# Patient Record
Sex: Female | Born: 1935 | Race: White | Hispanic: No | Smoking: Former smoker
Health system: Southern US, Community
[De-identification: ages and names within clinical notes are randomized; demographics above are authoritative.]

## PROBLEM LIST (undated history)

## (undated) DIAGNOSIS — I639 Cerebral infarction, unspecified: Secondary | ICD-10-CM

## (undated) DIAGNOSIS — I4891 Unspecified atrial fibrillation: Secondary | ICD-10-CM

## (undated) DIAGNOSIS — I513 Intracardiac thrombosis, not elsewhere classified: Secondary | ICD-10-CM

## (undated) DIAGNOSIS — J40 Bronchitis, not specified as acute or chronic: Secondary | ICD-10-CM

## (undated) DIAGNOSIS — I700 Atherosclerosis of aorta: Secondary | ICD-10-CM

## (undated) DIAGNOSIS — I2510 Atherosclerotic heart disease of native coronary artery without angina pectoris: Secondary | ICD-10-CM

## (undated) DIAGNOSIS — C50919 Malignant neoplasm of unspecified site of unspecified female breast: Secondary | ICD-10-CM

## (undated) DIAGNOSIS — R3581 Nocturnal polyuria: Secondary | ICD-10-CM

## (undated) HISTORY — DX: Cerebral infarction, unspecified: I63.9

## (undated) HISTORY — DX: Atherosclerosis of aorta: I70.0

## (undated) HISTORY — DX: Intracardiac thrombosis, not elsewhere classified: I51.3

## (undated) HISTORY — DX: Malignant neoplasm of unspecified site of unspecified female breast: C50.919

## (undated) HISTORY — DX: Unspecified atrial fibrillation: I48.91

## (undated) HISTORY — DX: Atherosclerotic heart disease of native coronary artery without angina pectoris: I25.10

---

## 1965-01-06 HISTORY — PX: TOTAL ABDOMINAL HYSTERECTOMY: SHX209

## 1999-01-07 HISTORY — PX: BREAST LUMPECTOMY: SHX2

## 2017-01-06 HISTORY — PX: FEMUR FRACTURE SURGERY: SHX633

## 2018-01-30 DIAGNOSIS — N3020 Other chronic cystitis without hematuria: Secondary | ICD-10-CM | POA: Insufficient documentation

## 2018-02-17 ENCOUNTER — Other Ambulatory Visit (HOSPITAL_COMMUNITY): Payer: Self-pay | Admitting: *Deleted

## 2018-02-18 ENCOUNTER — Encounter (HOSPITAL_COMMUNITY): Payer: Self-pay

## 2018-03-02 ENCOUNTER — Encounter (HOSPITAL_COMMUNITY): Payer: Self-pay

## 2018-03-05 ENCOUNTER — Other Ambulatory Visit (HOSPITAL_COMMUNITY): Payer: Self-pay | Admitting: *Deleted

## 2018-03-08 ENCOUNTER — Ambulatory Visit (HOSPITAL_COMMUNITY)
Admission: RE | Admit: 2018-03-08 | Discharge: 2018-03-08 | Disposition: A | Discharge status: Home / Self Care | Payer: Medicare Other | Source: Ambulatory Visit | Attending: Internal Medicine | Admitting: Internal Medicine

## 2018-03-08 DIAGNOSIS — M810 Age-related osteoporosis without current pathological fracture: Secondary | ICD-10-CM | POA: Insufficient documentation

## 2018-03-08 MED ORDER — DENOSUMAB 60 MG/ML ~~LOC~~ SOSY
PREFILLED_SYRINGE | SUBCUTANEOUS | Status: AC
  Administered 2018-03-08: 60 mg via SUBCUTANEOUS
  Filled 2018-03-08: qty 1

## 2018-03-08 MED ORDER — DENOSUMAB 60 MG/ML ~~LOC~~ SOSY
60.0000 mg | PREFILLED_SYRINGE | Freq: Once | SUBCUTANEOUS | Status: AC
  Administered 2018-03-08: 60 mg via SUBCUTANEOUS

## 2018-06-16 ENCOUNTER — Other Ambulatory Visit: Payer: Self-pay

## 2018-06-16 ENCOUNTER — Encounter (INDEPENDENT_AMBULATORY_CARE_PROVIDER_SITE_OTHER): Payer: Medicare Other | Admitting: Ophthalmology

## 2018-06-16 DIAGNOSIS — I10 Essential (primary) hypertension: Secondary | ICD-10-CM

## 2018-06-16 DIAGNOSIS — H35033 Hypertensive retinopathy, bilateral: Secondary | ICD-10-CM

## 2018-06-16 DIAGNOSIS — H43813 Vitreous degeneration, bilateral: Secondary | ICD-10-CM

## 2018-06-16 DIAGNOSIS — H353231 Exudative age-related macular degeneration, bilateral, with active choroidal neovascularization: Secondary | ICD-10-CM | POA: Diagnosis not present

## 2018-07-16 ENCOUNTER — Other Ambulatory Visit: Payer: Self-pay

## 2018-07-16 ENCOUNTER — Encounter (INDEPENDENT_AMBULATORY_CARE_PROVIDER_SITE_OTHER): Payer: Medicare Other | Admitting: Ophthalmology

## 2018-07-16 DIAGNOSIS — H353231 Exudative age-related macular degeneration, bilateral, with active choroidal neovascularization: Secondary | ICD-10-CM | POA: Diagnosis not present

## 2018-07-16 DIAGNOSIS — H35033 Hypertensive retinopathy, bilateral: Secondary | ICD-10-CM | POA: Diagnosis not present

## 2018-07-16 DIAGNOSIS — H43813 Vitreous degeneration, bilateral: Secondary | ICD-10-CM | POA: Diagnosis not present

## 2018-07-16 DIAGNOSIS — I10 Essential (primary) hypertension: Secondary | ICD-10-CM

## 2018-08-06 DIAGNOSIS — J300 Vasomotor rhinitis: Secondary | ICD-10-CM | POA: Insufficient documentation

## 2018-08-09 DIAGNOSIS — M1711 Unilateral primary osteoarthritis, right knee: Secondary | ICD-10-CM | POA: Insufficient documentation

## 2018-08-13 ENCOUNTER — Encounter (INDEPENDENT_AMBULATORY_CARE_PROVIDER_SITE_OTHER): Payer: Medicare Other | Admitting: Ophthalmology

## 2018-08-13 ENCOUNTER — Other Ambulatory Visit: Payer: Self-pay

## 2018-08-13 DIAGNOSIS — H43813 Vitreous degeneration, bilateral: Secondary | ICD-10-CM | POA: Diagnosis not present

## 2018-08-13 DIAGNOSIS — H353231 Exudative age-related macular degeneration, bilateral, with active choroidal neovascularization: Secondary | ICD-10-CM

## 2018-08-13 DIAGNOSIS — H35033 Hypertensive retinopathy, bilateral: Secondary | ICD-10-CM | POA: Diagnosis not present

## 2018-08-13 DIAGNOSIS — I10 Essential (primary) hypertension: Secondary | ICD-10-CM | POA: Diagnosis not present

## 2018-08-23 DIAGNOSIS — M25562 Pain in left knee: Secondary | ICD-10-CM | POA: Insufficient documentation

## 2018-08-23 DIAGNOSIS — M25561 Pain in right knee: Secondary | ICD-10-CM | POA: Insufficient documentation

## 2018-09-15 DIAGNOSIS — M1712 Unilateral primary osteoarthritis, left knee: Secondary | ICD-10-CM | POA: Insufficient documentation

## 2018-09-17 ENCOUNTER — Encounter (INDEPENDENT_AMBULATORY_CARE_PROVIDER_SITE_OTHER): Payer: Medicare Other | Admitting: Ophthalmology

## 2018-09-21 ENCOUNTER — Other Ambulatory Visit: Payer: Self-pay

## 2018-09-21 ENCOUNTER — Encounter (INDEPENDENT_AMBULATORY_CARE_PROVIDER_SITE_OTHER): Payer: Medicare Other | Admitting: Ophthalmology

## 2018-09-21 DIAGNOSIS — H35033 Hypertensive retinopathy, bilateral: Secondary | ICD-10-CM

## 2018-09-21 DIAGNOSIS — H43813 Vitreous degeneration, bilateral: Secondary | ICD-10-CM | POA: Diagnosis not present

## 2018-09-21 DIAGNOSIS — H353231 Exudative age-related macular degeneration, bilateral, with active choroidal neovascularization: Secondary | ICD-10-CM

## 2018-09-21 DIAGNOSIS — I10 Essential (primary) hypertension: Secondary | ICD-10-CM | POA: Diagnosis not present

## 2018-10-06 DIAGNOSIS — M545 Low back pain, unspecified: Secondary | ICD-10-CM | POA: Insufficient documentation

## 2018-11-02 ENCOUNTER — Other Ambulatory Visit: Payer: Self-pay

## 2018-11-02 ENCOUNTER — Encounter (INDEPENDENT_AMBULATORY_CARE_PROVIDER_SITE_OTHER): Payer: Medicare Other | Admitting: Ophthalmology

## 2018-11-02 DIAGNOSIS — H35033 Hypertensive retinopathy, bilateral: Secondary | ICD-10-CM

## 2018-11-02 DIAGNOSIS — H43813 Vitreous degeneration, bilateral: Secondary | ICD-10-CM

## 2018-11-02 DIAGNOSIS — I10 Essential (primary) hypertension: Secondary | ICD-10-CM | POA: Diagnosis not present

## 2018-11-02 DIAGNOSIS — H353231 Exudative age-related macular degeneration, bilateral, with active choroidal neovascularization: Secondary | ICD-10-CM | POA: Diagnosis not present

## 2018-12-15 ENCOUNTER — Other Ambulatory Visit: Payer: Self-pay

## 2018-12-15 ENCOUNTER — Encounter (INDEPENDENT_AMBULATORY_CARE_PROVIDER_SITE_OTHER): Payer: Medicare Other | Admitting: Ophthalmology

## 2018-12-15 DIAGNOSIS — H35033 Hypertensive retinopathy, bilateral: Secondary | ICD-10-CM

## 2018-12-15 DIAGNOSIS — H43813 Vitreous degeneration, bilateral: Secondary | ICD-10-CM | POA: Diagnosis not present

## 2018-12-15 DIAGNOSIS — I10 Essential (primary) hypertension: Secondary | ICD-10-CM | POA: Diagnosis not present

## 2018-12-15 DIAGNOSIS — H353231 Exudative age-related macular degeneration, bilateral, with active choroidal neovascularization: Secondary | ICD-10-CM | POA: Diagnosis not present

## 2018-12-27 ENCOUNTER — Other Ambulatory Visit (HOSPITAL_COMMUNITY): Payer: Self-pay | Admitting: *Deleted

## 2018-12-28 ENCOUNTER — Encounter (HOSPITAL_COMMUNITY): Payer: Medicare Other

## 2019-01-11 DIAGNOSIS — M7051 Other bursitis of knee, right knee: Secondary | ICD-10-CM | POA: Insufficient documentation

## 2019-01-27 ENCOUNTER — Encounter (HOSPITAL_COMMUNITY): Payer: Medicare Other

## 2019-02-09 ENCOUNTER — Encounter (INDEPENDENT_AMBULATORY_CARE_PROVIDER_SITE_OTHER): Payer: Medicare Other | Admitting: Ophthalmology

## 2019-02-09 DIAGNOSIS — I10 Essential (primary) hypertension: Secondary | ICD-10-CM | POA: Diagnosis not present

## 2019-02-09 DIAGNOSIS — H43813 Vitreous degeneration, bilateral: Secondary | ICD-10-CM | POA: Diagnosis not present

## 2019-02-09 DIAGNOSIS — H353231 Exudative age-related macular degeneration, bilateral, with active choroidal neovascularization: Secondary | ICD-10-CM

## 2019-02-09 DIAGNOSIS — H35033 Hypertensive retinopathy, bilateral: Secondary | ICD-10-CM | POA: Diagnosis not present

## 2019-03-01 ENCOUNTER — Other Ambulatory Visit: Payer: Self-pay

## 2019-03-01 ENCOUNTER — Ambulatory Visit (HOSPITAL_COMMUNITY)
Admission: RE | Admit: 2019-03-01 | Discharge: 2019-03-01 | Disposition: A | Discharge status: Home / Self Care | Payer: Medicare Other | Source: Ambulatory Visit | Attending: Internal Medicine | Admitting: Internal Medicine

## 2019-03-01 DIAGNOSIS — M810 Age-related osteoporosis without current pathological fracture: Secondary | ICD-10-CM | POA: Diagnosis present

## 2019-03-01 MED ORDER — DENOSUMAB 60 MG/ML ~~LOC~~ SOSY: 60.0000 mg | PREFILLED_SYRINGE | Freq: Once | SUBCUTANEOUS | Status: AC

## 2019-03-01 MED ORDER — DENOSUMAB 60 MG/ML ~~LOC~~ SOSY
PREFILLED_SYRINGE | SUBCUTANEOUS | Status: AC
  Administered 2019-03-01: 60 mg via SUBCUTANEOUS
  Filled 2019-03-01: qty 1

## 2019-04-20 ENCOUNTER — Encounter (INDEPENDENT_AMBULATORY_CARE_PROVIDER_SITE_OTHER): Payer: Medicare Other | Admitting: Ophthalmology

## 2019-04-20 DIAGNOSIS — I10 Essential (primary) hypertension: Secondary | ICD-10-CM | POA: Diagnosis not present

## 2019-04-20 DIAGNOSIS — H35033 Hypertensive retinopathy, bilateral: Secondary | ICD-10-CM

## 2019-04-20 DIAGNOSIS — H43813 Vitreous degeneration, bilateral: Secondary | ICD-10-CM

## 2019-04-20 DIAGNOSIS — H353231 Exudative age-related macular degeneration, bilateral, with active choroidal neovascularization: Secondary | ICD-10-CM

## 2019-05-31 DIAGNOSIS — M179 Osteoarthritis of knee, unspecified: Secondary | ICD-10-CM | POA: Insufficient documentation

## 2019-06-29 ENCOUNTER — Encounter (INDEPENDENT_AMBULATORY_CARE_PROVIDER_SITE_OTHER): Payer: Medicare Other | Admitting: Ophthalmology

## 2019-06-29 ENCOUNTER — Other Ambulatory Visit: Payer: Self-pay

## 2019-06-29 DIAGNOSIS — H353231 Exudative age-related macular degeneration, bilateral, with active choroidal neovascularization: Secondary | ICD-10-CM

## 2019-06-29 DIAGNOSIS — I10 Essential (primary) hypertension: Secondary | ICD-10-CM | POA: Diagnosis not present

## 2019-06-29 DIAGNOSIS — H43813 Vitreous degeneration, bilateral: Secondary | ICD-10-CM | POA: Diagnosis not present

## 2019-06-29 DIAGNOSIS — H35033 Hypertensive retinopathy, bilateral: Secondary | ICD-10-CM | POA: Diagnosis not present

## 2019-09-07 ENCOUNTER — Encounter (INDEPENDENT_AMBULATORY_CARE_PROVIDER_SITE_OTHER): Payer: Medicare Other | Admitting: Ophthalmology

## 2019-09-14 ENCOUNTER — Other Ambulatory Visit: Payer: Self-pay

## 2019-09-14 ENCOUNTER — Encounter (INDEPENDENT_AMBULATORY_CARE_PROVIDER_SITE_OTHER): Payer: Medicare Other | Admitting: Ophthalmology

## 2019-09-14 DIAGNOSIS — I10 Essential (primary) hypertension: Secondary | ICD-10-CM

## 2019-09-14 DIAGNOSIS — H35033 Hypertensive retinopathy, bilateral: Secondary | ICD-10-CM | POA: Diagnosis not present

## 2019-09-14 DIAGNOSIS — H353130 Nonexudative age-related macular degeneration, bilateral, stage unspecified: Secondary | ICD-10-CM | POA: Diagnosis not present

## 2019-09-14 DIAGNOSIS — H43813 Vitreous degeneration, bilateral: Secondary | ICD-10-CM

## 2019-09-28 DIAGNOSIS — M419 Scoliosis, unspecified: Secondary | ICD-10-CM | POA: Insufficient documentation

## 2019-11-10 ENCOUNTER — Other Ambulatory Visit: Payer: Self-pay

## 2019-11-10 ENCOUNTER — Encounter (INDEPENDENT_AMBULATORY_CARE_PROVIDER_SITE_OTHER): Payer: Medicare Other | Admitting: Ophthalmology

## 2019-11-10 DIAGNOSIS — H43813 Vitreous degeneration, bilateral: Secondary | ICD-10-CM

## 2019-11-10 DIAGNOSIS — H353231 Exudative age-related macular degeneration, bilateral, with active choroidal neovascularization: Secondary | ICD-10-CM | POA: Diagnosis not present

## 2019-11-10 DIAGNOSIS — I10 Essential (primary) hypertension: Secondary | ICD-10-CM

## 2019-11-10 DIAGNOSIS — H35033 Hypertensive retinopathy, bilateral: Secondary | ICD-10-CM

## 2019-11-15 ENCOUNTER — Other Ambulatory Visit (HOSPITAL_COMMUNITY): Payer: Self-pay | Admitting: *Deleted

## 2019-11-16 ENCOUNTER — Other Ambulatory Visit: Payer: Self-pay

## 2019-11-16 ENCOUNTER — Ambulatory Visit (HOSPITAL_COMMUNITY)
Admission: RE | Admit: 2019-11-16 | Discharge: 2019-11-16 | Disposition: A | Discharge status: Home / Self Care | Payer: Medicare Other | Source: Ambulatory Visit | Attending: Internal Medicine | Admitting: Internal Medicine

## 2019-11-16 DIAGNOSIS — M810 Age-related osteoporosis without current pathological fracture: Secondary | ICD-10-CM | POA: Insufficient documentation

## 2019-11-16 MED ORDER — DENOSUMAB 60 MG/ML ~~LOC~~ SOSY
PREFILLED_SYRINGE | SUBCUTANEOUS | Status: AC
  Filled 2019-11-16: qty 1

## 2019-11-16 MED ORDER — DENOSUMAB 60 MG/ML ~~LOC~~ SOSY
60.0000 mg | PREFILLED_SYRINGE | Freq: Once | SUBCUTANEOUS | Status: AC
  Administered 2019-11-16: 60 mg via SUBCUTANEOUS

## 2020-01-17 ENCOUNTER — Encounter (INDEPENDENT_AMBULATORY_CARE_PROVIDER_SITE_OTHER): Payer: Medicare Other | Admitting: Ophthalmology

## 2020-01-17 ENCOUNTER — Other Ambulatory Visit: Payer: Self-pay

## 2020-01-17 DIAGNOSIS — H43813 Vitreous degeneration, bilateral: Secondary | ICD-10-CM

## 2020-01-17 DIAGNOSIS — H353231 Exudative age-related macular degeneration, bilateral, with active choroidal neovascularization: Secondary | ICD-10-CM | POA: Diagnosis not present

## 2020-01-17 DIAGNOSIS — H35033 Hypertensive retinopathy, bilateral: Secondary | ICD-10-CM

## 2020-01-17 DIAGNOSIS — I10 Essential (primary) hypertension: Secondary | ICD-10-CM | POA: Diagnosis not present

## 2020-01-19 ENCOUNTER — Encounter (INDEPENDENT_AMBULATORY_CARE_PROVIDER_SITE_OTHER): Payer: Medicare Other | Admitting: Ophthalmology

## 2020-03-12 ENCOUNTER — Emergency Department (HOSPITAL_COMMUNITY): Payer: Medicare Other

## 2020-03-12 ENCOUNTER — Ambulatory Visit (HOSPITAL_COMMUNITY)
Admission: EM | Admit: 2020-03-12 | Discharge: 2020-03-12 | Disposition: A | Discharge status: Home / Self Care | Payer: Medicare Other

## 2020-03-12 ENCOUNTER — Other Ambulatory Visit: Payer: Self-pay

## 2020-03-12 ENCOUNTER — Emergency Department (HOSPITAL_COMMUNITY)
Admission: EM | Admit: 2020-03-12 | Discharge: 2020-03-12 | Disposition: A | Discharge status: Home / Self Care | Payer: Medicare Other | Attending: Emergency Medicine | Admitting: Emergency Medicine

## 2020-03-12 DIAGNOSIS — W1830XA Fall on same level, unspecified, initial encounter: Secondary | ICD-10-CM | POA: Insufficient documentation

## 2020-03-12 DIAGNOSIS — F0390 Unspecified dementia without behavioral disturbance: Secondary | ICD-10-CM | POA: Diagnosis not present

## 2020-03-12 DIAGNOSIS — Z0489 Encounter for examination and observation for other specified reasons: Secondary | ICD-10-CM | POA: Insufficient documentation

## 2020-03-12 DIAGNOSIS — W19XXXA Unspecified fall, initial encounter: Secondary | ICD-10-CM

## 2020-03-12 DIAGNOSIS — Y92002 Bathroom of unspecified non-institutional (private) residence single-family (private) house as the place of occurrence of the external cause: Secondary | ICD-10-CM | POA: Insufficient documentation

## 2020-03-12 DIAGNOSIS — Z7901 Long term (current) use of anticoagulants: Secondary | ICD-10-CM | POA: Diagnosis not present

## 2020-03-12 NOTE — ED Triage Notes (Signed)
Pt from home, had mechanical fall last night and did hit head. On blood thinners. Hx of dementia, is not more confused than normal, is at her baseline per family. Went to her orthopedic MD for another issue today and was advised to come to ED.

## 2020-03-12 NOTE — ED Notes (Signed)
Patient transported to CT 

## 2020-03-12 NOTE — ED Provider Notes (Signed)
[facility] EMERGENCY DEPARTMENT Provider Note   CSN: [id number] Arrival date & time: 03/12/20  1201     History Chief Complaint  Patient presents with  . Bridget [name] is a 87 y.o. female.  The history is provided by a relative.   Level 5 caveat: Dementia  87 year old female on Pradaxa, presenting to the ED from orthopedic office after a fall. History provided by patient's daughter at bedside as patient with fairly severe dementia.  States she got up to go to the bathroom around 2AM this morning and had a fall in the bathroom.  She uses walked usually when up and walking, not sure if she was using it last night or not.  Daughter noticed a "knot" on the back of her head and applied ice which seemed to help.  No noted wounds/laceration.  States during the night she seemed a little confused but this morning she has been at her baseline.  She has eaten breakfast, drank water, walked around, usual activities.  She has not had any vomiting.  Daughter reports she had appointment at orthopedic office this morning for follow-up of cortisone injection in right leg.  They were sent here for CT of the head as she is on Pradaxa.  Daughter reports she remains at her baseline currently.  No past medical history on file.  There are no problems to display for this patient.    OB History   No obstetric history on file.     No family history on file.     Home Medications Prior to Admission medications   Not on File    Allergies    Erythromycin, Oxycodone, and Tramadol  Review of Systems   Review of Systems  Unable to perform ROS: Dementia    Physical Exam Updated Vital Signs BP 128/62 (BP Location: Right Arm)   Pulse 71   Temp 97.9 F (36.6 C) (Oral)   Resp 16   SpO2 92%   Physical Exam Vitals and nursing note reviewed.  Constitutional:      Appearance: She is well-developed and well-nourished.  HENT:     Head: Normocephalic and atraumatic.      Comments: No visible head trauma, no significant hematoma or skull depression    Mouth/Throat:     Mouth: Oropharynx is clear and moist.  Eyes:     Extraocular Movements: EOM normal.     Conjunctiva/sclera: Conjunctivae normal.     Pupils: Pupils are equal, round, and reactive to light.  Cardiovascular:     Rate and Rhythm: Normal rate and regular rhythm.     Heart sounds: Normal heart sounds.  Pulmonary:     Effort: Pulmonary effort is normal.     Breath sounds: Normal breath sounds.  Abdominal:     General: Bowel sounds are normal.     Palpations: Abdomen is soft.  Musculoskeletal:        General: Normal range of motion.     Cervical back: Normal range of motion.  Skin:    General: Skin is warm and dry.  Neurological:     Mental Status: She is alert and oriented to person, place, and time.  Psychiatric:        Mood and Affect: Mood and affect normal.     ED Results / Procedures / Treatments   Labs (all labs ordered are listed, but only abnormal results are displayed) Labs Reviewed - No data to display  EKG None  Radiology CT Head Wo Contrast  Result Date: 03/12/2020 CLINICAL DATA:  Status post fall. EXAM: CT HEAD WITHOUT CONTRAST TECHNIQUE: Contiguous axial images were obtained from the base of the skull through the vertex without intravenous contrast. COMPARISON:  None. FINDINGS: Brain: There is mild cerebral atrophy with widening of the extra-axial spaces and ventricular dilatation. There are areas of decreased attenuation within the white matter tracts of the supratentorial brain, consistent with microvascular disease changes. Areas of encephalomalacia, with adjacent chronic white matter low attenuation, are seen within the bilateral frontal lobes, right larger than left. Vascular: No hyperdense vessel or unexpected calcification. Skull: Normal. Negative for fracture or focal lesion. Sinuses/Orbits: No acute finding. Other: None. IMPRESSION: 1. Generalized cerebral atrophy.  2. Chronic bilateral frontal lobe infarcts. 3. No acute intracranial abnormality. Electronically Signed   By: Virgina Norfolk M.D.   On: 03/12/2020 14:06    Procedures Procedures   Medications Ordered in ED Medications - No data to display  ED Course  I have reviewed the triage vital signs and the nursing notes.  Pertinent labs & imaging results that were available during my care of the patient were reviewed by me and considered in my medical decision making (see chart for details).    MDM Rules/Calculators/A&P  87 year old female presenting to the ED after fall.  Fell in the bathroom around 2 AM this morning.  Uses walker at baseline, daughter not sure if she was using this.  Patient with history of dementia.  Went to orthopedic appointment this morning and was sent here for CT of the head as she is on Pradaxa.  Daughter report patient is at her baseline.  She is awake, alert, able to answer simple questions and follow commands.  She does not have any significant signs of head trauma, no hematoma or skull depression.  Her vitals are stable.  CT of the head obtained, chronic findings but no acute intracranial abnormality.  Patient reassessed, remains at her baseline.  Results discussed with daughter.  Stable for discharge.  Can follow-up as an outpatient.  Return here for any new or acute changes.  Final Clinical Impression(s) / ED Diagnoses Final diagnoses:  Fall, initial encounter    Rx / DC Orders ED Discharge Orders    None       Larene Pickett, PA-C 03/12/20 1432    Lennice Sites, DO 03/13/20 [phone]

## 2020-03-12 NOTE — Discharge Instructions (Signed)
Head CT today was normal.   Close follow-up with your primary care doctor. Return here for any new/acute changes.

## 2020-03-12 NOTE — ED Notes (Signed)
Needs to be dismiss, family member understood we hand CT at this location, as she was sent to the ED by the PCP for CT scan due to fall.

## 2020-03-22 ENCOUNTER — Emergency Department (HOSPITAL_COMMUNITY)
Admission: EM | Admit: 2020-03-22 | Discharge: 2020-03-22 | Disposition: A | Discharge status: Home / Self Care | Payer: Medicare Other | Attending: Emergency Medicine | Admitting: Emergency Medicine

## 2020-03-22 ENCOUNTER — Emergency Department (HOSPITAL_COMMUNITY): Payer: Medicare Other

## 2020-03-22 ENCOUNTER — Encounter (HOSPITAL_COMMUNITY): Payer: Self-pay | Admitting: Emergency Medicine

## 2020-03-22 ENCOUNTER — Other Ambulatory Visit: Payer: Self-pay

## 2020-03-22 DIAGNOSIS — Y92002 Bathroom of unspecified non-institutional (private) residence single-family (private) house as the place of occurrence of the external cause: Secondary | ICD-10-CM | POA: Diagnosis not present

## 2020-03-22 DIAGNOSIS — Z7901 Long term (current) use of anticoagulants: Secondary | ICD-10-CM | POA: Insufficient documentation

## 2020-03-22 DIAGNOSIS — R296 Repeated falls: Secondary | ICD-10-CM

## 2020-03-22 DIAGNOSIS — R350 Frequency of micturition: Secondary | ICD-10-CM | POA: Diagnosis not present

## 2020-03-22 DIAGNOSIS — S61411A Laceration without foreign body of right hand, initial encounter: Secondary | ICD-10-CM | POA: Insufficient documentation

## 2020-03-22 DIAGNOSIS — W01198A Fall on same level from slipping, tripping and stumbling with subsequent striking against other object, initial encounter: Secondary | ICD-10-CM | POA: Insufficient documentation

## 2020-03-22 DIAGNOSIS — S0003XA Contusion of scalp, initial encounter: Secondary | ICD-10-CM

## 2020-03-22 DIAGNOSIS — M25552 Pain in left hip: Secondary | ICD-10-CM | POA: Insufficient documentation

## 2020-03-22 DIAGNOSIS — S6991XA Unspecified injury of right wrist, hand and finger(s), initial encounter: Secondary | ICD-10-CM | POA: Diagnosis present

## 2020-03-22 NOTE — ED Provider Notes (Signed)
[facility] EMERGENCY DEPARTMENT Provider Note   CSN: [id number] Arrival date & time: 03/22/20  0022     History Chief Complaint  Patient presents with  . [place] is a 87 y.o. female.  HPI     This is a 87 year old female on Pradaxa who presents following a fall.  Patient reportedly fell tonight after getting up to go the bathroom.  She remembers falling.  She cannot tell me exactly why she fell but did not lose consciousness.  Daughter reports that she was up to the bathroom for the third time since going to sleep.  She did not hear her get up but did hear her fall.  She did not lose consciousness.  She noted a hematoma to the posterior scalp.  She also noted a skin tear to the right hand.  Patient was able to bear weight and ambulate but was complaining of left buttock and hip pain.  Daughter reports that she fell last week as well.  Increased recent history of falls.  Daughter reports that she is otherwise been at her baseline.  No fevers, cough, infectious symptoms.  She does question whether she might have a urinary tract infection given frequent bathroom visits during the night.  History reviewed. No pertinent past medical history.  There are no problems to display for this patient.   History reviewed. No pertinent surgical history.   OB History   No obstetric history on file.     No family history on file.     Home Medications Prior to Admission medications   Not on File    Allergies    Erythromycin, Oxycodone, and Tramadol  Review of Systems   Review of Systems  Constitutional: Negative for fever.  Respiratory: Negative for shortness of breath.   Cardiovascular: Negative for chest pain.  Gastrointestinal: Negative for abdominal pain, nausea and vomiting.  Genitourinary: Positive for frequency.  Skin: Positive for wound.  All other systems reviewed and are negative.   Physical Exam Updated Vital Signs BP (!) 189/86 (BP  Location: Right Arm)   Pulse 72   Temp 97.9 F (36.6 C)   Resp 16   Ht 1.702 m (5\' 7" )   Wt 45.4 kg   SpO2 99%   BMI 15.66 kg/m   Physical Exam Vitals and nursing note reviewed.  Constitutional:      Appearance: She is well-developed.     Comments: Elderly, nontoxic-appearing  HENT:     Head: Normocephalic.     Comments: Hematoma posterior scalp, no laceration or bleeding    Nose: Nose normal.     Mouth/Throat:     Mouth: Mucous membranes are moist.  Eyes:     Pupils: Pupils are equal, round, and reactive to light.  Neck:     Comments: No midline C-spine tenderness palpation, step-off, deformity Cardiovascular:     Rate and Rhythm: Normal rate and regular rhythm.     Heart sounds: Normal heart sounds.  Pulmonary:     Effort: Pulmonary effort is normal. No respiratory distress.     Breath sounds: No wheezing.  Abdominal:     General: Bowel sounds are normal.     Palpations: Abdomen is soft.     Tenderness: There is no abdominal tenderness.  Musculoskeletal:     Cervical back: Neck supple.     Comments: Normal range of motion of the bilateral hips and knees, no obvious deformity, tenderness palpation left buttock and hip  region  Skin:    General: Skin is warm and dry.     Comments: Skin tear dorsum of right hand, no obvious deformities or swelling  Neurological:     Mental Status: She is alert and oriented to person, place, and time.  Psychiatric:        Mood and Affect: Mood normal.     ED Results / Procedures / Treatments   Labs (all labs ordered are listed, but only abnormal results are displayed) Labs Reviewed  URINALYSIS, ROUTINE W REFLEX MICROSCOPIC    EKG None  Radiology DG Sacrum/Coccyx  Result Date: 03/22/2020 CLINICAL DATA:  Status post fall. EXAM: SACRUM AND COCCYX - 2+ VIEW. Per x-ray tech difficulty positioning patient. COMPARISON:  X-ray hip 03/22/2020 FINDINGS: Visualized arcuate lines of the sacrum on frontal view are unremarkable. Otherwise  the sacrum and coccyx are not visualized on this study due to positioning of patient and overlying bowel gas. Multilevel severe degenerative changes of the lumbar spine with likely vertebral body height loss of the T12 through L3 levels. At least grade 1 anterolisthesis of L5 on S1. Partially visualized bilateral femoral surgical hardware. Severe atherosclerotic plaque of the aorta and its branches. Inferior vena cava filter noted at the level of the L4 vertebral body level. IMPRESSION: 1. Nondiagnostic radiographs. The sacrum and coccyx are not well visualized on this study due to positioning (most of the sacrum coccyx are common off view) and overlying bowel gas. 2. Multilevel severe degenerative changes of the lumbar spine with likely vertebral body height loss of the T12 through L3 levels. At least grade 1 anterolisthesis of L5 on S1. Electronically Signed   By: Iven Finn M.D.   On: 03/22/2020 05:27   CT Head Wo Contrast  Result Date: 03/22/2020 CLINICAL DATA:  [place], hit head, dimension EXAM: CT HEAD WITHOUT CONTRAST TECHNIQUE: Contiguous axial images were obtained from the base of the skull through the vertex without intravenous contrast. COMPARISON:  03/12/2020 FINDINGS: Brain: Chronic ischemic changes are seen from bilateral frontal cortical infarct. Chronic small vessel ischemic changes are also seen throughout the periventricular white matter bilateral basal ganglia. These findings are stable since prior exam. No acute infarct or hemorrhage. Lateral ventricles and remaining midline structures are unremarkable. No acute extra-axial fluid collections. No mass effect. Vascular: Stable atherosclerosis.  No hyperdense vessel. Skull: Normal. Negative for fracture or focal lesion. Sinuses/Orbits: No acute finding. Other: None. IMPRESSION: 1. Stable chronic ischemic changes throughout the white matter, basal ganglia, and bilateral frontal lobes. 2. No acute intracranial process. Electronically Signed    By: Randa Ngo M.D.   On: 03/22/2020 01:34   CT Cervical Spine Wo Contrast  Result Date: 03/22/2020 CLINICAL DATA:  [place], hit head, dimension EXAM: CT CERVICAL SPINE WITHOUT CONTRAST TECHNIQUE: Multidetector CT imaging of the cervical spine was performed without intravenous contrast. Multiplanar CT image reconstructions were also generated. COMPARISON:  None. FINDINGS: Alignment: Slight reversal cervical lordosis centered at C3-4, likely due to prominent spondylosis throughout the cervical spine. Otherwise alignment is anatomic. Skull base and vertebrae: No acute fracture. No primary bone lesion or focal pathologic process. Soft tissues and spinal canal: No prevertebral fluid or swelling. No visible canal hematoma. Disc levels: There is diffuse multilevel spondylosis greatest at C3-4 and C4-5. Mild facet hypertrophic changes are identified, left predominant at C2-3 and C3-4. Left predominant neural foraminal narrowing at C3-4 and C4-5. Upper chest: Airway is patent.  Lung apices are clear. Other: Reconstructed images demonstrate no additional findings. IMPRESSION:  1. No acute cervical spine fracture. 2. Multilevel cervical spondylosis and facet hypertrophy, most pronounced at C3-4 and C4-5. Electronically Signed   By: Randa Ngo M.D.   On: 03/22/2020 01:36   DG Hand Complete Right  Result Date: 03/22/2020 CLINICAL DATA:  [place] at home EXAM: RIGHT HAND - COMPLETE 3+ VIEW COMPARISON:  None. FINDINGS: Vascular calcifications. No acute displaced fracture or malalignment. Arthritis at the IP joints and first and second MCP joints. Advanced arthritis at the first [facility] joint and STT interval. Lucent benign appearing lesions within the lunate bone and base of third proximal phalanx. IMPRESSION: 1. No acute osseous abnormality. 2. Arthritis of the hand and wrist. Electronically Signed   By: Donavan Foil M.D.   On: 03/22/2020 01:27   DG Hip Unilat W or Wo Pelvis 2-3 Views Left  Result Date:  03/22/2020 CLINICAL DATA:  Fall EXAM: DG HIP (WITH OR WITHOUT PELVIS) 2-3V LEFT COMPARISON:  None. FINDINGS: IVC filter to the right of L3-L4. SI joints are non widened. Pubic symphysis and rami appear intact. Partially visualized right intramedullary femoral rod with chronic trochanteric fracture deformity. Intramedullary rod left femur with distal fixating screw. No fracture or malalignment is seen. IMPRESSION: No acute osseous abnormality.  Surgical hardware within both femurs. Electronically Signed   By: Donavan Foil M.D.   On: 03/22/2020 01:29    Procedures Procedures   Medications Ordered in ED Medications - No data to display  ED Course  I have reviewed the triage vital signs and the nursing notes.  Pertinent labs & imaging results that were available during my care of the patient were reviewed by me and considered in my medical decision making (see chart for details).    MDM Rules/Calculators/A&P                          Patient presents with recurrent fall.  Noted a hematoma to the posterior scalp.  She is on Pradaxa for atrial fibrillation.  She is overall nontoxic and vital signs are notable for blood pressure 189/86.  She denies any syncope.  She cannot describe the fall but has had several recurrent falls recently.  X-rays obtained show no acute fracture.  She has had some urinary frequency and her daughter is concerned regarding possible UTI.  Unfortunately, she was unable to provide a urine sample.  Daughter declined in and out cath because she has a history of UTIs following catheterization in the past.  Daughter reports that she had contacted her urologist to be seen and prefers following up as an outpatient.  I discussed with her the importance of following up to obtain a clean urine sample in order to rule out UTI.  Also discussed that she needs to be monitored and assisted with ambulation.  Daughter reports that she has good support at home.  After history, exam, and medical  workup I feel the patient has been appropriately medically screened and is safe for discharge home. Pertinent diagnoses were discussed with the patient. Patient was given return precautions.  Final Clinical Impression(s) / ED Diagnoses Final diagnoses:  Recurrent falls  Hematoma of scalp, initial encounter  Urinary frequency    Rx / DC Orders ED Discharge Orders    None       Chey Cho, Barbette Hair, MD 03/22/20 [phone]

## 2020-03-22 NOTE — Discharge Instructions (Addendum)
You are seen today after a fall.  There is no evidence of traumatic injury.  Apply ice to the hematoma to the scalp.  Given her urinary frequency, it is very important you follow-up with her urologist obtain a urine sample to rule out UTI.  Make sure that she is not getting up unassisted at home.

## 2020-03-22 NOTE — ED Notes (Signed)
Pt unable to provide urine sample at this time and is refusing In and out cath. EDP has been notified.

## 2020-03-22 NOTE — ED Triage Notes (Signed)
Pt from home, daughter reports pt had a fall getting up to the bathroom. Daughter states she hit her head, currently on pradaxa, pt also has a skin tear to her right hand. Hx dementia.

## 2020-03-22 NOTE — Progress Notes (Signed)
Orthopedic Tech Progress Note Patient Details:  Bridget [name] September 25, 1921 [id number] Level 2 Trauma  Patient ID: [name], female   DOB: [birth year], 87 y.o.   MRN: [id number]   Jearld Lesch 03/22/2020, 12:42 AM

## 2020-04-02 ENCOUNTER — Other Ambulatory Visit: Payer: Self-pay

## 2020-04-02 ENCOUNTER — Encounter (INDEPENDENT_AMBULATORY_CARE_PROVIDER_SITE_OTHER): Payer: Medicare Other | Admitting: Ophthalmology

## 2020-04-02 DIAGNOSIS — H43813 Vitreous degeneration, bilateral: Secondary | ICD-10-CM

## 2020-04-02 DIAGNOSIS — H353231 Exudative age-related macular degeneration, bilateral, with active choroidal neovascularization: Secondary | ICD-10-CM

## 2020-04-02 DIAGNOSIS — H35033 Hypertensive retinopathy, bilateral: Secondary | ICD-10-CM | POA: Diagnosis not present

## 2020-04-02 DIAGNOSIS — I10 Essential (primary) hypertension: Secondary | ICD-10-CM | POA: Diagnosis not present

## 2020-05-21 ENCOUNTER — Encounter (INDEPENDENT_AMBULATORY_CARE_PROVIDER_SITE_OTHER): Payer: Medicare Other | Admitting: Ophthalmology

## 2020-05-21 ENCOUNTER — Other Ambulatory Visit: Payer: Self-pay

## 2020-05-21 DIAGNOSIS — H35033 Hypertensive retinopathy, bilateral: Secondary | ICD-10-CM | POA: Diagnosis not present

## 2020-05-21 DIAGNOSIS — H43813 Vitreous degeneration, bilateral: Secondary | ICD-10-CM | POA: Diagnosis not present

## 2020-05-21 DIAGNOSIS — I10 Essential (primary) hypertension: Secondary | ICD-10-CM

## 2020-05-21 DIAGNOSIS — H353231 Exudative age-related macular degeneration, bilateral, with active choroidal neovascularization: Secondary | ICD-10-CM

## 2020-07-13 ENCOUNTER — Encounter (INDEPENDENT_AMBULATORY_CARE_PROVIDER_SITE_OTHER): Payer: Medicare Other | Admitting: Ophthalmology

## 2020-07-13 ENCOUNTER — Other Ambulatory Visit: Payer: Self-pay

## 2020-07-13 DIAGNOSIS — H43813 Vitreous degeneration, bilateral: Secondary | ICD-10-CM

## 2020-07-13 DIAGNOSIS — I10 Essential (primary) hypertension: Secondary | ICD-10-CM | POA: Diagnosis not present

## 2020-07-13 DIAGNOSIS — H353231 Exudative age-related macular degeneration, bilateral, with active choroidal neovascularization: Secondary | ICD-10-CM | POA: Diagnosis not present

## 2020-07-13 DIAGNOSIS — H35033 Hypertensive retinopathy, bilateral: Secondary | ICD-10-CM

## 2020-08-13 ENCOUNTER — Encounter (INDEPENDENT_AMBULATORY_CARE_PROVIDER_SITE_OTHER): Payer: Medicare Other | Admitting: Ophthalmology

## 2020-08-17 ENCOUNTER — Ambulatory Visit
Admission: RE | Admit: 2020-08-17 | Discharge: 2020-08-17 | Disposition: A | Discharge status: Home / Self Care | Payer: Medicare Other | Source: Ambulatory Visit | Attending: Registered Nurse | Admitting: Registered Nurse

## 2020-08-17 ENCOUNTER — Other Ambulatory Visit: Payer: Self-pay | Admitting: Registered Nurse

## 2020-08-17 DIAGNOSIS — J479 Bronchiectasis, uncomplicated: Secondary | ICD-10-CM

## 2020-08-17 MED ORDER — IOPAMIDOL (ISOVUE-300) INJECTION 61%
75.0000 mL | Freq: Once | INTRAVENOUS | Status: AC | PRN
  Administered 2020-08-17: 75 mL via INTRAVENOUS

## 2020-08-27 NOTE — Progress Notes (Signed)
Date:  08/28/2020   ID:  META KROENKE, DOB 07-Sep-1921, MRN [id number]  PCP:  Haywood Pao, MD  Cardiologist:  Rex Kras, DO, [facility]  (established care 08/28/2020)  REASON FOR CONSULT: Atrial fibrillation and left atrial appendage thrombus on CT scan  REQUESTING PHYSICIAN:  Tisovec, Fransico Him, MD [address] [place],  [place] [address]  Chief Complaint  Patient presents with   Atrial Fibrillation   New Patient (Initial Visit)    HPI  CURRY DULSKI is a 87 y.o. female who presents to the office with a chief complaint of " atrial fibrillation management and left atrial appendage thrombus on CT scan." Patient's past medical history and cardiovascular risk factors include: Atrial fibrillation (permanent, per daughter), hx of stroke (per daughter in 70), history of breast cancer, osteoporosis bilateral hip fractures, dementia (per daughter), thrombus of the left atrial appendage (CT scan ), pulmonary nodule, advanced age, post menopausal female.   She is referred to the office at the request of Tisovec, Fransico Him, MD for evaluation of atrial fibrillation.  Patient is accompanied by her daughter Kennyth Lose at today's visit.  And given the patient's underlying dementia history of present illness is obtained by her daughter.  Patient recently had moved from [place] to [place] to be closer to family and has been experiencing a chronic cough for which she underwent CT scan for further evaluation and management.  Results of the CT scan along with images reviewed independently at today's office visit and she is noted to have a left atrial appendage thrombus and now referred to cardiology for further evaluation and management.  Patient's daughter states that she was diagnosed with atrial fibrillation back in [place] more than 10 years ago.  She has no prior history of direct-current cardioversion or atrial fibrillation ablation to the best of her knowledge.  No prior history of intracranial  or gastrointestinal bleeding.  She has been on oral anticoagulation for the last 10 years.  She does not endorse any evidence of bleeding.  Since January 2022 patient may have skipped a total of 5 doses of Pradaxa according to her daughter Kennyth Lose.  No known history of malignancies noted to have pulmonary nodules on recent CT and has an upcoming office visit with pulmonary medicine.  FUNCTIONAL STATUS: Works with physical therapy for 45 minutes 3 times a week and on colder days and goes out for a walk with her daughter Kennyth Lose.  ALLERGIES: Allergies  Allergen Reactions   Erythromycin     Unknown, but "all the mycins bother me"   Oxycodone Other (See Comments)    confusion   Tramadol Other (See Comments)    confusion    MEDICATION LIST PRIOR TO VISIT: Current Meds  Medication Sig   acetaminophen (TYLENOL) 500 MG tablet Take 1 tablet by mouth daily.   Cranberry 250 MG CAPS Take 1 capsule by mouth daily.   Cyanocobalamin (B-12) 1000 MCG TABS Take 1 tablet by mouth daily.   dabigatran (PRADAXA) 150 MG CAPS capsule Take 1 capsule by mouth 2 (two) times daily.   denosumab (PROLIA) 60 MG/ML SOSY injection Inject 60 mg into the skin See admin instructions. Every 6 months   diclofenac Sodium (VOLTAREN) 1 % GEL Apply topically 4 (four) times daily.   digoxin (LANOXIN) 0.125 MG tablet Take 0.5 tablets by mouth daily.   diltiazem (TIAZAC) 180 MG 24 hr capsule Take 180 mg by mouth daily.   docusate sodium (COLACE) 100 MG capsule as needed.  ergocalciferol (VITAMIN D2) 1.25 MG (50000 UT) capsule Take 1 capsule by mouth daily.   escitalopram (LEXAPRO) 10 MG tablet Take 10 mg by mouth daily.   ketoconazole (NIZORAL) 2 % shampoo See admin instructions.   Lactobacillus (ACIDOPHILUS PROBIOTIC) 100 MG CAPS Take 1 capsule by mouth daily.   memantine (NAMENDA) 10 MG tablet Take 1 tablet by mouth 2 (two) times daily.   Multiple Vitamins-Minerals (PRESERVISION AREDS 2) CAPS Take 1 capsule by mouth in the  morning and at bedtime.     PAST MEDICAL HISTORY: Past Medical History:  Diagnosis Date   Atherosclerosis of aorta (HCC)    Atrial fibrillation ([place])    Breast cancer ([place])    Coronary atherosclerosis due to calcified coronary lesion    Stroke ([place])    Thrombus of left atrial appendage     PAST SURGICAL HISTORY: Past Surgical History:  Procedure Laterality Date   BREAST LUMPECTOMY Left 2001   FEMUR FRACTURE SURGERY Right 2019   RIGHT - 2015   TOTAL ABDOMINAL HYSTERECTOMY  1967    FAMILY HISTORY: The patient family history includes Other in her sister.  SOCIAL HISTORY:  The patient  reports that she quit smoking about 72 years ago. Her smoking use included cigarettes. She has never used smokeless tobacco. She reports that she does not drink alcohol and does not use drugs.  REVIEW OF SYSTEMS: Review of Systems  Constitutional: Negative for chills and fever.  HENT:  Negative for hoarse voice and nosebleeds.   Eyes:  Negative for discharge, double vision and pain.  Cardiovascular:  Negative for chest pain, claudication, dyspnea on exertion, leg swelling, near-syncope, orthopnea, palpitations, paroxysmal nocturnal dyspnea and syncope.  Respiratory:  Negative for hemoptysis and shortness of breath.   Musculoskeletal:  Negative for muscle cramps and myalgias.  Gastrointestinal:  Negative for abdominal pain, constipation, diarrhea, hematemesis, hematochezia, melena, nausea and vomiting.  Neurological:  Negative for dizziness and light-headedness.   PHYSICAL EXAM: Vitals with BMI 08/28/2020 03/22/2020 03/22/2020  Height $Remov'5\' 7"'FPKUcO$  - $'5\' 7"'c$   Weight 102 lbs - 100 lbs  BMI 37.34 - 28.76  Systolic 811 572 -  Diastolic 57 86 -  Pulse 81 70 -    CONSTITUTIONAL: Age-appropriate female, hemodynamically stable, well-developed and well-nourished. No acute distress.  SKIN: Skin is warm and dry. No rash noted. No cyanosis. No pallor. No jaundice HEAD: Normocephalic and atraumatic.  EYES: No  scleral icterus MOUTH/THROAT: Moist oral membranes.  NECK: No JVD present. No thyromegaly noted. No carotid bruits  LYMPHATIC: No visible cervical adenopathy.  CHEST Normal respiratory effort. No intercostal retractions  LUNGS: Clear to auscultation bilaterally.  No stridor. No wheezes. No rales.  CARDIOVASCULAR: Irregularly irregular, soft holosystolic murmur heard at the apex, variable S1-S2, no gallops or rubs. ABDOMINAL: Soft, nontender, nondistended, positive bowel sounds in all 4 quadrants, no apparent ascites.  EXTREMITIES: No peripheral edema, 2+ DP and PT pulses. HEMATOLOGIC: No significant bruising NEUROLOGIC: Oriented to person, place, and time. Nonfocal. Normal muscle tone.  PSYCHIATRIC: Normal mood and affect. Normal behavior. Cooperative  RADIOLOGY: CT chest with contrast: 08/17/2020 IMPRESSION: 1. Scattered areas of bronchiectasis are noted in the lungs bilaterally with associated chronic scarring/atelectasis in the right middle lobe. Notably, there also 2 large pulmonary nodules in the posterior aspect of the left lower lobe. These are favored to be of infectious or inflammatory etiology, but underlying neoplasm is difficult to entirely exclude. Accordingly, short-term repeat chest CT is recommended 1-2 months to ensure the stability or regression  of these findings. 2. Cardiomegaly with biatrial dilatation. Notably, there is a large filling defect in the tip of the left atrial appendage highly concerning for left atrial appendage thrombus. If present, this places the patient at high risk for systemic embolization. Further evaluation with transesophageal echocardiography is recommended if clinically appropriate. 3. Trace left pleural effusion lying dependently. 4. Aortic atherosclerosis, in addition to left main and 3 vessel coronary artery disease. 5. There are calcifications of the aortic valve and mitral annulus. Echocardiographic correlation for evaluation of potential  valvular dysfunction may be warranted if clinically indicated.  CARDIAC DATABASE: EKG: 08/28/2020: Atrial fibrillation, 78 bpm, right bundle branch block, without underlying injury pattern.    Echocardiogram: No results found for this or any previous visit from the past 1095 days.   LABORATORY DATA: External Labs: Collected: 05/02/2020 Creatinine 0.8 mg/dL. eGFR: 69mL/min per 1.73 m AST 18, ALT 10, alkaline phosphatase 61 Hemoglobin 13.3 g/dL, hematocrit 39.7  Collected: 10/31/2019: Hemoglobin 14.6 g/dL, hematocrit 44.2%  IMPRESSION:    ICD-10-CM   1. Thrombus of left atrial appendage  I51.3 ECHOCARDIOGRAM COMPLETE    2. Permanent atrial fibrillation (HCC)  I48.21 EKG 12-Lead    Digoxin level    3. Long term (current) use of anticoagulants  Z79.01 CMP14+EGFR    Hemoglobin and hematocrit, blood    4. History of stroke  Z86.73     5. Coronary atherosclerosis due to calcified coronary lesion  I25.10 ECHOCARDIOGRAM COMPLETE   I25.84     6. Atherosclerosis of aorta (HCC)  I70.0     7. Former smoker  Z87.891        RECOMMENDATIONS: SIAH KANNAN is a 87 y.o. female whose past medical history and cardiac risk factors include: Atrial fibrillation (permanent, per daughter), hx of stroke (per daughter in 70), history of breast cancer, osteoporosis bilateral hip fractures, dementia (per daughter), thrombus of the left atrial appendage (CT scan ), pulmonary nodule, advanced age, post menopausal female.   Thrombus of left atrial appendage Noted on recent CT of the chest with contrast - 08/2020 Ideally would recommend a transesophageal echocardiogram to confirm the left atrial appendage thrombus prior to further changing her current oral anticoagulation.  However, after discussing the risks, benefits, and alternatives to transesophageal echocardiogram patient's daughter would like to continue with conservative management which is very understandable given her age and baseline  dementia. Independently reviewed the CT images which does show a filling defect within the left atrial appendage highly suggestive of a thrombus. Recommended transitioning her off of Pradaxa as she may have failed the therapy and transitioning her to a different oral anticoagulation such as Coumadin, Eliquis, or Xarelto. However, before transitioning her to a different oral anticoagulation she would like to discuss this further with her other brothers and sisters prior to making a decision. In the meantime we will check kidney function, electrolytes, and hemoglobin. Echocardiogram will be ordered to evaluate for structural heart disease and left ventricular systolic function. Patient has a prior history of breast cancer status postlumpectomy, former smoker, would recommend if clinically appropriate and given the goals of care to screen for malignancy as she may be prone to hypercoagulable state.  Will defer further work-up to PCP at this time.  Permanent atrial fibrillation (HCC) Rate control: Diltiazem and digoxin Rhythm control: N/A Thromboembolic prophylaxis: Pradaxa CHA2DS2-VASc SCORE is 6 which correlates to 9.7% risk of stroke per year (age, gender, history of stroke, aortic atherosclerosis).  Check digoxin levels.  Long term (current) use of  anticoagulants Currently on Pradaxa.   Given the concern for left atrial appendage filling defect suggestive of LAA thrombus recommended transitioning her to a different oral anticoagulation.  As discussed above she will discuss with her other brothers and sisters prior to changing the medication.  She will call the office back with their decision.  FINAL MEDICATION LIST END OF ENCOUNTER: No orders of the defined types were placed in this encounter.   There are no discontinued medications.   Current Outpatient Medications:    acetaminophen (TYLENOL) 500 MG tablet, Take 1 tablet by mouth daily., Disp: , Rfl:    Cranberry 250 MG CAPS, Take 1  capsule by mouth daily., Disp: , Rfl:    Cyanocobalamin (B-12) 1000 MCG TABS, Take 1 tablet by mouth daily., Disp: , Rfl:    dabigatran (PRADAXA) 150 MG CAPS capsule, Take 1 capsule by mouth 2 (two) times daily., Disp: , Rfl:    denosumab (PROLIA) 60 MG/ML SOSY injection, Inject 60 mg into the skin See admin instructions. Every 6 months, Disp: , Rfl:    diclofenac Sodium (VOLTAREN) 1 % GEL, Apply topically 4 (four) times daily., Disp: , Rfl:    digoxin (LANOXIN) 0.125 MG tablet, Take 0.5 tablets by mouth daily., Disp: , Rfl:    diltiazem (TIAZAC) 180 MG 24 hr capsule, Take 180 mg by mouth daily., Disp: , Rfl:    docusate sodium (COLACE) 100 MG capsule, as needed., Disp: , Rfl:    ergocalciferol (VITAMIN D2) 1.25 MG (50000 UT) capsule, Take 1 capsule by mouth daily., Disp: , Rfl:    escitalopram (LEXAPRO) 10 MG tablet, Take 10 mg by mouth daily., Disp: , Rfl:    ketoconazole (NIZORAL) 2 % shampoo, See admin instructions., Disp: , Rfl:    Lactobacillus (ACIDOPHILUS PROBIOTIC) 100 MG CAPS, Take 1 capsule by mouth daily., Disp: , Rfl:    memantine (NAMENDA) 10 MG tablet, Take 1 tablet by mouth 2 (two) times daily., Disp: , Rfl:    Multiple Vitamins-Minerals (PRESERVISION AREDS 2) CAPS, Take 1 capsule by mouth in the morning and at bedtime., Disp: , Rfl:   Orders Placed This Encounter  Procedures   Digoxin level   CMP14+EGFR   Hemoglobin and hematocrit, blood   EKG 12-Lead   ECHOCARDIOGRAM COMPLETE    There are no Patient Instructions on file for this visit.   --Continue cardiac medications as reconciled in final medication list. --Return in about 2 weeks (around 09/11/2020) for Follow up presumed LAA clot. . Or sooner if needed. --Continue follow-up with your primary care physician regarding the management of your other chronic comorbid conditions.  Patient's questions and concerns were addressed to her satisfaction. She voices understanding of the instructions provided during this encounter.    This note was created using a voice recognition software as a result there may be grammatical errors inadvertently enclosed that do not reflect the nature of this encounter. Every attempt is made to correct such errors.  Rex Kras, [place], [facility]  Pager: [phone] Office: [phone]

## 2020-08-28 ENCOUNTER — Encounter: Payer: Self-pay | Admitting: Cardiology

## 2020-08-28 ENCOUNTER — Ambulatory Visit: Payer: Medicare Other | Admitting: Cardiology

## 2020-08-28 ENCOUNTER — Other Ambulatory Visit: Payer: Self-pay

## 2020-08-28 VITALS — BP 129/57 | HR 81 | Temp 97.8°F | Resp 16 | Ht 67.0 in | Wt 102.0 lb

## 2020-08-28 DIAGNOSIS — Z7901 Long term (current) use of anticoagulants: Secondary | ICD-10-CM

## 2020-08-28 DIAGNOSIS — Z87891 Personal history of nicotine dependence: Secondary | ICD-10-CM

## 2020-08-28 DIAGNOSIS — I2584 Coronary atherosclerosis due to calcified coronary lesion: Secondary | ICD-10-CM

## 2020-08-28 DIAGNOSIS — Z8673 Personal history of transient ischemic attack (TIA), and cerebral infarction without residual deficits: Secondary | ICD-10-CM

## 2020-08-28 DIAGNOSIS — I513 Intracardiac thrombosis, not elsewhere classified: Secondary | ICD-10-CM

## 2020-08-28 DIAGNOSIS — I2510 Atherosclerotic heart disease of native coronary artery without angina pectoris: Secondary | ICD-10-CM

## 2020-08-28 DIAGNOSIS — I700 Atherosclerosis of aorta: Secondary | ICD-10-CM

## 2020-08-28 DIAGNOSIS — I4821 Permanent atrial fibrillation: Secondary | ICD-10-CM

## 2020-08-29 LAB — CMP14+EGFR
ALT: 13 IU/L (ref 0–32)
AST: 18 IU/L (ref 0–40)
Albumin/Globulin Ratio: 1.7 (ref 1.2–2.2)
Albumin: 3.8 g/dL (ref 3.5–4.6)
Alkaline Phosphatase: 76 IU/L (ref 44–121)
BUN/Creatinine Ratio: 12 (ref 12–28)
BUN: 10 mg/dL (ref 10–36)
Bilirubin Total: 0.6 mg/dL (ref 0.0–1.2)
CO2: 26 mmol/L (ref 20–29)
Calcium: 9.3 mg/dL (ref 8.7–10.3)
Chloride: 99 mmol/L (ref 96–106)
Creatinine, Ser: 0.81 mg/dL (ref 0.57–1.00)
Globulin, Total: 2.2 g/dL (ref 1.5–4.5)
Glucose: 71 mg/dL (ref 65–99)
Potassium: 4.3 mmol/L (ref 3.5–5.2)
Sodium: 139 mmol/L (ref 134–144)
Total Protein: 6 g/dL (ref 6.0–8.5)
eGFR: 66 mL/min/{1.73_m2} (ref >59)

## 2020-08-29 LAB — HEMOGLOBIN AND HEMATOCRIT, BLOOD
Hematocrit: 36.6 % (ref 34.0–46.6)
Hemoglobin: 12.5 g/dL (ref 11.1–15.9)

## 2020-08-29 LAB — DIGOXIN LEVEL: Digoxin, Serum: 0.4 ng/mL — ABNORMAL LOW (ref 0.5–0.9)

## 2020-09-03 NOTE — Progress Notes (Signed)
No answer left a vm

## 2020-09-04 NOTE — Progress Notes (Signed)
Spoke to patient's daughter she is aware of results

## 2020-09-05 ENCOUNTER — Other Ambulatory Visit: Payer: Self-pay

## 2020-09-05 ENCOUNTER — Encounter (INDEPENDENT_AMBULATORY_CARE_PROVIDER_SITE_OTHER): Payer: Medicare Other | Admitting: Ophthalmology

## 2020-09-05 DIAGNOSIS — I10 Essential (primary) hypertension: Secondary | ICD-10-CM

## 2020-09-05 DIAGNOSIS — H43813 Vitreous degeneration, bilateral: Secondary | ICD-10-CM | POA: Diagnosis not present

## 2020-09-05 DIAGNOSIS — H35033 Hypertensive retinopathy, bilateral: Secondary | ICD-10-CM | POA: Diagnosis not present

## 2020-09-05 DIAGNOSIS — H353231 Exudative age-related macular degeneration, bilateral, with active choroidal neovascularization: Secondary | ICD-10-CM | POA: Diagnosis not present

## 2020-09-13 ENCOUNTER — Other Ambulatory Visit: Payer: Self-pay

## 2020-09-13 ENCOUNTER — Ambulatory Visit (HOSPITAL_COMMUNITY)
Admission: RE | Admit: 2020-09-13 | Discharge: 2020-09-13 | Disposition: A | Discharge status: Home / Self Care | Payer: Medicare Other | Source: Ambulatory Visit | Attending: Cardiology | Admitting: Cardiology

## 2020-09-13 DIAGNOSIS — I2584 Coronary atherosclerosis due to calcified coronary lesion: Secondary | ICD-10-CM | POA: Insufficient documentation

## 2020-09-13 DIAGNOSIS — I513 Intracardiac thrombosis, not elsewhere classified: Secondary | ICD-10-CM | POA: Diagnosis present

## 2020-09-13 DIAGNOSIS — I2510 Atherosclerotic heart disease of native coronary artery without angina pectoris: Secondary | ICD-10-CM | POA: Insufficient documentation

## 2020-09-13 NOTE — Progress Notes (Signed)
  Echocardiogram 2D Echocardiogram has been performed.  Bridget [name] 09/13/2020, 1:56 PM

## 2020-09-18 ENCOUNTER — Encounter (HOSPITAL_COMMUNITY): Payer: Medicare Other

## 2020-09-18 ENCOUNTER — Ambulatory Visit: Payer: Medicare Other | Admitting: Cardiology

## 2020-09-19 ENCOUNTER — Ambulatory Visit (INDEPENDENT_AMBULATORY_CARE_PROVIDER_SITE_OTHER): Payer: Medicare Other | Admitting: Pulmonary Disease

## 2020-09-19 ENCOUNTER — Encounter: Payer: Self-pay | Admitting: Pulmonary Disease

## 2020-09-19 ENCOUNTER — Other Ambulatory Visit: Payer: Self-pay

## 2020-09-19 VITALS — BP 110/50 | HR 85 | Temp 97.6°F | Ht 67.0 in | Wt 133.2 lb

## 2020-09-19 DIAGNOSIS — J479 Bronchiectasis, uncomplicated: Secondary | ICD-10-CM

## 2020-09-19 DIAGNOSIS — R918 Other nonspecific abnormal finding of lung field: Secondary | ICD-10-CM | POA: Diagnosis not present

## 2020-09-19 MED ORDER — FLUTICASONE-SALMETEROL 115-21 MCG/ACT IN AERO: 2.0000 | INHALATION_SPRAY | Freq: Two times a day (BID) | RESPIRATORY_TRACT | 12 refills | Status: DC

## 2020-09-19 NOTE — Progress Notes (Signed)
Subjective:   PATIENT ID: Bridget [name] GENDER: female DOB: [birth year], MRN: [id number]   HPI  Chief Complaint  Patient presents with   Consult    Referred by PCP Domenick Gong, for nodules. Has scar tissue on lungs due to whooping cough when she was younger. Recently had pneumonia, she also has a lot of coughing to get flem up. Use to lay on slant board but has not been able to since she broke her rt femur in 2015 and lt in 2019.     Reason for Visit: New consult for bronchiectasis  Bridget [name] is a 87 year old female former smoker with dementia, atrial fibrillation, left thrombus of atrial appendage, hx breast cancer s/p lumpectomy, osteoporosis.  She has previously seen a Pulmonologist 85 years ago for hemoptysis that resolved after initial presentation. Attributed to bronchiectasis and she was treated with a slant board. She has a chronic cough associated with white sputum that equates to at least 2-3 tablespoons. Denies hemoptysis. Denies wheezing or shortness of breath.  She has a history of whooping cough as a child.  Social History: Former smoker. Smoked 1/2 ppd x 15 years. Quit in 1950   I have personally reviewed patient's past medical/family/social history, allergies, current medications.  Past Medical History:  Diagnosis Date   Atherosclerosis of aorta (HCC)    Atrial fibrillation (HCC)    Breast cancer ([place])    Coronary atherosclerosis due to calcified coronary lesion    Stroke ([place])    Thrombus of left atrial appendage      Family History  Problem Relation Age of Onset   Other Sister        TRAUMA TO HEAD AFTER A FALL     Social History   Occupational History   Not on file  Tobacco Use   Smoking status: Former    Types: Cigarettes    Quit date: 1950    Years since quitting: 72.7   Smokeless tobacco: Never  Vaping Use   Vaping Use: Never used  Substance and Sexual Activity   Alcohol use: Never   Drug use: Never   Sexual activity: Not on  file    Allergies  Allergen Reactions   Erythromycin     Unknown, but "all the mycins bother me"   Oxycodone Other (See Comments)    confusion   Tramadol Other (See Comments)    confusion     Outpatient Medications Prior to Visit  Medication Sig Dispense Refill   acetaminophen (TYLENOL) 500 MG tablet Take 1 tablet by mouth daily.     Cranberry 250 MG CAPS Take 1 capsule by mouth daily.     Cyanocobalamin (B-12) 1000 MCG TABS Take 1 tablet by mouth daily.     dabigatran (PRADAXA) 150 MG CAPS capsule Take 1 capsule by mouth 2 (two) times daily.     denosumab (PROLIA) 60 MG/ML SOSY injection Inject 60 mg into the skin See admin instructions. Every 6 months     diclofenac Sodium (VOLTAREN) 1 % GEL Apply topically 4 (four) times daily.     digoxin (LANOXIN) 0.125 MG tablet Take 0.5 tablets by mouth daily.     diltiazem (TIAZAC) 180 MG 24 hr capsule Take 180 mg by mouth daily.     docusate sodium (COLACE) 100 MG capsule as needed.     ergocalciferol (VITAMIN D2) 1.25 MG (50000 UT) capsule Take 1 capsule by mouth daily.     escitalopram (LEXAPRO) 10 MG tablet Take  10 mg by mouth daily.     ketoconazole (NIZORAL) 2 % shampoo See admin instructions.     Lactobacillus (ACIDOPHILUS PROBIOTIC) 100 MG CAPS Take 1 capsule by mouth daily.     memantine (NAMENDA) 10 MG tablet Take 1 tablet by mouth 2 (two) times daily.     Multiple Vitamins-Minerals (PRESERVISION AREDS 2) CAPS Take 1 capsule by mouth in the morning and at bedtime.     No facility-administered medications prior to visit.    Review of Systems  Constitutional:  Negative for chills, diaphoresis, fever, malaise/fatigue and weight loss.  HENT:  Negative for congestion, ear pain and sore throat.   Respiratory:  Positive for cough and sputum production. Negative for hemoptysis, shortness of breath and wheezing.   Cardiovascular:  Negative for chest pain, palpitations and leg swelling.  Gastrointestinal:  Negative for abdominal pain,  heartburn and nausea.  Genitourinary:  Negative for frequency.  Musculoskeletal:  Negative for joint pain and myalgias.  Skin:  Negative for itching and rash.  Neurological:  Negative for dizziness, weakness and headaches.  Endo/Heme/Allergies:  Does not bruise/bleed easily.  Psychiatric/Behavioral:  Negative for depression. The patient is not nervous/anxious.     Objective:   Vitals:   09/19/20 1545  BP: (!) 110/50  Pulse: 85  Temp: 97.6 F (36.4 C)  TempSrc: Oral  Weight: 133 lb 3.2 oz (60.4 kg)  Height: '5\' 7"'$  (1.702 m)      Physical Exam: General: Well-appearing, no acute distress HENT: [place], AT Eyes: EOMI, no scleral icterus Respiratory: Clear to auscultation bilaterally.  No crackles, wheezing or rales Cardiovascular: RRR, -M/R/G, no JVD Extremities:-Edema,-tenderness Neuro: AAO x4, CNII-XII grossly intact Psych: Normal mood, normal affect  Data Reviewed:  Imaging: CT Chest 08/17/20 - Scattered cylindrical bronchiectasis with interstitium and peribronchovascular ground glass attenuation and micro-nodularity. Complete atelectasis in the RML. LLL pleural nodules measuring 9 x 62m and 11 x 8 mm nodules  PFT: None on file  Labs: CBC    Component Value Date/Time   HGB 12.5 08/28/2020 1402   HCT 36.6 08/28/2020 1402  Normal hematocrit  Assessment & Plan:   Discussion: 87year old female former smoker with dementia, atrial fibrillation, left thrombus of atrial appendage, hx breast cancer s/p lumpectomy, osteoporosis who presents as a new consult for bronchiectasis and pulmonary nodules.  Bronchiectasis with chronic bronchitis --START Advair 115-21 mcg TWO puffs TWICE a day --START Mucinex-DM for five days. Follow package instructions  Left pleural lung nodules --CT Chest without contrast in 3 months  Health Maintenance Immunization History  Administered Date(s) Administered   Influenza, High Dose Seasonal PF 09/06/2017   Influenza, Quadrivalent,  Recombinant, Inj, Pf 09/30/2018, 10/31/2019, 09/12/2020   Moderna Sars-Covid-2 Vaccination 01/07/2019, 02/04/2019, 11/18/2019   CT Lung Screen - Not qualified. >15 years after tobacco cessation  No orders of the defined types were placed in this encounter.  Meds ordered this encounter  Medications   fluticasone-salmeterol (ADVAIR HFA) 115-21 MCG/ACT inhaler    Sig: Inhale 2 puffs into the lungs 2 (two) times daily.    Dispense:  1 each    Refill:  12    Return in about 3 months (around 12/19/2020).  I have spent a total time of 45-minutes on the day of the appointment reviewing prior documentation, coordinating care and discussing medical diagnosis and plan with the patient/family. Imaging, labs and tests included in this note have been reviewed and interpreted independently by me.  Kanylah Muench JRodman Pickle MD [facility]  09/19/2020 3:29 PM  Office Number [phone]

## 2020-09-19 NOTE — Progress Notes (Signed)
Patient seen in the office today and instructed on use of Advair HFA.  Patient expressed understanding and demonstrated technique.

## 2020-09-19 NOTE — Patient Instructions (Addendum)
Bronchiectasis with chronic bronchitis --START Advair 115-21 mcg TWO puffs TWICE a day --START Mucinex-DM for five days. Follow package instructions  Left pleural lung nodules --CT Chest without contrast in 3 months  Follow-up with me in 3 months. CT scan needs to be scheduled for visit

## 2020-09-20 ENCOUNTER — Ambulatory Visit: Payer: Medicare Other | Admitting: Cardiology

## 2020-09-20 ENCOUNTER — Encounter: Payer: Self-pay | Admitting: Cardiology

## 2020-09-20 VITALS — BP 131/64 | HR 94 | Temp 98.5°F | Resp 16 | Ht 67.0 in | Wt 133.6 lb

## 2020-09-20 DIAGNOSIS — I2510 Atherosclerotic heart disease of native coronary artery without angina pectoris: Secondary | ICD-10-CM

## 2020-09-20 DIAGNOSIS — Z8673 Personal history of transient ischemic attack (TIA), and cerebral infarction without residual deficits: Secondary | ICD-10-CM

## 2020-09-20 DIAGNOSIS — I4821 Permanent atrial fibrillation: Secondary | ICD-10-CM

## 2020-09-20 DIAGNOSIS — Z7901 Long term (current) use of anticoagulants: Secondary | ICD-10-CM

## 2020-09-20 DIAGNOSIS — I513 Intracardiac thrombosis, not elsewhere classified: Secondary | ICD-10-CM

## 2020-09-20 DIAGNOSIS — I700 Atherosclerosis of aorta: Secondary | ICD-10-CM

## 2020-09-20 LAB — ECHOCARDIOGRAM COMPLETE
AR max vel: 2.29 cm2
AV Area VTI: 2.04 cm2
AV Area mean vel: 2.27 cm2
AV Mean grad: 11 mmHg
AV Peak grad: 19.8 mmHg
Ao pk vel: 2.23 m/s
Area-P 1/2: 3.91 cm2
P 1/2 time: 441 msec
S' Lateral: 2.5 cm
Single Plane A4C EF: 44.9 %

## 2020-09-20 MED ORDER — APIXABAN 5 MG PO TABS
5.0000 mg | ORAL_TABLET | Freq: Two times a day (BID) | ORAL | 1 refills | Status: DC
Start: 2020-09-20 — End: 2021-12-10

## 2020-09-20 NOTE — Progress Notes (Signed)
Date:  09/20/2020   ID:  [name], DOB [birth year], MRN [id number]  PCP:  Bridget Garbe, MD  Cardiologist:  Bridget Lerner, DO, [facility]  (established care 08/28/2020)  Date: 09/20/20 Last Office Visit: 08/28/2020  Chief Complaint  Patient presents with   Thrombus of left atrial appendage   Follow-up    HPI  Bridget [name] is a 87 y.o. female who presents to the office with a chief complaint of " management of atrial fibrillation and thrombus in the left atrial appendage." Patient's past medical history and cardiovascular risk factors include: Atrial fibrillation (permanent, per daughter), hx of stroke (per daughter in 2017), history of breast cancer, osteoporosis bilateral hip fractures, dementia (per daughter), thrombus of the left atrial appendage (CT scan ), pulmonary nodule, advanced age, post menopausal female.   She is referred to the office at the request of Bridget [name], Bridget Koh, MD for evaluation of atrial fibrillation.  Patient is accompanied by her daughter Bridget [name] at today's visit.  Her other siblings Bridget [name] (sister) and Bridget [name] (brother) were also available via telephone during today's encounter.  Patient recently moved from [place] to [place] to be closer to family and has been experiencing chronic cough for which she underwent CT chest for further evaluation and management.  She was noted to have incidental finding of left atrial appendage thrombus and referred to cardiology for further evaluation and management.  Patient is currently on Pradaxa with regards to thromboembolic prophylaxis and according to the patient's daughter Bridget [name] at the last office visit may have skipped a total of 5 doses since January 2022.  In the last office visit we discussed proceeding with either TEE for further evaluation or transitioning her Pradaxa to another oral anticoagulant.  The shared decision was to hold off on any changes until she spoke to her other siblings.  In addition, echocardiogram  was performed to reevaluate LVEF, LV thrombus.  Results of the echocardiogram reviewed with the patient and family at today's encounter noted below for further reference.  With regards to atrial fibrillation she was diagnosed more than 10 years ago while living in [place].  No prior history of cardioversion or atrial fibrillation ablation to the best of her daughter's knowledge.  No history of intracranial bleeding or gastrointestinal bleeding.  And she has been on oral anticoagulation after her stroke in 2017 without any overt complications.  FUNCTIONAL STATUS: Works with physical therapy for 45 minutes 3 times a week and on colder days and goes out for a walk with her daughter Bridget [name].  ALLERGIES: Allergies  Allergen Reactions   Erythromycin     Unknown, but "all the mycins bother me"   Oxycodone Other (See Comments)    confusion   Tramadol Other (See Comments)    confusion    MEDICATION LIST PRIOR TO VISIT: Current Meds  Medication Sig   acetaminophen (TYLENOL) 500 MG tablet Take 1 tablet by mouth daily.   apixaban (ELIQUIS) 5 MG TABS tablet Take 1 tablet (5 mg total) by mouth 2 (two) times daily.   Cranberry 250 MG CAPS Take 1 capsule by mouth daily.   Cyanocobalamin (B-12) 1000 MCG TABS Take 1 tablet by mouth daily.   denosumab (PROLIA) 60 MG/ML SOSY injection Inject 60 mg into the skin See admin instructions. Every 6 months   diclofenac Sodium (VOLTAREN) 1 % GEL Apply topically 4 (four) times daily.   digoxin (LANOXIN) 0.125 MG tablet Take 0.5 tablets by mouth daily.   diltiazem (TIAZAC) 180 MG  24 hr capsule Take 180 mg by mouth daily.   docusate sodium (COLACE) 100 MG capsule as needed.   ergocalciferol (VITAMIN D2) 1.25 MG (50000 UT) capsule Take 1 capsule by mouth daily.   escitalopram (LEXAPRO) 10 MG tablet Take 10 mg by mouth daily.   fluticasone-salmeterol (ADVAIR HFA) 115-21 MCG/ACT inhaler Inhale 2 puffs into the lungs 2 (two) times daily.   ketoconazole (NIZORAL) 2 %  shampoo See admin instructions.   Lactobacillus (ACIDOPHILUS PROBIOTIC) 100 MG CAPS Take 1 capsule by mouth daily.   memantine (NAMENDA) 10 MG tablet Take 1 tablet by mouth 2 (two) times daily.   Multiple Vitamins-Minerals (PRESERVISION AREDS 2) CAPS Take 1 capsule by mouth in the morning and at bedtime.   [DISCONTINUED] dabigatran (PRADAXA) 150 MG CAPS capsule Take 1 capsule by mouth 2 (two) times daily.     PAST MEDICAL HISTORY: Past Medical History:  Diagnosis Date   Atherosclerosis of aorta (HCC)    Atrial fibrillation ([place])    Breast cancer ([place])    Coronary atherosclerosis due to calcified coronary lesion    Stroke ([place])    Thrombus of left atrial appendage     PAST SURGICAL HISTORY: Past Surgical History:  Procedure Laterality Date   BREAST LUMPECTOMY Left 2001   FEMUR FRACTURE SURGERY Right 2019   RIGHT - 2015   TOTAL ABDOMINAL HYSTERECTOMY  1967    FAMILY HISTORY: The patient family history includes Other in her sister.  SOCIAL HISTORY:  The patient  reports that she quit smoking about 72 years ago. Her smoking use included cigarettes. She has never used smokeless tobacco. She reports that she does not drink alcohol and does not use drugs.  REVIEW OF SYSTEMS: Review of Systems  Constitutional: Negative for chills and fever.  HENT:  Negative for hoarse voice and nosebleeds.   Eyes:  Negative for discharge, double vision and pain.  Cardiovascular:  Negative for chest pain, claudication, dyspnea on exertion, leg swelling, near-syncope, orthopnea, palpitations, paroxysmal nocturnal dyspnea and syncope.  Respiratory:  Negative for hemoptysis and shortness of breath.   Musculoskeletal:  Negative for muscle cramps and myalgias.  Gastrointestinal:  Negative for abdominal pain, constipation, diarrhea, hematemesis, hematochezia, melena, nausea and vomiting.  Neurological:  Negative for dizziness and light-headedness.   PHYSICAL EXAM: Vitals with BMI 09/20/2020 09/19/2020  08/28/2020  Height $Remov'5\' 7"'RpOmxN$  $Remove'5\' 7"'koWkjfY$  $RemoveB'5\' 7"'ZQuQqAax$   Weight 133 lbs 10 oz 133 lbs 3 oz 102 lbs  BMI 20.92 97.67 34.19  Systolic 379 024 097  Diastolic 64 50 57  Pulse 94 85 81    CONSTITUTIONAL: Age-appropriate female, hemodynamically stable, well-developed and well-nourished. No acute distress.  SKIN: Skin is warm and dry. No rash noted. No cyanosis. No pallor. No jaundice HEAD: Normocephalic and atraumatic.  EYES: No scleral icterus MOUTH/THROAT: Moist oral membranes.  NECK: No JVD present. No thyromegaly noted. No carotid bruits  LYMPHATIC: No visible cervical adenopathy.  CHEST Normal respiratory effort. No intercostal retractions  LUNGS: Clear to auscultation bilaterally.  No stridor. No wheezes. No rales.  CARDIOVASCULAR: Irregularly irregular, soft holosystolic murmur heard at the apex, variable S1-S2, no gallops or rubs. ABDOMINAL: Soft, nontender, nondistended, positive bowel sounds in all 4 quadrants, no apparent ascites.  EXTREMITIES: No peripheral edema, 2+ DP and PT pulses. HEMATOLOGIC: No significant bruising NEUROLOGIC: Oriented to person, place, and time. Nonfocal. Normal muscle tone.  PSYCHIATRIC: Normal mood and affect. Normal behavior. Cooperative  RADIOLOGY: CT chest with contrast: 08/17/2020 IMPRESSION: 1. Scattered areas of bronchiectasis are  noted in the lungs bilaterally with associated chronic scarring/atelectasis in the right middle lobe. Notably, there also 2 large pulmonary nodules in the posterior aspect of the left lower lobe. These are favored to be of infectious or inflammatory etiology, but underlying neoplasm is difficult to entirely exclude. Accordingly, short-term repeat chest CT is recommended 1-2 months to ensure the stability or regression of these findings. 2. Cardiomegaly with biatrial dilatation. Notably, there is a large filling defect in the tip of the left atrial appendage highly concerning for left atrial appendage thrombus. If present, this places the  patient at high risk for systemic embolization. Further evaluation with transesophageal echocardiography is recommended if clinically appropriate. 3. Trace left pleural effusion lying dependently. 4. Aortic atherosclerosis, in addition to left main and 3 vessel coronary artery disease. 5. There are calcifications of the aortic valve and mitral annulus. Echocardiographic correlation for evaluation of potential valvular dysfunction may be warranted if clinically indicated.  CARDIAC DATABASE: EKG: 08/28/2020: Atrial fibrillation, 78 bpm, right bundle branch block, without underlying injury pattern.    Echocardiogram: 09/13/2020: LVEF 50-55%, no regional wall motion abnormalities, diastolic dysfunction not evaluated due to underlying rhythm being atrial fibrillation, biatrial dilatation, moderate MR, moderate TR, no pulmonary hypertension, aortic plaque within the descending aorta.   LABORATORY DATA: External Labs: Collected: 05/02/2020 Creatinine 0.8 mg/dL. eGFR: 83mL/min per 1.73 m AST 18, ALT 10, alkaline phosphatase 61 Hemoglobin 13.3 g/dL, hematocrit 39.7  Collected: 10/31/2019: Hemoglobin 14.6 g/dL, hematocrit 44.2%  IMPRESSION:    ICD-10-CM   1. Thrombus of left atrial appendage  I51.3 apixaban (ELIQUIS) 5 MG TABS tablet    2. Permanent atrial fibrillation (HCC)  I48.21 apixaban (ELIQUIS) 5 MG TABS tablet    3. Long term (current) use of anticoagulants  Z79.01 apixaban (ELIQUIS) 5 MG TABS tablet    4. History of stroke  Z86.73     5. Coronary atherosclerosis due to calcified coronary lesion  I25.10    I25.84     6. Atherosclerosis of aorta (HCC)  I70.0        RECOMMENDATIONS: ZALEY TALLEY is a 87 y.o. female whose past medical history and cardiac risk factors include: Atrial fibrillation (permanent, per daughter), hx of stroke (per daughter in 49), history of breast cancer, osteoporosis bilateral hip fractures, dementia (per daughter), thrombus of the left atrial  appendage (CT scan ), pulmonary nodule, advanced age, post menopausal female.   Thrombus of left atrial appendage Noted on recent CT of the chest with contrast - 08/2020. Since last office visit patient's daughter Bridget [name] spoke to other siblings who are also present via telephone at today's encounter and the shared decision was to transition her from Pradaxa to Eliquis. Patient and her kids refused to proceed with transesophageal echocardiogram to further confirm the left atrial appendage thrombus (as sometimes there could be delayed emptying of the LAA).   Patient has a prior history of breast cancer status postlumpectomy, former smoker, would recommend if clinically appropriate and given the goals of care to screen for malignancy as she may be prone to hypercoagulable state.  Will defer further work-up to PCP at this time.  Permanent atrial fibrillation (HCC) Rate control: Diltiazem and digoxin Rhythm control: N/A Thromboembolic prophylaxis: We will transition to Eliquis. CHA2DS2-VASc SCORE is 6 which correlates to 9.7% risk of stroke per year (age, gender, history of stroke, aortic atherosclerosis).   Long term (current) use of anticoagulants Currently on Pradaxa, will transition to Eliquis as stated above. Labs from 08/29/2020 independently reviewed  and noted above for further reference.  Patient's hemoglobin has been slowly downtrending I have asked her to discuss this further with PCP to see if additional work-up is warranted or careful watching.  She denies any evidence of bleeding. Will send in a prescription for Eliquis 5 mg p.o. twice daily (age 80, 61 kg, renal function 0.8 mg/dL) I have asked the patient's daughter Bridget [name] to call the office if her weight consistently is less than 60 kg because the dose of Eliquis will need to be down titrated.  She verbalizes understanding.  Would recommend being treated with Eliquis for 3 months and then rechecking the CT to evaluate the absence or  presence of left atrial appendage thrombus.  FINAL MEDICATION LIST END OF ENCOUNTER: Meds ordered this encounter  Medications   apixaban (ELIQUIS) 5 MG TABS tablet    Sig: Take 1 tablet (5 mg total) by mouth 2 (two) times daily.    Dispense:  180 tablet    Refill:  1     Medications Discontinued During This Encounter  Medication Reason   dabigatran (PRADAXA) 150 MG CAPS capsule Change in therapy     Current Outpatient Medications:    acetaminophen (TYLENOL) 500 MG tablet, Take 1 tablet by mouth daily., Disp: , Rfl:    apixaban (ELIQUIS) 5 MG TABS tablet, Take 1 tablet (5 mg total) by mouth 2 (two) times daily., Disp: 180 tablet, Rfl: 1   Cranberry 250 MG CAPS, Take 1 capsule by mouth daily., Disp: , Rfl:    Cyanocobalamin (B-12) 1000 MCG TABS, Take 1 tablet by mouth daily., Disp: , Rfl:    denosumab (PROLIA) 60 MG/ML SOSY injection, Inject 60 mg into the skin See admin instructions. Every 6 months, Disp: , Rfl:    diclofenac Sodium (VOLTAREN) 1 % GEL, Apply topically 4 (four) times daily., Disp: , Rfl:    digoxin (LANOXIN) 0.125 MG tablet, Take 0.5 tablets by mouth daily., Disp: , Rfl:    diltiazem (TIAZAC) 180 MG 24 hr capsule, Take 180 mg by mouth daily., Disp: , Rfl:    docusate sodium (COLACE) 100 MG capsule, as needed., Disp: , Rfl:    ergocalciferol (VITAMIN D2) 1.25 MG (50000 UT) capsule, Take 1 capsule by mouth daily., Disp: , Rfl:    escitalopram (LEXAPRO) 10 MG tablet, Take 10 mg by mouth daily., Disp: , Rfl:    fluticasone-salmeterol (ADVAIR HFA) 115-21 MCG/ACT inhaler, Inhale 2 puffs into the lungs 2 (two) times daily., Disp: 1 each, Rfl: 12   ketoconazole (NIZORAL) 2 % shampoo, See admin instructions., Disp: , Rfl:    Lactobacillus (ACIDOPHILUS PROBIOTIC) 100 MG CAPS, Take 1 capsule by mouth daily., Disp: , Rfl:    memantine (NAMENDA) 10 MG tablet, Take 1 tablet by mouth 2 (two) times daily., Disp: , Rfl:    Multiple Vitamins-Minerals (PRESERVISION AREDS 2) CAPS, Take 1  capsule by mouth in the morning and at bedtime., Disp: , Rfl:   No orders of the defined types were placed in this encounter.  There are no Patient Instructions on file for this visit.   --Continue cardiac medications as reconciled in final medication list. --Return in about 14 weeks (around 12/27/2020) for Follow up, A. fib. Or sooner if needed. --Continue follow-up with your primary care physician regarding the management of your other chronic comorbid conditions.  Patient's questions and concerns were addressed to her satisfaction. She voices understanding of the instructions provided during this encounter.   This note was created using a  voice recognition software as a result there may be grammatical errors inadvertently enclosed that do not reflect the nature of this encounter. Every attempt is made to correct such errors.  Rex Kras, [place], [facility]  Pager: [phone] Office: [phone]

## 2020-09-24 ENCOUNTER — Other Ambulatory Visit (HOSPITAL_COMMUNITY): Payer: Self-pay | Admitting: *Deleted

## 2020-09-25 ENCOUNTER — Encounter (HOSPITAL_COMMUNITY): Payer: Medicare Other

## 2020-09-25 ENCOUNTER — Encounter (HOSPITAL_COMMUNITY): Payer: Self-pay

## 2020-09-25 ENCOUNTER — Telehealth: Payer: Self-pay | Admitting: Cardiology

## 2020-09-25 NOTE — Telephone Encounter (Signed)
Called pt and daughter answered and informed me that the medication is mucinex DM and advair inhaler that pt pulmonologist prescibed. Daughter wants to know if it is ok to take. Also pt started on Eliquis today.pease advise

## 2020-09-25 NOTE — Telephone Encounter (Signed)
Pt daughter is calling about medication that the pulmonologist has prescribed her and how it will act with her medication we prescribe.

## 2020-10-01 ENCOUNTER — Other Ambulatory Visit (HOSPITAL_COMMUNITY): Payer: Self-pay | Admitting: *Deleted

## 2020-10-01 NOTE — Telephone Encounter (Signed)
That should be fine.  Keep in mind fall precautions.

## 2020-10-01 NOTE — Telephone Encounter (Signed)
Called pt and daughter answered, informed her it was ok for pt to take them, daughter understood.

## 2020-10-02 ENCOUNTER — Encounter (HOSPITAL_COMMUNITY): Payer: Medicare Other

## 2020-10-02 DIAGNOSIS — J471 Bronchiectasis with (acute) exacerbation: Secondary | ICD-10-CM | POA: Insufficient documentation

## 2020-10-02 DIAGNOSIS — R918 Other nonspecific abnormal finding of lung field: Secondary | ICD-10-CM | POA: Insufficient documentation

## 2020-10-02 DIAGNOSIS — J479 Bronchiectasis, uncomplicated: Secondary | ICD-10-CM | POA: Insufficient documentation

## 2020-10-08 ENCOUNTER — Telehealth: Payer: Self-pay

## 2020-10-08 NOTE — Telephone Encounter (Signed)
Patient is aware 

## 2020-10-08 NOTE — Telephone Encounter (Signed)
If it clinically needed please proceed as directed by your urologist.

## 2020-10-31 ENCOUNTER — Other Ambulatory Visit: Payer: Self-pay

## 2020-10-31 ENCOUNTER — Encounter (INDEPENDENT_AMBULATORY_CARE_PROVIDER_SITE_OTHER): Payer: Medicare Other | Admitting: Ophthalmology

## 2020-10-31 DIAGNOSIS — H35033 Hypertensive retinopathy, bilateral: Secondary | ICD-10-CM | POA: Diagnosis not present

## 2020-10-31 DIAGNOSIS — I10 Essential (primary) hypertension: Secondary | ICD-10-CM

## 2020-10-31 DIAGNOSIS — H353231 Exudative age-related macular degeneration, bilateral, with active choroidal neovascularization: Secondary | ICD-10-CM | POA: Diagnosis not present

## 2020-10-31 DIAGNOSIS — H43813 Vitreous degeneration, bilateral: Secondary | ICD-10-CM

## 2020-11-13 ENCOUNTER — Ambulatory Visit (HOSPITAL_COMMUNITY)
Admission: RE | Admit: 2020-11-13 | Discharge: 2020-11-13 | Disposition: A | Discharge status: Home / Self Care | Payer: Medicare Other | Source: Ambulatory Visit | Attending: Internal Medicine | Admitting: Internal Medicine

## 2020-11-13 ENCOUNTER — Other Ambulatory Visit: Payer: Self-pay

## 2020-11-13 DIAGNOSIS — M810 Age-related osteoporosis without current pathological fracture: Secondary | ICD-10-CM | POA: Insufficient documentation

## 2020-11-13 MED ORDER — DENOSUMAB 60 MG/ML ~~LOC~~ SOSY
PREFILLED_SYRINGE | SUBCUTANEOUS | Status: AC
  Filled 2020-11-13: qty 1

## 2020-11-13 MED ORDER — DENOSUMAB 60 MG/ML ~~LOC~~ SOSY
60.0000 mg | PREFILLED_SYRINGE | Freq: Once | SUBCUTANEOUS | Status: AC
  Administered 2020-11-13: 60 mg via SUBCUTANEOUS

## 2020-11-19 DIAGNOSIS — M533 Sacrococcygeal disorders, not elsewhere classified: Secondary | ICD-10-CM | POA: Insufficient documentation

## 2020-11-21 ENCOUNTER — Telehealth: Payer: Self-pay | Admitting: Pulmonary Disease

## 2020-11-21 DIAGNOSIS — R918 Other nonspecific abnormal finding of lung field: Secondary | ICD-10-CM

## 2020-11-21 NOTE — Telephone Encounter (Signed)
I have called the pts daughter and she is aware of CT that has been scheduled for Dec.  She is aware that once this date is scheduled for the CT she willl need to call the office back to get her appt scheduled with JE.  Nothing further is needed.

## 2020-11-24 ENCOUNTER — Other Ambulatory Visit: Payer: Self-pay

## 2020-11-24 ENCOUNTER — Encounter (HOSPITAL_COMMUNITY): Payer: Self-pay | Admitting: Emergency Medicine

## 2020-11-24 ENCOUNTER — Emergency Department (HOSPITAL_COMMUNITY): Payer: Medicare Other

## 2020-11-24 ENCOUNTER — Emergency Department (HOSPITAL_COMMUNITY)
Admission: EM | Admit: 2020-11-24 | Discharge: 2020-11-25 | Disposition: A | Discharge status: Home / Self Care | Payer: Medicare Other | Attending: Emergency Medicine | Admitting: Emergency Medicine

## 2020-11-24 DIAGNOSIS — Z5321 Procedure and treatment not carried out due to patient leaving prior to being seen by health care provider: Secondary | ICD-10-CM | POA: Diagnosis not present

## 2020-11-24 DIAGNOSIS — K5900 Constipation, unspecified: Secondary | ICD-10-CM | POA: Diagnosis not present

## 2020-11-24 LAB — CBC WITH DIFFERENTIAL/PLATELET
Abs Immature Granulocytes: 0.01 10*3/uL (ref 0.00–0.07)
Basophils Absolute: 0 10*3/uL (ref 0.0–0.1)
Basophils Relative: 1 %
Eosinophils Absolute: 0.1 10*3/uL (ref 0.0–0.5)
Eosinophils Relative: 2 %
HCT: 41.3 % (ref 36.0–46.0)
Hemoglobin: 13.2 g/dL (ref 12.0–15.0)
Immature Granulocytes: 0 %
Lymphocytes Relative: 23 %
Lymphs Abs: 1 10*3/uL (ref 0.7–4.0)
MCH: 31.9 pg (ref 26.0–34.0)
MCHC: 32 g/dL (ref 30.0–36.0)
MCV: 99.8 fL (ref 80.0–100.0)
Monocytes Absolute: 0.6 10*3/uL (ref 0.1–1.0)
Monocytes Relative: 12 %
Neutro Abs: 2.9 10*3/uL (ref 1.7–7.7)
Neutrophils Relative %: 62 %
Platelets: 199 10*3/uL (ref 150–400)
RBC: 4.14 MIL/uL (ref 3.87–5.11)
RDW: 13.5 % (ref 11.5–15.5)
WBC: 4.6 10*3/uL (ref 4.0–10.5)
nRBC: 0 % (ref 0.0–0.2)

## 2020-11-24 LAB — URINALYSIS, ROUTINE W REFLEX MICROSCOPIC
Bilirubin Urine: NEGATIVE
Glucose, UA: NEGATIVE mg/dL
Hgb urine dipstick: NEGATIVE
Ketones, ur: NEGATIVE mg/dL
Leukocytes,Ua: NEGATIVE
Nitrite: NEGATIVE
Protein, ur: NEGATIVE mg/dL
Specific Gravity, Urine: 1.009 (ref 1.005–1.030)
pH: 5 (ref 5.0–8.0)

## 2020-11-24 LAB — COMPREHENSIVE METABOLIC PANEL
ALT: 18 U/L (ref 0–44)
AST: 24 U/L (ref 15–41)
Albumin: 3.8 g/dL (ref 3.5–5.0)
Alkaline Phosphatase: 95 U/L (ref 38–126)
Anion gap: 8 (ref 5–15)
BUN: 11 mg/dL (ref 8–23)
CO2: 26 mmol/L (ref 22–32)
Calcium: 8.9 mg/dL (ref 8.9–10.3)
Chloride: 101 mmol/L (ref 98–111)
Creatinine, Ser: 0.79 mg/dL (ref 0.44–1.00)
GFR, Estimated: >60 mL/min (ref >60)
Glucose, Bld: 89 mg/dL (ref 70–99)
Potassium: 4.4 mmol/L (ref 3.5–5.1)
Sodium: 135 mmol/L (ref 135–145)
Total Bilirubin: 0.7 mg/dL (ref 0.3–1.2)
Total Protein: 6.7 g/dL (ref 6.5–8.1)

## 2020-11-24 LAB — LIPASE, BLOOD: Lipase: 40 U/L (ref 11–51)

## 2020-11-24 NOTE — ED Provider Notes (Signed)
Emergency Medicine Provider Triage Evaluation Note  Bridget [name] , a 87 y.o. female  was evaluated in triage.  Brought to the emergency department by her daughter.  Daughter reports that patient has had constipation over the last 3 days.  Patient has been given stool softeners and laxatives with no improvement.  Daughter states that she she is a "bulge," in her rectum.  Today patient began complaining of abdominal pain.  Patient has history of hysterectomy.  Review of Systems  Positive:  Negative:   Unable to obtain due to dementia  Physical Exam  BP (!) 147/93 (BP Location: Right Arm)   Pulse 81   Temp 97.7 F (36.5 C) (Oral)   Resp 18   SpO2 97%  Gen:   Awake, no distress   Resp:  Normal effort  MSK:   Moves extremities without difficulty  Other:  Abdomen soft, distended, nontender.  No guarding or rebound tenderness.  Medical Decision Making  Medically screening exam initiated at 9:47 PM.  Appropriate orders placed.  Bridget [name] was informed that the remainder of the evaluation will be completed by another provider, this initial triage assessment does not replace that evaluation, and the importance of remaining in the ED until their evaluation is complete.     Loni Beckwith, PA-C 11/24/20 [id number]    Drenda Freeze, MD 11/25/20 2322

## 2020-11-24 NOTE — ED Triage Notes (Signed)
Patient reports mid abdominal pain / constipation last BM 3 days ago unrelieved by OTC stool softener/laxative .

## 2020-11-25 NOTE — ED Notes (Signed)
PT left AMA 

## 2020-12-14 ENCOUNTER — Ambulatory Visit (INDEPENDENT_AMBULATORY_CARE_PROVIDER_SITE_OTHER): Payer: Medicare Other | Admitting: Pulmonary Disease

## 2020-12-14 ENCOUNTER — Telehealth: Payer: Self-pay | Admitting: Pulmonary Disease

## 2020-12-14 DIAGNOSIS — J471 Bronchiectasis with (acute) exacerbation: Secondary | ICD-10-CM

## 2020-12-14 MED ORDER — DOXYCYCLINE HYCLATE 100 MG PO TABS: 100.0000 mg | ORAL_TABLET | Freq: Two times a day (BID) | ORAL | 0 refills | Status: DC

## 2020-12-14 NOTE — Patient Instructions (Addendum)
Bronchiectasis with chronic bronchitis Bronchiectasis exacerbation --CONTINUE Advair 115-21 mcg TWO puffs TWICE a day --START docycycline 100 mg BID  Left pleural lung nodules --CT Chest without contrast scheduled in December. Will discuss results in January 2023  Follow-up on 01/12/20

## 2020-12-14 NOTE — Progress Notes (Signed)
Virtual Visit via Telephone Note  I connected with Bridget [name] on 12/14/20 at  4:00 PM EST by telephone and verified that I am speaking with the correct person using two identifiers.  Location: Patient: Home Provider: [facility]   I discussed the limitations, risks, security and privacy concerns of performing an evaluation and management service by telephone and the availability of in person appointments. I also discussed with the patient that there may be a patient responsible charge related to this service. The patient expressed understanding and agreed to proceed.   I discussed the assessment and treatment plan with the patient. The patient was provided an opportunity to ask questions and all were answered. The patient agreed with the plan and demonstrated an understanding of the instructions.   The patient was advised to call back or seek an in-person evaluation if the symptoms worsen or if the condition fails to improve as anticipated.  I provided 25 minutes of non-face-to-face time during this encounter.   Ankith Edmonston Rodman Pickle, MD   Subjective:   PATIENT ID: Bridget [name] GENDER: female DOB: 1921/02/23, MRN: [id number]   HPI  No chief complaint on file.   Reason for Visit: Follow-up bronchiectasis  Bridget [name] is a 87 year old female former smoker with dementia, atrial fibrillation, left thrombus of atrial appendage, hx breast cancer s/p lumpectomy, osteoporosis and bronchiectasis.  Synopsis: She has previously seen a Pulmonologist 38 years ago for hemoptysis that resolved after initial presentation. Attributed to bronchiectasis and she was treated with a slant board. She has a chronic cough associated with white sputum that equates to at least 2-3 tablespoons. Denies hemoptysis. Denies wheezing or shortness of breath. She has a history of whooping cough as a child.  12/14/20 Daughter is on line with patient to provide additional history. Since our last visit she  had COVID-19 in October and recovered fully to baseline symptoms after five days. She has increased cough and sputum production up to 1/3 of dixie cup in the last few weeks. Color has changed to yellow in the last two days. Denies fevers, chills. No shortness of breath or wheezing.  Social History: Former smoker. Smoked 1/2 ppd x 15 years. Quit in 1950   Past Medical History:  Diagnosis Date   Atherosclerosis of aorta (H[facility])    Atrial fibrillation ([place])    Breast cancer ([place])    Coronary atherosclerosis due to calcified coronary lesion    Stroke ([place])    Thrombus of left atrial appendage      Family History  Problem Relation Age of Onset   Other Sister        TRAUMA TO HEAD AFTER A FALL     Social History   Occupational History   Not on file  Tobacco Use   Smoking status: Former    Types: Cigarettes    Quit date: 1950    Years since quitting: 72.9   Smokeless tobacco: Never  Vaping Use   Vaping Use: Never used  Substance and Sexual Activity   Alcohol use: Never   Drug use: Never   Sexual activity: Not on file    Allergies  Allergen Reactions   Erythromycin     Unknown, but "all the mycins bother me"   Oxycodone Other (See Comments)    confusion   Tramadol Other (See Comments)    confusion     Outpatient Medications Prior to Visit  Medication Sig Dispense Refill   acetaminophen (TYLENOL) 500 MG  tablet Take 1 tablet by mouth daily.     apixaban (ELIQUIS) 5 MG TABS tablet Take 1 tablet (5 mg total) by mouth 2 (two) times daily. 180 tablet 1   Cranberry 250 MG CAPS Take 1 capsule by mouth daily.     Cyanocobalamin (B-12) 1000 MCG TABS Take 1 tablet by mouth daily.     denosumab (PROLIA) 60 MG/ML SOSY injection Inject 60 mg into the skin See admin instructions. Every 6 months     diclofenac Sodium (VOLTAREN) 1 % GEL Apply topically 4 (four) times daily.     digoxin (LANOXIN) 0.125 MG tablet Take 0.5 tablets by mouth daily.     diltiazem (TIAZAC) 180 MG 24 hr  capsule Take 180 mg by mouth daily.     docusate sodium (COLACE) 100 MG capsule as needed.     ergocalciferol (VITAMIN D2) 1.25 MG (50000 UT) capsule Take 1 capsule by mouth daily.     escitalopram (LEXAPRO) 10 MG tablet Take 10 mg by mouth daily.     fluticasone-salmeterol (ADVAIR HFA) 115-21 MCG/ACT inhaler Inhale 2 puffs into the lungs 2 (two) times daily. 1 each 12   ketoconazole (NIZORAL) 2 % shampoo See admin instructions.     Lactobacillus (ACIDOPHILUS PROBIOTIC) 100 MG CAPS Take 1 capsule by mouth daily.     memantine (NAMENDA) 10 MG tablet Take 1 tablet by mouth 2 (two) times daily.     Multiple Vitamins-Minerals (PRESERVISION AREDS 2) CAPS Take 1 capsule by mouth in the morning and at bedtime.     No facility-administered medications prior to visit.    Review of Systems  Constitutional:  Negative for chills, diaphoresis, fever, malaise/fatigue and weight loss.  HENT:  Negative for congestion.   Respiratory:  Positive for cough and sputum production. Negative for hemoptysis, shortness of breath and wheezing.   Cardiovascular:  Negative for chest pain, palpitations and leg swelling.    Objective:   There were no vitals filed for this visit.     Physical Exam: General: No distress Respiratory: No respiratory distress  Data Reviewed:  Imaging: CT Chest 08/17/20 - Scattered cylindrical bronchiectasis with interstitium and peribronchovascular ground glass attenuation and micro-nodularity. Complete atelectasis in the RML. LLL pleural nodules measuring 9 x 15mm and 11 x 8 mm nodules  PFT: None on file  Labs: CBC    Component Value Date/Time   WBC 4.6 11/24/2020 2157   RBC 4.14 11/24/2020 2157   HGB 13.2 11/24/2020 2157   HGB 12.5 08/28/2020 1402   HCT 41.3 11/24/2020 2157   HCT 36.6 08/28/2020 1402   PLT 199 11/24/2020 2157   MCV 99.8 11/24/2020 2157   MCH 31.9 11/24/2020 2157   MCHC 32.0 11/24/2020 2157   RDW 13.5 11/24/2020 2157   LYMPHSABS 1.0 11/24/2020 2157    MONOABS 0.6 11/24/2020 2157   EOSABS 0.1 11/24/2020 2157   BASOSABS 0.0 11/24/2020 2157  Normal hematocrit  Assessment & Plan:   Discussion: 87 year old female former smoker with dementia, atrial fibrillation, left thrombus of atrial appendage, hx breast cancer s/p lumpectomy, osteoporosis who presents for follow-up.    Bronchiectasis with chronic bronchitis Bronchiectasis exacerbation --CONTINUE Advair 115-21 mcg TWO puffs TWICE a day --START docycycline 100 mg BID  Left pleural lung nodules --CT Chest without contrast scheduled in December. Will discuss results in January 2023  Health Maintenance Immunization History  Administered Date(s) Administered   Influenza, High Dose Seasonal PF 09/06/2017   Influenza, Quadrivalent, Recombinant, Inj, Pf 09/30/2018, 10/31/2019,  09/12/2020   Moderna Sars-Covid-2 Vaccination 01/07/2019, 02/04/2019, 11/18/2019   CT Lung Screen - Not qualified. >15 years after tobacco cessation  No orders of the defined types were placed in this encounter. Meds ordered this encounter  Medications   doxycycline (VIBRA-TABS) 100 MG tablet    Sig: Take 1 tablet (100 mg total) by mouth 2 (two) times daily.    Dispense:  14 tablet    Refill:  0    No follow-ups on file.  I have spent a total time of 45-minutes on the day of the appointment reviewing prior documentation, coordinating care and discussing medical diagnosis and plan with the patient/family. Imaging, labs and tests included in this note have been reviewed and interpreted independently by me.  [place], MD [facility] 12/14/2020 4:12 PM  Office Number [phone]

## 2020-12-14 NOTE — Telephone Encounter (Signed)
Spoke with Kennyth Lose (daughter per [facility]) who states pt productive cough sound tight. Mucus is gray in color and thick. Kennyth Lose denies pt having fever/ chills/ GI upset/ SOB/ wheezing. Pt is using Advair as directed and is also on Eliquis. Dr. Loanne Drilling please advise.

## 2020-12-14 NOTE — Telephone Encounter (Signed)
Patient has an OV with provider on 12/14/20. Nothing further needed.

## 2020-12-19 ENCOUNTER — Other Ambulatory Visit: Payer: Self-pay

## 2020-12-19 ENCOUNTER — Ambulatory Visit
Admission: RE | Admit: 2020-12-19 | Discharge: 2020-12-19 | Disposition: A | Discharge status: Home / Self Care | Payer: Medicare Other | Source: Ambulatory Visit | Attending: Pulmonary Disease | Admitting: Pulmonary Disease

## 2020-12-19 DIAGNOSIS — R918 Other nonspecific abnormal finding of lung field: Secondary | ICD-10-CM

## 2020-12-20 ENCOUNTER — Encounter: Payer: Self-pay | Admitting: Podiatry

## 2020-12-20 ENCOUNTER — Ambulatory Visit (INDEPENDENT_AMBULATORY_CARE_PROVIDER_SITE_OTHER): Payer: Medicare Other | Admitting: Podiatry

## 2020-12-20 DIAGNOSIS — M79675 Pain in left toe(s): Secondary | ICD-10-CM | POA: Diagnosis not present

## 2020-12-20 DIAGNOSIS — M79674 Pain in right toe(s): Secondary | ICD-10-CM | POA: Diagnosis not present

## 2020-12-20 DIAGNOSIS — I2584 Coronary atherosclerosis due to calcified coronary lesion: Secondary | ICD-10-CM | POA: Diagnosis not present

## 2020-12-20 DIAGNOSIS — B351 Tinea unguium: Secondary | ICD-10-CM | POA: Diagnosis not present

## 2020-12-20 DIAGNOSIS — I2510 Atherosclerotic heart disease of native coronary artery without angina pectoris: Secondary | ICD-10-CM

## 2020-12-21 NOTE — Progress Notes (Signed)
Subjective:   Patient ID: Bridget [name], female   DOB: 87 y.o.   MRN: [id number]   HPI Patient presents with caregiver with elongated nailbeds 1-5 both feet that are thick and painful and states that she cannot cut herself.  Patient is in relatively good mental state does not smoke likes to be active if possible but is not due to a advanced age   Review of Systems  All other systems reviewed and are negative.      Objective:  Physical Exam Vitals and nursing note reviewed.  Constitutional:      Appearance: She is well-developed.  Pulmonary:     Effort: Pulmonary effort is normal.  Musculoskeletal:        General: Normal range of motion.  Skin:    General: Skin is warm.  Neurological:     Mental Status: She is alert.    Neurovascular status found to be intact for her age with patient found to have reasonable muscle strength and range of motion.  She does have severely thickened nailbeds 1-5 both feet that are dystrophic and they are painful when pressed and make shoe gear difficult for her.  She has good digital perfusion well oriented     Assessment:  Chronic mycotic nail infection 1-5 both feet with pain     Plan:  H&P reviewed condition discussed treatment options with her and caregiver.  Today debridement of nailbeds with sterile instruments 1-5 both feet slight bleeding left hallux medial border and applied dressing small amount of Neosporin and instructed to watch this but should heal uneventfully and advised her on routine care in the future to keep the nails under control educated them on how this can be done

## 2020-12-24 ENCOUNTER — Ambulatory Visit: Payer: Medicare Other | Admitting: Cardiology

## 2020-12-25 ENCOUNTER — Other Ambulatory Visit: Payer: Self-pay

## 2020-12-25 ENCOUNTER — Ambulatory Visit: Payer: Medicare Other | Admitting: Cardiology

## 2020-12-25 ENCOUNTER — Encounter: Payer: Self-pay | Admitting: Cardiology

## 2020-12-25 VITALS — BP 123/58 | HR 76 | Resp 16 | Ht 67.0 in | Wt 128.4 lb

## 2020-12-25 DIAGNOSIS — I513 Intracardiac thrombosis, not elsewhere classified: Secondary | ICD-10-CM

## 2020-12-25 DIAGNOSIS — I4821 Permanent atrial fibrillation: Secondary | ICD-10-CM

## 2020-12-25 DIAGNOSIS — Z87891 Personal history of nicotine dependence: Secondary | ICD-10-CM

## 2020-12-25 DIAGNOSIS — R931 Abnormal findings on diagnostic imaging of heart and coronary circulation: Secondary | ICD-10-CM

## 2020-12-25 DIAGNOSIS — I700 Atherosclerosis of aorta: Secondary | ICD-10-CM

## 2020-12-25 DIAGNOSIS — I2510 Atherosclerotic heart disease of native coronary artery without angina pectoris: Secondary | ICD-10-CM

## 2020-12-25 DIAGNOSIS — Z7901 Long term (current) use of anticoagulants: Secondary | ICD-10-CM

## 2020-12-25 DIAGNOSIS — Z8673 Personal history of transient ischemic attack (TIA), and cerebral infarction without residual deficits: Secondary | ICD-10-CM

## 2020-12-25 NOTE — Progress Notes (Signed)
Date:  12/25/2020   ID:  Bridget [name], DOB 03-20-1921, MRN [id number]  PCP:  Haywood Pao, MD  Cardiologist:  Rex Kras, DO, [facility]  (established care 08/28/2020)  Date: 12/25/20 Last Office Visit: 09/20/2020  Chief Complaint  Patient presents with   Atrial Fibrillation   Follow-up    HPI  Bridget [name] is a 87 y.o. female who presents to the office with a chief complaint of " management of atrial fibrillation and thrombus in the left atrial appendage." Patient's past medical history and cardiovascular risk factors include: Atrial fibrillation (permanent, per daughter), hx of stroke (per daughter in 2017), history of breast cancer, osteoporosis bilateral hip fractures, dementia (per daughter), thrombus of the left atrial appendage (CT scan ), pulmonary nodule, advanced age, post menopausal female.   She is referred to the office at the request of Tisovec, Fransico Him, MD for evaluation of atrial fibrillation.  Patient is accompanied by her daughter Kennyth Lose at today's visit.  Patient recently moved from [place] to [place] to be closer to family.  Given her chronic cough in the past underwent CT for further evaluation she was noted to have incidental left atrial appendage thrombus and referred to cardiology for further evaluation and management.  Patient has known history of atrial fibrillation likely permanent and has been on oral anticoagulation with Pradaxa prior to establishing care.  Given the findings of the CT chest with contrast we discussed undergoing transesophageal echocardiogram.  However the shared decision between the patient and family was to hold off on additional invasive testing given her age which was quite appropriate.  At the last office visit we transitioned her from Pradaxa to Eliquis and she has tolerated the medication well without any side effects or intolerances.  She recently had a CT of the chest without contrast prior to this office visit which was  reviewed with her in her daughter in detail.  With regards to the management of atrial fibrillation and was diagnosed approximately 10 years ago while in [place], no prior history of cardioversion or atrial fibrillation ablation.  No history of intracranial bleeding or gastrointestinal bleeding.  Patient has been on oral anticoagulation since 2017 without any overt complications.  According to the patient's daughter her weight relatively stays above 60 kg.  FUNCTIONAL STATUS: Works with physical therapy for 45 minutes 3 times a week and on colder days and goes out for a walk with her daughter Kennyth Lose.  ALLERGIES: Allergies  Allergen Reactions   Erythromycin     Unknown, but "all the mycins bother me"   Oxycodone Other (See Comments)    confusion   Tramadol Other (See Comments)    confusion    MEDICATION LIST PRIOR TO VISIT: Current Meds  Medication Sig   acetaminophen (TYLENOL) 500 MG tablet Take 1 tablet by mouth daily.   apixaban (ELIQUIS) 5 MG TABS tablet Take 1 tablet (5 mg total) by mouth 2 (two) times daily.   Cholecalciferol (VITAMIN D3) 1.25 MG (50000 UT) CAPS Take by mouth.   Cranberry 250 MG CAPS Take 1 capsule by mouth daily.   Cyanocobalamin (B-12) 1000 MCG TABS Take 1 tablet by mouth daily.   denosumab (PROLIA) 60 MG/ML SOSY injection Inject 60 mg into the skin See admin instructions. Every 6 months   diclofenac Sodium (VOLTAREN) 1 % GEL Apply topically 4 (four) times daily.   diltiazem (TIAZAC) 180 MG 24 hr capsule Take 180 mg by mouth daily.   docusate sodium (COLACE) 100 MG  capsule as needed.   doxycycline (VIBRA-TABS) 100 MG tablet Take 1 tablet (100 mg total) by mouth 2 (two) times daily.   fluticasone-salmeterol (ADVAIR HFA) 115-21 MCG/ACT inhaler Inhale 2 puffs into the lungs 2 (two) times daily.   ketoconazole (NIZORAL) 2 % shampoo See admin instructions.   Lactobacillus (ACIDOPHILUS PROBIOTIC) 100 MG CAPS Take 1 capsule by mouth daily.   memantine (NAMENDA) 10  MG tablet Take 1 tablet by mouth 2 (two) times daily.   Multiple Vitamins-Minerals (PRESERVISION AREDS 2) CAPS Take 1 capsule by mouth in the morning and at bedtime.   Omega-3 Fatty Acids (OMEGA-3 FISH OIL PO) Take 1 tablet by mouth daily at 12 noon.   [DISCONTINUED] digoxin (LANOXIN) 0.125 MG tablet Take 0.5 tablets by mouth daily.     PAST MEDICAL HISTORY: Past Medical History:  Diagnosis Date   Atherosclerosis of aorta (HCC)    Atrial fibrillation ([place])    Breast cancer ([place])    Coronary atherosclerosis due to calcified coronary lesion    Stroke ([place])    Thrombus of left atrial appendage     PAST SURGICAL HISTORY: Past Surgical History:  Procedure Laterality Date   BREAST LUMPECTOMY Left 2001   FEMUR FRACTURE SURGERY Right 2019   RIGHT - 2015   TOTAL ABDOMINAL HYSTERECTOMY  1967    FAMILY HISTORY: The patient family history includes Other in her sister.  SOCIAL HISTORY:  The patient  reports that she quit smoking about 73 years ago. Her smoking use included cigarettes. She has never used smokeless tobacco. She reports that she does not drink alcohol and does not use drugs.  REVIEW OF SYSTEMS: Review of Systems  Constitutional: Negative for chills and fever.  HENT:  Negative for hoarse voice and nosebleeds.   Eyes:  Negative for discharge, double vision and pain.  Cardiovascular:  Negative for chest pain, claudication, dyspnea on exertion, leg swelling, near-syncope, orthopnea, palpitations, paroxysmal nocturnal dyspnea and syncope.  Respiratory:  Negative for hemoptysis and shortness of breath.   Musculoskeletal:  Negative for muscle cramps and myalgias.  Gastrointestinal:  Negative for abdominal pain, constipation, diarrhea, hematemesis, hematochezia, melena, nausea and vomiting.  Neurological:  Negative for dizziness and light-headedness.   PHYSICAL EXAM: Vitals with BMI 12/25/2020 11/25/2020 11/24/2020  Height $Remov'5\' 7"'QmyEIp$  - -  Weight 128 lbs 6 oz - -  BMI 16.10 - -   Systolic 960 454 098  Diastolic 58 82 93  Pulse 76 104 81    CONSTITUTIONAL: Age-appropriate female, hemodynamically stable, well-developed and well-nourished. No acute distress.  SKIN: Skin is warm and dry. No rash noted. No cyanosis. No pallor. No jaundice HEAD: Normocephalic and atraumatic.  EYES: No scleral icterus MOUTH/THROAT: Moist oral membranes.  NECK: No JVD present. No thyromegaly noted. No carotid bruits  LYMPHATIC: No visible cervical adenopathy.  CHEST Normal respiratory effort. No intercostal retractions  LUNGS: Clear to auscultation bilaterally.  No stridor. No wheezes. No rales.  CARDIOVASCULAR: Irregularly irregular, soft holosystolic murmur heard at the apex, variable S1-S2, no gallops or rubs. ABDOMINAL: Soft, nontender, nondistended, positive bowel sounds in all 4 quadrants, no apparent ascites.  EXTREMITIES: No peripheral edema, 2+ DP and PT pulses. HEMATOLOGIC: No significant bruising NEUROLOGIC: Oriented to person, place, and time. Nonfocal. Normal muscle tone.  PSYCHIATRIC: Normal mood and affect. Normal behavior. Cooperative  RADIOLOGY: CT chest with contrast: 08/17/2020 IMPRESSION: 1. Scattered areas of bronchiectasis are noted in the lungs bilaterally with associated chronic scarring/atelectasis in the right middle lobe. Notably, there also 2  large pulmonary nodules in the posterior aspect of the left lower lobe. These are favored to be of infectious or inflammatory etiology, but underlying neoplasm is difficult to entirely exclude. Accordingly, short-term repeat chest CT is recommended 1-2 months to ensure the stability or regression of these findings. 2. Cardiomegaly with biatrial dilatation. Notably, there is a large filling defect in the tip of the left atrial appendage highly concerning for left atrial appendage thrombus. If present, this places the patient at high risk for systemic embolization. Further evaluation with transesophageal echocardiography is  recommended if clinically appropriate. 3. Trace left pleural effusion lying dependently. 4. Aortic atherosclerosis, in addition to left main and 3 vessel coronary artery disease. 5. There are calcifications of the aortic valve and mitral annulus. Echocardiographic correlation for evaluation of potential valvular dysfunction may be warranted if clinically indicated.  CT chest without contrast 12/21/2020: 1. Pulmonary nodules in the posterior left lower lobe have resolved since 08/17/2020 and compatible with resolved infection or inflammation. However, there are multiple new areas of airspace disease throughout both lungs, most prominent in the upper lobes. Findings are most compatible with infectious or inflammatory changes. In addition, there are scattered areas of bronchiectasis with areas of mucoid impaction. 2. Chronic collapse of the right middle lobe. 3. Cardiomegaly. 4.  Aortic Atherosclerosis (ICD10-I70.0).  CARDIAC DATABASE: EKG: 08/28/2020: Atrial fibrillation, 78 bpm, right bundle branch block, without underlying injury pattern.    Echocardiogram: 09/13/2020: LVEF 50-55%, no regional wall motion abnormalities, diastolic dysfunction not evaluated due to underlying rhythm being atrial fibrillation, biatrial dilatation, moderate MR, moderate TR, no pulmonary hypertension, aortic plaque within the descending aorta.   LABORATORY DATA: External Labs: Collected: 05/02/2020 Creatinine 0.8 mg/dL. eGFR: 22mL/min per 1.73 m AST 18, ALT 10, alkaline phosphatase 61 Hemoglobin 13.3 g/dL, hematocrit 39.7  Collected: 10/31/2019: Hemoglobin 14.6 g/dL, hematocrit 44.2%  IMPRESSION:    ICD-10-CM   1. Thrombus of left atrial appendage  I51.3 CT CHEST W CONTRAST    Hemoglobin and hematocrit, blood    Basic metabolic panel    2. Abnormal findings on diagnostic imaging of heart and coronary circulation  R93.1 CT CHEST W CONTRAST    3. Long term (current) use of anticoagulants  Z79.01  Hemoglobin and hematocrit, blood    4. Permanent atrial fibrillation (HCC)  H88.50 Basic metabolic panel    CANCELED: EKG 12-Lead    5. History of stroke  Z86.73     6. Coronary atherosclerosis due to calcified coronary lesion  I25.10    I25.84     7. Atherosclerosis of aorta (HCC)  I70.0     8. Former smoker  Z87.891        RECOMMENDATIONS: Bridget [name] is a 87 y.o. female whose past medical history and cardiac risk factors include: Atrial fibrillation (permanent, per daughter), hx of stroke (per daughter in 27), history of breast cancer, osteoporosis bilateral hip fractures, dementia (per daughter), thrombus of the left atrial appendage (CT scan ), pulmonary nodule, advanced age, post menopausal female.   Thrombus of left atrial appendage Noted on recent CT of the chest with contrast - 08/2020. Patient as well as the family have decided against transesophageal echocardiogram. Transitioned from Pradaxa to Eliquis 09/20/2020 She has completed 3 months of oral anticoagulation without any intolerances or evidence of bleeding. She had a CT chest without contrast results reviewed as well as the images however unable to comment on the left atrial appendage. Will order CT chest with contrast to evaluate for the presence/absence  of left atrial appendage thrombus. We will call her with the results of the CT scan.  Permanent atrial fibrillation (HCC) Rate control: Diltiazem Rhythm control: N/A Thromboembolic prophylaxis: We will transition to Eliquis. CHA2DS2-VASc SCORE is 6 which correlates to 9.7% risk of stroke per year (age, gender, history of stroke, aortic atherosclerosis).  Patient remains in permanent atrial fibrillation.  We will discontinue digoxin given the potential side effects of dig toxicity at her age.  Long term (current) use of anticoagulants Transitioned from Pradaxa to Eliquis 09/20/2020. Has tolerated Eliquis well without any side effects and does not endorse any  evidence of bleeding. Will continue the current dose of Eliquis unless if she consistently has weights less than 60 kg.  If there is happens patient is daughter is encouraged to call the office so the dose of Eliquis can be titrated.  FINAL MEDICATION LIST END OF ENCOUNTER: No orders of the defined types were placed in this encounter.    Medications Discontinued During This Encounter  Medication Reason   ergocalciferol (VITAMIN D2) 1.25 MG (50000 UT) capsule    escitalopram (LEXAPRO) 10 MG tablet    digoxin (LANOXIN) 0.125 MG tablet Discontinued by provider     Current Outpatient Medications:    acetaminophen (TYLENOL) 500 MG tablet, Take 1 tablet by mouth daily., Disp: , Rfl:    apixaban (ELIQUIS) 5 MG TABS tablet, Take 1 tablet (5 mg total) by mouth 2 (two) times daily., Disp: 180 tablet, Rfl: 1   Cholecalciferol (VITAMIN D3) 1.25 MG (50000 UT) CAPS, Take by mouth., Disp: , Rfl:    Cranberry 250 MG CAPS, Take 1 capsule by mouth daily., Disp: , Rfl:    Cyanocobalamin (B-12) 1000 MCG TABS, Take 1 tablet by mouth daily., Disp: , Rfl:    denosumab (PROLIA) 60 MG/ML SOSY injection, Inject 60 mg into the skin See admin instructions. Every 6 months, Disp: , Rfl:    diclofenac Sodium (VOLTAREN) 1 % GEL, Apply topically 4 (four) times daily., Disp: , Rfl:    diltiazem (TIAZAC) 180 MG 24 hr capsule, Take 180 mg by mouth daily., Disp: , Rfl:    docusate sodium (COLACE) 100 MG capsule, as needed., Disp: , Rfl:    doxycycline (VIBRA-TABS) 100 MG tablet, Take 1 tablet (100 mg total) by mouth 2 (two) times daily., Disp: 14 tablet, Rfl: 0   fluticasone-salmeterol (ADVAIR HFA) 115-21 MCG/ACT inhaler, Inhale 2 puffs into the lungs 2 (two) times daily., Disp: 1 each, Rfl: 12   ketoconazole (NIZORAL) 2 % shampoo, See admin instructions., Disp: , Rfl:    Lactobacillus (ACIDOPHILUS PROBIOTIC) 100 MG CAPS, Take 1 capsule by mouth daily., Disp: , Rfl:    memantine (NAMENDA) 10 MG tablet, Take 1 tablet by mouth  2 (two) times daily., Disp: , Rfl:    Multiple Vitamins-Minerals (PRESERVISION AREDS 2) CAPS, Take 1 capsule by mouth in the morning and at bedtime., Disp: , Rfl:    Omega-3 Fatty Acids (OMEGA-3 FISH OIL PO), Take 1 tablet by mouth daily at 12 noon., Disp: , Rfl:   Orders Placed This Encounter  Procedures   CT CHEST W CONTRAST   Hemoglobin and hematocrit, blood   Basic metabolic panel    There are no Patient Instructions on file for this visit.   --Continue cardiac medications as reconciled in final medication list. --Return in about 6 months (around 06/25/2021) for Follow up, A. fib, LAA thrombus. . Or sooner if needed. --Continue follow-up with your primary care physician regarding the  management of your other chronic comorbid conditions.  Patient's questions and concerns were addressed to her satisfaction. She voices understanding of the instructions provided during this encounter.   This note was created using a voice recognition software as a result there may be grammatical errors inadvertently enclosed that do not reflect the nature of this encounter. Every attempt is made to correct such errors.  Rex Kras, [place], [facility]  Pager: [phone] Office: [phone]

## 2020-12-26 ENCOUNTER — Encounter (INDEPENDENT_AMBULATORY_CARE_PROVIDER_SITE_OTHER): Payer: Medicare Other | Admitting: Ophthalmology

## 2020-12-27 ENCOUNTER — Encounter (INDEPENDENT_AMBULATORY_CARE_PROVIDER_SITE_OTHER): Payer: Medicare Other | Admitting: Ophthalmology

## 2020-12-27 ENCOUNTER — Other Ambulatory Visit: Payer: Self-pay

## 2020-12-27 DIAGNOSIS — I10 Essential (primary) hypertension: Secondary | ICD-10-CM

## 2020-12-27 DIAGNOSIS — H43813 Vitreous degeneration, bilateral: Secondary | ICD-10-CM

## 2020-12-27 DIAGNOSIS — H353231 Exudative age-related macular degeneration, bilateral, with active choroidal neovascularization: Secondary | ICD-10-CM

## 2020-12-27 DIAGNOSIS — H35033 Hypertensive retinopathy, bilateral: Secondary | ICD-10-CM | POA: Diagnosis not present

## 2021-01-11 ENCOUNTER — Other Ambulatory Visit: Payer: Self-pay

## 2021-01-11 ENCOUNTER — Ambulatory Visit (INDEPENDENT_AMBULATORY_CARE_PROVIDER_SITE_OTHER): Payer: Medicare Other | Admitting: Pulmonary Disease

## 2021-01-11 ENCOUNTER — Encounter: Payer: Self-pay | Admitting: Pulmonary Disease

## 2021-01-11 ENCOUNTER — Telehealth: Payer: Self-pay

## 2021-01-11 VITALS — BP 118/70 | HR 77 | Ht 67.0 in | Wt 134.2 lb

## 2021-01-11 DIAGNOSIS — J479 Bronchiectasis, uncomplicated: Secondary | ICD-10-CM

## 2021-01-11 MED ORDER — FORMOTEROL FUMARATE 20 MCG/2ML IN NEBU: 20.0000 ug | INHALATION_SOLUTION | Freq: Two times a day (BID) | RESPIRATORY_TRACT | 2 refills | Status: DC

## 2021-01-11 MED ORDER — BUDESONIDE 0.5 MG/2ML IN SUSP: 0.5000 mg | Freq: Every day | RESPIRATORY_TRACT | 12 refills | Status: DC

## 2021-01-11 NOTE — Telephone Encounter (Signed)
Spk with pt's daughter advised her that [facility] needed pt's medicare part b card in order to see correct pricing on pulmicort $142 and performist $550. Prices listed are w/o updated insurance on file. If pt agrees to prices please place order for new nebulizer machine per JE

## 2021-01-11 NOTE — Patient Instructions (Addendum)
Bronchiectasis with chronic bronchitis --CONTINUE Advair 115-21 mcg TWO puffs TWICE a day. Use with a spacer --ORDER Pulmicort and Performist. If pricing is acceptable then, will order nebulizer  Left pleural lung nodules - resolved Bilateral upper lobe opacities likely pneumonia s/p treatment No active infection at this time --No indication for further antibiotics  Follow-up in 3 months with me

## 2021-01-11 NOTE — Progress Notes (Signed)
Subjective:   PATIENT ID: Bridget [name] GENDER: female DOB: January 03, 1922, MRN: [id number]   HPI  Chief Complaint  Patient presents with   Follow-up    Advair causes her to cough up flem    Reason for Visit: Follow-up bronchiectasis  Ms. Bridget [name] is a 88 year old female former smoker with dementia, atrial fibrillation, left thrombus of atrial appendage, hx breast cancer s/p lumpectomy, osteoporosis and bronchiectasis.  Synopsis: She has previously seen a Pulmonologist 38 years ago for hemoptysis that resolved after initial presentation. Attributed to bronchiectasis and she was treated with a slant board. She has a chronic cough associated with white sputum that equates to at least 2-3 tablespoons. Denies hemoptysis. Denies wheezing or shortness of breath. She has a history of whooping cough as a child.  12/14/20 Daughter is on line with patient to provide additional history. Since our last visit she had COVID-19 in October and recovered fully to baseline symptoms after five days. She has increased cough and sputum production up to 1/3 of dixie cup in the last few weeks. Color has changed to yellow in the last two days. Denies fevers, chills. No shortness of breath or wheezing.  01/11/21  Daughter provides history. Since our last visit after antibiotics her sputum improved but still had productive cough.  She has been compliant with Advair however seems to have a more vigorous cough afterwards. Cough in the morning with inhaler at night. Throughout the day she has no coughing.   Social History: Former smoker. Smoked 1/2 ppd x 15 years. Quit in 1950   Past Medical History:  Diagnosis Date   Atherosclerosis of aorta (HCC)    Atrial fibrillation ([place])    Breast cancer ([place])    Coronary atherosclerosis due to calcified coronary lesion    Stroke ([place])    Thrombus of left atrial appendage      Family History  Problem Relation Age of Onset   Other Sister        TRAUMA TO HEAD AFTER  A FALL     Social History   Occupational History   Not on file  Tobacco Use   Smoking status: Former    Types: Cigarettes    Quit date: 1950    Years since quitting: 73.0   Smokeless tobacco: Never  Vaping Use   Vaping Use: Never used  Substance and Sexual Activity   Alcohol use: Never   Drug use: Never   Sexual activity: Not on file    Allergies  Allergen Reactions   Erythromycin     Unknown, but "all the mycins bother me"   Oxycodone Other (See Comments)    confusion   Tramadol Other (See Comments)    confusion     Outpatient Medications Prior to Visit  Medication Sig Dispense Refill   acetaminophen (TYLENOL) 500 MG tablet Take 1 tablet by mouth daily.     apixaban (ELIQUIS) 5 MG TABS tablet Take 1 tablet (5 mg total) by mouth 2 (two) times daily. 180 tablet 1   Cholecalciferol (VITAMIN D3) 1.25 MG (50000 UT) CAPS Take by mouth.     Cranberry 250 MG CAPS Take 1 capsule by mouth daily.     Cyanocobalamin (B-12) 1000 MCG TABS Take 1 tablet by mouth daily.     denosumab (PROLIA) 60 MG/ML SOSY injection Inject 60 mg into the skin See admin instructions. Every 6 months     diclofenac Sodium (VOLTAREN) 1 % GEL Apply topically 4 (four)  times daily.     diltiazem (TIAZAC) 180 MG 24 hr capsule Take 180 mg by mouth daily.     docusate sodium (COLACE) 100 MG capsule as needed.     doxycycline (VIBRA-TABS) 100 MG tablet Take 1 tablet (100 mg total) by mouth 2 (two) times daily. 14 tablet 0   fluticasone-salmeterol (ADVAIR HFA) 115-21 MCG/ACT inhaler Inhale 2 puffs into the lungs 2 (two) times daily. 1 each 12   ketoconazole (NIZORAL) 2 % shampoo See admin instructions.     Lactobacillus (ACIDOPHILUS PROBIOTIC) 100 MG CAPS Take 1 capsule by mouth daily.     memantine (NAMENDA) 10 MG tablet Take 1 tablet by mouth 2 (two) times daily.     Multiple Vitamins-Minerals (PRESERVISION AREDS 2) CAPS Take 1 capsule by mouth in the morning and at bedtime.     Omega-3 Fatty Acids (OMEGA-3  FISH OIL PO) Take 1 tablet by mouth daily at 12 noon.     No facility-administered medications prior to visit.    Review of Systems  Constitutional:  Negative for chills, diaphoresis, fever, malaise/fatigue and weight loss.  HENT:  Negative for congestion.   Respiratory:  Positive for cough and sputum production. Negative for hemoptysis, shortness of breath and wheezing.   Cardiovascular:  Negative for chest pain, palpitations and leg swelling.    Objective:   Vitals:   01/11/21 1440  BP: 118/70  Pulse: 77  SpO2: 96%  Weight: 134 lb 3.2 oz (60.9 kg)  Height: 5\' 7"  (1.702 m)    SpO2: 96 %  Physical Exam: General: Well-appearing, no acute distress HENT: [place], AT Eyes: EOMI, no scleral icterus Respiratory: Clear to auscultation bilaterally.  No crackles, wheezing or rales Cardiovascular: RRR, -M/R/G, no JVD Extremities:-Edema,-tenderness Neuro: AAO x4, CNII-XII grossly intact Psych: Normal mood, normal affect  Data Reviewed:  Imaging: CT Chest 08/17/20 - Scattered cylindrical bronchiectasis with interstitium and peribronchovascular ground glass attenuation and micro-nodularity. Complete atelectasis in the RML. LLL pleural nodules measuring 9 x 28mm and 11 x 8 mm nodules CT Chest 12/19/20 - Resolution of pleural nodules in left lower lobe. Interval development of nodular opacities in upper lobes bilaterally. Unchanged atelectasis of RML.  PFT: None on file  Labs: CBC    Component Value Date/Time   WBC 4.6 11/24/2020 2157   RBC 4.14 11/24/2020 2157   HGB 13.2 11/24/2020 2157   HGB 12.5 08/28/2020 1402   HCT 41.3 11/24/2020 2157   HCT 36.6 08/28/2020 1402   PLT 199 11/24/2020 2157   MCV 99.8 11/24/2020 2157   MCH 31.9 11/24/2020 2157   MCHC 32.0 11/24/2020 2157   RDW 13.5 11/24/2020 2157   LYMPHSABS 1.0 11/24/2020 2157   MONOABS 0.6 11/24/2020 2157   EOSABS 0.1 11/24/2020 2157   BASOSABS 0.0 11/24/2020 2157    Assessment & Plan:   Discussion: 88 year old  female former smoker with dementia, atrial fibrillation, left thrombus of atrial appendage, hx breast cancer s/p lumpectomy, osteoporosis who presents for follow-up.  Inhaler technique counseling and in-clinic demonstration. Discussed nebulizers and will consider in the future. Will need to investigate pricing before ordering nebulizer  Bronchiectasis with chronic bronchitis --CONTINUE Advair 115-21 mcg TWO puffs TWICE a day. Use with a spacer --ORDER Pulmicort and Performist. If pricing is acceptable then, will order nebulizer  Left pleural lung nodules - resolved Bilateral upper lobe opacities likely pneumonia s/p treatment No active infection at this time --No indication for further antibiotics  Health Maintenance Immunization History  Administered Date(s)  Administered   Influenza, High Dose Seasonal PF 09/06/2017   Influenza, Quadrivalent, Recombinant, Inj, Pf 09/30/2018, 10/31/2019, 09/12/2020   Moderna Sars-Covid-2 Vaccination 01/07/2019, 02/04/2019, 11/18/2019   CT Lung Screen - Not qualified. >15 years after tobacco cessation  No orders of the defined types were placed in this encounter.  Meds ordered this encounter  Medications   budesonide (PULMICORT) 0.5 MG/2ML nebulizer solution    Sig: Take 2 mLs (0.5 mg total) by nebulization daily.    Dispense:  2 mL    Refill:  12   formoterol (PERFOROMIST) 20 MCG/2ML nebulizer solution    Sig: Take 2 mLs (20 mcg total) by nebulization 2 (two) times daily.    Dispense:  2 mL    Refill:  2   Return in about 3 months (around 04/11/2021).  I have spent a total time of 40-minutes on the day of the appointment reviewing prior documentation, coordinating care and discussing medical diagnosis and plan with the patient/family. Past medical history, allergies, medications were reviewed. Pertinent imaging, labs and tests included in this note have been reviewed and interpreted independently by me.  [place], MD [facility] 01/11/2021 3:02 PM  Office Number [phone]

## 2021-01-18 ENCOUNTER — Encounter: Payer: Self-pay | Admitting: Pulmonary Disease

## 2021-01-22 ENCOUNTER — Telehealth: Payer: Self-pay | Admitting: Pulmonary Disease

## 2021-01-22 MED ORDER — BUDESONIDE 0.5 MG/2ML IN SUSP: 0.5000 mg | Freq: Every day | RESPIRATORY_TRACT | 0 refills | Status: DC

## 2021-01-22 MED ORDER — FORMOTEROL FUMARATE 20 MCG/2ML IN NEBU: 20.0000 ug | INHALATION_SOLUTION | Freq: Two times a day (BID) | RESPIRATORY_TRACT | 0 refills | Status: DC

## 2021-01-22 NOTE — Telephone Encounter (Signed)
Called and spoke with patient's daughter. She is in the process of trying to find the best price for Perforomist and Pulmicort. She would like to have the RX sent to [facility] on [address]. I advised her that I would send over a 30 day supply for them to run the claim. She verbalized understanding.   Nothing further needed at time of call.

## 2021-01-29 ENCOUNTER — Other Ambulatory Visit: Payer: Self-pay | Admitting: Cardiology

## 2021-01-29 ENCOUNTER — Ambulatory Visit (HOSPITAL_COMMUNITY)
Admission: RE | Admit: 2021-01-29 | Discharge: 2021-01-29 | Disposition: A | Discharge status: Home / Self Care | Payer: Medicare Other | Source: Ambulatory Visit | Attending: Cardiology | Admitting: Cardiology

## 2021-01-29 ENCOUNTER — Other Ambulatory Visit: Payer: Self-pay

## 2021-01-29 DIAGNOSIS — I513 Intracardiac thrombosis, not elsewhere classified: Secondary | ICD-10-CM

## 2021-01-29 DIAGNOSIS — R931 Abnormal findings on diagnostic imaging of heart and coronary circulation: Secondary | ICD-10-CM | POA: Insufficient documentation

## 2021-01-29 LAB — POCT I-STAT CREATININE: Creatinine, Ser: 0.7 mg/dL (ref 0.44–1.00)

## 2021-01-29 MED ORDER — IOHEXOL 350 MG/ML SOLN
80.0000 mL | Freq: Once | INTRAVENOUS | Status: AC | PRN
  Administered 2021-01-29: 16:00:00 80 mL via INTRAVENOUS

## 2021-02-04 ENCOUNTER — Telehealth: Payer: Self-pay

## 2021-02-04 NOTE — Telephone Encounter (Signed)
Patient called requesting results of CT please advise

## 2021-02-04 NOTE — Telephone Encounter (Signed)
Daughter called to check on CT results and also would like results sent to Dr Loanne Drilling w/ [facility].

## 2021-02-08 ENCOUNTER — Encounter (HOSPITAL_COMMUNITY): Payer: Medicare Other

## 2021-02-08 NOTE — Telephone Encounter (Signed)
Tried to call the number on file I believe that her daughter's number.  I did leave a voicemail. Please forward a copy of the results to Dr. Loanne Drilling for pulmonary medicine. If she calls [place] back please let me know and I will discuss results with her.  ST

## 2021-02-12 NOTE — Telephone Encounter (Signed)
Hi patient's daughter Kennyth Lose left a vm stating she received your vm and she is available to speak whenever you are free best number to contact her is [phone]

## 2021-02-15 ENCOUNTER — Telehealth: Payer: Self-pay | Admitting: Cardiology

## 2021-02-15 ENCOUNTER — Telehealth: Payer: Self-pay | Admitting: Pulmonary Disease

## 2021-02-15 DIAGNOSIS — J479 Bronchiectasis, uncomplicated: Secondary | ICD-10-CM

## 2021-02-15 MED ORDER — BUDESONIDE 0.5 MG/2ML IN SUSP: 0.5000 mg | Freq: Every day | RESPIRATORY_TRACT | 0 refills | Status: DC

## 2021-02-15 NOTE — Telephone Encounter (Signed)
Please schedule telephone visit in my afternoon blocked spot.

## 2021-02-15 NOTE — Telephone Encounter (Signed)
Called and spoke to pts daughter Kennyth Lose. She will be waiting for a call back from you next week.

## 2021-02-15 NOTE — Telephone Encounter (Signed)
Called and spoke with [place]. She verbalized understanding and wishes to have the message sent to Dr. Loanne Drilling. She stated that Dr. Loanne Drilling sent in abx the last time. I advised her that based on the schedule, it appears Dr. Loanne Drilling is not available today and if she does respond during the weekend, she may not get an answer until Monday. She is ok with this.   While on the phone, she also wanted to see if Dr. Loanne Drilling could look at the CT scan that the patient had back on 01/29/21. She wanted to see if Dr. Loanne Drilling could tell if the Advair was working and if the patient still needed to use the Pulmicort.   I did attempt to get her scheduled for an OV since she had multiple questions but she just wanted a call back.   Dr. Loanne Drilling, can you please advise? Thanks!

## 2021-02-15 NOTE — Telephone Encounter (Signed)
Please call Kennyth Lose back that I am out of office and will call her back next week.  But in summary the clot is still present but small in size.  Dr. Terri Skains

## 2021-02-15 NOTE — Telephone Encounter (Signed)
Patient requesting to speak with Dr. Terri Skains or Lyndon Code in regards to her CT results and other information she wanted to discuss. Please call her at [phone].

## 2021-02-15 NOTE — Telephone Encounter (Signed)
Called and spoke with patient's daughter. She stated that for the last 4 days her mother's coughing has increased and she is now coughing up thick yellowish, dark grey phlegm. She had a sore throat 3 days but after gargling with warm salt water, it went away. Denied any fever or body aches.   She is not using the Perforomist nor Pulmicort because she wanted to investigate the cost. They will not be able to afford the Perforomist but they can afford the Pulmicort. She requested to have the RX sent to [facility] on [address]. This has been sent. She also stated that they needed an order for a nebulizer machine, this has been sent as well.   Ph[facility]acy is [facility] on [address], can you please advise since JE is not available today per [place]? Thanks.

## 2021-02-18 ENCOUNTER — Other Ambulatory Visit: Payer: Self-pay

## 2021-02-18 ENCOUNTER — Other Ambulatory Visit: Payer: Self-pay | Admitting: Pulmonary Disease

## 2021-02-18 ENCOUNTER — Encounter: Payer: Self-pay | Admitting: Pulmonary Disease

## 2021-02-18 ENCOUNTER — Telehealth: Payer: Self-pay | Admitting: Pulmonary Disease

## 2021-02-18 ENCOUNTER — Encounter (INDEPENDENT_AMBULATORY_CARE_PROVIDER_SITE_OTHER): Payer: Medicare Other | Admitting: Ophthalmology

## 2021-02-18 ENCOUNTER — Telehealth (INDEPENDENT_AMBULATORY_CARE_PROVIDER_SITE_OTHER): Payer: Medicare Other | Admitting: Pulmonary Disease

## 2021-02-18 DIAGNOSIS — H353231 Exudative age-related macular degeneration, bilateral, with active choroidal neovascularization: Secondary | ICD-10-CM | POA: Diagnosis not present

## 2021-02-18 DIAGNOSIS — H43813 Vitreous degeneration, bilateral: Secondary | ICD-10-CM

## 2021-02-18 DIAGNOSIS — J471 Bronchiectasis with (acute) exacerbation: Secondary | ICD-10-CM

## 2021-02-18 DIAGNOSIS — I10 Essential (primary) hypertension: Secondary | ICD-10-CM

## 2021-02-18 DIAGNOSIS — H35033 Hypertensive retinopathy, bilateral: Secondary | ICD-10-CM

## 2021-02-18 MED ORDER — FORMOTEROL FUMARATE 20 MCG/2ML IN NEBU: 20.0000 ug | INHALATION_SOLUTION | Freq: Two times a day (BID) | RESPIRATORY_TRACT | 11 refills | Status: DC

## 2021-02-18 MED ORDER — PREDNISONE 10 MG PO TABS: 30.0000 mg | ORAL_TABLET | Freq: Every day | ORAL | 0 refills | Status: AC

## 2021-02-18 MED ORDER — DOXYCYCLINE HYCLATE 100 MG PO TABS: 100.0000 mg | ORAL_TABLET | Freq: Two times a day (BID) | ORAL | 0 refills | Status: DC

## 2021-02-18 MED ORDER — BUDESONIDE 0.25 MG/2ML IN SUSP: 0.2500 mg | Freq: Two times a day (BID) | RESPIRATORY_TRACT | 11 refills | Status: DC

## 2021-02-18 NOTE — Telephone Encounter (Signed)
Phone visit scheduled for today at 4:15. Nothing further needed.

## 2021-02-18 NOTE — Telephone Encounter (Signed)
Lm for patient.  

## 2021-02-18 NOTE — Telephone Encounter (Signed)
Bridget [name] is returning phone call. Elsie Amis has mychart video visit with Dr. Loanne Drilling 02/18/2021. Jacque phone number is [phone].

## 2021-02-18 NOTE — Progress Notes (Signed)
Virtual Visit via Video Note  I connected with Bridget [name] on 02/18/21 at  4:15 PM EST by a video enabled telemedicine application and verified that I am speaking with the correct person using two identifiers.  Location: Patient: Home Provider: [facility]   I discussed the limitations of evaluation and management by telemedicine and the availability of in person appointments. The patient expressed understanding and agreed to proceed.    I discussed the assessment and treatment plan with the patient. The patient was provided an opportunity to ask questions and all were answered. The patient agreed with the plan and demonstrated an understanding of the instructions.   The patient was advised to call back or seek an in-person evaluation if the symptoms worsen or if the condition fails to improve as anticipated.   Subjective:   PATIENT ID: Bridget [name]: female DOB: August 08, 1921, MRN: [id number]   HPI  Chief Complaint  Patient presents with   Follow-up    Coughing up yellow and dark gray stuff    Reason for Visit: Follow-up bronchiectasis  Ms. Bridget [name] is a 88 year old female former smoker with dementia, atrial fibrillation, left thrombus of atrial appendage, hx breast cancer s/p lumpectomy, osteoporosis and bronchiectasis.  Synopsis: She has previously seen a Pulmonologist 38 years ago for hemoptysis that resolved after initial presentation. Attributed to bronchiectasis and she was treated with a slant board. She has a chronic cough associated with white sputum that equates to at least 2-3 tablespoons. Denies hemoptysis. Denies wheezing or shortness of breath. She has a history of whooping cough as a child.  12/14/20 Daughter is on line with patient to provide additional history. Since our last visit she had COVID-19 in October and recovered fully to baseline symptoms after five days. She has increased cough and sputum production up to 1/3 of dixie cup in the last few  weeks. Color has changed to yellow in the last two days. Denies fevers, chills. No shortness of breath or wheezing.  01/11/21  Daughter provides history. Since our last visit after antibiotics her sputum improved but still had productive cough.  She has been compliant with Advair however seems to have a more vigorous cough afterwards. Cough in the morning with inhaler at night. Throughout the day she has no coughing.   02/18/21 She starting having worsening productive cough three days ago. Started on mucinex with some improvement. Continues to have sputum with yellow-dark grey. Heavy breathing but no wheezing. Denies fevers or chills. She is compliant with Advair.  Social History: Former smoker. Smoked 1/2 ppd x 15 years. Quit in 1950   Past Medical History:  Diagnosis Date   Atherosclerosis of aorta (HCC)    Atrial fibrillation ([place])    Breast cancer ([place])    Coronary atherosclerosis due to calcified coronary lesion    Stroke ([place])    Thrombus of left atrial appendage      Family History  Problem Relation Age of Onset   Other Sister        TRAUMA TO HEAD AFTER A FALL     Social History   Occupational History   Not on file  Tobacco Use   Smoking status: Former    Types: Cigarettes    Quit date: 1950    Years since quitting: 73.1   Smokeless tobacco: Never  Vaping Use   Vaping Use: Never used  Substance and Sexual Activity   Alcohol use: Never   Drug use: Never  Sexual activity: Not on file    Allergies  Allergen Reactions   Erythromycin     Unknown, but "all the mycins bother me"   Oxycodone Other (See Comments)    confusion   Tramadol Other (See Comments)    confusion     Outpatient Medications Prior to Visit  Medication Sig Dispense Refill   acetaminophen (TYLENOL) 500 MG tablet Take 1 tablet by mouth daily.     apixaban (ELIQUIS) 5 MG TABS tablet Take 1 tablet (5 mg total) by mouth 2 (two) times daily. 180 tablet 1   budesonide (PULMICORT) 0.5 MG/2ML  nebulizer solution Take 2 mLs (0.5 mg total) by nebulization daily. 60 mL 0   Cholecalciferol (VITAMIN D3) 1.25 MG (50000 UT) CAPS Take by mouth.     Cranberry 250 MG CAPS Take 1 capsule by mouth daily.     Cyanocobalamin (B-12) 1000 MCG TABS Take 1 tablet by mouth daily.     denosumab (PROLIA) 60 MG/ML SOSY injection Inject 60 mg into the skin See admin instructions. Every 6 months     diclofenac Sodium (VOLTAREN) 1 % GEL Apply topically 4 (four) times daily.     diltiazem (TIAZAC) 180 MG 24 hr capsule Take 180 mg by mouth daily.     docusate sodium (COLACE) 100 MG capsule as needed.     ketoconazole (NIZORAL) 2 % shampoo See admin instructions.     Lactobacillus (ACIDOPHILUS PROBIOTIC) 100 MG CAPS Take 1 capsule by mouth daily.     memantine (NAMENDA) 10 MG tablet Take 1 tablet by mouth 2 (two) times daily.     Multiple Vitamins-Minerals (PRESERVISION AREDS 2) CAPS Take 1 capsule by mouth in the morning and at bedtime.     Omega-3 Fatty Acids (OMEGA-3 FISH OIL PO) Take 1 tablet by mouth daily at 12 noon.     doxycycline (VIBRA-TABS) 100 MG tablet Take 1 tablet (100 mg total) by mouth 2 (two) times daily. 14 tablet 0   fluticasone-salmeterol (ADVAIR HFA) 115-21 MCG/ACT inhaler Inhale 2 puffs into the lungs 2 (two) times daily. 1 each 12   formoterol (PERFOROMIST) 20 MCG/2ML nebulizer solution Take 2 mLs (20 mcg total) by nebulization 2 (two) times daily. 120 mL 0   No facility-administered medications prior to visit.    Review of Systems  Constitutional:  Negative for chills, diaphoresis, fever, malaise/fatigue and weight loss.  HENT:  Negative for congestion.   Respiratory:  Positive for cough and sputum production. Negative for hemoptysis, shortness of breath and wheezing.   Cardiovascular:  Negative for chest pain, palpitations and leg swelling.    Objective:   There were no vitals filed for this visit.      Physical Exam: General: Elderly frail-appearing, no acute distress, no  acute distress HENT: [place], AT Eyes: EOMI, no scleral icterus Neuro: AAO x4, CNII-XII grossly intact Psych: Normal mood, normal affect  Data Reviewed:  Imaging: CT Chest 08/17/20 - Scattered cylindrical bronchiectasis with interstitium and peribronchovascular ground glass attenuation and micro-nodularity. Complete atelectasis in the RML. LLL pleural nodules measuring 9 x 63mm and 11 x 8 mm nodules CT Chest 12/19/20 - Resolution of pleural nodules in left lower lobe. Interval development of nodular opacities in upper lobes bilaterally. Unchanged atelectasis of RML. CT Chest 02/18/21 - Chronic RML atelectasis. Resolution of upper lobes bilaterally. Of note, improved left atrial thrombus noted.  PFT: None on file  Labs: CBC    Component Value Date/Time   WBC 4.6 11/24/2020 2157  RBC 4.14 11/24/2020 2157   HGB 13.2 11/24/2020 2157   HGB 12.5 08/28/2020 1402   HCT 41.3 11/24/2020 2157   HCT 36.6 08/28/2020 1402   PLT 199 11/24/2020 2157   MCV 99.8 11/24/2020 2157   MCH 31.9 11/24/2020 2157   MCHC 32.0 11/24/2020 2157   RDW 13.5 11/24/2020 2157   LYMPHSABS 1.0 11/24/2020 2157   MONOABS 0.6 11/24/2020 2157   EOSABS 0.1 11/24/2020 2157   BASOSABS 0.0 11/24/2020 2157    Assessment & Plan:   Discussion: 87 year old female former smoker with dementia, atrial fibrillation, left thrombus of atrial appendage, hx breast cancer s/p lumpectomy, osteoporosis who presents follow-up. Will transition to nebulizer treatment for use.  Bronchiectasis with chronic bronchitis with exacerbation --START prednisone 30 mg x 5 days --START doxycycline 100 mg TWICE a day x 7 days --CONTINUE Advair 115-21 mcg TWO puffs TWICE a day. Use with a spacer --STOP Advair once you receive the nebuzulizers --ARRANGE for nebulizer --ORDER Pulmicort nebulizer ONE treatment TWICE a day --ORDER Performist nebulizer ONE treatment TWICE a day.  Chronic RML atelectasis --Nebulizer management as above --Continue  Mucinx BID for pulmonary clearance --Unable to perform flutter valve. Consider chest PT in the future   Left pleural lung nodules - resolved Bilateral upper lobe opacities - resolved with abx tx  Health Maintenance Immunization History  Administered Date(s) Administered   Influenza, High Dose Seasonal PF 09/06/2017   Influenza, Quadrivalent, Recombinant, Inj, Pf 09/30/2018, 10/31/2019, 09/12/2020   Moderna Sars-Covid-2 Vaccination 01/07/2019, 02/04/2019, 11/18/2019   CT Lung Screen - Not qualified. >15 years after tobacco cessation  No orders of the defined types were placed in this encounter.  Meds ordered this encounter  Medications   predniSONE (DELTASONE) 10 MG tablet    Sig: Take 3 tablets (30 mg total) by mouth daily with breakfast for 5 days.    Dispense:  15 tablet    Refill:  0   doxycycline (VIBRA-TABS) 100 MG tablet    Sig: Take 1 tablet (100 mg total) by mouth 2 (two) times daily.    Dispense:  14 tablet    Refill:  0   budesonide (PULMICORT) 0.25 MG/2ML nebulizer solution    Sig: Take 2 mLs (0.25 mg total) by nebulization in the morning and at bedtime.    Dispense:  120 mL    Refill:  11   formoterol (PERFOROMIST) 20 MCG/2ML nebulizer solution    Sig: Take 2 mLs (20 mcg total) by nebulization 2 (two) times daily.    Dispense:  120 mL    Refill:  11    Dx: J47.9   No follow-ups on file. F/u April 2023  I have spent a total time of 35-minutes on the day of the appointment reviewing prior documentation, coordinating care and discussing medical diagnosis and plan with the patient/family. Past medical history, allergies, medications were reviewed. Pertinent imaging, labs and tests included in this note have been reviewed and interpreted independently by me.  [place], MD [facility] 02/18/2021 5:03 PM  Office Number [phone]

## 2021-02-18 NOTE — Patient Instructions (Addendum)
Bronchiectasis with chronic bronchitis with exacerbation --START prednisone 30 mg x 5 days --START doxycycline 100 mg TWICE a day x 7 days --CONTINUE Advair 115-21 mcg TWO puffs TWICE a day. Use with a spacer --STOP Advair once you receive the nebuzulizers --ARRANGE for nebulizer --ORDER Pulmicort nebulizer ONE treatment TWICE a day --ORDER Performist nebulizer ONE treatment TWICE a day  Scheduled for April 6th. Call sooner if needed

## 2021-02-18 NOTE — Addendum Note (Signed)
Addended by: Darliss Ridgel on: 02/18/2021 05:22 PM   Modules accepted: Orders

## 2021-02-18 NOTE — Telephone Encounter (Signed)
Please see 02/15/2021 phone note.

## 2021-02-25 ENCOUNTER — Other Ambulatory Visit (HOSPITAL_COMMUNITY): Payer: Self-pay

## 2021-02-26 NOTE — Telephone Encounter (Signed)
Spoke to her daughter last week over the phone.  The LAA blot size has improved but still present will continue [place].  Will re-evaluate it in 3 months.   Dr. Terri Skains

## 2021-02-28 ENCOUNTER — Telehealth: Payer: Self-pay | Admitting: Pulmonary Disease

## 2021-03-01 NOTE — Telephone Encounter (Signed)
I called the daughter and she wanted another shipment of her face masks  and  she was appreciative. Per Danielle with [employer] she will get the patient some masks shipped. Nothing further needed.

## 2021-03-05 ENCOUNTER — Telehealth: Payer: Self-pay | Admitting: Pulmonary Disease

## 2021-03-06 NOTE — Telephone Encounter (Signed)
Called and spoke with pt's daughter Bridget [name] who states she believes they received the wrong nebulizer kit for pt. Stated to her to call DME to let them know what they received and see what they recommend. She stated she would call them and probably also go to DME to get this taken care of. Nothing further needed. ?

## 2021-03-12 ENCOUNTER — Ambulatory Visit (INDEPENDENT_AMBULATORY_CARE_PROVIDER_SITE_OTHER): Payer: Medicare Other | Admitting: Pulmonary Disease

## 2021-03-12 ENCOUNTER — Other Ambulatory Visit: Payer: Self-pay

## 2021-03-12 ENCOUNTER — Telehealth: Payer: Self-pay | Admitting: Pulmonary Disease

## 2021-03-12 ENCOUNTER — Encounter: Payer: Self-pay | Admitting: Pulmonary Disease

## 2021-03-12 VITALS — BP 118/60 | HR 88 | Ht 67.0 in

## 2021-03-12 DIAGNOSIS — M7989 Other specified soft tissue disorders: Secondary | ICD-10-CM | POA: Diagnosis not present

## 2021-03-12 LAB — CBC WITH DIFFERENTIAL/PLATELET
Basophils Absolute: 0 10*3/uL (ref 0.0–0.1)
Basophils Relative: 0.9 % (ref 0.0–3.0)
Eosinophils Absolute: 0.1 10*3/uL (ref 0.0–0.7)
Eosinophils Relative: 1.7 % (ref 0.0–5.0)
HCT: 37.9 % (ref 36.0–46.0)
Hemoglobin: 12.6 g/dL (ref 12.0–15.0)
Lymphocytes Relative: 30.4 % (ref 12.0–46.0)
Lymphs Abs: 1.2 10*3/uL (ref 0.7–4.0)
MCHC: 33.3 g/dL (ref 30.0–36.0)
MCV: 97.3 fl (ref 78.0–100.0)
Monocytes Absolute: 0.4 10*3/uL (ref 0.1–1.0)
Monocytes Relative: 11 % (ref 3.0–12.0)
Neutro Abs: 2.3 10*3/uL (ref 1.4–7.7)
Neutrophils Relative %: 56 % (ref 43.0–77.0)
Platelets: 163 10*3/uL (ref 150.0–400.0)
RBC: 3.9 Mil/uL (ref 3.87–5.11)
RDW: 14.6 % (ref 11.5–15.5)
WBC: 4 10*3/uL (ref 4.0–10.5)

## 2021-03-12 LAB — BASIC METABOLIC PANEL
BUN: 14 mg/dL (ref 6–23)
CO2: 31 mEq/L (ref 19–32)
Calcium: 8.8 mg/dL (ref 8.4–10.5)
Chloride: 101 mEq/L (ref 96–112)
Creatinine, Ser: 0.88 mg/dL (ref 0.40–1.20)
GFR: 55.11 mL/min — ABNORMAL LOW (ref >60.00)
Glucose, Bld: 89 mg/dL (ref 70–99)
Potassium: 3.9 mEq/L (ref 3.5–5.1)
Sodium: 138 mEq/L (ref 135–145)

## 2021-03-12 MED ORDER — FUROSEMIDE 20 MG PO TABS: 20.0000 mg | ORAL_TABLET | Freq: Every day | ORAL | 0 refills | Status: DC | PRN

## 2021-03-12 NOTE — Progress Notes (Signed)
Subjective:   PATIENT ID: Bridget [name] GENDER: female DOB: 07-13-1921, MRN: [id number]   HPI  Chief Complaint  Patient presents with   Follow-up    Leg and ankle swelling     Reason for Visit: Follow-up bronchiectasis  Bridget [name] is a 88 year old female former smoker with dementia, atrial fibrillation, left thrombus of atrial appendage, hx breast cancer s/p lumpectomy, osteoporosis and bronchiectasis.  Synopsis: She has previously seen a Pulmonologist 38 years ago for hemoptysis that resolved after initial presentation. Attributed to bronchiectasis and she was treated with a slant board. She has a chronic cough associated with white sputum that equates to at least 2-3 tablespoons. Denies hemoptysis. Denies wheezing or shortness of breath. She has a history of whooping cough as a child.  12/14/20 Daughter is on line with patient to provide additional history. Since our last visit she had COVID-19 in October and recovered fully to baseline symptoms after five days. She has increased cough and sputum production up to 1/3 of dixie cup in the last few weeks. Color has changed to yellow in the last two days. Denies fevers, chills. No shortness of breath or wheezing.  01/11/21  Daughter provides history. Since our last visit after antibiotics her sputum improved but still had productive cough.  She has been compliant with Advair however seems to have a more vigorous cough afterwards. Cough in the morning with inhaler at night. Throughout the day she has no coughing.   02/18/21 She starting having worsening productive cough three days ago. Started on mucinex with some improvement. Continues to have sputum with yellow-dark grey. Heavy breathing but no wheezing. Denies fevers or chills. She is compliant with Advair.  03/12/21 Her daughter is present. Since the antibiotics and steroids she has improved.  Patient has started have lower extremity swelling after starting Pulmicort and Performist.  In the last two days her leg swelling and her eyes are more swelling. While using the nebulizers her coughing has improved. Her daughter reports her breathing in noisy at night. She reports her mother is well-hydrated.  Social History: Former smoker. Smoked 1/2 ppd x 15 years. Quit in 1950   Past Medical History:  Diagnosis Date   Atherosclerosis of aorta (HCC)    Atrial fibrillation ([place])    Breast cancer ([place])    Coronary atherosclerosis due to calcified coronary lesion    Stroke ([place])    Thrombus of left atrial appendage      Family History  Problem Relation Age of Onset   Other Sister        TRAUMA TO HEAD AFTER A FALL     Social History   Occupational History   Not on file  Tobacco Use   Smoking status: Former    Types: Cigarettes    Quit date: 1950    Years since quitting: 73.2   Smokeless tobacco: Never  Vaping Use   Vaping Use: Never used  Substance and Sexual Activity   Alcohol use: Never   Drug use: Never   Sexual activity: Not on file    Allergies  Allergen Reactions   Erythromycin Other (See Comments)    Unknown, but "all the mycins bother me"   Oxycodone Other (See Comments)    confusion   Tramadol Other (See Comments)    confusion     Outpatient Medications Prior to Visit  Medication Sig Dispense Refill   acetaminophen (TYLENOL) 500 MG tablet Take 1 tablet by mouth daily.  apixaban (ELIQUIS) 5 MG TABS tablet Take 1 tablet (5 mg total) by mouth 2 (two) times daily. 180 tablet 1   budesonide (PULMICORT) 0.25 MG/2ML nebulizer solution Take 2 mLs (0.25 mg total) by nebulization in the morning and at bedtime. 120 mL 11   budesonide (PULMICORT) 0.5 MG/2ML nebulizer solution Take 2 mLs (0.5 mg total) by nebulization daily. 60 mL 0   CALCIUM-MAG-VIT C-VIT D PO Take by mouth.     Cholecalciferol (VITAMIN D3) 1.25 MG (50000 UT) CAPS Take by mouth.     co-enzyme Q-10 30 MG capsule Take 30 mg by mouth 3 (three) times daily.     CRANBERRY-VITAMIN C-D  MANNOSE PO Take 1,000 mg by mouth daily.     Cyanocobalamin (B-12) 1000 MCG TABS Take 1 tablet by mouth daily.     denosumab (PROLIA) 60 MG/ML SOSY injection Inject 60 mg into the skin See admin instructions. Every 6 months     diclofenac Sodium (VOLTAREN) 1 % GEL Apply topically 4 (four) times daily.     diltiazem (TIAZAC) 180 MG 24 hr capsule Take 180 mg by mouth daily.     docusate sodium (COLACE) 100 MG capsule as needed.     doxycycline (VIBRA-TABS) 100 MG tablet Take 1 tablet (100 mg total) by mouth 2 (two) times daily. 14 tablet 0   formoterol (PERFOROMIST) 20 MCG/2ML nebulizer solution Take 2 mLs (20 mcg total) by nebulization 2 (two) times daily. 120 mL 11   ketoconazole (NIZORAL) 2 % shampoo See admin instructions.     Lactobacillus (ACIDOPHILUS PROBIOTIC) 100 MG CAPS Take 1 capsule by mouth daily.     memantine (NAMENDA) 10 MG tablet Take 1 tablet by mouth 2 (two) times daily.     Multiple Vitamins-Minerals (PRESERVISION AREDS 2) CAPS Take 1 capsule by mouth in the morning and at bedtime.     NON FORMULARY melatonin cbd     Omega-3 Fatty Acids (OMEGA-3 FISH OIL PO) Take 1 tablet by mouth daily at 12 noon.     Resveratrol 250 MG CAPS Take by mouth.     Cranberry 250 MG CAPS Take 1 capsule by mouth daily.     No facility-administered medications prior to visit.    Review of Systems  Constitutional:  Negative for chills, diaphoresis, fever, malaise/fatigue and weight loss.  HENT:  Negative for congestion.   Respiratory:  Negative for cough, hemoptysis, sputum production, shortness of breath and wheezing.   Cardiovascular:  Positive for leg swelling. Negative for chest pain and palpitations.    Objective:   Vitals:   03/12/21 1523  BP: 118/60  Pulse: 88  SpO2: 96%  Height: '5\' 7"'$  (1.702 m)     SpO2: 96 % O2 Device: None (Room air)  Physical Exam: General: Elderly, frail-appearing, no acute distress HENT: [place], AT Eyes: EOMI, no scleral icterus Respiratory: Diminished  but clear to auscultation bilaterally.  No crackles, wheezing or rales Cardiovascular: RRR, -M/R/G, no JVD Extremities:-Edema,-tenderness Neuro: AAO x4, CNII-XII grossly intact Psych: Normal mood, normal affect  Data Reviewed:  Imaging: CT Chest 08/17/20 - Scattered cylindrical bronchiectasis with interstitium and peribronchovascular ground glass attenuation and micro-nodularity. Complete atelectasis in the RML. LLL pleural nodules measuring 9 x 19m and 11 x 8 mm nodules CT Chest 12/19/20 - Resolution of pleural nodules in left lower lobe. Interval development of nodular opacities in upper lobes bilaterally. Unchanged atelectasis of RML. CT Chest 02/18/21 - Chronic RML atelectasis. Resolution of upper lobes bilaterally. Of note, improved left atrial  thrombus noted.  PFT: None on file  Echo: 09/2020 - EF 50-55%. TV systolic function is reduced. Severely biatrial dilation. Moderate MV, TR  Labs: CBC    Component Value Date/Time   WBC 4.6 11/24/2020 2157   RBC 4.14 11/24/2020 2157   HGB 13.2 11/24/2020 2157   HGB 12.5 08/28/2020 1402   HCT 41.3 11/24/2020 2157   HCT 36.6 08/28/2020 1402   PLT 199 11/24/2020 2157   MCV 99.8 11/24/2020 2157   MCH 31.9 11/24/2020 2157   MCHC 32.0 11/24/2020 2157   RDW 13.5 11/24/2020 2157   LYMPHSABS 1.0 11/24/2020 2157   MONOABS 0.6 11/24/2020 2157   EOSABS 0.1 11/24/2020 2157   BASOSABS 0.0 11/24/2020 2157    Assessment & Plan:   Discussion: 88 year old female former smoker with dementia, atrial fibrillation, left thrombus of atrial appendage, hx breast cancer s/p lumpectomy, osteoporosis who presents for follow-up. Worsening leg swelling that began the same time as the nebulizers. Doubt the cause is related to the nebulizers but will hold due to family concern.  Lower extremity swelling --ORDER BMET and CBC --Pending results, we may start lasix for treatment. Please await my phone call ADDENDUM: Called patient's daughter. Normal Cr. Will  diurese with lasix 20 mg x 3 days. If not improved, please contact our office  Bronchiectasis with chronic bronchitis  - well controlled on nebulizer treatment however will hold due to edema --RESTART Advair 115-21 mcg TWO puffs TWICE a day. Use with a spacer --HOLD Pulmicort nebulizer ONE treatment TWICE a day --HOLD Performist nebulizer ONE treatment TWICE a day.  Chronic RML atelectasis --Nebulizer management as above --Continue Mucinx BID for pulmonary clearance --Unable to perform flutter valve. Consider chest PT in the future   Left pleural lung nodules - resolved Bilateral upper lobe opacities - resolved with abx tx  Health Maintenance Immunization History  Administered Date(s) Administered   Influenza, High Dose Seasonal PF 09/06/2017   Influenza, Quadrivalent, Recombinant, Inj, Pf 09/30/2018, 10/31/2019, 09/12/2020   Moderna Sars-Covid-2 Vaccination 01/07/2019, 02/04/2019, 11/18/2019   CT Lung Screen - Not qualified. >15 years after tobacco cessation  Orders Placed This Encounter  Procedures   Basic Metabolic Panel (BMET)    Standing Status:   Future    Number of Occurrences:   1    Standing Expiration Date:   03/12/2022   CBC w/Diff    Standing Status:   Future    Number of Occurrences:   1    Standing Expiration Date:   03/12/2022    Meds ordered this encounter  Medications   furosemide (LASIX) 20 MG tablet    Sig: Take 1 tablet (20 mg total) by mouth daily as needed for edema.    Dispense:  20 tablet    Refill:  0   No follow-ups on file. April 2023  I have spent a total time of 36-minutes on the day of the appointment reviewing prior documentation, coordinating care and discussing medical diagnosis and plan with the patient/family. Past medical history, allergies, medications were reviewed. Pertinent imaging, labs and tests included in this note have been reviewed and interpreted independently by me.  [place], MD [facility] 03/12/2021 3:45 PM  Office Number [phone]

## 2021-03-12 NOTE — Telephone Encounter (Signed)
Spoke with the pt's daughter, Kennyth Lose, [place] per [place].  ?She states that pt has been having swelling in her ankles, knees and underneath her eyes past 2-3 days  ?She states this is a new problem for her  ?She is not on the pred anymore, taking her perforomist and budesonide nebs bid  ?She wonders if nebs causing side effect of swelling  ?Pt denies any increased SOB or new respiratory co's  ?No other new meds started since her last visit ?Dr Loanne Drilling, please advise, thanks! ?

## 2021-03-12 NOTE — Telephone Encounter (Signed)
Schedule for patient to be seen today ?

## 2021-03-12 NOTE — Patient Instructions (Addendum)
Lower extremity swelling ?--ORDER BMET and CBC ?--Pending results, we may start lasix for treatment. Please await my phone call ? ?Bronchiectasis with chronic bronchitis ?--RESTART Advair 115-21 mcg TWO puffs TWICE a day. Use with a spacer ?--HOLD Pulmicort nebulizer ONE treatment TWICE a day ?--HOLD Performist nebulizer ONE treatment TWICE a day. ? ? ?

## 2021-03-18 ENCOUNTER — Other Ambulatory Visit (HOSPITAL_COMMUNITY): Payer: Self-pay | Admitting: *Deleted

## 2021-03-18 DIAGNOSIS — G609 Hereditary and idiopathic neuropathy, unspecified: Secondary | ICD-10-CM | POA: Insufficient documentation

## 2021-03-18 DIAGNOSIS — R200 Anesthesia of skin: Secondary | ICD-10-CM | POA: Insufficient documentation

## 2021-03-19 ENCOUNTER — Other Ambulatory Visit: Payer: Self-pay

## 2021-03-19 ENCOUNTER — Ambulatory Visit (HOSPITAL_COMMUNITY)
Admission: RE | Admit: 2021-03-19 | Discharge: 2021-03-19 | Disposition: A | Discharge status: Home / Self Care | Payer: Medicare Other | Source: Ambulatory Visit | Attending: Internal Medicine | Admitting: Internal Medicine

## 2021-03-19 DIAGNOSIS — M810 Age-related osteoporosis without current pathological fracture: Secondary | ICD-10-CM | POA: Diagnosis not present

## 2021-03-19 MED ORDER — DENOSUMAB 60 MG/ML ~~LOC~~ SOSY: 60.0000 mg | PREFILLED_SYRINGE | Freq: Once | SUBCUTANEOUS | Status: DC

## 2021-03-19 MED ORDER — DENOSUMAB 60 MG/ML ~~LOC~~ SOSY
PREFILLED_SYRINGE | SUBCUTANEOUS | Status: AC
  Administered 2021-03-19: 60 mg
  Filled 2021-03-19: qty 1

## 2021-03-20 ENCOUNTER — Encounter (HOSPITAL_COMMUNITY): Payer: Medicare Other

## 2021-03-21 ENCOUNTER — Other Ambulatory Visit: Payer: Self-pay | Admitting: Cardiology

## 2021-03-21 DIAGNOSIS — I4821 Permanent atrial fibrillation: Secondary | ICD-10-CM

## 2021-03-21 DIAGNOSIS — I513 Intracardiac thrombosis, not elsewhere classified: Secondary | ICD-10-CM

## 2021-03-21 DIAGNOSIS — Z7901 Long term (current) use of anticoagulants: Secondary | ICD-10-CM

## 2021-03-25 ENCOUNTER — Other Ambulatory Visit: Payer: Self-pay

## 2021-03-25 ENCOUNTER — Ambulatory Visit: Payer: Medicare Other | Admitting: Cardiology

## 2021-03-25 ENCOUNTER — Encounter: Payer: Self-pay | Admitting: Cardiology

## 2021-03-25 VITALS — BP 146/65 | HR 62 | Temp 98.0°F | Resp 16 | Ht 67.0 in | Wt 137.6 lb

## 2021-03-25 DIAGNOSIS — M7989 Other specified soft tissue disorders: Secondary | ICD-10-CM

## 2021-03-25 DIAGNOSIS — Z7901 Long term (current) use of anticoagulants: Secondary | ICD-10-CM

## 2021-03-25 DIAGNOSIS — Z8673 Personal history of transient ischemic attack (TIA), and cerebral infarction without residual deficits: Secondary | ICD-10-CM

## 2021-03-25 DIAGNOSIS — I513 Intracardiac thrombosis, not elsewhere classified: Secondary | ICD-10-CM

## 2021-03-25 DIAGNOSIS — I2510 Atherosclerotic heart disease of native coronary artery without angina pectoris: Secondary | ICD-10-CM

## 2021-03-25 DIAGNOSIS — I700 Atherosclerosis of aorta: Secondary | ICD-10-CM

## 2021-03-25 DIAGNOSIS — Z87891 Personal history of nicotine dependence: Secondary | ICD-10-CM

## 2021-03-25 DIAGNOSIS — I10 Essential (primary) hypertension: Secondary | ICD-10-CM

## 2021-03-25 DIAGNOSIS — R0602 Shortness of breath: Secondary | ICD-10-CM

## 2021-03-25 DIAGNOSIS — I4821 Permanent atrial fibrillation: Secondary | ICD-10-CM

## 2021-03-25 MED ORDER — DILTIAZEM HCL ER BEADS 240 MG PO CP24: 240.0000 mg | ORAL_CAPSULE | Freq: Every day | ORAL | 0 refills | Status: DC

## 2021-03-25 NOTE — Progress Notes (Signed)
? ?Date:  03/25/2021  ? ?ID:  Bridget [name], DOB May 12, 1921, MRN [id number] ? ?PCP:  Bridget Pao, MD  ?Cardiologist:  Rex Kras, DO, [facility]  (established care 08/28/2020) ? ?Date: 03/25/21 ?Last Office Visit: 12/25/2020 ? ?Chief Complaint  ?Patient presents with  ? Edema  ? Atrial Fibrillation  ? Follow-up  ? ? ?HPI  ?Bridget [name] is a 88 y.o. female whose past medical history and cardiovascular risk factors include: Atrial fibrillation (permanent, per daughter), hx of stroke (per daughter in 2017), history of breast cancer, osteoporosis bilateral hip fractures, dementia (per daughter), thrombus of the left atrial appendage (CT scan ), pulmonary nodule, advanced age, post menopausal female.  ? ?She is referred to the office at the request of Tisovec, Fransico Him, MD for evaluation of atrial fibrillation. ? ?Patient is accompanied by her daughter Kennyth Lose at today's visit who provides collateral history as part of today's office visit. ? ?Initially referred to the practice for management of atrial fibrillation and a CT scan which noted an incidental finding of left atrial appendage thrombus.  She was recommended to undergo TEE for confirmation of left atrial appendage thrombus; however, given her frailty and age family chose not to proceed with TEE.  Her Pradaxa was transitioned to Eliquis and she underwent a repeat CT study in January 2023 which reported improvement in the left atrial appendage thrombus size but still present.  Recommend repeat study in 3 months for reevaluation. ? ?Patient presents today for sooner appointment given her symptoms of shortness of breath, lower extremity swelling, and elevated blood pressures.  Patient's daughter states that "at rest she can see her mom breathe with a rise and fall chest cavity."  In addition, her blood pressures at home recently has been around 130-140 mmHg when compared to her baseline of 120/80.  She recently was started on inhaler by pulmonary medicine and  subsequently started experiencing lower extremity swelling.  She had repeat labs and was given a 3-day trial of Lasix which helped her lower extremity swelling. ? ?Clinically patient denies any orthopnea, paroxysmal nocturnal dyspnea or lower extremity swelling.  Her blood pressures are elevated but within acceptable range.  She does not carry a diagnosis of hypertension. ? ?FUNCTIONAL STATUS: ?Works with physical therapy for 45 minutes 3 times a week and on colder days and goes out for a walk with her daughter Kennyth Lose. ? ?ALLERGIES: ?Allergies  ?Allergen Reactions  ? Erythromycin Other (See Comments)  ?  Unknown, but "all the mycins bother me"  ? Oxycodone Other (See Comments)  ?  confusion  ? Tramadol Other (See Comments)  ?  confusion  ? ? ?MEDICATION LIST PRIOR TO VISIT: ?Current Meds  ?Medication Sig  ? acetaminophen (TYLENOL) 500 MG tablet Take 1 tablet by mouth daily.  ? apixaban (ELIQUIS) 5 MG TABS tablet Take 1 tablet (5 mg total) by mouth 2 (two) times daily.  ? budesonide (PULMICORT) 0.25 MG/2ML nebulizer solution Take 2 mLs (0.25 mg total) by nebulization in the morning and at bedtime.  ? budesonide (PULMICORT) 0.5 MG/2ML nebulizer solution Take 2 mLs (0.5 mg total) by nebulization daily.  ? CALCIUM-MAG-VIT C-VIT D PO Take by mouth.  ? Cholecalciferol (VITAMIN D3) 1.25 MG (50000 UT) CAPS Take by mouth.  ? co-enzyme Q-10 30 MG capsule Take 30 mg by mouth 3 (three) times daily.  ? Cyanocobalamin (B-12) 1000 MCG TABS Take 1 tablet by mouth daily.  ? denosumab (PROLIA) 60 MG/ML SOSY injection Inject 60 mg into  the skin See admin instructions. Every 6 months  ? diclofenac Sodium (VOLTAREN) 1 % GEL Apply topically 4 (four) times daily.  ? diltiazem (TIAZAC) 240 MG 24 hr capsule Take 1 capsule (240 mg total) by mouth daily.  ? docusate sodium (COLACE) 100 MG capsule 100 mg daily as needed.  ? formoterol (PERFOROMIST) 20 MCG/2ML nebulizer solution Take 2 mLs (20 mcg total) by nebulization 2 (two) times daily.  ?  ketoconazole (NIZORAL) 2 % shampoo See admin instructions.  ? Lactobacillus (ACIDOPHILUS PROBIOTIC) 100 MG CAPS Take 1 capsule by mouth daily.  ? memantine (NAMENDA) 10 MG tablet Take 1 tablet by mouth 2 (two) times daily.  ? Multiple Vitamins-Minerals (PRESERVISION AREDS 2) CAPS Take 1 capsule by mouth in the morning and at bedtime.  ? NON FORMULARY melatonin cbd  ? Omega-3 Fatty Acids (OMEGA-3 FISH OIL PO) Take 1 tablet by mouth daily at 12 noon.  ? Resveratrol 250 MG CAPS Take by mouth.  ? [DISCONTINUED] diltiazem (TIAZAC) 180 MG 24 hr capsule Take 180 mg by mouth daily.  ? [DISCONTINUED] furosemide (LASIX) 20 MG tablet Take 1 tablet (20 mg total) by mouth daily as needed for edema.  ?  ? ?PAST MEDICAL HISTORY: ?Past Medical History:  ?Diagnosis Date  ? Atherosclerosis of aorta ([place])   ? Atrial fibrillation ([place])   ? Breast cancer ([place])   ? Coronary atherosclerosis due to calcified coronary lesion   ? Stroke [facility])   ? Thrombus of left atrial appendage   ? ? ?PAST SURGICAL HISTORY: ?Past Surgical History:  ?Procedure Laterality Date  ? BREAST LUMPECTOMY Left 2001  ? FEMUR FRACTURE SURGERY Right 2019  ? RIGHT - 2015  ? TOTAL ABDOMINAL HYSTERECTOMY  1967  ? ? ?FAMILY HISTORY: ?The patient family history includes Other in her sister. ? ?SOCIAL HISTORY:  ?The patient  reports that she quit smoking about 73 years ago. Her smoking use included cigarettes. She has never used smokeless tobacco. She reports that she does not drink alcohol and does not use drugs. ? ?REVIEW OF SYSTEMS: ?Review of Systems  ?Cardiovascular:  Negative for chest pain, claudication, dyspnea on exertion, leg swelling, orthopnea, palpitations, paroxysmal nocturnal dyspnea and syncope.  ?Respiratory:  Positive for shortness of breath.   ?Musculoskeletal:  Positive for joint pain.  ? ?PHYSICAL EXAM: ?Vitals with BMI 03/25/2021 03/12/2021 01/11/2021  ?Height '5\' 7"'$  '5\' 7"'$  '5\' 7"'$   ?Weight 137 lbs 10 oz (No Data) 134 lbs 3 oz  ?BMI 21.55 - 21.01  ?Systolic 001  [id number]  ?Diastolic 65 60 70  ?Pulse 62 88 77  ? ? ?CONSTITUTIONAL: Age-appropriate female, hemodynamically stable, well-developed and well-nourished. No acute distress.  ?SKIN: Skin is warm and dry. No rash noted. No cyanosis. No pallor. No jaundice ?HEAD: Normocephalic and atraumatic.  ?EYES: No scleral icterus ?MOUTH/THROAT: Moist oral membranes.  ?NECK: No JVD present. No thyromegaly noted. No carotid bruits  ?LYMPHATIC: No visible cervical adenopathy.  ?CHEST Normal respiratory effort. No intercostal retractions  ?LUNGS: Clear to auscultation bilaterally.  No stridor. No wheezes. No rales.  ?CARDIOVASCULAR: Irregularly irregular, soft holosystolic murmur heard at the apex, variable S1-S2, no gallops or rubs. ?ABDOMINAL: Soft, nontender, nondistended, positive bowel sounds in all 4 quadrants, no apparent ascites.  ?EXTREMITIES: No peripheral edema, 2+ DP and PT pulses. ?HEMATOLOGIC: No significant bruising ?NEUROLOGIC: Oriented to person, place, and time. Nonfocal. Normal muscle tone.  ?PSYCHIATRIC: Normal mood and affect. Normal behavior. Cooperative ? ?RADIOLOGY: ?CT chest with contrast: ?08/17/2020 ?IMPRESSION: ?1. Scattered  areas of bronchiectasis are noted in the lungs bilaterally with associated chronic scarring/atelectasis in the ?right middle lobe. Notably, there also 2 large pulmonary nodules in the posterior aspect of the left lower lobe. These are favored to be of infectious or inflammatory etiology, but underlying neoplasm is difficult to entirely exclude. Accordingly, short-term repeat chest CT is recommended 1-2 months to ensure the stability or regression of these findings. ?2. Cardiomegaly with biatrial dilatation. Notably, there is a large filling defect in the tip of the left atrial appendage highly concerning for left atrial appendage thrombus. If present, this places the patient at high risk for systemic embolization. Further evaluation with transesophageal echocardiography is recommended if  clinically appropriate. ?3. Trace left pleural effusion lying dependently. ?4. Aortic atherosclerosis, in addition to left main and 3 vessel coronary artery disease. ?5. There are calcifications of the aortic

## 2021-03-26 ENCOUNTER — Ambulatory Visit (INDEPENDENT_AMBULATORY_CARE_PROVIDER_SITE_OTHER): Payer: Medicare Other | Admitting: Podiatry

## 2021-03-26 ENCOUNTER — Encounter: Payer: Self-pay | Admitting: Podiatry

## 2021-03-26 DIAGNOSIS — M79675 Pain in left toe(s): Secondary | ICD-10-CM | POA: Diagnosis not present

## 2021-03-26 DIAGNOSIS — G629 Polyneuropathy, unspecified: Secondary | ICD-10-CM

## 2021-03-26 DIAGNOSIS — M79674 Pain in right toe(s): Secondary | ICD-10-CM | POA: Diagnosis not present

## 2021-03-26 DIAGNOSIS — B351 Tinea unguium: Secondary | ICD-10-CM | POA: Diagnosis not present

## 2021-03-26 LAB — BASIC METABOLIC PANEL
BUN/Creatinine Ratio: 18 (ref 12–28)
BUN: 13 mg/dL (ref 10–36)
CO2: 26 mmol/L (ref 20–29)
Calcium: 9.1 mg/dL (ref 8.7–10.3)
Chloride: 102 mmol/L (ref 96–106)
Creatinine, Ser: 0.73 mg/dL (ref 0.57–1.00)
Glucose: 69 mg/dL — ABNORMAL LOW (ref 70–99)
Potassium: 4.8 mmol/L (ref 3.5–5.2)
Sodium: 141 mmol/L (ref 134–144)
eGFR: 75 mL/min/{1.73_m2} (ref >59)

## 2021-03-26 LAB — HEMOGLOBIN AND HEMATOCRIT, BLOOD
Hematocrit: 40.6 % (ref 34.0–46.6)
Hemoglobin: 13.6 g/dL (ref 11.1–15.9)

## 2021-03-26 LAB — MAGNESIUM: Magnesium: 2.4 mg/dL — ABNORMAL HIGH (ref 1.6–2.3)

## 2021-03-27 ENCOUNTER — Other Ambulatory Visit: Payer: Self-pay

## 2021-03-27 ENCOUNTER — Telehealth: Payer: Self-pay | Admitting: Cardiology

## 2021-03-27 DIAGNOSIS — I4821 Permanent atrial fibrillation: Secondary | ICD-10-CM

## 2021-03-27 MED ORDER — DILTIAZEM HCL ER BEADS 240 MG PO CP24: 240.0000 mg | ORAL_CAPSULE | Freq: Every day | ORAL | 2 refills | Status: DC

## 2021-03-27 NOTE — Telephone Encounter (Signed)
done

## 2021-03-27 NOTE — Telephone Encounter (Signed)
Pt has questions regarding a medication that was at the pharmacy for her to pick up. ?

## 2021-03-28 DIAGNOSIS — M25561 Pain in right knee: Secondary | ICD-10-CM | POA: Insufficient documentation

## 2021-03-29 NOTE — Progress Notes (Signed)
Called and spoke to patient she wanted to know regarding patient's glucose and magnesium level that it showed it was elevated and also why would the patient gain any weight what will that mean or be coming from please advise  ? ?

## 2021-04-01 NOTE — Progress Notes (Signed)
?  Subjective:  ?Patient ID: Bridget [name], female    DOB: 05/24/1921,  MRN: [id number] ? ?DONIQUA SAXBY presents to clinic today for painful thick toenails that are difficult to trim. Pain interferes with ambulation. Aggravating factors include wearing enclosed shoe gear. Pain is relieved with periodic professional debridement. ? ?She is accompanied by her daughter, Haroldine Laws, on today's visit. ? ?Daughter is requesting order for physical therapy for anondyne therapy for Mom's painful neuropathy. She is under the care of [facility] ? ?PCP is Tisovec, Fransico Him, MD , and last visit was February 26, 2021. ? ?Allergies  ?Allergen Reactions  ? Erythromycin Other (See Comments)  ?  Unknown, but "all the mycins bother me"  ? Oxycodone Other (See Comments)  ?  confusion  ? Tramadol Other (See Comments)  ?  confusion  ? ? ?Review of Systems: Negative except as noted in the HPI. ? ?Objective: No changes noted in today's physical examination. ? ? ?Objective:  ?There were no vitals filed for this visit. ?Constitutional Patient is a pleasant 88 y.o. Caucasian female frail, in NAD. AAO x 3.  ?Vascular CFT <3 seconds b/l LE. Palpable DP pulse(s) b/l LE. Palpable PT pulse(s) b/l LE. Pedal hair absent. No pain with calf compression b/l. Lower extremity skin temperature gradient within normal limits. No edema noted b/l LE. No cyanosis or clubbing noted b/l LE.   ?Neurologic Normal speech. Pt has subjective symptoms of neuropathy. Protective sensation intact 5/5 intact bilaterally with 10g monofilament b/l. Vibratory sensation intact b/l.  ?Dermatologic Pedal skin thin and atrophic b/l LE. No open wounds b/l LE. No interdigital macerations noted b/l LE. Toenails 1-5 b/l elongated, discolored, dystrophic, thickened, crumbly with subungual debris and tenderness to dorsal palpation. No hyperkeratotic nor porokeratotic lesions present on today's visit.  ?Orthopedic: Muscle strength 5/5 to all lower extremity muscle  groups bilaterally. Hammertoe deformity noted 2-5 b/l.  ? ?Assessment/Plan: ?1. Pain due to onychomycosis of toenails of both feet   ?2. Polyneuropathy   ?  ?-Patient was evaluated and treated. All patient's and/or POA's questions/concerns answered on today's visit. ?-Daughter would like Mom to undergo anondyne therapy. She is established with physical therapy in [place]. Daughter will have practice fax order form for completion. . ?-Toenails 1-5 bilaterally were debrided in length and girth with sterile nail nippers and dremel. Pinpoint bleeding of L hallux addressed with Lumicain Hemostatic Solution, cleansed with alcohol. triple antibiotic ointment applied. Patient instructed to apply triple antibiotic ointment once daily for 7 days. ?-Patient/POA to call should there be question/concern in the interim.  ? ?Return in about 3 months (around 06/26/2021). ? ?Marzetta Board, DPM  ?

## 2021-04-08 ENCOUNTER — Encounter (INDEPENDENT_AMBULATORY_CARE_PROVIDER_SITE_OTHER): Payer: Medicare Other | Admitting: Ophthalmology

## 2021-04-08 DIAGNOSIS — I10 Essential (primary) hypertension: Secondary | ICD-10-CM | POA: Diagnosis not present

## 2021-04-08 DIAGNOSIS — H353231 Exudative age-related macular degeneration, bilateral, with active choroidal neovascularization: Secondary | ICD-10-CM

## 2021-04-08 DIAGNOSIS — H35033 Hypertensive retinopathy, bilateral: Secondary | ICD-10-CM

## 2021-04-08 DIAGNOSIS — H43813 Vitreous degeneration, bilateral: Secondary | ICD-10-CM | POA: Diagnosis not present

## 2021-04-11 ENCOUNTER — Ambulatory Visit (INDEPENDENT_AMBULATORY_CARE_PROVIDER_SITE_OTHER): Payer: Medicare Other | Admitting: Pulmonary Disease

## 2021-04-11 ENCOUNTER — Encounter: Payer: Self-pay | Admitting: Pulmonary Disease

## 2021-04-11 VITALS — BP 112/48 | HR 95 | Ht 67.0 in | Wt 133.0 lb

## 2021-04-11 DIAGNOSIS — J9811 Atelectasis: Secondary | ICD-10-CM

## 2021-04-11 DIAGNOSIS — J479 Bronchiectasis, uncomplicated: Secondary | ICD-10-CM | POA: Diagnosis not present

## 2021-04-11 NOTE — Patient Instructions (Addendum)
Bronchiectasis with chronic bronchitis   ?--CONTINUE Advair 115-21 mcg TWO puffs TWICE a day. Use with a spacer ?--START flutter valve 5-10 blows twice a day as needed ?--HOLD Pulmicort nebulizer ONE treatment TWICE a day ?--HOLD Performist nebulizer ONE treatment TWICE a day. ?  ?Chronic RML atelectasis ?--Nebulizer management as above ?--Continue Mucinx BID for pulmonary clearance ?--PROVIDE Flutter valve. Consider chest PT if unable to use flutter valve in the future  ? ?Follow-up with me in 6 months ?

## 2021-04-11 NOTE — Progress Notes (Signed)
? ? ?Subjective:  ? ?PATIENT ID: Bridget [name] GENDER: female DOB: 1921/09/13, MRN: [id number] ? ? ?HPI ? ?Chief Complaint  ?Patient presents with  ? Follow-up  ?  Swelling is gone  ? ? ?Reason for Visit: Follow-up bronchiectasis ? ?Ms. Bridget [name] is a 88 year old female former smoker with dementia, atrial fibrillation, left thrombus of atrial appendage, hx breast cancer s/p lumpectomy, osteoporosis and bronchiectasis. ? ?Synopsis: ?She has previously seen a Pulmonologist 38 years ago for hemoptysis that resolved after initial presentation. Attributed to bronchiectasis and she was treated with a slant board. She has a chronic cough associated with white sputum that equates to at least 2-3 tablespoons. Denies hemoptysis. Denies wheezing or shortness of breath. She has a history of whooping cough as a child. ? ?12/14/20 ?Daughter is on line with patient to provide additional history. Since our last visit she had COVID-19 in October and recovered fully to baseline symptoms after five days. She has increased cough and sputum production up to 1/3 of dixie cup in the last few weeks. Color has changed to yellow in the last two days. Denies fevers, chills. No shortness of breath or wheezing. ? ?01/11/21  ?Daughter provides history. ?Since our last visit after antibiotics her sputum improved but still had productive cough.  ?She has been compliant with Advair however seems to have a more vigorous cough afterwards. Cough in the morning with inhaler at night. Throughout the day she has no coughing. ?  ?02/18/21 ?She starting having worsening productive cough three days ago. Started on mucinex with some improvement. Continues to have sputum with yellow-dark grey. Heavy breathing but no wheezing. Denies fevers or chills. She is compliant with Advair. ? ?03/12/21 ?Her daughter is present. Since the antibiotics and steroids she has improved.  Patient has started have lower extremity swelling after starting Pulmicort and Performist. In the  last two days her leg swelling and her eyes are more swelling. While using the nebulizers her coughing has improved. Her daughter reports her breathing in noisy at night. She reports her mother is well-hydrated. ? ?04/11/21 ?Since our last visit she was discontinued on nebulizers and started on Advair. Also prescribed lasix for diuresis which has improved her LE edema. Coughing with inhaler use but no coughing in between the time. Denies shortness of breath, cough or wheezing. ? ?Social History: ?Former smoker. Smoked 1/2 ppd x 15 years. Quit in 1950  ? ?Past Medical History:  ?Diagnosis Date  ? Atherosclerosis of aorta ([place])   ? Atrial fibrillation ([place])   ? Breast cancer ([place])   ? Coronary atherosclerosis due to calcified coronary lesion   ? Stroke [facility])   ? Thrombus of left atrial appendage   ?  ? ?Family History  ?Problem Relation Age of Onset  ? Other Sister   ?     TRAUMA TO HEAD AFTER A FALL  ?  ? ?Social History  ? ?Occupational History  ? Not on file  ?Tobacco Use  ? Smoking status: Former  ?  Types: Cigarettes  ?  Quit date: 23  ?  Years since quitting: 73.3  ? Smokeless tobacco: Never  ?Vaping Use  ? Vaping Use: Never used  ?Substance and Sexual Activity  ? Alcohol use: Never  ? Drug use: Never  ? Sexual activity: Not on file  ? ? ?Allergies  ?Allergen Reactions  ? Erythromycin Other (See Comments)  ?  Unknown, but "all the mycins bother me"  ? Oxycodone Other (See Comments)  ?  confusion  ? Tramadol Other (See Comments)  ?  confusion  ?  ? ?Outpatient Medications Prior to Visit  ?Medication Sig Dispense Refill  ? acetaminophen (TYLENOL) 500 MG tablet Take 1 tablet by mouth daily.    ? ADVAIR HFA 115-21 MCG/ACT inhaler Inhale 2 puffs into the lungs 2 (two) times daily.    ? AZO D-MANNOSE 500 MG CAPS     ? CALCIUM-MAG-VIT C-VIT D PO Take by mouth.    ? Cholecalciferol (VITAMIN D3) 1.25 MG (50000 UT) CAPS Take by mouth.    ? co-enzyme Q-10 30 MG capsule Take 30 mg by mouth 3 (three) times daily.    ?  Cyanocobalamin (B-12) 1000 MCG TABS Take 1 tablet by mouth daily.    ? D-5000 125 MCG (5000 UT) TABS     ? denosumab (PROLIA) 60 MG/ML SOSY injection Inject 60 mg into the skin See admin instructions. Every 6 months    ? diclofenac Sodium (VOLTAREN) 1 % GEL Apply topically 4 (four) times daily.    ? diltiazem (TIAZAC) 240 MG 24 hr capsule Take 1 capsule (240 mg total) by mouth daily. (Patient taking differently: Take 240 mg by mouth daily. Taking 160 due to insurance) 30 capsule 2  ? docusate sodium (COLACE) 100 MG capsule 100 mg daily as needed.    ? ketoconazole (NIZORAL) 2 % shampoo See admin instructions.    ? Lactobacillus (ACIDOPHILUS PROBIOTIC) 100 MG CAPS Take 1 capsule by mouth daily.    ? memantine (NAMENDA) 10 MG tablet Take 1 tablet by mouth 2 (two) times daily.    ? memantine (NAMENDA) 5 MG tablet SMARTSIG:1 By Mouth    ? Multiple Vitamins-Minerals (PRESERVISION AREDS 2) CAPS Take 1 capsule by mouth in the morning and at bedtime.    ? NON FORMULARY melatonin cbd    ? Omega-3 Fatty Acids (OMEGA-3 FISH OIL PO) Take 1 tablet by mouth daily at 12 noon.    ? Resveratrol 250 MG CAPS Take by mouth.    ? senna (SENOKOT) 8.6 MG TABS tablet SMARTSIG:1 By Mouth    ? apixaban (ELIQUIS) 5 MG TABS tablet Take 1 tablet (5 mg total) by mouth 2 (two) times daily. 180 tablet 1  ? budesonide (PULMICORT) 0.25 MG/2ML nebulizer solution Take 2 mLs (0.25 mg total) by nebulization in the morning and at bedtime. (Patient not taking: Reported on 04/11/2021) 120 mL 11  ? budesonide (PULMICORT) 0.5 MG/2ML nebulizer solution Take 2 mLs (0.5 mg total) by nebulization daily. 60 mL 0  ? Cranberry 400 MG CAPS SMARTSIG:1 By Mouth    ? digoxin (LANOXIN) 0.125 MG tablet Take by mouth.    ? formoterol (PERFOROMIST) 20 MCG/2ML nebulizer solution Take 2 mLs (20 mcg total) by nebulization 2 (two) times daily. 120 mL 11  ? nitrofurantoin (MACRODANTIN) 100 MG capsule nitrofurantoin macrocrystal 100 mg capsule ? TAKE 1 CAPSULE BY MOUTH TWICE A DAY     ? ?No facility-administered medications prior to visit.  ? ? ?Review of Systems  ?Constitutional:  Negative for chills, diaphoresis, fever, malaise/fatigue and weight loss.  ?HENT:  Negative for congestion.   ?Respiratory:  Negative for cough, hemoptysis, sputum production, shortness of breath and wheezing.   ?Cardiovascular:  Negative for chest pain, palpitations and leg swelling.  ? ? ?Objective:  ? ?Vitals:  ? 04/11/21 1416  ?BP: (!) 112/48  ?Pulse: 95  ?SpO2: 97%  ?Weight: 133 lb (60.3 kg)  ?Height: '5\' 7"'$  (1.702 m)  ? ? ? ?SpO2:  97 % ?O2 Device: None (Room air) ? ?Physical Exam: ?General: Elderly, frail-appearing, no acute distress ?HENT: [place], AT ?Eyes: EOMI, no scleral icterus ?Respiratory: Diminished breath sounds bilaterally.  No crackles, wheezing or rales ?Cardiovascular: RRR, -M/R/G, no JVD ?Extremities:-Edema,-tenderness ?Neuro: AAO x2, CNII-XII grossly intact ?Psych: Normal mood, normal affect ? ?Data Reviewed: ? ?Imaging: ?CT Chest 08/17/20 - Scattered cylindrical bronchiectasis with interstitium and peribronchovascular ground glass attenuation and micro-nodularity. Complete atelectasis in the RML. LLL pleural nodules measuring 9 x 47m and 11 x 8 mm nodules ?CT Chest 12/19/20 - Resolution of pleural nodules in left lower lobe. Interval development of nodular opacities in upper lobes bilaterally. Unchanged atelectasis of RML. ?CT Chest 02/18/21 - Chronic RML atelectasis. Resolution of upper lobes bilaterally. Of note, improved left atrial thrombus noted. ? ?PFT: ?None on file ? ?Echo: ?09/2020 - EF 50-55%. TV systolic function is reduced. Severely biatrial dilation. Moderate MV, TR ? ?Labs: ?CBC ?   ?Component Value Date/Time  ? WBC 4.0 03/12/2021 1517  ? RBC 3.90 03/12/2021 1517  ? HGB 13.6 03/25/2021 1602  ? HCT 40.6 03/25/2021 1602  ? PLT 163.0 03/12/2021 1517  ? MCV 97.3 03/12/2021 1517  ? MCH 31.9 11/24/2020 2157  ? MCHC 33.3 03/12/2021 1517  ? RDW 14.6 03/12/2021 1517  ? LYMPHSABS 1.2 03/12/2021  1517  ? MONOABS 0.4 03/12/2021 1517  ? EOSABS 0.1 03/12/2021 1517  ? BASOSABS 0.0 03/12/2021 1517  ? ? ?Assessment & Plan:  ? ?Discussion: ?88year old female former smoker with dementia, atrial fibrillatio

## 2021-04-16 NOTE — Telephone Encounter (Signed)
Pt was seen by JE 3/7. Nothing further needed. ?

## 2021-04-23 ENCOUNTER — Ambulatory Visit: Payer: Medicare Other | Admitting: Cardiology

## 2021-04-23 ENCOUNTER — Encounter: Payer: Self-pay | Admitting: Cardiology

## 2021-04-23 VITALS — BP 140/67 | HR 84 | Temp 97.5°F | Resp 18 | Ht 67.0 in | Wt 133.9 lb

## 2021-04-23 DIAGNOSIS — I2510 Atherosclerotic heart disease of native coronary artery without angina pectoris: Secondary | ICD-10-CM

## 2021-04-23 DIAGNOSIS — I4821 Permanent atrial fibrillation: Secondary | ICD-10-CM

## 2021-04-23 DIAGNOSIS — I513 Intracardiac thrombosis, not elsewhere classified: Secondary | ICD-10-CM

## 2021-04-23 DIAGNOSIS — Z87891 Personal history of nicotine dependence: Secondary | ICD-10-CM

## 2021-04-23 DIAGNOSIS — Z8673 Personal history of transient ischemic attack (TIA), and cerebral infarction without residual deficits: Secondary | ICD-10-CM

## 2021-04-23 DIAGNOSIS — Z7901 Long term (current) use of anticoagulants: Secondary | ICD-10-CM

## 2021-04-23 DIAGNOSIS — I700 Atherosclerosis of aorta: Secondary | ICD-10-CM

## 2021-04-23 NOTE — Progress Notes (Signed)
? ?Date:  04/23/2021  ? ?ID:  Bridget [name], DOB November 25, 1921, MRN [id number] ? ?PCP:  Bridget Pao, [name]  ?Cardiologist:  Bridget Kras, DO, [facility]  (established care 08/28/2020) ? ?Date: 04/23/21 ?Last Office Visit: 12/25/2020 ? ?Chief Complaint  ?Patient presents with  ? Follow-up  ?  4 week ?Reevaluation of shortness of breath, lower extremity swelling. ?Reviewed test results  ? ? ?HPI  ?Bridget [name] is a 88 y.o. female whose past medical history and cardiovascular risk factors include: Atrial fibrillation (permanent, per daughter), hx of stroke (per daughter in 2017), history of breast cancer, osteoporosis bilateral hip fractures, dementia (per daughter), thrombus of the left atrial appendage (CT scan ), pulmonary nodule, advanced age, post menopausal female.  ? ?She is referred to the office at the request of Bridget [name] for evaluation of atrial fibrillation. ? ?Patient is accompanied by her daughter Bridget [name] at today's visit who provides collateral history as part of today's office visit. ? ?Patient presents today for follow-up for reevaluation of shortness of breath, lower extremity swelling, and elevated blood pressures.  She presented to the office in March 2023 for a sooner than her scheduled visit for the reasons mentioned above.  At the last office visit the shared decision was to increase that the diltiazem from 180 mg to 240 mg p.o. daily, echocardiogram, labs, and monitoring her blood pressures at home.  Since last office visit the daughter states that she has been doing much better after starting back on her Advair and discontinuing her renew her inhaler.  Her shortness of breath and lower extremity swelling has essentially resolved.  She had labs on 03/25/2020 which notes stable renal function and hemoglobin.  Patient has not been checking her blood pressures at home regularly.  And her echocardiogram is still pending. ? ?Of note, she was referred to the practice for management of atrial  fibrillation and a CT of the chest noting left atrial appendage thrombus.  In the past recommended a TEE to confirm left atrial appendage thrombus; however, given her frailty family chose to not proceed with a TEE and continue conservative management.  Her Pradaxa was changed to Eliquis and a repeat CT in January 2023 noted improvement in the left atrial appendage thrombus.  She has a repeat CT end of April 2023 to evaluate for the same. ? ?Patient denies any active chest pain or heart failure symptoms. ? ?FUNCTIONAL STATUS: ?Works with physical therapy for 45 minutes 3 times a week and on colder days and goes out for a walk with her daughter Bridget [name]. ? ?ALLERGIES: ?Allergies  ?Allergen Reactions  ? Erythromycin Other (See Comments)  ?  Unknown, but "all the mycins bother me"  ? Oxycodone Other (See Comments)  ?  confusion  ? Tramadol Other (See Comments)  ?  confusion  ? ? ?MEDICATION LIST PRIOR TO VISIT: ?Current Meds  ?Medication Sig  ? acetaminophen (TYLENOL) 500 MG tablet Take 1 tablet by mouth daily.  ? ADVAIR HFA 115-21 MCG/ACT inhaler Inhale 2 puffs into the lungs 2 (two) times daily.  ? apixaban (ELIQUIS) 5 MG TABS tablet Take 1 tablet (5 mg total) by mouth 2 (two) times daily.  ? AZO D-MANNOSE 500 MG CAPS   ? co-enzyme Q-10 30 MG capsule Take 30 mg by mouth 3 (three) times daily.  ? Cyanocobalamin (B-12) 1000 MCG TABS Take 1 tablet by mouth daily.  ? D-5000 125 MCG (5000 UT) TABS   ? denosumab (PROLIA) 60 MG/ML  SOSY injection Inject 60 mg into the skin See admin instructions. Every 6 months  ? diclofenac Sodium (VOLTAREN) 1 % GEL Apply topically 4 (four) times daily.  ? diltiazem (TIAZAC) 180 MG 24 hr capsule Take 180 mg by mouth daily.  ? docusate sodium (COLACE) 100 MG capsule 100 mg daily as needed.  ? ketoconazole (NIZORAL) 2 % shampoo See admin instructions.  ? Lactobacillus (ACIDOPHILUS PROBIOTIC) 100 MG CAPS Take 1 capsule by mouth daily.  ? memantine (NAMENDA) 10 MG tablet Take 1 tablet by mouth 2  (two) times daily.  ? Multiple Vitamins-Minerals (PRESERVISION AREDS 2) CAPS Take 1 capsule by mouth in the morning and at bedtime.  ? NON FORMULARY melatonin cbd  ? Omega-3 Fatty Acids (OMEGA-3 FISH OIL PO) Take 1 tablet by mouth daily at 12 noon.  ? Resveratrol 250 MG CAPS Take by mouth.  ? senna (SENOKOT) 8.6 MG TABS tablet SMARTSIG:1 By Mouth  ? [DISCONTINUED] diltiazem (TIAZAC) 240 MG 24 hr capsule Take 1 capsule (240 mg total) by mouth daily. (Patient taking differently: Take 240 mg by mouth daily. Taking 160 due to insurance)  ?  ? ?PAST MEDICAL HISTORY: ?Past Medical History:  ?Diagnosis Date  ? Atherosclerosis of aorta ([place])   ? Atrial fibrillation ([place])   ? Breast cancer ([place])   ? Coronary atherosclerosis due to calcified coronary lesion   ? Stroke [name])   ? Thrombus of left atrial appendage   ? ? ?PAST SURGICAL HISTORY: ?Past Surgical History:  ?Procedure Laterality Date  ? BREAST LUMPECTOMY Left 2001  ? FEMUR FRACTURE SURGERY Right 2019  ? RIGHT - 2015  ? TOTAL ABDOMINAL HYSTERECTOMY  1967  ? ? ?FAMILY HISTORY: ?The patient family history includes Other in her sister. ? ?SOCIAL HISTORY:  ?The patient  reports that she quit smoking about 73 years ago. Her smoking use included cigarettes. She has a 10.50 pack-year smoking history. She has never used smokeless tobacco. She reports that she does not drink alcohol and does not use drugs. ? ?REVIEW OF SYSTEMS: ?Review of Systems  ?Cardiovascular:  Negative for chest pain, claudication, dyspnea on exertion, leg swelling, orthopnea, palpitations, paroxysmal nocturnal dyspnea and syncope.  ?Respiratory:  Negative for shortness of breath.   ?Musculoskeletal:  Positive for joint pain.  ? ?PHYSICAL EXAM: ? ?  04/23/2021  ?  2:49 PM 04/11/2021  ?  2:16 PM 03/25/2021  ?  2:35 PM  ?Vitals with BMI  ?Height '5\' 7"'$  '5\' 7"'$  '5\' 7"'$   ?Weight 133 lbs 14 oz 133 lbs 137 lbs 10 oz  ?BMI 20.96 20.83 21.55  ?Systolic 540 981 191  ?Diastolic 67 48 65  ?Pulse 84 95 62  ? ? ?CONSTITUTIONAL:  Age-appropriate female, walks with a cane, hemodynamically stable, frail, no acute distress.  ?SKIN: Skin is warm and dry. No rash noted. No cyanosis. No pallor. No jaundice ?HEAD: Normocephalic and atraumatic.  ?EYES: No scleral icterus ?MOUTH/THROAT: Moist oral membranes.  ?NECK: No JVD present. No thyromegaly noted. No carotid bruits  ?LYMPHATIC: No visible cervical adenopathy.  ?CHEST Normal respiratory effort. No intercostal retractions  ?LUNGS: Clear to auscultation bilaterally.  No stridor. No wheezes. No rales.  ?CARDIOVASCULAR: Irregularly irregular, soft holosystolic murmur heard at the apex, variable S1-S2, no gallops or rubs. ?ABDOMINAL: Soft, nontender, nondistended, positive bowel sounds in all 4 quadrants, no apparent ascites.  ?EXTREMITIES: No peripheral edema, 2+ DP and PT pulses. ?HEMATOLOGIC: No significant bruising ?NEUROLOGIC: Oriented to person, place, and time. Nonfocal. Normal muscle tone.  ?  PSYCHIATRIC: Normal mood and affect. Normal behavior. Cooperative ? ?RADIOLOGY: ?CT chest with contrast: ?08/17/2020 ?IMPRESSION: ?1. Scattered areas of bronchiectasis are noted in the lungs bilaterally with associated chronic scarring/atelectasis in the ?right middle lobe. Notably, there also 2 large pulmonary nodules in the posterior aspect of the left lower lobe. These are favored to be of infectious or inflammatory etiology, but underlying neoplasm is difficult to entirely exclude. Accordingly, short-term repeat chest CT is recommended 1-2 months to ensure the stability or regression of these findings. ?2. Cardiomegaly with biatrial dilatation. Notably, there is a large filling defect in the tip of the left atrial appendage highly concerning for left atrial appendage thrombus. If present, this places the patient at high risk for systemic embolization. Further evaluation with transesophageal echocardiography is recommended if clinically appropriate. ?3. Trace left pleural effusion lying dependently. ?4.  Aortic atherosclerosis, in addition to left main and 3 vessel coronary artery disease. ?5. There are calcifications of the aortic valve and mitral annulus. Echocardiographic correlation for evaluation of po

## 2021-05-24 ENCOUNTER — Telehealth (HOSPITAL_COMMUNITY): Payer: Self-pay | Admitting: Emergency Medicine

## 2021-05-24 NOTE — Telephone Encounter (Signed)
Unable to leave vm.

## 2021-05-28 ENCOUNTER — Ambulatory Visit (HOSPITAL_COMMUNITY): Admission: RE | Admit: 2021-05-28 | Payer: Medicare Other | Source: Ambulatory Visit

## 2021-06-04 ENCOUNTER — Ambulatory Visit: Payer: Medicare Other | Admitting: Cardiology

## 2021-06-05 ENCOUNTER — Telehealth (HOSPITAL_COMMUNITY): Payer: Self-pay | Admitting: Emergency Medicine

## 2021-06-05 NOTE — Telephone Encounter (Signed)
Attempted to call patient regarding upcoming cardiac CT appointment. °Left message on voicemail with name and callback number °Garey Alleva RN Navigator Cardiac Imaging °[facility] °[phone] Office °[phone] Cell ° °

## 2021-06-06 ENCOUNTER — Ambulatory Visit (HOSPITAL_COMMUNITY)
Admission: RE | Admit: 2021-06-06 | Discharge: 2021-06-06 | Disposition: A | Discharge status: Home / Self Care | Payer: Medicare Other | Source: Ambulatory Visit | Attending: Cardiology | Admitting: Cardiology

## 2021-06-06 DIAGNOSIS — I513 Intracardiac thrombosis, not elsewhere classified: Secondary | ICD-10-CM | POA: Insufficient documentation

## 2021-06-06 DIAGNOSIS — I4821 Permanent atrial fibrillation: Secondary | ICD-10-CM | POA: Insufficient documentation

## 2021-06-06 DIAGNOSIS — Z7901 Long term (current) use of anticoagulants: Secondary | ICD-10-CM | POA: Diagnosis present

## 2021-06-06 MED ORDER — METOPROLOL TARTRATE 5 MG/5ML IV SOLN
INTRAVENOUS | Status: AC
  Administered 2021-06-06: 5 mg via INTRAVENOUS
  Filled 2021-06-06: qty 5

## 2021-06-06 MED ORDER — METOPROLOL TARTRATE 5 MG/5ML IV SOLN: 5.0000 mg | Freq: Once | INTRAVENOUS | Status: AC

## 2021-06-06 MED ORDER — IOHEXOL 350 MG/ML SOLN
100.0000 mL | Freq: Once | INTRAVENOUS | Status: AC | PRN
  Administered 2021-06-06: 100 mL via INTRAVENOUS

## 2021-06-10 ENCOUNTER — Encounter (INDEPENDENT_AMBULATORY_CARE_PROVIDER_SITE_OTHER): Payer: Medicare Other | Admitting: Ophthalmology

## 2021-06-10 ENCOUNTER — Other Ambulatory Visit: Payer: Self-pay | Admitting: Cardiology

## 2021-06-10 DIAGNOSIS — H353231 Exudative age-related macular degeneration, bilateral, with active choroidal neovascularization: Secondary | ICD-10-CM | POA: Diagnosis not present

## 2021-06-10 DIAGNOSIS — I4821 Permanent atrial fibrillation: Secondary | ICD-10-CM

## 2021-06-10 DIAGNOSIS — I10 Essential (primary) hypertension: Secondary | ICD-10-CM | POA: Diagnosis not present

## 2021-06-10 DIAGNOSIS — H35033 Hypertensive retinopathy, bilateral: Secondary | ICD-10-CM | POA: Diagnosis not present

## 2021-06-10 DIAGNOSIS — H43813 Vitreous degeneration, bilateral: Secondary | ICD-10-CM | POA: Diagnosis not present

## 2021-06-11 NOTE — Progress Notes (Signed)
Called patient, NA, mailbox is full and cannot receive any messages.

## 2021-06-13 ENCOUNTER — Ambulatory Visit: Payer: Medicare Other | Admitting: Cardiology

## 2021-06-13 ENCOUNTER — Encounter: Payer: Self-pay | Admitting: Cardiology

## 2021-06-13 VITALS — BP 109/57 | HR 67 | Resp 16 | Ht 67.0 in | Wt 133.0 lb

## 2021-06-13 DIAGNOSIS — I4821 Permanent atrial fibrillation: Secondary | ICD-10-CM

## 2021-06-13 DIAGNOSIS — Z87891 Personal history of nicotine dependence: Secondary | ICD-10-CM

## 2021-06-13 DIAGNOSIS — I2510 Atherosclerotic heart disease of native coronary artery without angina pectoris: Secondary | ICD-10-CM

## 2021-06-13 DIAGNOSIS — Z7901 Long term (current) use of anticoagulants: Secondary | ICD-10-CM

## 2021-06-13 DIAGNOSIS — Z8673 Personal history of transient ischemic attack (TIA), and cerebral infarction without residual deficits: Secondary | ICD-10-CM

## 2021-06-13 DIAGNOSIS — I513 Intracardiac thrombosis, not elsewhere classified: Secondary | ICD-10-CM

## 2021-06-13 DIAGNOSIS — I700 Atherosclerosis of aorta: Secondary | ICD-10-CM

## 2021-06-13 NOTE — Progress Notes (Signed)
Date:  06/13/2021   ID:  [name], DOB [birth year], MRN [id number]  PCP:  Haywood Pao, MD  Cardiologist:  Rex Kras, DO, [facility]  (established care 08/28/2020)  Date: 06/13/21 Last Office Visit: 04/23/2021  Chief Complaint  Patient presents with   Follow-up    Atrial fibrillation management and review CT results    HPI  Bridget [name] is a 88 y.o. female whose past medical history and cardiovascular risk factors include: Atrial fibrillation (permanent, per daughter), hx of stroke (per daughter in 2017), history of breast cancer, osteoporosis bilateral hip fractures, dementia (per daughter), thrombus of the left atrial appendage (CT scan ), pulmonary nodule, advanced age, post menopausal female.   She is referred to the office at the request of Tisovec, Fransico Him, MD for evaluation of atrial fibrillation.  Patient is accompanied by her daughter Kennyth Lose at today's visit who provides collateral history as part of today's office visit.  Established care in August 2022 for management of permanent atrial fibrillation and CT of the chest that was performed due to chronic cough noted left atrial appendage thrombus.  Anticoagulation was changed from Pradaxa to Eliquis which she has tolerated well.  We discussed undergoing transesophageal echocardiogram to evaluate the left atrial appendage thrombus but given her age and frailty family decided against invasive interventions.  Therefore the next best noninvasive testing was a CT scan to monitor the progression of thrombus resolution.  She recently had a CT chest pulmonary vein isolation protocol which notes a filling defect within the left atrial appendage consistent with thrombus.  When comparing the most recent study to the initial the overall size of the filling defect has reduced likely due to resolution of the thrombus.  Patient remains asymptomatic with regards to A-fib.  Her ventricular rate at home is around 70 bpm according to her  daughter Kennyth Lose.  Does not endorse any evidence of bleeding.  No recent hospitalizations or urgent care visits for cardiovascular symptoms.  FUNCTIONAL STATUS: Works with physical therapy for 45 minutes 3 times a week and on colder days and goes out for a walk with her daughter Kennyth Lose.  ALLERGIES: Allergies  Allergen Reactions   Erythromycin Other (See Comments)    Unknown, but "all the mycins bother me"   Oxycodone Other (See Comments)    confusion   Tramadol Other (See Comments)    confusion    MEDICATION LIST PRIOR TO VISIT: Current Meds  Medication Sig   acetaminophen (TYLENOL) 500 MG tablet Take 1 tablet by mouth daily.   ADVAIR HFA 115-21 MCG/ACT inhaler Inhale 2 puffs into the lungs 2 (two) times daily.   AZO D-MANNOSE 500 MG CAPS    co-enzyme Q-10 30 MG capsule Take 30 mg by mouth 3 (three) times daily.   Cyanocobalamin (B-12) 1000 MCG TABS Take 1 tablet by mouth daily.   D-5000 125 MCG (5000 UT) TABS    denosumab (PROLIA) 60 MG/ML SOSY injection Inject 60 mg into the skin See admin instructions. Every 6 months   diclofenac Sodium (VOLTAREN) 1 % GEL Apply topically 4 (four) times daily.   diltiazem (TIAZAC) 180 MG 24 hr capsule Take 180 mg by mouth daily.   docusate sodium (COLACE) 100 MG capsule 100 mg daily as needed.   ketoconazole (NIZORAL) 2 % shampoo See admin instructions.   Lactobacillus (ACIDOPHILUS PROBIOTIC) 100 MG CAPS Take 1 capsule by mouth daily.   memantine (NAMENDA) 10 MG tablet Take 1 tablet by mouth 2 (two) times  daily.   Multiple Vitamins-Minerals (PRESERVISION AREDS 2) CAPS Take 1 capsule by mouth in the morning and at bedtime.   NON FORMULARY melatonin cbd   Omega-3 Fatty Acids (OMEGA-3 FISH OIL PO) Take 1 tablet by mouth daily at 12 noon.   predniSONE (STERAPRED UNI-PAK 21 TAB) 5 MG (21) TBPK tablet Take by mouth.   Resveratrol 250 MG CAPS Take by mouth.   senna (SENOKOT) 8.6 MG TABS tablet SMARTSIG:1 By Mouth     PAST MEDICAL HISTORY: Past  Medical History:  Diagnosis Date   Atherosclerosis of aorta (HCC)    Atrial fibrillation ([place])    Breast cancer ([place])    Coronary atherosclerosis due to calcified coronary lesion    Stroke ([place])    Thrombus of left atrial appendage     PAST SURGICAL HISTORY: Past Surgical History:  Procedure Laterality Date   BREAST LUMPECTOMY Left 2001   FEMUR FRACTURE SURGERY Right 2019   RIGHT - 2015   TOTAL ABDOMINAL HYSTERECTOMY  1967    FAMILY HISTORY: The patient family history includes Other in her sister.  SOCIAL HISTORY:  The patient  reports that she quit smoking about 73 years ago. Her smoking use included cigarettes. She has a 10.50 pack-year smoking history. She has never used smokeless tobacco. She reports that she does not drink alcohol and does not use drugs.  REVIEW OF SYSTEMS: Review of Systems  Cardiovascular:  Negative for chest pain, claudication, dyspnea on exertion, leg swelling, orthopnea, palpitations, paroxysmal nocturnal dyspnea and syncope.  Respiratory:  Negative for shortness of breath.   Musculoskeletal:  Positive for joint pain.    PHYSICAL EXAM:    06/13/2021    2:25 PM 06/06/2021    2:36 PM 06/06/2021    2:24 PM  Vitals with BMI  Height $Remov'5\' 7"'AmQlGl$     Weight 133 lbs    BMI 64.15    Systolic 830 940   Diastolic 57 75   Pulse 67 68 76    CONSTITUTIONAL: Age-appropriate female, presents in a wheelchair, hemodynamically stable, frail, no acute distress.  SKIN: Skin is warm and dry. No rash noted. No cyanosis. No pallor. No jaundice HEAD: Normocephalic and atraumatic.  EYES: No scleral icterus MOUTH/THROAT: Moist oral membranes.  NECK: No JVD present. No thyromegaly noted. No carotid bruits  CHEST Normal respiratory effort. No intercostal retractions  LUNGS: Clear to auscultation bilaterally.  No stridor. No wheezes. No rales.  CARDIOVASCULAR: Irregularly irregular, soft holosystolic murmur heard at the apex, variable S1-S2, no gallops or rubs. ABDOMINAL:  Soft, nontender, nondistended, positive bowel sounds in all 4 quadrants, no apparent ascites.  EXTREMITIES: No peripheral edema, 2+ DP and PT pulses. HEMATOLOGIC: No significant bruising NEUROLOGIC: Oriented to person, place, and time. Nonfocal. Normal muscle tone.  PSYCHIATRIC: Normal mood and affect. Normal behavior. Cooperative  RADIOLOGY: CT chest with contrast: 08/17/2020 IMPRESSION: 1. Scattered areas of bronchiectasis are noted in the lungs bilaterally with associated chronic scarring/atelectasis in the right middle lobe. Notably, there also 2 large pulmonary nodules in the posterior aspect of the left lower lobe. These are favored to be of infectious or inflammatory etiology, but underlying neoplasm is difficult to entirely exclude. Accordingly, short-term repeat chest CT is recommended 1-2 months to ensure the stability or regression of these findings. 2. Cardiomegaly with biatrial dilatation. Notably, there is a large filling defect in the tip of the left atrial appendage highly concerning for left atrial appendage thrombus. If present, this places the patient at high risk for systemic  embolization. Further evaluation with transesophageal echocardiography is recommended if clinically appropriate. 3. Trace left pleural effusion lying dependently. 4. Aortic atherosclerosis, in addition to left main and 3 vessel coronary artery disease. 5. There are calcifications of the aortic valve and mitral annulus. Echocardiographic correlation for evaluation of potential valvular dysfunction may be warranted if clinically indicated.  CT chest without contrast 12/21/2020: 1. Pulmonary nodules in the posterior left lower lobe have resolved since 08/17/2020 and compatible with resolved infection or inflammation. However, there are multiple new areas of airspace disease throughout both lungs, most prominent in the upper lobes. Findings are most compatible with infectious or inflammatory changes. In  addition, there are scattered areas of bronchiectasis with areas of mucoid impaction. 2. Chronic collapse of the right middle lobe. 3. Cardiomegaly. 4.  Aortic Atherosclerosis (ICD10-I70.0).  CARDIAC DATABASE: EKG: 08/28/2020: Atrial fibrillation, 78 bpm, right bundle branch block, without underlying injury pattern.    Echocardiogram: 09/13/2020: LVEF 50-55%, no regional wall motion abnormalities, diastolic dysfunction not evaluated due to underlying rhythm being atrial fibrillation, biatrial dilatation, moderate MR, moderate TR, no pulmonary hypertension, aortic plaque within the descending aorta.   LABORATORY DATA: External Labs: Collected: 05/02/2020 Creatinine 0.8 mg/dL. eGFR: 5mL/min per 1.73 m AST 18, ALT 10, alkaline phosphatase 61 Hemoglobin 13.3 g/dL, hematocrit 39.7  Collected: 10/31/2019: Hemoglobin 14.6 g/dL, hematocrit 44.2%  IMPRESSION:    ICD-10-CM   1. Permanent atrial fibrillation (HCC)  F81.01 Basic metabolic panel    Hemoglobin and hematocrit, blood    2. Long term (current) use of anticoagulants  B51.02 Basic metabolic panel    Hemoglobin and hematocrit, blood    3. Thrombus of left atrial appendage  I51.3     4. History of stroke  Z86.73     5. Coronary atherosclerosis due to calcified coronary lesion  I25.10    I25.84     6. Atherosclerosis of aorta (HCC)  I70.0     7. Former smoker  Z87.891       RECOMMENDATIONS: Bridget [name] is a 88 y.o. female whose past medical history and cardiac risk factors include: Atrial fibrillation (permanent, per daughter), hx of stroke (per daughter in 31), history of breast cancer, osteoporosis bilateral hip fractures, dementia (per daughter), thrombus of the left atrial appendage (CT scan ), pulmonary nodule, advanced age, post menopausal female.   Permanent atrial fibrillation (HCC) Rate control: Diltiazem. Rhythm control: N/A. Thromboembolic prophylaxis: Eliquis Patient underlying rate is well controlled on  current medical therapy.  Long term (current) use of anticoagulants Indication: Permanent atrial fibrillation and left atrial appendage thrombus. Was on Pradaxa and due to findings of the left atrial appendage thrombus transitioned to Eliquis. CHA2DS2-VASc SCORE is 6 which correlates to 9.8% risk of stroke per year (HTN, age, history of stroke, gender).  Patient and her daughter do not endorse evidence of bleeding. Check hemoglobin and hematocrit and BMP prior to next office visit in 6 months.  Thrombus of left atrial appendage Cardiac CT pulmonary vein protocol images reviewed with the patient's daughter Kennyth Lose at today's office visit.  The degree of filling defect has improved since the initial study in April 2022.  Continue current medical therapy. Patient has tolerated Eliquis well and the degree of the filling defect continues to improve study after study. Given her age, frailty, this shared decision was to hold off on additional CT scans to reevaluate the thrombus size. Patient and daughter are aware that if she has embolization of the thrombus it can lead to TIA/strokelike symptoms.  FINAL MEDICATION LIST END OF ENCOUNTER: No orders of the defined types were placed in this encounter.    There are no discontinued medications.    Current Outpatient Medications:    acetaminophen (TYLENOL) 500 MG tablet, Take 1 tablet by mouth daily., Disp: , Rfl:    ADVAIR HFA 115-21 MCG/ACT inhaler, Inhale 2 puffs into the lungs 2 (two) times daily., Disp: , Rfl:    AZO D-MANNOSE 500 MG CAPS, , Disp: , Rfl:    co-enzyme Q-10 30 MG capsule, Take 30 mg by mouth 3 (three) times daily., Disp: , Rfl:    Cyanocobalamin (B-12) 1000 MCG TABS, Take 1 tablet by mouth daily., Disp: , Rfl:    D-5000 125 MCG (5000 UT) TABS, , Disp: , Rfl:    denosumab (PROLIA) 60 MG/ML SOSY injection, Inject 60 mg into the skin See admin instructions. Every 6 months, Disp: , Rfl:    diclofenac Sodium (VOLTAREN) 1 % GEL, Apply  topically 4 (four) times daily., Disp: , Rfl:    diltiazem (TIAZAC) 180 MG 24 hr capsule, Take 180 mg by mouth daily., Disp: , Rfl:    docusate sodium (COLACE) 100 MG capsule, 100 mg daily as needed., Disp: , Rfl:    ketoconazole (NIZORAL) 2 % shampoo, See admin instructions., Disp: , Rfl:    Lactobacillus (ACIDOPHILUS PROBIOTIC) 100 MG CAPS, Take 1 capsule by mouth daily., Disp: , Rfl:    memantine (NAMENDA) 10 MG tablet, Take 1 tablet by mouth 2 (two) times daily., Disp: , Rfl:    Multiple Vitamins-Minerals (PRESERVISION AREDS 2) CAPS, Take 1 capsule by mouth in the morning and at bedtime., Disp: , Rfl:    NON FORMULARY, melatonin cbd, Disp: , Rfl:    Omega-3 Fatty Acids (OMEGA-3 FISH OIL PO), Take 1 tablet by mouth daily at 12 noon., Disp: , Rfl:    predniSONE (STERAPRED UNI-PAK 21 TAB) 5 MG (21) TBPK tablet, Take by mouth., Disp: , Rfl:    Resveratrol 250 MG CAPS, Take by mouth., Disp: , Rfl:    senna (SENOKOT) 8.6 MG TABS tablet, SMARTSIG:1 By Mouth, Disp: , Rfl:    apixaban (ELIQUIS) 5 MG TABS tablet, Take 1 tablet (5 mg total) by mouth 2 (two) times daily., Disp: 180 tablet, Rfl: 1  Orders Placed This Encounter  Procedures   Basic metabolic panel   Hemoglobin and hematocrit, blood    There are no Patient Instructions on file for this visit.   --Continue cardiac medications as reconciled in final medication list. --Return in about 6 months (around 12/13/2021) for Follow up, A. fib. Or sooner if needed. --Continue follow-up with your primary care physician regarding the management of your other chronic comorbid conditions.  Patient's questions and concerns were addressed to her satisfaction. She voices understanding of the instructions provided during this encounter.   This note was created using a voice recognition software as a result there may be grammatical errors inadvertently enclosed that do not reflect the nature of this encounter. Every attempt is made to correct such  errors.  Rex Kras, [place], [facility]  Pager: [phone] Office: [phone]

## 2021-06-25 ENCOUNTER — Ambulatory Visit: Payer: Medicare Other | Admitting: Cardiology

## 2021-07-02 ENCOUNTER — Ambulatory Visit (INDEPENDENT_AMBULATORY_CARE_PROVIDER_SITE_OTHER): Payer: Medicare Other | Admitting: Podiatry

## 2021-07-02 DIAGNOSIS — G629 Polyneuropathy, unspecified: Secondary | ICD-10-CM

## 2021-07-02 DIAGNOSIS — M79675 Pain in left toe(s): Secondary | ICD-10-CM | POA: Diagnosis not present

## 2021-07-02 DIAGNOSIS — H903 Sensorineural hearing loss, bilateral: Secondary | ICD-10-CM | POA: Insufficient documentation

## 2021-07-02 DIAGNOSIS — B351 Tinea unguium: Secondary | ICD-10-CM

## 2021-07-02 DIAGNOSIS — E7800 Pure hypercholesterolemia, unspecified: Secondary | ICD-10-CM | POA: Insufficient documentation

## 2021-07-02 DIAGNOSIS — M79674 Pain in right toe(s): Secondary | ICD-10-CM

## 2021-07-02 DIAGNOSIS — Z0000 Encounter for general adult medical examination without abnormal findings: Secondary | ICD-10-CM | POA: Insufficient documentation

## 2021-07-09 ENCOUNTER — Encounter: Payer: Self-pay | Admitting: Podiatry

## 2021-07-09 NOTE — Progress Notes (Signed)
  Subjective:  Patient ID: Bridget [name], female    DOB: 1921/10/20,  MRN: [id number]  Bridget [name] presents to clinic today for at risk foot care with history of peripheral neuropathy and painful elongated mycotic toenails 1-5 bilaterally which are tender when wearing enclosed shoe gear. Pain is relieved with periodic professional debridement.  New problem(s): None.   PCP is Tisovec, Fransico Him, MD , and last visit was June 12, 2021.  Allergies  Allergen Reactions   Erythromycin Other (See Comments)    Unknown, but "all the mycins bother me"   Oxycodone Other (See Comments)    confusion   Tramadol Other (See Comments)    confusion    Review of Systems: Negative except as noted in the HPI.  Objective: No changes noted in today's physical examination. Constitutional Patient is a pleasant 88 y.o. Caucasian female frail, in NAD. AAO x 3.  Vascular CFT <3 seconds b/l LE. Palpable DP pulse(s) b/l LE. Palpable PT pulse(s) b/l LE. Pedal hair absent. No pain with calf compression b/l. Lower extremity skin temperature gradient within normal limits. No edema noted b/l LE. No cyanosis or clubbing noted b/l LE.   Neurologic Normal speech. Pt has subjective symptoms of neuropathy. Protective sensation intact 5/5 intact bilaterally with 10g monofilament b/l. Vibratory sensation intact b/l.  Dermatologic Pedal skin thin and atrophic b/l LE. No open wounds b/l LE. No interdigital macerations noted b/l LE. Toenails 1-5 b/l elongated, discolored, dystrophic, thickened, crumbly with subungual debris and tenderness to dorsal palpation. No hyperkeratotic nor porokeratotic lesions present on today's visit.  Orthopedic: Muscle strength 5/5 to all lower extremity muscle groups bilaterally. Hammertoe deformity noted 2-5 b/l.  Assessment/Plan: 1. Pain due to onychomycosis of toenails of both feet   2. Polyneuropathy   -Examined patient. -Patient to continue soft, supportive shoe gear daily. -Mycotic toenails 1-5  bilaterally were debrided in length and girth with sterile nail nippers and dremel without incident. -Patient/POA to call should there be question/concern in the interim.   Return in about 3 months (around 10/02/2021).  Marzetta Board, DPM

## 2021-08-19 ENCOUNTER — Encounter (INDEPENDENT_AMBULATORY_CARE_PROVIDER_SITE_OTHER): Payer: Medicare Other | Admitting: Ophthalmology

## 2021-08-19 DIAGNOSIS — H35033 Hypertensive retinopathy, bilateral: Secondary | ICD-10-CM | POA: Diagnosis not present

## 2021-08-19 DIAGNOSIS — I10 Essential (primary) hypertension: Secondary | ICD-10-CM

## 2021-08-19 DIAGNOSIS — H43813 Vitreous degeneration, bilateral: Secondary | ICD-10-CM

## 2021-08-19 DIAGNOSIS — H353231 Exudative age-related macular degeneration, bilateral, with active choroidal neovascularization: Secondary | ICD-10-CM | POA: Diagnosis not present

## 2021-09-15 ENCOUNTER — Emergency Department (HOSPITAL_COMMUNITY): Payer: Medicare Other

## 2021-09-15 ENCOUNTER — Encounter (HOSPITAL_COMMUNITY): Payer: Self-pay

## 2021-09-15 ENCOUNTER — Inpatient Hospital Stay (HOSPITAL_COMMUNITY)
Admission: EM | Admit: 2021-09-15 | Discharge: 2021-09-18 | DRG: 871 | Disposition: A | Discharge status: Home / Self Care | Payer: Medicare Other | Attending: Family Medicine | Admitting: Family Medicine

## 2021-09-15 DIAGNOSIS — F03B0 Unspecified dementia, moderate, without behavioral disturbance, psychotic disturbance, mood disturbance, and anxiety: Secondary | ICD-10-CM | POA: Diagnosis not present

## 2021-09-15 DIAGNOSIS — Z90710 Acquired absence of both cervix and uterus: Secondary | ICD-10-CM

## 2021-09-15 DIAGNOSIS — I700 Atherosclerosis of aorta: Secondary | ICD-10-CM | POA: Diagnosis present

## 2021-09-15 DIAGNOSIS — Z885 Allergy status to narcotic agent status: Secondary | ICD-10-CM

## 2021-09-15 DIAGNOSIS — R17 Unspecified jaundice: Secondary | ICD-10-CM | POA: Diagnosis present

## 2021-09-15 DIAGNOSIS — J189 Pneumonia, unspecified organism: Secondary | ICD-10-CM | POA: Diagnosis present

## 2021-09-15 DIAGNOSIS — R739 Hyperglycemia, unspecified: Secondary | ICD-10-CM | POA: Diagnosis present

## 2021-09-15 DIAGNOSIS — Z87891 Personal history of nicotine dependence: Secondary | ICD-10-CM | POA: Diagnosis not present

## 2021-09-15 DIAGNOSIS — R6520 Severe sepsis without septic shock: Secondary | ICD-10-CM | POA: Diagnosis present

## 2021-09-15 DIAGNOSIS — Z20822 Contact with and (suspected) exposure to covid-19: Secondary | ICD-10-CM | POA: Diagnosis present

## 2021-09-15 DIAGNOSIS — I2584 Coronary atherosclerosis due to calcified coronary lesion: Secondary | ICD-10-CM | POA: Diagnosis present

## 2021-09-15 DIAGNOSIS — J470 Bronchiectasis with acute lower respiratory infection: Secondary | ICD-10-CM | POA: Diagnosis present

## 2021-09-15 DIAGNOSIS — J479 Bronchiectasis, uncomplicated: Secondary | ICD-10-CM | POA: Diagnosis not present

## 2021-09-15 DIAGNOSIS — G609 Hereditary and idiopathic neuropathy, unspecified: Secondary | ICD-10-CM | POA: Diagnosis present

## 2021-09-15 DIAGNOSIS — R0902 Hypoxemia: Secondary | ICD-10-CM | POA: Diagnosis present

## 2021-09-15 DIAGNOSIS — M1990 Unspecified osteoarthritis, unspecified site: Secondary | ICD-10-CM | POA: Diagnosis present

## 2021-09-15 DIAGNOSIS — Z8673 Personal history of transient ischemic attack (TIA), and cerebral infarction without residual deficits: Secondary | ICD-10-CM

## 2021-09-15 DIAGNOSIS — A419 Sepsis, unspecified organism: Secondary | ICD-10-CM | POA: Diagnosis present

## 2021-09-15 DIAGNOSIS — Z79899 Other long term (current) drug therapy: Secondary | ICD-10-CM | POA: Diagnosis not present

## 2021-09-15 DIAGNOSIS — I2510 Atherosclerotic heart disease of native coronary artery without angina pectoris: Secondary | ICD-10-CM | POA: Diagnosis present

## 2021-09-15 DIAGNOSIS — I482 Chronic atrial fibrillation, unspecified: Secondary | ICD-10-CM | POA: Diagnosis present

## 2021-09-15 DIAGNOSIS — Z7901 Long term (current) use of anticoagulants: Secondary | ICD-10-CM

## 2021-09-15 DIAGNOSIS — I513 Intracardiac thrombosis, not elsewhere classified: Secondary | ICD-10-CM | POA: Diagnosis present

## 2021-09-15 DIAGNOSIS — E785 Hyperlipidemia, unspecified: Secondary | ICD-10-CM | POA: Diagnosis present

## 2021-09-15 DIAGNOSIS — Z7951 Long term (current) use of inhaled steroids: Secondary | ICD-10-CM

## 2021-09-15 DIAGNOSIS — F0390 Unspecified dementia without behavioral disturbance: Secondary | ICD-10-CM | POA: Diagnosis present

## 2021-09-15 DIAGNOSIS — Z881 Allergy status to other antibiotic agents status: Secondary | ICD-10-CM

## 2021-09-15 DIAGNOSIS — Z853 Personal history of malignant neoplasm of breast: Secondary | ICD-10-CM

## 2021-09-15 DIAGNOSIS — R918 Other nonspecific abnormal finding of lung field: Secondary | ICD-10-CM | POA: Diagnosis present

## 2021-09-15 HISTORY — DX: Bronchitis, not specified as acute or chronic: J40

## 2021-09-15 HISTORY — DX: Nocturnal polyuria: R35.81

## 2021-09-15 LAB — COMPREHENSIVE METABOLIC PANEL
ALT: 25 U/L (ref 0–44)
AST: 24 U/L (ref 15–41)
Albumin: 4.1 g/dL (ref 3.5–5.0)
Alkaline Phosphatase: 50 U/L (ref 38–126)
Anion gap: 11 (ref 5–15)
BUN: 15 mg/dL (ref 8–23)
CO2: 28 mmol/L (ref 22–32)
Calcium: 9.3 mg/dL (ref 8.9–10.3)
Chloride: 103 mmol/L (ref 98–111)
Creatinine, Ser: 0.77 mg/dL (ref 0.44–1.00)
GFR, Estimated: >60 mL/min (ref >60)
Glucose, Bld: 160 mg/dL — ABNORMAL HIGH (ref 70–99)
Potassium: 3.8 mmol/L (ref 3.5–5.1)
Sodium: 142 mmol/L (ref 135–145)
Total Bilirubin: 1.4 mg/dL — ABNORMAL HIGH (ref 0.3–1.2)
Total Protein: 6.8 g/dL (ref 6.5–8.1)

## 2021-09-15 LAB — CBC WITH DIFFERENTIAL/PLATELET
Abs Immature Granulocytes: 0.03 10*3/uL (ref 0.00–0.07)
Basophils Absolute: 0 10*3/uL (ref 0.0–0.1)
Basophils Relative: 0 %
Eosinophils Absolute: 0 10*3/uL (ref 0.0–0.5)
Eosinophils Relative: 0 %
HCT: 42.3 % (ref 36.0–46.0)
Hemoglobin: 13.7 g/dL (ref 12.0–15.0)
Immature Granulocytes: 0 %
Lymphocytes Relative: 7 %
Lymphs Abs: 0.6 10*3/uL — ABNORMAL LOW (ref 0.7–4.0)
MCH: 33.3 pg (ref 26.0–34.0)
MCHC: 32.4 g/dL (ref 30.0–36.0)
MCV: 102.9 fL — ABNORMAL HIGH (ref 80.0–100.0)
Monocytes Absolute: 0.3 10*3/uL (ref 0.1–1.0)
Monocytes Relative: 3 %
Neutro Abs: 8.3 10*3/uL — ABNORMAL HIGH (ref 1.7–7.7)
Neutrophils Relative %: 90 %
Platelets: 181 10*3/uL (ref 150–400)
RBC: 4.11 MIL/uL (ref 3.87–5.11)
RDW: 14.3 % (ref 11.5–15.5)
WBC: 9.3 10*3/uL (ref 4.0–10.5)
nRBC: 0 % (ref 0.0–0.2)

## 2021-09-15 LAB — PHOSPHORUS: Phosphorus: 2.4 mg/dL — ABNORMAL LOW (ref 2.5–4.6)

## 2021-09-15 LAB — RESP PANEL BY RT-PCR (FLU A&B, COVID) ARPGX2
Influenza A by PCR: NEGATIVE
Influenza B by PCR: NEGATIVE
SARS Coronavirus 2 by RT PCR: NEGATIVE

## 2021-09-15 LAB — MAGNESIUM: Magnesium: 0.9 mg/dL — CL (ref 1.7–2.4)

## 2021-09-15 LAB — LACTIC ACID, PLASMA
Lactic Acid, Venous: 2.7 mmol/L (ref 0.5–1.9)
Lactic Acid, Venous: 2.8 mmol/L (ref 0.5–1.9)
Lactic Acid, Venous: 1.6 mmol/L (ref 0.5–1.9)

## 2021-09-15 LAB — PROTIME-INR
INR: 1.2 (ref 0.8–1.2)
Prothrombin Time: 15.5 seconds — ABNORMAL HIGH (ref 11.4–15.2)

## 2021-09-15 LAB — DIGOXIN LEVEL: Digoxin Level: <0.2 ng/mL — ABNORMAL LOW (ref 0.8–2.0)

## 2021-09-15 LAB — APTT: aPTT: 26 seconds (ref 24–36)

## 2021-09-15 MED ORDER — SODIUM CHLORIDE 0.9 % IV SOLN
2.0000 g | INTRAVENOUS | Status: DC
  Administered 2021-09-15 – 2021-09-18 (×4): 2 g via INTRAVENOUS
  Filled 2021-09-15 (×4): qty 20

## 2021-09-15 MED ORDER — DILTIAZEM HCL ER COATED BEADS 180 MG PO CP24
180.0000 mg | ORAL_CAPSULE | Freq: Every day | ORAL | Status: DC
  Administered 2021-09-15 – 2021-09-18 (×3): 180 mg via ORAL
  Filled 2021-09-15 (×3): qty 1

## 2021-09-15 MED ORDER — ACETAMINOPHEN 650 MG RE SUPP: 650.0000 mg | Freq: Four times a day (QID) | RECTAL | Status: DC | PRN

## 2021-09-15 MED ORDER — LACTATED RINGERS IV BOLUS
250.0000 mL | Freq: Once | INTRAVENOUS | Status: AC
  Administered 2021-09-15: 250 mL via INTRAVENOUS

## 2021-09-15 MED ORDER — MOMETASONE FURO-FORMOTEROL FUM 200-5 MCG/ACT IN AERO
2.0000 | INHALATION_SPRAY | Freq: Two times a day (BID) | RESPIRATORY_TRACT | Status: DC
  Administered 2021-09-15 – 2021-09-18 (×6): 2 via RESPIRATORY_TRACT
  Filled 2021-09-15: qty 8.8

## 2021-09-15 MED ORDER — ONDANSETRON HCL 4 MG PO TABS: 4.0000 mg | ORAL_TABLET | Freq: Four times a day (QID) | ORAL | Status: DC | PRN

## 2021-09-15 MED ORDER — URITRAX PO POWD: 1.0000 | Freq: Every day | ORAL | Status: DC

## 2021-09-15 MED ORDER — SODIUM CHLORIDE 0.9 % IV SOLN
500.0000 mg | INTRAVENOUS | Status: DC
Start: 2021-09-15 — End: 2021-09-16
  Administered 2021-09-15 – 2021-09-16 (×2): 500 mg via INTRAVENOUS
  Filled 2021-09-15 (×2): qty 5

## 2021-09-15 MED ORDER — RESVERATROL 250 MG PO CAPS: 1.0000 | ORAL_CAPSULE | Freq: Two times a day (BID) | ORAL | Status: DC

## 2021-09-15 MED ORDER — ONDANSETRON HCL 4 MG/2ML IJ SOLN: 4.0000 mg | Freq: Four times a day (QID) | INTRAMUSCULAR | Status: DC | PRN

## 2021-09-15 MED ORDER — MAGNESIUM SULFATE 2 GM/50ML IV SOLN
2.0000 g | Freq: Once | INTRAVENOUS | Status: AC
  Administered 2021-09-15: 2 g via INTRAVENOUS
  Filled 2021-09-15: qty 50

## 2021-09-15 MED ORDER — LACTATED RINGERS IV SOLN: INTRAVENOUS | Status: AC

## 2021-09-15 MED ORDER — SENNA 8.6 MG PO TABS: 1.0000 | ORAL_TABLET | Freq: Every day | ORAL | Status: DC | PRN

## 2021-09-15 MED ORDER — LEVALBUTEROL HCL 0.63 MG/3ML IN NEBU
0.6300 mg | INHALATION_SOLUTION | Freq: Four times a day (QID) | RESPIRATORY_TRACT | Status: DC | PRN
Start: 2021-09-15 — End: 2021-09-18
  Administered 2021-09-18: 0.63 mg via RESPIRATORY_TRACT
  Filled 2021-09-15: qty 3

## 2021-09-15 MED ORDER — ALBUTEROL SULFATE HFA 108 (90 BASE) MCG/ACT IN AERS: 2.0000 | INHALATION_SPRAY | RESPIRATORY_TRACT | Status: DC | PRN

## 2021-09-15 MED ORDER — APIXABAN 5 MG PO TABS
5.0000 mg | ORAL_TABLET | Freq: Two times a day (BID) | ORAL | Status: DC
  Administered 2021-09-15 – 2021-09-18 (×7): 5 mg via ORAL
  Filled 2021-09-15 (×7): qty 1

## 2021-09-15 MED ORDER — MEMANTINE HCL 10 MG PO TABS
10.0000 mg | ORAL_TABLET | Freq: Two times a day (BID) | ORAL | Status: DC
  Administered 2021-09-15 – 2021-09-18 (×7): 10 mg via ORAL
  Filled 2021-09-15 (×7): qty 1

## 2021-09-15 MED ORDER — ARFORMOTEROL TARTRATE 15 MCG/2ML IN NEBU
15.0000 ug | INHALATION_SOLUTION | Freq: Two times a day (BID) | RESPIRATORY_TRACT | Status: DC
  Filled 2021-09-15: qty 2

## 2021-09-15 MED ORDER — LACTATED RINGERS IV BOLUS
1000.0000 mL | Freq: Once | INTRAVENOUS | Status: AC
  Administered 2021-09-15: 1000 mL via INTRAVENOUS

## 2021-09-15 MED ORDER — BUDESONIDE 0.5 MG/2ML IN SUSP
0.5000 mg | Freq: Two times a day (BID) | RESPIRATORY_TRACT | Status: DC
Start: 2021-09-15 — End: 2021-09-15
  Administered 2021-09-15: 0.5 mg via RESPIRATORY_TRACT
  Filled 2021-09-15: qty 2

## 2021-09-15 MED ORDER — ALBUTEROL SULFATE (2.5 MG/3ML) 0.083% IN NEBU: 2.5000 mg | INHALATION_SOLUTION | RESPIRATORY_TRACT | Status: DC | PRN

## 2021-09-15 MED ORDER — ACETAMINOPHEN 325 MG PO TABS
650.0000 mg | ORAL_TABLET | Freq: Four times a day (QID) | ORAL | Status: DC | PRN
  Administered 2021-09-16 – 2021-09-18 (×4): 650 mg via ORAL
  Filled 2021-09-15 (×4): qty 2

## 2021-09-15 MED ORDER — DILTIAZEM HCL ER BEADS 180 MG PO CP24
180.0000 mg | ORAL_CAPSULE | Freq: Every day | ORAL | Status: DC
  Filled 2021-09-15: qty 1

## 2021-09-15 NOTE — ED Triage Notes (Signed)
Pt from home BIB [facility] d/t productive dry cough, possibly started 4 days ago, hx of bronchitis, chronic rhinitis, and hx of dementia. Sats 91 % on RA per EMS, EMS stated persistent cough in route. Pt alert, airway patent, NAD noted.

## 2021-09-15 NOTE — H&P (Addendum)
History and Physical    Patient: Bridget [name] HBZ:[id number] DOB: [birth year] DOA: 09/15/2021 DOS: the patient was seen and examined on 09/15/2021 PCP: Haywood Pao, MD  Patient coming from: Home  Chief Complaint:  Chief Complaint  Patient presents with   Cough   HPI: Bridget [name] is a 88 y.o. female with medical history significant of aortic atherosclerosis, chronic atrial fibrillation, breast cancer, chronic bronchitis, bronchiectasis, CAD, nocturnal polyuria, history of other nonhemorrhagic stroke, thrombus of left atrial appendage, hyperlipidemia, idiopathic peripheral neuropathy, multiple lung nodules, osteoarthritis, dementia who was brought in from her facility via EMS due to productive cough for the past 4 days associated with fatigue and at least 2-day sputum with subtle hemoptysis per patient's daughter.  She is unable to provide further history.  Her O2 sats were 91% when EMS arrived.  ED course: Initial vital signs were temperature 100.4 F, pulse 100, respiration 17, BP 154/78 mmHg O2 sat 91% on room air.  However, O2 sat decreased to 89% on room air shortly after arrival and the patient became mildly tachypneic.  She was placed on oxygen via nasal cannula at 2 LPM.  Patient received 1000 mL of normal saline bolus, azithromycin and ceftriaxone.  Lab work: CBC is her white count 9.3, hemoglobin 13.7 g/dL with an MCV of 102.9 fL and platelets 181.  PT 15.5, INR 1.2 and PTT 26.  CMP with a glucose of 160 and total bilirubin of 1.4 mg/dL.  The rest of the CMP values were normal.  Coronavirus and influenza PCR were negative.  Lactic acid was 2.7 and then 2.8 mmol/L.  Imaging: A portable 1 view chest radiograph showed left upper lobe pneumonia along with chronic cardiac enlargement.  There was also thoracolumbar scoliosis and degenerative disc disease.   Review of Systems: As mentioned in the history of present illness. All other systems reviewed and are negative. Past Medical  History:  Diagnosis Date   Atherosclerosis of aorta ([place])    Atrial fibrillation ([place])    Breast cancer ([place])    Chronic Bronchitis    Coronary atherosclerosis due to calcified coronary lesion    Nocturnal polyuria    Stroke ([place])    Thrombus of left atrial appendage    Past Surgical History:  Procedure Laterality Date   BREAST LUMPECTOMY Left 2001   FEMUR FRACTURE SURGERY Right 2019   RIGHT - 2015   TOTAL ABDOMINAL HYSTERECTOMY  1967   Social History:  reports that she quit smoking about 73 years ago. Her smoking use included cigarettes. She has a 10.50 pack-year smoking history. She has never used smokeless tobacco. She reports that she does not drink alcohol and does not use drugs.  Allergies  Allergen Reactions   Erythromycin Other (See Comments)    Unknown, but "all the mycins bother me"   Oxycodone Other (See Comments)    confusion   Tramadol Other (See Comments)    confusion    Family History  Problem Relation Age of Onset   Other Sister        TRAUMA TO HEAD AFTER A FALL    Prior to Admission medications   Medication Sig Start Date End Date Taking? Authorizing Provider  acetaminophen (TYLENOL) 500 MG tablet Take 1 tablet by mouth daily.    [provider]  ADVAIR [facility] 101-75 MCG/ACT inhaler Inhale 2 puffs into the lungs 2 (two) times daily. 03/24/21   [provider]  albuterol (VENTOLIN HFA) 108 (90 Base) MCG/ACT inhaler 1-2  PUFFS AS NEEDED FOR SHORTNESS OF BREATH, WHEEZING, COUGH URGENCY INHALATION EVERY 6 HRS    [provider]  apixaban (ELIQUIS) 5 MG TABS tablet Take 1 tablet (5 mg total) by mouth 2 (two) times daily. 09/20/20 04/23/21  Tolia, Sunit, DO  AZO D-MANNOSE 500 MG CAPS  11/20/20   [provider]  Besifloxacin HCl (BESIVANCE) 0.6 % SUSP INSTILL ONE DROP INTO BOTH EYES 4 TIMES A DAY FOR TWO DAYS AFTER EACH MONTHLY EYE INJECTION.    [provider]  budesonide (PULMICORT) 0.5 MG/2ML nebulizer solution TAKE 2  MLS (0.5 MG TOTAL) BY NEBULIZATION DAILY.    [provider]  co-enzyme Q-10 30 MG capsule Take 30 mg by mouth 3 (three) times daily.    [provider]  Cyanocobalamin (B-12) 1000 MCG TABS Take 1 tablet by mouth daily. 01/27/18   [provider]  D-5000 125 MCG (5000 UT) TABS  11/20/20   [provider]  denosumab (PROLIA) 60 MG/ML SOSY injection Inject 60 mg into the skin See admin instructions. Every 6 months 01/27/18   [provider]  diclofenac Sodium (VOLTAREN) 1 % GEL Apply topically 4 (four) times daily.    [provider]  digoxin (LANOXIN) 0.125 MG tablet Take 0.5 tablets by mouth daily.    [provider]  diltiazem (TIAZAC) 180 MG 24 hr capsule Take 180 mg by mouth daily.    [provider]  docusate sodium (COLACE) 100 MG capsule 100 mg daily as needed. 01/27/18   [provider]  doxycycline (VIBRA-TABS) 100 MG tablet Take 1 tablet by mouth 2 (two) times daily.    [provider]  escitalopram (LEXAPRO) 10 MG tablet Take 1 tablet by mouth daily.    [provider]  formoterol (PERFOROMIST) 20 MCG/2ML nebulizer solution TAKE 2 MLS (20 MCG TOTAL) BY NEBULIZATION 2 (TWO) TIMES DAILY.    [provider]  furosemide (LASIX) 20 MG tablet TAKE 1 TABLET (20 MG TOTAL) BY MOUTH DAILY AS NEEDED FOR EDEMA.    [provider]  ipratropium (ATROVENT) 0.06 % nasal spray USE 1 2 SPRAYS IN EACH NOSTRIL BEFORE MEALS AS NEEDED    [provider]  ketoconazole (NIZORAL) 2 % shampoo See admin instructions.    [provider]  ketorolac (ACULAR) 0.5 % ophthalmic solution INSTILL 1 DROP IN THE LEFT EYE 3 TIMES A DAY FOR 5 DAYS THEN DISCONTINUE    [provider]  Lactobacillus (ACIDOPHILUS PROBIOTIC) 100 MG CAPS Take 1 capsule by mouth daily. 01/27/18   [provider]  memantine (NAMENDA) 10 MG tablet Take 1 tablet by mouth 2 (two) times daily.    [provider]  molnupiravir EUA (LAGEVRIO) 200 MG CAPS capsule     [provider]  Multiple Vitamins-Minerals (PRESERVISION AREDS 2) CAPS Take 1 capsule by mouth in the morning and at bedtime.    [provider]  nitrofurantoin (MACRODANTIN) 100 MG capsule Take 1 capsule by mouth 2 (two) times daily.    [provider]  NON FORMULARY melatonin cbd    [provider]  Omega-3 Fatty Acids (OMEGA-3 FISH OIL PO) Take 1 tablet by mouth daily at 12 noon.    [provider]  predniSONE (STERAPRED UNI-PAK 21 TAB) 5 MG (21) TBPK tablet Take by mouth. 06/12/21   [provider]  Resveratrol 250 MG CAPS Take by mouth.    [provider]  senna (SENOKOT) 8.6 MG TABS tablet SMARTSIG:1 By  Mouth 11/20/20   [provider]    Physical Exam: Vitals:   09/15/21 0557 09/15/21 0600 09/15/21 0700 09/15/21 0716  BP:  (!) 136/102 130/68   Pulse:  96 99   Resp:  17 19   Temp: 98.2 F (36.8 C)   98.6 F (37 C)  TempSrc: Oral   Oral  SpO2:  96% 95%   Weight:      Height:       Physical Exam Vitals and nursing note reviewed.  Constitutional:      General: She is awake. She is not in acute distress.    Appearance: Normal appearance. She is not diaphoretic.     Interventions: Nasal cannula in place.  HENT:     Head: Normocephalic.     Nose: No rhinorrhea.     Mouth/Throat:     Mouth: Mucous membranes are dry.  Eyes:     General: No scleral icterus.    Pupils: Pupils are equal, round, and reactive to light.  Neck:     Vascular: No JVD.  Cardiovascular:     Rate and Rhythm: Tachycardia present. Rhythm irregularly irregular.     Heart sounds: S1 normal and S2 normal.  Pulmonary:     Effort: Pulmonary effort is normal.     Breath sounds: Examination of the left-upper field reveals rales. Rales present. No wheezing or rhonchi.  Abdominal:     General: Bowel sounds are normal. There is no distension.     Palpations: Abdomen is  soft.     Tenderness: There is no abdominal tenderness.  Musculoskeletal:     Cervical back: Neck supple.     Right lower leg: No edema.     Left lower leg: No edema.  Skin:    General: Skin is warm and dry.  Neurological:     General: No focal deficit present.     Mental Status: She is alert. Mental status is at baseline.  Psychiatric:        Mood and Affect: Mood normal.    Data Reviewed:  There are no new results to review at this time.  Assessment and Plan: Principal Problem:   Sepsis POA ([place]) In the setting of:   Pneumonia Admit to MedSurg/inpatient. Continue supplemental oxygen. Scheduled and as needed bronchodilators. Continue ceftriaxone 1 g IVPB daily. Continue azithromycin 500 mg IVPB daily. Check strep pneumoniae urinary antigen. Check sputum Gram stain, culture and sensitivity. Follow-up blood culture and sensitivity. Follow-up CBC and chemistry in the morning. LR 250 mL bolus followed by 50 mL/h x 15 hours. Follow-up lactic acid level.  Active Problems:   Bronchiectasis without complication ([place]) Continue pneumonia treatment as above. Continue Brovana and Pulmicort. Xopenex as needed every 6 hours.    Chronic atrial fibrillation with RVR (HCC) CHA2DS2-VASc Score of at least 7. Continue cardiac telemetry. Continue diltiazem 180 mg p.o. daily. Continue digoxin and after checking level. Continue apixaban 5 mg p.o. twice daily.    Thrombus of left atrial appendage On apixaban.    Hypomagnesemia Magnesium sulfate 2 g IVPB.    Hyperglycemia Check fasting glucose in AM. Check hemoglobin A1c.    Hyperbilirubinemia Mild elevation in the setting of sepsis. Follow LFTs tomorrow morning.    Dementia ([place]) Supportive care. Continue medications after med reconciliation done.    Advance Care Planning:   Code Status: Full Code   Consults:   Family Communication:   Severity of Illness: The appropriate patient status for this patient is  INPATIENT.  Inpatient status is judged to be reasonable and necessary in order to provide the required intensity of service to ensure the patient's safety. The patient's presenting symptoms, physical exam findings, and initial radiographic and laboratory data in the context of their chronic comorbidities is felt to place them at high risk for further clinical deterioration. Furthermore, it is not anticipated that the patient will be medically stable for discharge from the hospital within 2 midnights of admission.   * I certify that at the point of admission it is my clinical judgment that the patient will require inpatient hospital care spanning beyond 2 midnights from the point of admission due to high intensity of service, high risk for further deterioration and high frequency of surveillance required.*  Author: Reubin Milan, MD 09/15/2021 8:13 AM  For on call review [email or web address].   This document was prepared using Dragon voice recognition software and may contain some unintended transcription errors.

## 2021-09-15 NOTE — Sepsis Progress Note (Signed)
MD and bedside RN messaged asking to consider adding a 3rd lactic acid

## 2021-09-15 NOTE — ED Provider Notes (Signed)
[facility] Emergency Department Provider Note MRN:  [id number]  Arrival date & time: 09/15/21     Chief Complaint   Cough   History of Present Illness   Bridget [name] is a 88 y.o. year-old female with a history of A-fib, breast cancer, stroke, dementia presenting to the ED with chief complaint of cough.  Cough recently, febrile this evening.  Review of Systems  I was unable to obtain a full/accurate HPI, PMH, or ROS due to the patient's dementia.  Patient's Health History    Past Medical History:  Diagnosis Date   Atherosclerosis of aorta ([place])    Atrial fibrillation ([place])    Breast cancer ([place])    Chronic Bronchitis    Coronary atherosclerosis due to calcified coronary lesion    Stroke ([place])    Thrombus of left atrial appendage     Past Surgical History:  Procedure Laterality Date   BREAST LUMPECTOMY Left 2001   FEMUR FRACTURE SURGERY Right 2019   RIGHT - 2015   TOTAL ABDOMINAL HYSTERECTOMY  1967    Family History  Problem Relation Age of Onset   Other Sister        TRAUMA TO HEAD AFTER A FALL    Social History   Socioeconomic History   Marital status: Widowed    Spouse name: Not on file   Number of children: 3   Years of education: Not on file   Highest education level: Not on file  Occupational History   Not on file  Tobacco Use   Smoking status: Former    Packs/day: 1.50    Years: 7.00    Total pack years: 10.50    Types: Cigarettes    Quit date: 24    Years since quitting: 73.7   Smokeless tobacco: Never  Vaping Use   Vaping Use: Never used  Substance and Sexual Activity   Alcohol use: Never   Drug use: Never   Sexual activity: Not on file  Other Topics Concern   Not on file  Social History Narrative   Not on file   Social Determinants of Health   Financial Resource Strain: Not on file  Food Insecurity: Not on file  Transportation Needs: Not on file  Physical Activity: Not on file  Stress: Not on file   Social Connections: Not on file  Intimate Partner Violence: Not on file     Physical Exam   Vitals:   09/15/21 0556 09/15/21 0557  BP: 132/69   Pulse: 100   Resp: 19   Temp:  98.2 F (36.8 C)  SpO2: 96%     CONSTITUTIONAL: Chronically ill-appearing, NAD NEURO/PSYCH: Awake, alert, oriented to name, moves all extremities equally, answers questions EYES:  eyes equal and reactive ENT/NECK:  no LAD, no JVD CARDIO: Tachycardic rate, well-perfused, normal S1 and S2 PULM:  CTAB no wheezing or rhonchi GI/GU:  non-distended, non-tender MSK/SPINE:  No gross deformities, no edema SKIN:  no rash, atraumatic   *Additional and/or pertinent findings included in MDM below  Diagnostic and Interventional Summary    EKG Interpretation  Date/Time:  Sunday September 15 2021 04:35:47 EDT Ventricular Rate:  106 PR Interval:    QRS Duration: 140 QT Interval:  349 QTC Calculation: 464 R Axis:   12 Text Interpretation: Atrial fibrillation Right bundle branch block Confirmed by Gerlene Fee [phone]) on 09/15/2021 6:12:27 AM       Labs Reviewed  LACTIC ACID, PLASMA - Abnormal; Notable for the following components:  Result Value   Lactic Acid, Venous 2.7 (*)    All other components within normal limits  COMPREHENSIVE METABOLIC PANEL - Abnormal; Notable for the following components:   Glucose, Bld 160 (*)    Total Bilirubin 1.4 (*)    All other components within normal limits  CBC WITH DIFFERENTIAL/PLATELET - Abnormal; Notable for the following components:   MCV 102.9 (*)    Neutro Abs 8.3 (*)    Lymphs Abs 0.6 (*)    All other components within normal limits  PROTIME-INR - Abnormal; Notable for the following components:   Prothrombin Time 15.5 (*)    All other components within normal limits  RESP PANEL BY RT-PCR (FLU A&B, COVID) ARPGX2  CULTURE, BLOOD (ROUTINE X 2)  CULTURE, BLOOD (ROUTINE X 2)  URINE CULTURE  APTT  LACTIC ACID, PLASMA  URINALYSIS, ROUTINE W REFLEX  MICROSCOPIC    DG Chest Port 1 View  Final Result      Medications  cefTRIAXone (ROCEPHIN) 2 g in sodium chloride 0.9 % 100 mL IVPB (0 g Intravenous Stopped 09/15/21 0528)  azithromycin (ZITHROMAX) 500 mg in sodium chloride 0.9 % 250 mL IVPB (0 mg Intravenous Stopped 09/15/21 0607)  lactated ringers bolus 1,000 mL (0 mLs Intravenous Stopped 09/15/21 0546)     Procedures  /  Critical Care .Critical Care  Performed by: Maudie Flakes, MD Authorized by: Maudie Flakes, MD   Critical care provider statement:    Critical care time (minutes):  35   Critical care was necessary to treat or prevent imminent or life-threatening deterioration of the following conditions:  Sepsis   Critical care was time spent personally by me on the following activities:  Development of treatment plan with patient or surrogate, discussions with consultants, evaluation of patient's response to treatment, examination of patient, ordering and review of laboratory studies, ordering and review of radiographic studies, ordering and performing treatments and interventions, pulse oximetry, re-evaluation of patient's condition and review of old charts   ED Course and Medical Decision Making  Initial Impression and Ddx Suspicious for either pulmonary sepsis or viral illness with cough.  Patient arrives febrile and tachycardic, code sepsis initiated.  Also with saturations in the high 90s on room air, started on a few liters nasal cannula.  Past medical/surgical history that increases complexity of ED encounter: Chronic bronchitis, dementia, stroke  Interpretation of Diagnostics I personally reviewed the EKG and my interpretation is as follows: Bundle branch block similar to prior  Labs reveal elevated lactic acid, otherwise no significant blood count or electrolyte disturbance.  Chest x-ray showing evidence of pneumonia.  Patient Reassessment and Ultimate Disposition/Management     We will admit to hospital service  for pneumonia and possible sepsis.  Patient management required discussion with the following services or consulting groups:  Hospitalist Service  Complexity of Problems Addressed Acute illness or injury that poses threat of life of bodily function  Additional Data Reviewed and Analyzed Further history obtained from: EMS on arrival  Additional Factors Impacting ED Encounter Risk Consideration of hospitalization  Barth Kirks. [place] Small, [place] [email or web address]  Final Clinical Impressions(s) / ED Diagnoses     ICD-10-CM   1. Community acquired pneumonia, unspecified laterality  J18.9     2. Sepsis, due to unspecified organism, unspecified whether acute organ dysfunction present [facility])  A41.9       ED Discharge Orders     None  Discharge Instructions Discussed with and Provided to Patient:   Discharge Instructions   None      Maudie Flakes, MD 09/15/21 [phone]

## 2021-09-15 NOTE — ED Notes (Signed)
Pt has an assigned bed (1606), currently waiting on purpleman so care handoff may begin

## 2021-09-15 NOTE — ED Notes (Signed)
Pt placed on 2L [place] for supplemental oxygen d/t decrease in sats to 89% on RA

## 2021-09-15 NOTE — Progress Notes (Signed)
Pt being followed by [facility] for Sepsis protocol. 

## 2021-09-15 NOTE — Progress Notes (Signed)
PHARMACIST - PHYSICIAN ORDER COMMUNICATION  CONCERNING: P&T Medication Policy on Herbal Medications  DESCRIPTION:  This patient's order for:  resveratrol and Uritrax  has been noted.  This product(s) is classified as an "herbal" or natural product or supplement. Due to a lack of definitive safety studies or FDA approval, nonstandard manufacturing practices, plus the potential risk of unknown drug-drug interactions while on inpatient medications, the Pharmacy and Therapeutics Committee does not permit the use of "herbal" or natural products of this type within [facility].   ACTION TAKEN: The pharmacy department is unable to verify this order at this time and has discontinued the order. Please reevaluate patient's clinical condition at discharge and address if the herbal or natural product(s) should be resumed at that time.   Thank you for allowing pharmacy to be a part of this patient's care.  Royetta Asal, PharmD, BCPS Clinical Pharmacist [place] Please utilize Amion for appropriate phone number to reach the unit pharmacist ([place]) 09/15/2021 10:35 AM

## 2021-09-15 NOTE — ED Notes (Signed)
Receiving RN Adline Peals, has agreed to accept [facility] once pt has arrived to inpatient unit, all questions and concerns address.

## 2021-09-15 NOTE — ED Notes (Signed)
This RN attempted to call inpatient unit to give handoff report before change of shift, however unable to give report, no RN has been assigned at this time

## 2021-09-16 DIAGNOSIS — J189 Pneumonia, unspecified organism: Secondary | ICD-10-CM | POA: Diagnosis not present

## 2021-09-16 LAB — CBC WITH DIFFERENTIAL/PLATELET
Abs Immature Granulocytes: 0.06 10*3/uL (ref 0.00–0.07)
Basophils Absolute: 0 10*3/uL (ref 0.0–0.1)
Basophils Relative: 0 %
Eosinophils Absolute: 0 10*3/uL (ref 0.0–0.5)
Eosinophils Relative: 0 %
HCT: 36.9 % (ref 36.0–46.0)
Hemoglobin: 12 g/dL (ref 12.0–15.0)
Immature Granulocytes: 1 %
Lymphocytes Relative: 8 %
Lymphs Abs: 1 10*3/uL (ref 0.7–4.0)
MCH: 33.5 pg (ref 26.0–34.0)
MCHC: 32.5 g/dL (ref 30.0–36.0)
MCV: 103.1 fL — ABNORMAL HIGH (ref 80.0–100.0)
Monocytes Absolute: 0.8 10*3/uL (ref 0.1–1.0)
Monocytes Relative: 6 %
Neutro Abs: 9.8 10*3/uL — ABNORMAL HIGH (ref 1.7–7.7)
Neutrophils Relative %: 85 %
Platelets: 146 10*3/uL — ABNORMAL LOW (ref 150–400)
RBC: 3.58 MIL/uL — ABNORMAL LOW (ref 3.87–5.11)
RDW: 14.4 % (ref 11.5–15.5)
WBC: 11.6 10*3/uL — ABNORMAL HIGH (ref 4.0–10.5)
nRBC: 0 % (ref 0.0–0.2)

## 2021-09-16 LAB — COMPREHENSIVE METABOLIC PANEL
ALT: 22 U/L (ref 0–44)
AST: 19 U/L (ref 15–41)
Albumin: 2.8 g/dL — ABNORMAL LOW (ref 3.5–5.0)
Alkaline Phosphatase: 44 U/L (ref 38–126)
Anion gap: 6 (ref 5–15)
BUN: 11 mg/dL (ref 8–23)
CO2: 25 mmol/L (ref 22–32)
Calcium: 8 mg/dL — ABNORMAL LOW (ref 8.9–10.3)
Chloride: 110 mmol/L (ref 98–111)
Creatinine, Ser: 0.59 mg/dL (ref 0.44–1.00)
GFR, Estimated: >60 mL/min (ref >60)
Glucose, Bld: 117 mg/dL — ABNORMAL HIGH (ref 70–99)
Potassium: 3.5 mmol/L (ref 3.5–5.1)
Sodium: 141 mmol/L (ref 135–145)
Total Bilirubin: 1 mg/dL (ref 0.3–1.2)
Total Protein: 5.5 g/dL — ABNORMAL LOW (ref 6.5–8.1)

## 2021-09-16 LAB — MAGNESIUM: Magnesium: 2.2 mg/dL (ref 1.7–2.4)

## 2021-09-16 MED ORDER — AZITHROMYCIN 250 MG PO TABS
500.0000 mg | ORAL_TABLET | Freq: Every day | ORAL | Status: DC
  Administered 2021-09-17 – 2021-09-18 (×2): 500 mg via ORAL
  Filled 2021-09-16 (×2): qty 2

## 2021-09-16 NOTE — Progress Notes (Signed)
Mobility Specialist - Progress Note   09/16/21 1251  Mobility  Activity Ambulated with assistance in hallway  Level of Assistance Minimal assist, patient does 75% or more  Assistive Device Four wheel walker  Distance Ambulated (ft) 50 ft  Activity Response Tolerated well  $Mobility charge 1 Mobility   Pt received in bed and agreed for mobility. Some pain in hands and R Quadriceps, both were 4/10. Pt returned to bed with all needs met, family in room and RN notified of session.    Roderick Pee Mobility Specialist

## 2021-09-16 NOTE — TOC Initial Note (Signed)
Transition of Care [facility]) - Initial/Assessment Note    Patient Details  Name: Bridget [name] MRN: [id number] Date of Birth: [birth year]  Transition of Care [facility]) CM/SW Contact:    Leeroy Cha, RN Phone Number: 09/16/2021, 9:21 AM  Clinical Narrative:                  Transition of Care [facility]) Screening Note   Patient Details  Name: Bridget [name] Date of Birth: [birth year]   Transition of Care [facility]) CM/SW Contact:    Leeroy Cha, RN Phone Number: 09/16/2021, 9:21 AM    Transition of Care Department [facility]) has reviewed patient and no TOC needs have been identified at this time. We will continue to monitor patient advancement through interdisciplinary progression rounds. If new patient transition needs arise, please place a TOC consult.    Expected Discharge Plan: Home/Self Care Barriers to Discharge: Continued Medical Work up   Patient Goals and CMS Choice Patient states their goals for this hospitalization and ongoing recovery are:: to go home CMS Medicare.gov Compare Post Acute Care list provided to:: Patient Choice offered to / list presented to : Patient  Expected Discharge Plan and Services Expected Discharge Plan: Home/Self Care   Discharge Planning Services: CM Consult   Living arrangements for the past 2 months: Single Family Home                                      Prior Living Arrangements/Services Living arrangements for the past 2 months: Single Family Home Lives with:: Self Patient language and need for interpreter reviewed:: Yes Do you feel safe going back to the place where you live?: Yes            Criminal Activity/Legal Involvement Pertinent to Current Situation/Hospitalization: No - Comment as needed  Activities of Daily Living      Permission Sought/Granted                  Emotional Assessment Appearance:: Appears stated age Attitude/Demeanor/Rapport: Engaged Affect (typically observed): Calm Orientation: :  Oriented to Self, Oriented to Place, Oriented to  Time, Oriented to Situation Alcohol / Substance Use: Tobacco Use (quit 41 years ago) Psych Involvement: No (comment)  Admission diagnosis:  Pneumonia [J18.9] Community acquired pneumonia, unspecified laterality [J18.9] Sepsis, due to unspecified organism, unspecified whether acute organ dysfunction present [facility]) [A41.9] Patient Active Problem List   Diagnosis Date Noted   Pneumonia 09/15/2021   Sepsis ([place]) 09/15/2021   Hyperglycemia 09/15/2021   Hyperbilirubinemia 09/15/2021   Chronic atrial fibrillation with RVR ([place]) 09/15/2021   Dementia ([place]) 09/15/2021   Hypomagnesemia 09/15/2021   Annual physical exam 07/02/2021   Bilateral sensorineural hearing loss 07/02/2021   Elevated LDL cholesterol level 07/02/2021   Chronic atelectasis 04/11/2021   Knee pain, bilateral 03/28/2021   Idiopathic peripheral neuropathy 03/18/2021   Numbness of foot 03/18/2021   Pain in the coccyx 11/19/2020   Bronchiectasis without complication ([place]) 97/41/6384   Lung nodule, multiple 10/02/2020   Coronary atherosclerosis due to calcified coronary lesion    Thrombus of left atrial appendage    Osteoarthritis of knee 05/31/2019   Lumbar pain 10/06/2018   Osteoarthritis of left knee 09/15/2018   Pain in left knee 08/23/2018   Pain in right knee 08/23/2018   Osteoarthritis of right knee 08/09/2018   Chronic vasomotor rhinitis 08/06/2018   Chronic cystitis 01/30/2018   PCP:  Tisovec, Fransico Him, MD Pharmacy:   [id number] [place], [address]Phone: [phone]: [phone]- [address] [place]Phone: [phone]: [phone]    Social Determinants of Health (SDOH) Interventions    Readmission Risk Interventions   No data to display

## 2021-09-16 NOTE — Progress Notes (Signed)
PROGRESS NOTE    Bridget [name]  HQI:[id number] DOB: 19-May-1921 DOA: 09/15/2021 PCP: Haywood Pao, MD    Brief Narrative:  This 88 years old female with PMH significant for aortic atherosclerosis, chronic atrial fibrillation, breast cancer, chronic bronchitis, bronchiectasis, CAD, nocturnal polyuria, history of other nonhemorrhagic stroke, thrombus of left atrial appendage, hyperlipidemia, idiopathic peripheral neuropathy, multiple lung nodules, osteoarthritis, dementia who was brought in from her facility with complaints of productive cough for last 4 days associated with fatigue.  Her O2 saturation was 91% when EMS arrived.  Patient was febrile with a temp of 100.4,  O2 saturation has reduced to 89% on room air after arrival and she became tachypneic she was placed on oxygen and was started on antibiotics.  Chest x-ray shows left upper lobe pneumonia along with chronic cardiac enlargement.  Patient is admitted for community-acquired pneumonia,  requiring IV antibiotics.  Assessment & Plan:   Principal Problem:   Pneumonia Active Problems:   Thrombus of left atrial appendage   Bronchiectasis without complication (HCC)   Sepsis (HCC)   Hyperglycemia   Hyperbilirubinemia   Chronic atrial fibrillation with RVR (HCC)   Dementia (HCC)   Hypomagnesemia   Sepsis  / Community-acquired pneumonia: Patient presented with cough, fever, tachycardia, hypoxia  and Lactic acid 2.7 Chest x-ray shows left upper lobe pneumonia. Continue empiric antibiotics ceftriaxone and Zithromax. Check strep pneumo and Legionella urinary antigen Check sputum Gram stain culture and sensitivity Follow-up blood and urine cultures Lactic acid 2.7> improved with IV hydration  Bronchiectasis without complication: Continue antibiotics for pneumonia Continue Brovana and Pulmicort Continue Xopenex as needed every 6 hours  Chronic atrial fibrillation with RVR: Heart rate is now reasonably controlled Continue  Cardizem 180 mg daily Continue digoxin Continue Eliquis 5 mg twice daily.  Thrombus of left atrial appendage: Continue Eliquis  Hypomagnesemia: Replaced.  Continue to monitor  Hyperglycemia: Obtain hemoglobin A1c  Hyper bilirubinemia: Mild elevation could be in the setting of pneumonia Continue LFTs  Dementia: Continue supportive care.   Generalized weakness: PT and OT eval  DVT prophylaxis: Eliquis Code Status: Full code Family Communication: Daughter at bedside Disposition Plan:   Status is: Inpatient Remains inpatient appropriate because: Admitted for left upper lobe pneumonia requiring IV antibiotics.    Consultants:  None  Procedures: None Antimicrobials: Ceftriaxone and Zithromax  Subjective: Patient was seen and examined at bedside.  Overnight events noted.  Patient reports feeling improved. Daughter at bedside stated that she is much better,  still having some weakness  Objective: Vitals:   09/15/21 2332 09/15/21 2333 09/16/21 0541 09/16/21 1220  BP: (!) 130/56  138/83 128/74  Pulse: (!) 106 (!) 106 (!) 103 92  Resp: '19  17 20  '$ Temp: 99 F (37.2 C)  (!) 97.5 F (36.4 C)   TempSrc: Oral  Oral   SpO2: (!) 89% 91% 93% 95%  Weight:      Height:        Intake/Output Summary (Last 24 hours) at 09/16/2021 1414 Last data filed at 09/16/2021 0606 Gross per 24 hour  Intake 606 ml  Output 500 ml  Net 106 ml   Filed Weights   09/15/21 0422  Weight: 60.4 kg    Examination:  General exam: Appears comfortable, not in any acute distress.  Deconditioned Respiratory system: CTA bilaterally, no wheezing, no crackles, normal respiratory effort Cardiovascular system: S1 & S2 heard, regular rate and rhythm, no murmur. Gastrointestinal system: Abdomen is soft, non tender, non distended, BS+ Central  nervous system: Alert and oriented x 1. No focal neurological deficits. Extremities: No edema, no cyanosis, no clubbing Skin: No rashes, lesions or  ulcers Psychiatry: Judgement and insight appear normal. Mood & affect appropriate.     Data Reviewed: I have personally reviewed following labs and imaging studies  CBC: Recent Labs  Lab 09/15/21 0440 09/16/21 0705  WBC 9.3 11.6*  NEUTROABS 8.3* 9.8*  HGB 13.7 12.0  HCT 42.3 36.9  MCV 102.9* 103.1*  PLT 181 161*   Basic Metabolic Panel: Recent Labs  Lab 09/15/21 0440 09/15/21 0858 09/15/21 1047 09/16/21 0705  NA 142  --   --  141  K 3.8  --   --  3.5  CL 103  --   --  110  CO2 28  --   --  25  GLUCOSE 160*  --   --  117*  BUN 15  --   --  11  CREATININE 0.77  --   --  0.59  CALCIUM 9.3  --   --  8.0*  MG  --  0.9*  --  2.2  PHOS  --   --  2.4*  --    GFR: Estimated Creatinine Clearance: 38.3 mL/min (by C-G formula based on SCr of 0.59 mg/dL). Liver Function Tests: Recent Labs  Lab 09/15/21 0440 09/16/21 0705  AST 24 19  ALT 25 22  ALKPHOS 50 44  BILITOT 1.4* 1.0  PROT 6.8 5.5*  ALBUMIN 4.1 2.8*   No results for input(s): "LIPASE", "AMYLASE" in the last 168 hours. No results for input(s): "AMMONIA" in the last 168 hours. Coagulation Profile: Recent Labs  Lab 09/15/21 0440  INR 1.2   Cardiac Enzymes: No results for input(s): "CKTOTAL", "CKMB", "CKMBINDEX", "TROPONINI" in the last 168 hours. BNP (last 3 results) No results for input(s): "PROBNP" in the last 8760 hours. HbA1C: No results for input(s): "HGBA1C" in the last 72 hours. CBG: No results for input(s): "GLUCAP" in the last 168 hours. Lipid Profile: No results for input(s): "CHOL", "HDL", "LDLCALC", "TRIG", "CHOLHDL", "LDLDIRECT" in the last 72 hours. Thyroid Function Tests: No results for input(s): "TSH", "T4TOTAL", "FREET4", "T3FREE", "THYROIDAB" in the last 72 hours. Anemia Panel: No results for input(s): "VITAMINB12", "FOLATE", "FERRITIN", "TIBC", "IRON", "RETICCTPCT" in the last 72 hours. Sepsis Labs: Recent Labs  Lab 09/15/21 0440 09/15/21 0637 09/15/21 0858  LATICACIDVEN 2.7*  2.8* 1.6    Recent Results (from the past 240 hour(s))  Blood Culture (routine x 2)     Status: None (Preliminary result)   Collection Time: 09/15/21  4:32 AM   Specimen: Left Antecubital; Blood  Result Value Ref Range Status   Specimen Description   Final    LEFT ANTECUBITAL BLOOD Performed at [facility], [address] [place], [place] [address]    Special Requests   Final    BOTTLES DRAWN AEROBIC AND ANAEROBIC Blood Culture results may not be optimal due to an excessive volume of blood received in culture bottles Performed at [facility] [address] [place], [place] [address]    Culture   Final    NO GROWTH < 24 HOURS Performed at [facility] [address], [place], [place] [address]    Report Status PENDING  Incomplete  Blood Culture (routine x 2)     Status: None (Preliminary result)   Collection Time: 09/15/21  4:44 AM   Specimen: BLOOD LEFT FOREARM  Result Value Ref Range Status  Specimen Description   Final    BLOOD LEFT FOREARM Performed at [facility] [address], [place], [place] [address]    Special Requests   Final    BOTTLES DRAWN AEROBIC AND ANAEROBIC Blood Culture adequate volume Performed at [facility] [address], [place], [place] [address]    Culture   Final    NO GROWTH < 24 HOURS Performed at [facility] [address], [place], [place] [address]    Report Status PENDING  Incomplete  Resp Panel by RT-PCR (Flu A&B, Covid) Anterior Nasal Swab     Status: None   Collection Time: 09/15/21  5:02 AM   Specimen: Anterior Nasal Swab  Result Value Ref Range Status   SARS Coronavirus 2 by RT PCR NEGATIVE NEGATIVE Final    Comment: (NOTE) SARS-CoV-2 target nucleic acids are NOT DETECTED.  The SARS-CoV-2 RNA is generally detectable in upper respiratory specimens during the acute phase of infection. The lowest concentration of SARS-CoV-2 viral copies this assay  can detect is 138 copies/mL. A negative result does not preclude SARS-Cov-2 infection and should not be used as the sole basis for treatment or other patient management decisions. A negative result may occur with  improper specimen collection/handling, submission of specimen other than nasopharyngeal swab, presence of viral mutation(s) within the areas targeted by this assay, and inadequate number of viral copies(<138 copies/mL). A negative result must be combined with clinical observations, patient history, and epidemiological information. The expected result is Negative.  Fact Sheet for Patients:  EntrepreneurPulse.com.au  Fact Sheet for Healthcare Providers:  IncredibleEmployment.be  This test is no t yet approved or cleared by the [place] FDA and  has been authorized for detection and/or diagnosis of SARS-CoV-2 by FDA under an Emergency Use Authorization (EUA). This EUA will remain  in effect (meaning this test can be used) for the duration of the COVID-19 declaration under Section 564(b)(1) of the Act, 21 U.S.C.section 360bbb-3(b)(1), unless the authorization is terminated  or revoked sooner.       Influenza A by PCR NEGATIVE NEGATIVE Final   Influenza B by PCR NEGATIVE NEGATIVE Final    Comment: (NOTE) The Xpert Xpress SARS-CoV-2/FLU/RSV plus assay is intended as an aid in the diagnosis of influenza from Nasopharyngeal swab specimens and should not be used as a sole basis for treatment. Nasal washings and aspirates are unacceptable for Xpert Xpress SARS-CoV-2/FLU/RSV testing.  Fact Sheet for Patients: EntrepreneurPulse.com.au  Fact Sheet for Healthcare Providers: IncredibleEmployment.be  This test is not yet approved or cleared by the [place] FDA and has been authorized for detection and/or diagnosis of SARS-CoV-2 by FDA under an Emergency Use Authorization (EUA). This EUA will  remain in effect (meaning this test can be used) for the duration of the COVID-19 declaration under Section 564(b)(1) of the Act, 21 U.S.C. section 360bbb-3(b)(1), unless the authorization is terminated or revoked.  Performed at [facility], [place] [address] [place], [place] [address]     Radiology Studies: [facility] Chest Port 1 View  Result Date: 09/15/2021 CLINICAL DATA:  Sepsis.  Cough.  History of bronchitis and dementia. EXAM: PORTABLE CHEST 1 VIEW COMPARISON:  CT chest 06/29/2021 FINDINGS: Cardiac enlargement is again noted and appears similar to the previous exam. No signs of pleural effusion. Asymmetric airspace opacification within the left upper lobe is identified compatible with pneumonia. Thoracolumbar scoliosis and degenerative disc disease. IMPRESSION: 1. Left upper lobe pneumonia. Followup PA and lateral chest  X-ray is recommended in 3-4 weeks following trial of antibiotic therapy to ensure resolution and exclude underlying malignancy. 2. Chronic cardiac enlargement. Electronically Signed   By: Kerby Moors M.D.   On: 09/15/2021 05:40    Scheduled Meds:  apixaban  5 mg Oral BID   [START ON 09/17/2021] azithromycin  500 mg Oral Daily   diltiazem  180 mg Oral Daily   memantine  10 mg Oral BID   mometasone-formoterol  2 puff Inhalation BID   Continuous Infusions:  cefTRIAXone (ROCEPHIN)  IV 2 g (09/16/21 0606)     LOS: 1 day    Time spent: 50 mins    Merisa Julio, MD [facility]   If 7PM-7AM, please contact night-coverage

## 2021-09-17 ENCOUNTER — Inpatient Hospital Stay (HOSPITAL_COMMUNITY): Payer: Medicare Other

## 2021-09-17 DIAGNOSIS — J189 Pneumonia, unspecified organism: Secondary | ICD-10-CM | POA: Diagnosis not present

## 2021-09-17 LAB — CBC
HCT: 36.6 % (ref 36.0–46.0)
Hemoglobin: 12 g/dL (ref 12.0–15.0)
MCH: 33.9 pg (ref 26.0–34.0)
MCHC: 32.8 g/dL (ref 30.0–36.0)
MCV: 103.4 fL — ABNORMAL HIGH (ref 80.0–100.0)
Platelets: 149 10*3/uL — ABNORMAL LOW (ref 150–400)
RBC: 3.54 MIL/uL — ABNORMAL LOW (ref 3.87–5.11)
RDW: 14 % (ref 11.5–15.5)
WBC: 8.7 10*3/uL (ref 4.0–10.5)
nRBC: 0 % (ref 0.0–0.2)

## 2021-09-17 LAB — PHOSPHORUS: Phosphorus: 1.9 mg/dL — ABNORMAL LOW (ref 2.5–4.6)

## 2021-09-17 LAB — URINALYSIS, ROUTINE W REFLEX MICROSCOPIC
Bilirubin Urine: NEGATIVE
Glucose, UA: NEGATIVE mg/dL
Hgb urine dipstick: NEGATIVE
Ketones, ur: NEGATIVE mg/dL
Leukocytes,Ua: NEGATIVE
Nitrite: NEGATIVE
Protein, ur: NEGATIVE mg/dL
Specific Gravity, Urine: 1.003 — ABNORMAL LOW (ref 1.005–1.030)
pH: 7 (ref 5.0–8.0)

## 2021-09-17 LAB — STREP PNEUMONIAE URINARY ANTIGEN: Strep Pneumo Urinary Antigen: NEGATIVE

## 2021-09-17 LAB — BASIC METABOLIC PANEL
Anion gap: 6 (ref 5–15)
BUN: 9 mg/dL (ref 8–23)
CO2: 26 mmol/L (ref 22–32)
Calcium: 8.2 mg/dL — ABNORMAL LOW (ref 8.9–10.3)
Chloride: 111 mmol/L (ref 98–111)
Creatinine, Ser: 0.62 mg/dL (ref 0.44–1.00)
GFR, Estimated: >60 mL/min (ref >60)
Glucose, Bld: 123 mg/dL — ABNORMAL HIGH (ref 70–99)
Potassium: 3.3 mmol/L — ABNORMAL LOW (ref 3.5–5.1)
Sodium: 143 mmol/L (ref 135–145)

## 2021-09-17 LAB — MAGNESIUM: Magnesium: 2.2 mg/dL (ref 1.7–2.4)

## 2021-09-17 MED ORDER — POTASSIUM PHOSPHATES 15 MMOLE/5ML IV SOLN
30.0000 mmol | Freq: Once | INTRAVENOUS | Status: AC
  Administered 2021-09-17: 30 mmol via INTRAVENOUS
  Filled 2021-09-17: qty 10

## 2021-09-17 NOTE — Progress Notes (Signed)
PROGRESS NOTE    Bridget [name]  QTM:[id number] DOB: 14-May-1921 DOA: 09/15/2021 PCP: Haywood Pao, MD    Brief Narrative:  This 88 years old female with PMH significant for aortic atherosclerosis, chronic atrial fibrillation, breast cancer, chronic bronchitis, bronchiectasis, CAD, nocturnal polyuria, history of other nonhemorrhagic stroke, thrombus of left atrial appendage, hyperlipidemia, idiopathic peripheral neuropathy, multiple lung nodules, osteoarthritis, dementia who was brought in from her facility with complaints of productive cough for last 4 days associated with fatigue.  Her O2 saturation was 91% when EMS arrived.  Patient was febrile with a temp of 100.4,  O2 saturation has reduced to 89% on room air after arrival and she became tachypneic. she was placed on oxygen and was started on antibiotics.  Chest x-ray shows left upper lobe pneumonia along with chronic cardiac enlargement.  Patient is admitted for community-acquired pneumonia,  requiring IV antibiotics.  Assessment & Plan:   Principal Problem:   Pneumonia Active Problems:   Thrombus of left atrial appendage   Bronchiectasis without complication (HCC)   Sepsis (HCC)   Hyperglycemia   Hyperbilirubinemia   Chronic atrial fibrillation with RVR (HCC)   Dementia (HCC)   Hypomagnesemia   Sepsis  / Community-acquired pneumonia: Patient presented with cough, fever, tachycardia, hypoxia  and Lactic acid 2.7 Chest x-ray shows left upper lobe pneumonia. Continue empiric antibiotics ceftriaxone and Zithromax. Strep Pneumo: negative,  Legionella urinary antigen pending Check sputum Gram stain culture and sensitivity Blood and urine cultures no growth so far Lactic acid 2.7> improved with IV hydration. Sepsis physiology improving  Bronchiectasis without complication: Continue antibiotics for pneumonia Continue Brovana and Pulmicort Continue Xopenex as needed every 6 hours  Chronic atrial fibrillation with RVR: Heart  rate is now reasonably controlled. Continue Cardizem CD 180 mg daily Hold digoxin Continue Eliquis 5 mg twice daily.  Thrombus of left atrial appendage: Continue Eliquis  Hypomagnesemia: Replaced.  Continue to monitor.  Hyperglycemia: Obtain hemoglobin A1c  Hyper bilirubinemia: Mild elevation could be in the setting of pneumonia. Continue LFTs  Dementia: Continue supportive care.   Generalized weakness: PT and OT eval  DVT prophylaxis: Eliquis Code Status: Full code Family Communication: Daughter at bedside Disposition Plan:   Status is: Inpatient Remains inpatient appropriate because: Admitted for left upper lobe pneumonia requiring IV antibiotics.   Anticipated discharge home in 1 to 2 days  Consultants:  None  Procedures: None Antimicrobials: Ceftriaxone and Zithromax  Subjective: Patient was seen and examined at bedside.  Overnight events noted.  Patient reports feeling much improved.  Daughter is at bedside,  states she has ambulated with support and did well.  Patient reports still feels weak.  Objective: Vitals:   09/17/21 0546 09/17/21 0857 09/17/21 1010 09/17/21 1013  BP: (!) 132/49  (!) 103/56 (!) 104/53  Pulse: 77   90  Resp: 17     Temp: 98.6 F (37 C)     TempSrc: Oral     SpO2: 92% 94%    Weight:      Height:        Intake/Output Summary (Last 24 hours) at 09/17/2021 1204 Last data filed at 09/17/2021 0844 Gross per 24 hour  Intake 490 ml  Output 800 ml  Net -310 ml   Filed Weights   09/15/21 0422  Weight: 60.4 kg    Examination:  General exam: Appears comfortable, not in any acute distress.  Deconditioned Respiratory system: CTA bilaterally, no accessory muscle use, normal respiratory effort, RR 15 Cardiovascular system: S1 &  S2 heard, regular rate and rhythm, no murmur. Gastrointestinal system: Abdomen is soft, non tender, non distended, BS+ Central nervous system: Alert and oriented x 2. No focal neurological  deficits. Extremities: No edema, no cyanosis, no clubbing Skin: No rashes, lesions or ulcers Psychiatry: Judgement and insight appear normal. Mood & affect appropriate.     Data Reviewed: I have personally reviewed following labs and imaging studies  CBC: Recent Labs  Lab 09/15/21 0440 09/16/21 0705 09/17/21 0601  WBC 9.3 11.6* 8.7  NEUTROABS 8.3* 9.8*  --   HGB 13.7 12.0 12.0  HCT 42.3 36.9 36.6  MCV 102.9* 103.1* 103.4*  PLT 181 146* 016*   Basic Metabolic Panel: Recent Labs  Lab 09/15/21 0440 09/15/21 0858 09/15/21 1047 09/16/21 0705 09/17/21 0601  NA 142  --   --  141 143  K 3.8  --   --  3.5 3.3*  CL 103  --   --  110 111  CO2 28  --   --  25 26  GLUCOSE 160*  --   --  117* 123*  BUN 15  --   --  11 9  CREATININE 0.77  --   --  0.59 0.62  CALCIUM 9.3  --   --  8.0* 8.2*  MG  --  0.9*  --  2.2 2.2  PHOS  --   --  2.4*  --  1.9*   GFR: Estimated Creatinine Clearance: 38.3 mL/min (by C-G formula based on SCr of 0.62 mg/dL). Liver Function Tests: Recent Labs  Lab 09/15/21 0440 09/16/21 0705  AST 24 19  ALT 25 22  ALKPHOS 50 44  BILITOT 1.4* 1.0  PROT 6.8 5.5*  ALBUMIN 4.1 2.8*   No results for input(s): "LIPASE", "AMYLASE" in the last 168 hours. No results for input(s): "AMMONIA" in the last 168 hours. Coagulation Profile: Recent Labs  Lab 09/15/21 0440  INR 1.2   Cardiac Enzymes: No results for input(s): "CKTOTAL", "CKMB", "CKMBINDEX", "TROPONINI" in the last 168 hours. BNP (last 3 results) No results for input(s): "PROBNP" in the last 8760 hours. HbA1C: No results for input(s): "HGBA1C" in the last 72 hours. CBG: No results for input(s): "GLUCAP" in the last 168 hours. Lipid Profile: No results for input(s): "CHOL", "HDL", "LDLCALC", "TRIG", "CHOLHDL", "LDLDIRECT" in the last 72 hours. Thyroid Function Tests: No results for input(s): "TSH", "T4TOTAL", "FREET4", "T3FREE", "THYROIDAB" in the last 72 hours. Anemia Panel: No results for  input(s): "VITAMINB12", "FOLATE", "FERRITIN", "TIBC", "IRON", "RETICCTPCT" in the last 72 hours. Sepsis Labs: Recent Labs  Lab 09/15/21 0440 09/15/21 0637 09/15/21 0858  LATICACIDVEN 2.7* 2.8* 1.6    Recent Results (from the past 240 hour(s))  Blood Culture (routine x 2)     Status: None (Preliminary result)   Collection Time: 09/15/21  4:32 AM   Specimen: Left Antecubital; Blood  Result Value Ref Range Status   Specimen Description   Final    LEFT ANTECUBITAL BLOOD Performed at [facility], [address] [place], [place] [address]    Special Requests   Final    BOTTLES DRAWN AEROBIC AND ANAEROBIC Blood Culture results may not be optimal due to an excessive volume of blood received in culture bottles Performed at [facility] [address], [place], [place] [address]    Culture   Final    NO GROWTH 2 DAYS Performed at [facility] [address] [place], [place] [address]    Report  Status PENDING  Incomplete  Blood Culture (routine x 2)     Status: None (Preliminary result)   Collection Time: 09/15/21  4:44 AM   Specimen: BLOOD LEFT FOREARM  Result Value Ref Range Status   Specimen Description   Final    BLOOD LEFT FOREARM Performed at [facility] [address] [place], [place] [address]    Special Requests   Final    BOTTLES DRAWN AEROBIC AND ANAEROBIC Blood Culture adequate volume Performed at [facility] [address], [place], [place] [address]    Culture   Final    NO GROWTH 2 DAYS Performed at [facility] [address], [place], [place] [address]    Report Status PENDING  Incomplete  Resp Panel by RT-PCR (Flu A&B, Covid) Anterior Nasal Swab     Status: None   Collection Time: 09/15/21  5:02 AM   Specimen: Anterior Nasal Swab  Result Value Ref Range Status   SARS Coronavirus 2 by RT PCR NEGATIVE NEGATIVE Final    Comment: (NOTE) SARS-CoV-2 target nucleic acids are NOT  DETECTED.  The SARS-CoV-2 RNA is generally detectable in upper respiratory specimens during the acute phase of infection. The lowest concentration of SARS-CoV-2 viral copies this assay can detect is 138 copies/mL. A negative result does not preclude SARS-Cov-2 infection and should not be used as the sole basis for treatment or other patient management decisions. A negative result may occur with  improper specimen collection/handling, submission of specimen other than nasopharyngeal swab, presence of viral mutation(s) within the areas targeted by this assay, and inadequate number of viral copies(<138 copies/mL). A negative result must be combined with clinical observations, patient history, and epidemiological information. The expected result is Negative.  Fact Sheet for Patients:  EntrepreneurPulse.com.au  Fact Sheet for Healthcare Providers:  IncredibleEmployment.be  This test is no t yet approved or cleared by the [place] FDA and  has been authorized for detection and/or diagnosis of SARS-CoV-2 by FDA under an Emergency Use Authorization (EUA). This EUA will remain  in effect (meaning this test can be used) for the duration of the COVID-19 declaration under Section 564(b)(1) of the Act, 21 U.S.C.section 360bbb-3(b)(1), unless the authorization is terminated  or revoked sooner.       Influenza A by PCR NEGATIVE NEGATIVE Final   Influenza B by PCR NEGATIVE NEGATIVE Final    Comment: (NOTE) The Xpert Xpress SARS-CoV-2/FLU/RSV plus assay is intended as an aid in the diagnosis of influenza from Nasopharyngeal swab specimens and should not be used as a sole basis for treatment. Nasal washings and aspirates are unacceptable for Xpert Xpress SARS-CoV-2/FLU/RSV testing.  Fact Sheet for Patients: EntrepreneurPulse.com.au  Fact Sheet for Healthcare Providers: IncredibleEmployment.be  This test is not yet  approved or cleared by the [place] FDA and has been authorized for detection and/or diagnosis of SARS-CoV-2 by FDA under an Emergency Use Authorization (EUA). This EUA will remain in effect (meaning this test can be used) for the duration of the COVID-19 declaration under Section 564(b)(1) of the Act, 21 U.S.C. section 360bbb-3(b)(1), unless the authorization is terminated or revoked.  Performed at [facility], [place] [address], [place], [place] [address]     Radiology Studies: No results found.  Scheduled Meds:  apixaban  5 mg Oral BID   azithromycin  500 mg Oral Daily   diltiazem  180 mg Oral Daily   memantine  10 mg Oral BID   mometasone-formoterol  2 puff Inhalation BID   Continuous Infusions:  cefTRIAXone (ROCEPHIN)  IV 2 g (09/17/21 0512)   potassium PHOSPHATE IVPB (in mmol) 30 mmol (09/17/21 1012)     LOS: 2 days    Time spent: 35 mins    Brynlei Klausner, MD [facility]   If 7PM-7AM, please contact night-coverage

## 2021-09-17 NOTE — Progress Notes (Signed)
Mobility Specialist - Progress Note   09/17/21 0900  Mobility  Activity Ambulated with assistance in hallway  Level of Assistance Minimal assist, patient does 75% or more  Assistive Device Four wheel walker  Distance Ambulated (ft) 200 ft  Activity Response Tolerated well  $Mobility charge 1 Mobility   Pt received in bed and agreed for mobility, c/o pain in arms and hands during ambulation. Pt returned to chair with all needs met and family in room.   Roderick Pee Mobility Specialist

## 2021-09-17 NOTE — Progress Notes (Signed)
Mobility Specialist - Progress Note   09/17/21 1304  Mobility  Activity Ambulated with assistance in hallway  Level of Assistance Minimal assist, patient does 75% or more  Assistive Device Four wheel walker  Distance Ambulated (ft) 500 ft  Activity Response Tolerated well  $Mobility charge 1 Mobility   Pt received in chair and agreed for mobility. During ambulation c/o pain in shoulders/arms from resting on walker. Pt returned to bed with all needs met and family in room.   During: 120 HR Post: [place] Specialist

## 2021-09-18 ENCOUNTER — Other Ambulatory Visit: Payer: Self-pay | Admitting: Pulmonary Disease

## 2021-09-18 DIAGNOSIS — J189 Pneumonia, unspecified organism: Secondary | ICD-10-CM | POA: Diagnosis not present

## 2021-09-18 LAB — URINE CULTURE: Culture: NO GROWTH

## 2021-09-18 LAB — PHOSPHORUS: Phosphorus: 1.8 mg/dL — ABNORMAL LOW (ref 2.5–4.6)

## 2021-09-18 LAB — POTASSIUM: Potassium: 3.4 mmol/L — ABNORMAL LOW (ref 3.5–5.1)

## 2021-09-18 LAB — MAGNESIUM: Magnesium: 2.1 mg/dL (ref 1.7–2.4)

## 2021-09-18 MED ORDER — GUAIFENESIN-DM 100-10 MG/5ML PO SYRP
5.0000 mL | ORAL_SOLUTION | ORAL | Status: DC | PRN
  Administered 2021-09-18: 5 mL via ORAL
  Filled 2021-09-18: qty 10

## 2021-09-18 MED ORDER — CEFDINIR 300 MG PO CAPS: 300.0000 mg | ORAL_CAPSULE | Freq: Two times a day (BID) | ORAL | 0 refills | Status: AC

## 2021-09-18 MED ORDER — POTASSIUM CHLORIDE 20 MEQ PO PACK
40.0000 meq | PACK | Freq: Once | ORAL | Status: AC
  Administered 2021-09-18: 40 meq via ORAL
  Filled 2021-09-18: qty 2

## 2021-09-18 MED ORDER — POTASSIUM PHOSPHATES 15 MMOLE/5ML IV SOLN
30.0000 mmol | Freq: Once | INTRAVENOUS | Status: AC
  Administered 2021-09-18: 30 mmol via INTRAVENOUS
  Filled 2021-09-18: qty 10

## 2021-09-18 NOTE — Plan of Care (Signed)
Pt discharge paperwork reviewed with pt and family.  PIV discontinued.  Problem: Education: Goal: Knowledge of General Education information will improve Description: Including pain rating scale, medication(s)/side effects and non-pharmacologic comfort measures Outcome: Adequate for Discharge   Problem: Health Behavior/Discharge Planning: Goal: Ability to manage health-related needs will improve Outcome: Adequate for Discharge   Problem: Clinical Measurements: Goal: Ability to maintain clinical measurements within normal limits will improve Outcome: Adequate for Discharge Goal: Will remain free from infection Outcome: Adequate for Discharge Goal: Diagnostic test results will improve Outcome: Adequate for Discharge Goal: Respiratory complications will improve Outcome: Adequate for Discharge Goal: Cardiovascular complication will be avoided Outcome: Adequate for Discharge   Problem: Activity: Goal: Risk for activity intolerance will decrease Outcome: Adequate for Discharge   Problem: Nutrition: Goal: Adequate nutrition will be maintained Outcome: Adequate for Discharge   Problem: Coping: Goal: Level of anxiety will decrease Outcome: Adequate for Discharge   Problem: Elimination: Goal: Will not experience complications related to bowel motility Outcome: Adequate for Discharge Goal: Will not experience complications related to urinary retention Outcome: Adequate for Discharge   Problem: Pain Managment: Goal: General experience of comfort will improve Outcome: Adequate for Discharge   Problem: Safety: Goal: Ability to remain free from injury will improve Outcome: Adequate for Discharge   Problem: Skin Integrity: Goal: Risk for impaired skin integrity will decrease Outcome: Adequate for Discharge   Problem: Activity: Goal: Ability to tolerate increased activity will improve Outcome: Adequate for Discharge   Problem: Clinical Measurements: Goal: Ability to maintain  a body temperature in the normal range will improve Outcome: Adequate for Discharge   Problem: Respiratory: Goal: Ability to maintain adequate ventilation will improve Outcome: Adequate for Discharge Goal: Ability to maintain a clear airway will improve Outcome: Adequate for Discharge   Problem: Fluid Volume: Goal: Hemodynamic stability will improve Outcome: Adequate for Discharge   Problem: Clinical Measurements: Goal: Diagnostic test results will improve Outcome: Adequate for Discharge Goal: Signs and symptoms of infection will decrease Outcome: Adequate for Discharge   Problem: Respiratory: Goal: Ability to maintain adequate ventilation will improve Outcome: Adequate for Discharge

## 2021-09-18 NOTE — TOC Transition Note (Signed)
Transition of Care [facility]) - CM/SW Discharge Note   Patient Details  Name: Bridget [name] MRN: [id number] Date of Birth: 07-25-1921  [facility] CM/SW Contact:  Leeroy Cha, RN Phone Number: 09/18/2021, 11:09 AM   Clinical Narrative:    [id number] discharged to return home.  Chart reviewed for TOC needs.  None found.  Patient self care.   Final next level of care: Home/Self Care Barriers to Discharge: Barriers Resolved   Patient Goals and CMS Choice Patient states their goals for this hospitalization and ongoing recovery are:: to go home CMS Medicare.gov Compare Post Acute Care list provided to:: Patient Choice offered to / list presented to : Patient  Discharge Placement                       Discharge Plan and Services   Discharge Planning Services: CM Consult                                 Social Determinants of Health (SDOH) Interventions     Readmission Risk Interventions   No data to display

## 2021-09-18 NOTE — Discharge Instructions (Signed)
Advised to follow-up with primary care physician in 1 week. Advised to take Omnicef 300 mg twice daily for 3 more days for CAP. Advised to follow-up with PCP for repeat chest x-ray in 3 to 4 weeks.

## 2021-09-18 NOTE — Discharge Summary (Signed)
Physician Discharge Summary  Bridget [name] EHU:[id number] DOB: [birth year] DOA: 09/15/2021  PCP: Haywood Pao, MD  Admit date: 09/15/2021  Discharge date: 09/18/2021  Admitted From: Home.  Disposition:  Home health Services.  Recommendations for Outpatient Follow-up:  Follow up with PCP in 1-2 weeks. Please obtain BMP/CBC in one week. Advised to take Omnicef 300 mg twice daily for 3 more days for CAP. Advised to follow-up with PCP for repeat chest x-ray in 3 to 4 weeks.  [facility] PT/ OT Equipment/Devices:None  Discharge Condition: Stable CODE STATUS:Full code Diet recommendation: Heart Healthy   Brief [facility] Course: This 88 years old female with PMH significant for aortic atherosclerosis, chronic atrial fibrillation, breast cancer, chronic bronchitis, bronchiectasis, CAD, nocturnal polyuria, history of other nonhemorrhagic stroke, thrombus of left atrial appendage, hyperlipidemia, idiopathic peripheral neuropathy, multiple lung nodules, osteoarthritis, dementia who was brought in from her facility with complaints of productive cough for last 4 days associated with fatigue.  Her O2 saturation was 91% when EMS arrived.  Patient was febrile with a temp of 100.4,  O2 saturation has reduced to 89% on room air after arrival and she became tachypneic. she was placed on oxygen and was started on antibiotics.  Chest x-ray shows left upper lobe pneumonia along with chronic cardiac enlargement.  Patient was admitted for community-acquired pneumonia, she was continued on IV antibiotics ceftriaxone and Zithromax.  Strep pneumo antigen negative.  Blood and urine cultures grow no growth.  Lactic acid was 2.7 which improved with IV hydration.  Sepsis physiology has resolved.  Patient continued on home medications.  Electrolytes were replaced.  Patient has ambulated in the hallway without any difficulty walking.  Patient has 24 however family support.  Daughter provides 24-hour  care.  Patient is being discharged home.  Discharge Diagnoses:  Principal Problem:   Pneumonia Active Problems:   Thrombus of left atrial appendage   Bronchiectasis without complication (HCC)   Sepsis (HCC)   Hyperglycemia   Hyperbilirubinemia   Chronic atrial fibrillation with RVR (HCC)   Dementia (HCC)   Hypomagnesemia  Sepsis  / Community-acquired pneumonia: Patient presented with cough, fever, tachycardia, hypoxia  and Lactic acid 2.7 Chest x-ray shows left upper lobe pneumonia. Continue empiric antibiotics ceftriaxone and Zithromax. Strep Pneumo: negative,  Legionella urinary antigen pending Check sputum Gram stain culture and sensitivity Blood and urine cultures no growth so far Lactic acid 2.7> improved with IV hydration. Sepsis physiology improved. Antibiotic changed to [facility] for 3 more days.   Bronchiectasis without complication: Continue antibiotics for pneumonia Continue Brovana and Pulmicort Continue Xopenex as needed every 6 hours   Chronic atrial fibrillation with RVR: Heart rate is now reasonably controlled. Continue Cardizem CD 180 mg daily Continue Eliquis 5 mg twice daily.   Thrombus of left atrial appendage: Continue Eliquis   Hypomagnesemia: Replaced.  Continue to monitor.   Hyperglycemia: Check hemoglobin A1c   Hyper bilirubinemia: Mild elevation could be in the setting of pneumonia. Continue to trend LFTs   Dementia: Continue supportive care.    Generalized weakness: PT and OT eval> Home health services  Discharge Instructions  Discharge Instructions     Call MD for:  difficulty breathing, headache or visual disturbances   Complete by: As directed    Call MD for:  persistant dizziness or light-headedness   Complete by: As directed    Call MD for:  persistant nausea and vomiting   Complete by: As directed    Diet - low sodium heart  healthy   Complete by: As directed    Diet Carb Modified   Complete by: As directed    Discharge  instructions   Complete by: As directed    Advised to follow-up with primary care physician in 1 week. Advised to take Omnicef 300 mg twice daily for 3 more days for CAP. Advised to follow-up with PCP for repeat chest x-ray in 3 to 4 weeks.   Increase activity slowly   Complete by: As directed       Allergies as of 09/18/2021       Reactions   Pulmicort [budesonide] Swelling   Eye swelling, ankle and knee swelling   Erythromycin Other (See Comments)   Unknown, but "all the mycins bother me"   Roxicodone [oxycodone] Other (See Comments)   Confusion    Ultram [tramadol] Other (See Comments)   Confusion         Medication List     TAKE these medications    acetaminophen 500 MG tablet Commonly known as: TYLENOL Take 1,000 mg by mouth daily as needed for mild pain or headache.   Advair HFA 115-21 MCG/ACT inhaler Generic drug: fluticasone-salmeterol Inhale 2 puffs into the lungs 2 (two) times daily.   albuterol 108 (90 Base) MCG/ACT inhaler Commonly known as: VENTOLIN HFA Inhale 2 puffs into the lungs every 6 (six) hours as needed for shortness of breath or wheezing.   apixaban 5 MG Tabs tablet Commonly known as: Eliquis Take 1 tablet (5 mg total) by mouth 2 (two) times daily.   B COMPLEX PO Take 1 capsule by mouth daily.   Besivance 0.6 % Susp Generic drug: Besifloxacin HCl Place 1 drop into both eyes See admin instructions. Instill 1 drop into each eye 4 times daily for 2 days after eye injections   cefdinir 300 MG capsule Commonly known as: OMNICEF Take 1 capsule (300 mg total) by mouth 2 (two) times daily for 3 days.   D-MANNOSE PO Take 2 tablets by mouth 2 (two) times daily.   UritraX Powd Take 1 Scoop by mouth daily in the afternoon.   diltiazem 180 MG 24 hr capsule Commonly known as: TIAZAC Take 180 mg by mouth daily.   docusate sodium 100 MG capsule Commonly known as: COLACE Take 100 mg by mouth daily as needed for mild constipation.   FISH OIL  PO Take 15 mLs by mouth at bedtime.   furosemide 20 MG tablet Commonly known as: LASIX Take 20 mg by mouth daily as needed for edema.   ketoconazole 2 % shampoo Commonly known as: NIZORAL Apply 1 Application topically See admin instructions. Apply and lather on skin and let sit for a few minutes prior to rinsing when you shower   memantine 10 MG tablet Commonly known as: NAMENDA Take 1 tablet by mouth 2 (two) times daily.   OVER THE COUNTER MEDICATION Take 1 each by mouth in the morning. CBD gummy   OVER THE COUNTER MEDICATION Take 1 each by mouth at bedtime. CBD + melatonin gummy   OVER THE COUNTER MEDICATION Take 1 Scoop by mouth daily. Kalmegh powder supplement   PreserVision AREDS 2 Caps Take 1 capsule by mouth in the morning and at bedtime.   Prolia 60 MG/ML Sosy injection Generic drug: denosumab Inject 60 mg into the skin See admin instructions. Every 6 months   Resveratrol 250 MG Caps Take 1 capsule by mouth 2 (two) times daily.   senna 8.6 MG tablet Commonly known as: SENOKOT Take  1 tablet by mouth daily as needed for constipation.        Follow-up Information     Tisovec, Fransico Him, MD Follow up in 1 week(s).   Specialty: Internal Medicine Contact information: [place] [id number] [phone]                Allergies  Allergen Reactions   Pulmicort [Budesonide] Swelling    Eye swelling, ankle and knee swelling   Erythromycin Other (See Comments)    Unknown, but "all the mycins bother me"   Roxicodone [Oxycodone] Other (See Comments)    Confusion    Ultram [Tramadol] Other (See Comments)    Confusion     Consultations: None   Procedures/Studies: DG CHEST PORT 1 VIEW  Result Date: 09/17/2021 CLINICAL DATA:  Pneumonia. EXAM: PORTABLE CHEST 1 VIEW COMPARISON:  Chest x-ray 09/15/2021 FINDINGS: Hazy focal airspace opacity in the left upper lobe has slightly increased. Cardiac silhouette is enlarged, unchanged. There is  a new small left pleural effusion. There is no pneumothorax or acute fracture. IMPRESSION: 1. Increasing left upper lobe airspace disease. 2. New small left pleural effusion. 3. Stable cardiomegaly. Electronically Signed   By: Ronney Asters M.D.   On: 09/17/2021 16:19   DG Chest Port 1 View  Result Date: 09/15/2021 CLINICAL DATA:  Sepsis.  Cough.  History of bronchitis and dementia. EXAM: PORTABLE CHEST 1 VIEW COMPARISON:  CT chest 06/29/2021 FINDINGS: Cardiac enlargement is again noted and appears similar to the previous exam. No signs of pleural effusion. Asymmetric airspace opacification within the left upper lobe is identified compatible with pneumonia. Thoracolumbar scoliosis and degenerative disc disease. IMPRESSION: 1. Left upper lobe pneumonia. Followup PA and lateral chest X-ray is recommended in 3-4 weeks following trial of antibiotic therapy to ensure resolution and exclude underlying malignancy. 2. Chronic cardiac enlargement. Electronically Signed   By: Kerby Moors M.D.   On: 09/15/2021 05:40     Subjective: Patient was seen and examined at bedside.  Overnight events noted.   Patient reports feeling much improved.  She has participated in physical therapy and wants to be discharged.  Discharge Exam: Vitals:   09/18/21 0829 09/18/21 0945  BP:  115/70  Pulse:    Resp:    Temp:    SpO2: 94%    Vitals:   09/17/21 2202 09/18/21 0526 09/18/21 0829 09/18/21 0945  BP: 129/67 (!) 149/87  115/70  Pulse: 93 (!) 103    Resp: 16 17    Temp: 98.4 F (36.9 C) 98.8 F (37.1 C)    TempSrc: Oral Oral    SpO2: 92% 92% 94%   Weight:      Height:        General: Pt is alert, awake, not in acute distress Cardiovascular: RRR, S1/S2 +, no rubs, no gallops Respiratory: CTA bilaterally, no wheezing, no rhonchi Abdominal: Soft, NT, ND, bowel sounds + Extremities: no edema, no cyanosis    The results of significant diagnostics from this hospitalization (including imaging, microbiology,  ancillary and laboratory) are listed below for reference.     Microbiology: Recent Results (from the past 240 hour(s))  Blood Culture (routine x 2)     Status: None (Preliminary result)   Collection Time: 09/15/21  4:32 AM   Specimen: Left Antecubital; Blood  Result Value Ref Range Status   Specimen Description   Final    LEFT ANTECUBITAL BLOOD Performed at [facility], [address], [place], [place] [address]  Special Requests   Final    BOTTLES DRAWN AEROBIC AND ANAEROBIC Blood Culture results may not be optimal due to an excessive volume of blood received in culture bottles Performed at [facility] [address], [place], [place] [address]    Culture   Final    NO GROWTH 3 DAYS Performed at [facility], [place] [address] [place], [place] [address]    Report Status PENDING  Incomplete  Blood Culture (routine x 2)     Status: None (Preliminary result)   Collection Time: 09/15/21  4:44 AM   Specimen: BLOOD LEFT FOREARM  Result Value Ref Range Status   Specimen Description   Final    BLOOD LEFT FOREARM Performed at [facility] [address], [place], [place] [address]    Special Requests   Final    BOTTLES DRAWN AEROBIC AND ANAEROBIC Blood Culture adequate volume Performed at [facility] [address] [place], [place] [address]    Culture   Final    NO GROWTH 3 DAYS Performed at [facility], [place] [address], [place], [place] [address]    Report Status PENDING  Incomplete  Resp Panel by RT-PCR (Flu A&B, Covid) Anterior Nasal Swab     Status: None   Collection Time: 09/15/21  5:02 AM   Specimen: Anterior Nasal Swab  Result Value Ref Range Status   SARS Coronavirus 2 by RT PCR NEGATIVE NEGATIVE Final    Comment: (NOTE) SARS-CoV-2 target nucleic acids are NOT DETECTED.  The SARS-CoV-2 RNA is generally detectable in upper respiratory specimens during the acute phase of infection. The  lowest concentration of SARS-CoV-2 viral copies this assay can detect is 138 copies/mL. A negative result does not preclude SARS-Cov-2 infection and should not be used as the sole basis for treatment or other patient management decisions. A negative result may occur with  improper specimen collection/handling, submission of specimen other than nasopharyngeal swab, presence of viral mutation(s) within the areas targeted by this assay, and inadequate number of viral copies(<138 copies/mL). A negative result must be combined with clinical observations, patient history, and epidemiological information. The expected result is Negative.  Fact Sheet for Patients:  EntrepreneurPulse.com.au  Fact Sheet for Healthcare Providers:  IncredibleEmployment.be  This test is no t yet approved or cleared by the [place] FDA and  has been authorized for detection and/or diagnosis of SARS-CoV-2 by FDA under an Emergency Use Authorization (EUA). This EUA will remain  in effect (meaning this test can be used) for the duration of the COVID-19 declaration under Section 564(b)(1) of the Act, 21 U.S.C.section 360bbb-3(b)(1), unless the authorization is terminated  or revoked sooner.       Influenza A by PCR NEGATIVE NEGATIVE Final   Influenza B by PCR NEGATIVE NEGATIVE Final    Comment: (NOTE) The Xpert Xpress SARS-CoV-2/FLU/RSV plus assay is intended as an aid in the diagnosis of influenza from Nasopharyngeal swab specimens and should not be used as a sole basis for treatment. Nasal washings and aspirates are unacceptable for Xpert Xpress SARS-CoV-2/FLU/RSV testing.  Fact Sheet for Patients: EntrepreneurPulse.com.au  Fact Sheet for Healthcare Providers: IncredibleEmployment.be  This test is not yet approved or cleared by the [place] FDA and has been authorized for detection and/or diagnosis of SARS-CoV-2 by FDA under  an Emergency Use Authorization (EUA). This EUA will remain in effect (meaning this test can be used) for the duration of the COVID-19 declaration under Section  564(b)(1) of the Act, 21 U.S.C. section 360bbb-3(b)(1), unless the authorization is terminated or revoked.  Performed at [facility], [place] [address], [place], [place] [address]   Urine Culture     Status: None   Collection Time: 09/17/21  2:08 AM   Specimen: In/Out Cath Urine  Result Value Ref Range Status   Specimen Description   Final    IN/OUT CATH URINE Performed at [facility] [address] [place], [place] [address]    Special Requests   Final    NONE Performed at [facility], [place] [address], [place], [place] [address]    Culture   Final    NO GROWTH Performed at [facility], [place] [address] [place], [place] [address]    Report Status 09/18/2021 FINAL  Final     Labs: BNP (last 3 results) No results for input(s): "BNP" in the last 8760 hours. Basic Metabolic Panel: Recent Labs  Lab 09/15/21 0440 09/15/21 0858 09/15/21 1047 09/16/21 0705 09/17/21 0601 09/18/21 0547  NA 142  --   --  141 143  --   K 3.8  --   --  3.5 3.3* 3.4*  CL 103  --   --  110 111  --   CO2 28  --   --  25 26  --   GLUCOSE 160*  --   --  117* 123*  --   BUN 15  --   --  11 9  --   CREATININE 0.77  --   --  0.59 0.62  --   CALCIUM 9.3  --   --  8.0* 8.2*  --   MG  --  0.9*  --  2.2 2.2 2.1  PHOS  --   --  2.4*  --  1.9* 1.8*   Liver Function Tests: Recent Labs  Lab 09/15/21 0440 09/16/21 0705  AST 24 19  ALT 25 22  ALKPHOS 50 44  BILITOT 1.4* 1.0  PROT 6.8 5.5*  ALBUMIN 4.1 2.8*   No results for input(s): "LIPASE", "AMYLASE" in the last 168 hours. No results for input(s): "AMMONIA" in the last 168 hours. CBC: Recent Labs  Lab 09/15/21 0440 09/16/21 0705 09/17/21 0601  WBC 9.3 11.6* 8.7  NEUTROABS 8.3* 9.8*  --   HGB 13.7 12.0 12.0  HCT 42.3  36.9 36.6  MCV 102.9* 103.1* 103.4*  PLT 181 146* 149*   Cardiac Enzymes: No results for input(s): "CKTOTAL", "CKMB", "CKMBINDEX", "TROPONINI" in the last 168 hours. BNP: Invalid input(s): "POCBNP" CBG: No results for input(s): "GLUCAP" in the last 168 hours. D-Dimer No results for input(s): "DDIMER" in the last 72 hours. Hgb A1c No results for input(s): "HGBA1C" in the last 72 hours. Lipid Profile No results for input(s): "CHOL", "HDL", "LDLCALC", "TRIG", "CHOLHDL", "LDLDIRECT" in the last 72 hours. Thyroid function studies No results for input(s): "TSH", "T4TOTAL", "T3FREE", "THYROIDAB" in the last 72 hours.  Invalid input(s): "FREET3" Anemia work up No results for input(s): "VITAMINB12", "FOLATE", "FERRITIN", "TIBC", "IRON", "RETICCTPCT" in the last 72 hours. Urinalysis    Component Value Date/Time   COLORURINE STRAW (A) 09/17/2021 0208   APPEARANCEUR CLEAR 09/17/2021 0208   LABSPEC 1.003 (L) 09/17/2021 0208   PHURINE 7.0 09/17/2021 0208   GLUCOSEU NEGATIVE 09/17/2021 0208   HGBUR NEGATIVE 09/17/2021 0208   BILIRUBINUR NEGATIVE 09/17/2021 0208   KETONESUR NEGATIVE 09/17/2021 0208   PROTEINUR NEGATIVE 09/17/2021 0208   NITRITE NEGATIVE 09/17/2021  [place] 09/17/2021 0208   Sepsis Labs Recent Labs  Lab 09/15/21 0440 09/16/21 0705 09/17/21 0601  WBC 9.3 11.6* 8.7   Microbiology Recent Results (from the past 240 hour(s))  Blood Culture (routine x 2)     Status: None (Preliminary result)   Collection Time: 09/15/21  4:32 AM   Specimen: Left Antecubital; Blood  Result Value Ref Range Status   Specimen Description   Final    LEFT ANTECUBITAL BLOOD Performed at [facility] [address] [place], [place] [address]    Special Requests   Final    BOTTLES DRAWN AEROBIC AND ANAEROBIC Blood Culture results may not be optimal due to an excessive volume of blood received in culture bottles Performed at [facility]  [address], [place], [place] [address]    Culture   Final    NO GROWTH 3 DAYS Performed at [facility], [place] [address] [place], [place] [address]    Report Status PENDING  Incomplete  Blood Culture (routine x 2)     Status: None (Preliminary result)   Collection Time: 09/15/21  4:44 AM   Specimen: BLOOD LEFT FOREARM  Result Value Ref Range Status   Specimen Description   Final    BLOOD LEFT FOREARM Performed at [facility] [address], [place], [place] [address]    Special Requests   Final    BOTTLES DRAWN AEROBIC AND ANAEROBIC Blood Culture adequate volume Performed at [facility] [address] [place], [place] [address]    Culture   Final    NO GROWTH 3 DAYS Performed at [facility], [place] [address] [place], [place] [address]    Report Status PENDING  Incomplete  Resp Panel by RT-PCR (Flu A&B, Covid) Anterior Nasal Swab     Status: None   Collection Time: 09/15/21  5:02 AM   Specimen: Anterior Nasal Swab  Result Value Ref Range Status   SARS Coronavirus 2 by RT PCR NEGATIVE NEGATIVE Final    Comment: (NOTE) SARS-CoV-2 target nucleic acids are NOT DETECTED.  The SARS-CoV-2 RNA is generally detectable in upper respiratory specimens during the acute phase of infection. The lowest concentration of SARS-CoV-2 viral copies this assay can detect is 138 copies/mL. A negative result does not preclude SARS-Cov-2 infection and should not be used as the sole basis for treatment or other patient management decisions. A negative result may occur with  improper specimen collection/handling, submission of specimen other than nasopharyngeal swab, presence of viral mutation(s) within the areas targeted by this assay, and inadequate number of viral copies(<138 copies/mL). A negative result must be combined with clinical observations, patient history, and epidemiological information. The expected result is Negative.  Fact Sheet for  Patients:  EntrepreneurPulse.com.au  Fact Sheet for Healthcare Providers:  IncredibleEmployment.be  This test is no t yet approved or cleared by the [place] FDA and  has been authorized for detection and/or diagnosis of SARS-CoV-2 by FDA under an Emergency Use Authorization (EUA). This EUA will remain  in effect (meaning this test can be used) for the duration of the COVID-19 declaration under Section 564(b)(1) of the Act, 21 U.S.C.section 360bbb-3(b)(1), unless the authorization is terminated  or revoked sooner.       Influenza A by PCR NEGATIVE NEGATIVE Final   Influenza B by PCR NEGATIVE NEGATIVE Final    Comment: (NOTE) The Xpert Xpress SARS-CoV-2/FLU/RSV plus assay is intended as  an aid in the diagnosis of influenza from Nasopharyngeal swab specimens and should not be used as a sole basis for treatment. Nasal washings and aspirates are unacceptable for Xpert Xpress SARS-CoV-2/FLU/RSV testing.  Fact Sheet for Patients: EntrepreneurPulse.com.au  Fact Sheet for Healthcare Providers: IncredibleEmployment.be  This test is not yet approved or cleared by the [place] FDA and has been authorized for detection and/or diagnosis of SARS-CoV-2 by FDA under an Emergency Use Authorization (EUA). This EUA will remain in effect (meaning this test can be used) for the duration of the COVID-19 declaration under Section 564(b)(1) of the Act, 21 U.S.C. section 360bbb-3(b)(1), unless the authorization is terminated or revoked.  Performed at [facility], [place] [address] [place], [place] [address]   Urine Culture     Status: None   Collection Time: 09/17/21  2:08 AM   Specimen: In/Out Cath Urine  Result Value Ref Range Status   Specimen Description   Final    IN/OUT CATH URINE Performed at [facility] [address], [place], [place] [address]    Special Requests    Final    NONE Performed at [facility], [place] [address], [place], [place] [address]    Culture   Final    NO GROWTH Performed at [facility], [place] [address], [place], [place] [address]    Report Status 09/18/2021 FINAL  Final     Time coordinating discharge: Over 30 minutes  SIGNED:   Shawna Clamp, MD  [facility] 09/18/2021, 3:13 PM Pager   If 7PM-7AM, please contact night-coverage

## 2021-09-18 NOTE — Evaluation (Signed)
Physical Therapy Evaluation Patient Details Name: Bridget [name] MRN: [id number] DOB: February 24, 1921 Today's Date: 09/18/2021  History of Present Illness  Pt is a 88yo female presenting to [facility] ED on 9/10 from facility reporting cough, imaging revealed pneumonia with chornic cardiac enlargement, scoliosis. PMH: atrial fibrillation, hx of breast cancer, chronic ronchiectasis, cAD, nocturnal polyuria, hx of CVA, HLD, peripheral neuropathy, dementia.   Clinical Impression  Pt admitted with above diagnosis; evaluated by Physical Therapy with no further acute PT needs identified. Pt lives with daughter and requires min assist for mobility and ADLs; uses SPC and rollator at baseline.  Pt currently with functional limitations due to the deficits listed below (see PT Problem List). Pt required min assist for transfers, min guard for ambulation in hallway with rollator 232ft with no overt LOB, HR max 160 mid-mobility, RN notified. Educated pt on postural adjustments during ambulation to reduce BUE weightbearing as pt complaining of pain; pt able to perform with cuing.  All education has been completed and the patient has no further questions. See below for any follow-up Physical Therapy or equipment needs. PT is signing off. Thank you for this referral.      Recommendations for follow up therapy are one component of a multi-disciplinary discharge planning process, led by the attending physician.  Recommendations may be updated based on patient status, additional functional criteria and insurance authorization.  Follow Up Recommendations Outpatient PT (Pt already being seen by OPPT, recommend resumption of care once cleared by MD)      Assistance Recommended at Discharge Frequent or constant Supervision/Assistance  Patient can return home with the following  A little help with walking and/or transfers;A little help with bathing/dressing/bathroom;Assistance with cooking/housework;Direct supervision/assist for  medications management;Direct supervision/assist for financial management;Assist for transportation;Help with stairs or ramp for entrance    Equipment Recommendations None recommended by PT  Recommendations for Other Services       Functional Status Assessment Patient has not had a recent decline in their functional status     Precautions / Restrictions Precautions Precautions: Fall Restrictions Weight Bearing Restrictions: No      Mobility  Bed Mobility               General bed mobility comments: Pt in recliner at entry and exit    Transfers Overall transfer level: Needs assistance Equipment used: Rollator (4 wheels) Transfers: Sit to/from Stand Sit to Stand: Min assist           General transfer comment: Pt light min assist for sit to stand transfer, lift assistance to reach fully upright standing.    Ambulation/Gait Ambulation/Gait assistance: Min guard Gait Distance (Feet): 250 Feet Assistive device: Rollator (4 wheels) Gait Pattern/deviations: Step-through pattern, Decreased dorsiflexion - right, Decreased dorsiflexion - left, Narrow base of support Gait velocity: decreased     General Gait Details: Pt ambulated with Rollator and min guard, no physical assist required or overt LOB noted. Demonstrated very narrow BOS with feet nearly touching, shortened step length, trunk flexed; pt able to stand up taller to reduce weightbearing on BUE with moderate verbal cuing, gaze fixed down able to correct with cuing, standing rest break.  Stairs            Wheelchair Mobility    Modified Rankin (Stroke Patients Only)       Balance  Pertinent Vitals/Pain Pain Assessment Pain Assessment: Faces Faces Pain Scale: Hurts a little bit Pain Location: BUE during ambulation Pain Descriptors / Indicators: Discomfort, Sore Pain Intervention(s): Limited activity within patient's tolerance,  Monitored during session, Repositioned    Home Living Family/patient expects to be discharged to:: Private residence Living Arrangements: Children Available Help at Discharge: Family;Available 24 hours/day Type of Home: House Home Access: Stairs to enter Entrance Stairs-Rails: Left Entrance Stairs-Number of Steps: 3   Home Layout: One level Home Equipment: Rollator (4 wheels);Cane - single point;Grab bars - tub/shower;Grab bars - toilet;Toilet riser;Tub bench Additional Comments: Daughter Bridget [name] provides caregiving and home environment    Prior Function Prior Level of Function : Needs assist       Physical Assist : Mobility (physical);ADLs (physical) Mobility (physical): Transfers;Gait ADLs (physical): Bathing;Toileting Mobility Comments: Pt uses rollator at baseline for ambulation and SPC + railing for stairs with guarding from daughter ADLs Comments: Pt requires min assist for getting on/off toilet, entry/egress of shower via tub bench, supervision for showering and toileting IND with pericare, dressing; supervision for safety at all times     Hand Dominance        Extremity/Trunk Assessment   Upper Extremity Assessment Upper Extremity Assessment: Generalized weakness;Overall WFL for tasks assessed    Lower Extremity Assessment Lower Extremity Assessment: RLE deficits/detail;LLE deficits/detail;Generalized weakness RLE Deficits / Details: Hip/knee/ankle: Functional ROM, MMT grossly 3+/5, history of peripheral neuropathy RLE Sensation: history of peripheral neuropathy LLE Deficits / Details: Hip/knee/ankle: Functional ROM, MMT grossly 3+/5, history of peripheral neuropathy LLE Sensation: history of peripheral neuropathy    Cervical / Trunk Assessment Cervical / Trunk Assessment: Kyphotic  Communication   Communication: HOH  Cognition Arousal/Alertness: Awake/alert Behavior During Therapy: WFL for tasks assessed/performed Overall Cognitive Status: History of  cognitive impairments - at baseline                                 General Comments: Pt pleasant, HOH, AOx1 (self only, cannot name month, location, or president), Daughter reports pt is at baseline.        General Comments General comments (skin integrity, edema, etc.): HR monitored via tele, ranged from 118 at rest to 160 during ambulation, RN notified.    Exercises     Assessment/Plan    PT Assessment All further PT needs can be met in the next venue of care  PT Problem List Decreased strength;Decreased range of motion;Decreased activity tolerance;Decreased balance;Decreased mobility;Decreased coordination;Decreased cognition       PT Treatment Interventions      PT Goals (Current goals can be found in the Care Plan section)  Acute Rehab PT Goals Patient Stated Goal: Go home PT Goal Formulation: With patient Time For Goal Achievement: 10/02/21 Potential to Achieve Goals: Good    Frequency       Co-evaluation               AM-PAC PT "6 Clicks" Mobility  Outcome Measure Help needed turning from your back to your side while in a flat bed without using bedrails?: A Little Help needed moving from lying on your back to sitting on the side of a flat bed without using bedrails?: A Little Help needed moving to and from a bed to a chair (including a wheelchair)?: A Little Help needed standing up from a chair using your arms (e.g., wheelchair or bedside chair)?: A Little Help needed to walk in hospital room?: A  Little Help needed climbing 3-5 steps with a railing? : A Little 6 Click Score: 18    End of Session Equipment Utilized During Treatment: Other (comment);Gait belt (rollator) Activity Tolerance: Patient tolerated treatment well;No increased pain Patient left: Other (comment) (In bathroom with family, RN notified) Nurse Communication: Mobility status PT Visit Diagnosis: Unsteadiness on feet (R26.81)    Time: 2194-7125 PT Time Calculation (min)  (ACUTE ONLY): 24 min   Charges:   PT Evaluation $PT Eval Low Complexity: [place], PT, DPT [facility] Office: [phone]  Coolidge Breeze 09/18/2021, 11:36 AM

## 2021-09-18 NOTE — Progress Notes (Signed)
Mobility Specialist - Progress Note   09/18/21 0938  Mobility  Activity Ambulated with assistance in hallway  Level of Assistance Minimal assist, patient does 75% or more  Assistive Device Four wheel walker  Distance Ambulated (ft) 300 ft  Activity Response Tolerated well  $Mobility charge 1 Mobility   Pt received in bed and agreed for mobility. Pain in shoulders/arms during ambulation from gripping/leaning on walker. Pt returned to bed with all needs met with family and RN in room.   Roderick Pee Mobility Specialist

## 2021-09-19 ENCOUNTER — Telehealth: Payer: Self-pay | Admitting: Pulmonary Disease

## 2021-09-19 NOTE — Telephone Encounter (Signed)
Dr Loanne Drilling,  Can you look at discharge from the hospital. Pt states she was told to take lasix but is not sure what dosage.   Please advise

## 2021-09-20 LAB — CULTURE, BLOOD (ROUTINE X 2)
Culture: NO GROWTH
Culture: NO GROWTH
Special Requests: ADEQUATE

## 2021-09-20 NOTE — Telephone Encounter (Signed)
Calling again to check on the status- informed message was sent to Dr. Loanne Drilling.

## 2021-09-25 ENCOUNTER — Telehealth: Payer: Self-pay | Admitting: Pulmonary Disease

## 2021-09-25 ENCOUNTER — Ambulatory Visit (INDEPENDENT_AMBULATORY_CARE_PROVIDER_SITE_OTHER): Payer: Medicare Other | Admitting: Adult Health

## 2021-09-25 ENCOUNTER — Encounter: Payer: Self-pay | Admitting: Adult Health

## 2021-09-25 ENCOUNTER — Ambulatory Visit (INDEPENDENT_AMBULATORY_CARE_PROVIDER_SITE_OTHER): Payer: Medicare Other

## 2021-09-25 VITALS — BP 98/60 | HR 95 | Ht 67.0 in | Wt 133.0 lb

## 2021-09-25 DIAGNOSIS — J479 Bronchiectasis, uncomplicated: Secondary | ICD-10-CM

## 2021-09-25 DIAGNOSIS — J189 Pneumonia, unspecified organism: Secondary | ICD-10-CM | POA: Diagnosis not present

## 2021-09-25 DIAGNOSIS — I2584 Coronary atherosclerosis due to calcified coronary lesion: Secondary | ICD-10-CM

## 2021-09-25 DIAGNOSIS — I2510 Atherosclerotic heart disease of native coronary artery without angina pectoris: Secondary | ICD-10-CM

## 2021-09-25 NOTE — Telephone Encounter (Signed)
I was out of town when these messages were sent. Called family and agreed with lasix 20 mg daily. Patient seen by NP [place] today.

## 2021-09-25 NOTE — Telephone Encounter (Signed)
Called and spoke with [place] patients daughter that she will get a reminder in the mail closer to the time for November that Dr Cordelia Pen schedule is open and ready to schedule her mom then. Nothing further needed

## 2021-09-25 NOTE — Telephone Encounter (Signed)
Patient's daughter Bridget [name] called and wanted to let Bridget Edison, NP know that when she checked out- Bridget [name] did not have a schedule for November and wanted to make sure she was not missed. Informed  patient that Bridget [name] was moving to the new [facility] location in late October/ November and her schedule is not ready just yet, but there is a reminder in the system to call her.  Please reach out to Bridget [name] to ensure patient will get scheduled when the [place] office is opened.

## 2021-09-25 NOTE — Patient Instructions (Addendum)
Chest xray today .  Continue on Advair 2 puffs Twice daily with spacer , rinse after use.  Liquid Mucinex 1 tsp Twice daily  As needed cough/congestion .  Albuterol inhaler As needed  Aspiration precautions  Activity as tolerated  Consider Flu , RSV and Covid booster this Fall as discussed .  Follow up with Dr. Loanne Drilling in 4-6 weeks and As needed   Please contact office for sooner follow up if symptoms do not improve or worsen or seek emergency care

## 2021-09-25 NOTE — Progress Notes (Unsigned)
$'@Patient'H$  ID: Bridget [name], female    DOB: Sep 09, 1921, 88 y.o.   MRN: [id number]  Chief Complaint  Patient presents with   Follow-up    Referring provider: Haywood Pao, MD  HPI: 88 year old female followed for bronchiectasis with chronic bronchitis Medical history significant for dementia, A-fib, thrombus of the left atrial appendage.  TEST/EVENTS :   09/25/2021 Follow up : Bronchiectasis, posthospital, pneumonia Patient returns for a posthospital follow-up.  Patient was admitted last week for a left upper lobe community-acquired pneumonia and Sepsis .  She was treated with aggressive IV antibiotics and discharged on Omnicef.  She was treated with aggressive mucociliary clearance with nebulizers.  She did have some A-fib with RVR.  She improved prior to discharge and was discharged on Cardizem and Eliquis. Since discharge she is doing some better.  She remains weak.  Appetite has improved and is seems to be eating well.  Her daughter is with her today who is her caregiver.  Feels that the cough has decreased some.  She has not been able to use a flutter valve due to dementia.  She remains on Advair twice daily as she is able to use this with a spacer.   Allergies  Allergen Reactions   Pulmicort [Budesonide] Swelling    Eye swelling, ankle and knee swelling   Erythromycin Other (See Comments)    Unknown, but "all the mycins bother me"   Roxicodone [Oxycodone] Other (See Comments)    Confusion    Ultram [Tramadol] Other (See Comments)    Confusion     Immunization History  Administered Date(s) Administered   Influenza, High Dose Seasonal PF 09/06/2017   Influenza, Quadrivalent, Recombinant, Inj, Pf 09/30/2018, 10/31/2019, 09/12/2020   Moderna Sars-Covid-2 Vaccination 01/07/2019, 02/04/2019, 11/18/2019    Past Medical History:  Diagnosis Date   Atherosclerosis of aorta ([place])    Atrial fibrillation ([place])    Breast cancer ([place])    Chronic Bronchitis    Coronary  atherosclerosis due to calcified coronary lesion    Nocturnal polyuria    Stroke ([place])    Thrombus of left atrial appendage     Tobacco History: Social History   Tobacco Use  Smoking Status Former   Packs/day: 1.50   Years: 7.00   Total pack years: 10.50   Types: Cigarettes   Quit date: 1950   Years since quitting: 73.7  Smokeless Tobacco Never   Counseling given: Not Answered   Outpatient Medications Prior to Visit  Medication Sig Dispense Refill   acetaminophen (TYLENOL) 500 MG tablet Take 1,000 mg by mouth daily as needed for mild pain or headache.     ADVAIR HFA 115-21 MCG/ACT inhaler Inhale 2 puffs into the lungs 2 (two) times daily.     albuterol (VENTOLIN HFA) 108 (90 Base) MCG/ACT inhaler Inhale 2 puffs into the lungs every 6 (six) hours as needed for shortness of breath or wheezing.     apixaban (ELIQUIS) 5 MG TABS tablet Take 1 tablet (5 mg total) by mouth 2 (two) times daily. 180 tablet 1   B Complex Vitamins (B COMPLEX PO) Take 1 capsule by mouth daily.     Besifloxacin HCl (BESIVANCE) 0.6 % SUSP Place 1 drop into both eyes See admin instructions. Instill 1 drop into each eye 4 times daily for 2 days after eye injections     D-Mannose Paul Dykes) POWD Take 1 Scoop by mouth daily in the afternoon.     D-MANNOSE PO Take 2 tablets by mouth  2 (two) times daily.     denosumab (PROLIA) 60 MG/ML SOSY injection Inject 60 mg into the skin See admin instructions. Every 6 months     diltiazem (TIAZAC) 180 MG 24 hr capsule Take 180 mg by mouth daily.     docusate sodium (COLACE) 100 MG capsule Take 100 mg by mouth daily as needed for mild constipation.     furosemide (LASIX) 20 MG tablet Take 20 mg by mouth daily as needed for edema.     ketoconazole (NIZORAL) 2 % shampoo Apply 1 Application topically See admin instructions. Apply and lather on skin and let sit for a few minutes prior to rinsing when you shower     memantine (NAMENDA) 10 MG tablet Take 1 tablet by mouth 2 (two)  times daily.     Multiple Vitamins-Minerals (PRESERVISION AREDS 2) CAPS Take 1 capsule by mouth in the morning and at bedtime.     Omega-3 Fatty Acids (FISH OIL PO) Take 15 mLs by mouth at bedtime.     OVER THE COUNTER MEDICATION Take 1 each by mouth in the morning. CBD gummy     OVER THE COUNTER MEDICATION Take 1 each by mouth at bedtime. CBD + melatonin gummy     OVER THE COUNTER MEDICATION Take 1 Scoop by mouth daily. Kalmegh powder supplement     Resveratrol 250 MG CAPS Take 1 capsule by mouth 2 (two) times daily.     senna (SENOKOT) 8.6 MG tablet Take 1 tablet by mouth daily as needed for constipation.     No facility-administered medications prior to visit.     Review of Systems:   Constitutional:   No  weight loss, night sweats,  Fevers, chills,  +fatigue, or  lassitude.  HEENT:   No headaches,  Difficulty swallowing,  Tooth/dental problems, or  Sore throat,                No sneezing, itching, ear ache, nasal congestion, post nasal drip,   CV:  No chest pain,  Orthopnea, PND, swelling in lower extremities, anasarca, dizziness, palpitations, syncope.   GI  No heartburn, indigestion, abdominal pain, nausea, vomiting, diarrhea, change in bowel habits, loss of appetite, bloody stools.   Resp:   No chest wall deformity  Skin: no rash or lesions.  GU: no dysuria, change in color of urine, no urgency or frequency.  No flank pain, no hematuria   MS:  No joint pain or swelling.  No decreased range of motion.  No back pain.    Physical Exam  BP 98/60   Pulse 95   Ht '5\' 7"'$  (1.702 m)   Wt 133 lb (60.3 kg)   SpO2 94%   BMI 20.83 kg/m   GEN: A/Ox3; pleasant , NAD, elderly in wheelchair   HEENT:  [place]/AT,   NOSE-clear, THROAT-clear, no lesions, no postnasal drip or exudate noted.   NECK:  Supple w/ fair ROM; no JVD; normal carotid impulses w/o bruits; no thyromegaly or nodules palpated; no lymphadenopathy.    RESP  Clear  P & A; w/o, wheezes/ rales/ or rhonchi. no accessory  muscle use, no dullness to percussion  CARD:  RRR, no m/r/g, no peripheral edema, pulses intact, no cyanosis or clubbing.  GI:   Soft & nt; nml bowel sounds; no organomegaly or masses detected.   Musco: Warm bil, no deformities or joint swelling noted.   Neuro: alert, no focal deficits noted.    Skin: Warm, no lesions or rashes  Lab Results:    BMET   BNP No results found for: "BNP"  ProBNP No results found for: "PROBNP"  Imaging: DG CHEST PORT 1 VIEW  Result Date: 09/17/2021 CLINICAL DATA:  Pneumonia. EXAM: PORTABLE CHEST 1 VIEW COMPARISON:  Chest x-ray 09/15/2021 FINDINGS: Hazy focal airspace opacity in the left upper lobe has slightly increased. Cardiac silhouette is enlarged, unchanged. There is a new small left pleural effusion. There is no pneumothorax or acute fracture. IMPRESSION: 1. Increasing left upper lobe airspace disease. 2. New small left pleural effusion. 3. Stable cardiomegaly. Electronically Signed   By: Ronney Asters M.D.   On: 09/17/2021 16:19   DG Chest Port 1 View  Result Date: 09/15/2021 CLINICAL DATA:  Sepsis.  Cough.  History of bronchitis and dementia. EXAM: PORTABLE CHEST 1 VIEW COMPARISON:  CT chest 06/29/2021 FINDINGS: Cardiac enlargement is again noted and appears similar to the previous exam. No signs of pleural effusion. Asymmetric airspace opacification within the left upper lobe is identified compatible with pneumonia. Thoracolumbar scoliosis and degenerative disc disease. IMPRESSION: 1. Left upper lobe pneumonia. Followup PA and lateral chest X-ray is recommended in 3-4 weeks following trial of antibiotic therapy to ensure resolution and exclude underlying malignancy. 2. Chronic cardiac enlargement. Electronically Signed   By: Kerby Moors M.D.   On: 09/15/2021 05:40          No data to display          No results found for: "NITRICOXIDE"      Assessment & Plan:   No problem-specific Assessment & Plan notes found for this  encounter.     Rexene Edison, NP 09/25/2021

## 2021-09-26 ENCOUNTER — Other Ambulatory Visit: Payer: Self-pay | Admitting: Pulmonary Disease

## 2021-09-26 NOTE — Assessment & Plan Note (Signed)
Recent exacerbation with pneumonia.  Continue with aggressive mucociliary clearance as able.  Patient is unable to do flutter valve due to her dementia.  We will add in liquid Mucinex.  Continue on ICS/LABA. Could consider doing vest therapy if continues to have ongoing flareups.   Plan  Patient Instructions  Chest xray today .  Continue on Advair 2 puffs Twice daily with spacer , rinse after use.  Liquid Mucinex 1 tsp Twice daily  As needed cough/congestion .  Albuterol inhaler As needed  Aspiration precautions  Activity as tolerated  Consider Flu , RSV and Covid booster this Fall as discussed .  Follow up with Dr. Loanne Drilling in 4-6 weeks and As needed   Please contact office for sooner follow up if symptoms do not improve or worsen or seek emergency care

## 2021-09-26 NOTE — Assessment & Plan Note (Signed)
Recent admission with a left upper lobe pneumonia with sepsis.  Clinically patient is improved after antibiotics.  We will check chest x-ray today. Hospital records reviewed in detail  Plan  Patient Instructions  Chest xray today .  Continue on Advair 2 puffs Twice daily with spacer , rinse after use.  Liquid Mucinex 1 tsp Twice daily  As needed cough/congestion .  Albuterol inhaler As needed  Aspiration precautions  Activity as tolerated  Consider Flu , RSV and Covid booster this Fall as discussed .  Follow up with Dr. Loanne Drilling in 4-6 weeks and As needed   Please contact office for sooner follow up if symptoms do not improve or worsen or seek emergency care

## 2021-09-26 NOTE — Progress Notes (Signed)
ATC daughter Haroldine Laws)  x1.  No answer.  VM full.

## 2021-09-30 ENCOUNTER — Encounter: Payer: Self-pay | Admitting: *Deleted

## 2021-09-30 NOTE — Progress Notes (Signed)
ATC x2.  VM full.

## 2021-09-30 NOTE — Progress Notes (Signed)
Unable to reach letter sent

## 2021-10-08 ENCOUNTER — Encounter: Payer: Self-pay | Admitting: Podiatry

## 2021-10-08 ENCOUNTER — Ambulatory Visit (INDEPENDENT_AMBULATORY_CARE_PROVIDER_SITE_OTHER): Payer: Medicare Other | Admitting: Podiatry

## 2021-10-08 DIAGNOSIS — M79675 Pain in left toe(s): Secondary | ICD-10-CM

## 2021-10-08 DIAGNOSIS — M79674 Pain in right toe(s): Secondary | ICD-10-CM | POA: Diagnosis not present

## 2021-10-08 DIAGNOSIS — G629 Polyneuropathy, unspecified: Secondary | ICD-10-CM | POA: Diagnosis not present

## 2021-10-08 DIAGNOSIS — B351 Tinea unguium: Secondary | ICD-10-CM

## 2021-10-13 NOTE — Progress Notes (Signed)
  Subjective:  Patient ID: Bridget [name], female    DOB: Aug 23, 1921,  MRN: [id number]  Bridget [name] presents to clinic today for:  Chief Complaint  Patient presents with   foot care     [place]    New problem(s): None.   PCP is Tisovec, Fransico Him, MD , and last visit was  September 23, 2021.  Allergies  Allergen Reactions   Pulmicort [Budesonide] Swelling    Eye swelling, ankle and knee swelling   Erythromycin Other (See Comments)    Unknown, but "all the mycins bother me"   Roxicodone [Oxycodone] Other (See Comments)    Confusion    Ultram [Tramadol] Other (See Comments)    Confusion    Review of Systems: Negative except as noted in the HPI.  Objective: No changes noted in today's physical examination.  Bridget [name] is a pleasant 88 y.o. female WD, WN in NAD. AAO x 3.  Vascular CFT <3 seconds b/l LE. Palpable DP pulse(s) b/l LE. Palpable PT pulse(s) b/l LE. Pedal hair absent. No pain with calf compression b/l. Lower extremity skin temperature gradient within normal limits. No edema noted b/l LE. No cyanosis or clubbing noted b/l LE.   Neurologic Normal speech. Pt has subjective symptoms of neuropathy. Protective sensation intact 5/5 intact bilaterally with 10g monofilament b/l. Vibratory sensation intact b/l.  Dermatologic Pedal skin thin and atrophic b/l LE. No open wounds b/l LE. No interdigital macerations noted b/l LE. Toenails 1-5 b/l elongated, discolored, dystrophic, thickened, crumbly with subungual debris and tenderness to dorsal palpation. No hyperkeratotic nor porokeratotic lesions present on today's visit.  Orthopedic: Muscle strength 5/5 to all lower extremity muscle groups bilaterally. Hammertoe deformity noted 2-5 b/l.   Assessment/Plan: 1. Pain due to onychomycosis of toenails of both feet   2. Polyneuropathy     No orders of the defined types were placed in this encounter.   -Patient was evaluated and treated. All patient's and/or POA's questions/concerns answered  on today's visit. -Toenails 1-5 b/l were debrided in length and girth with sterile nail nippers and dremel without iatrogenic bleeding.  -Patient/POA to call should there be question/concern in the interim.   Return in about 3 months (around 01/08/2022).  Marzetta Board, DPM

## 2021-10-25 ENCOUNTER — Encounter (INDEPENDENT_AMBULATORY_CARE_PROVIDER_SITE_OTHER): Payer: Medicare Other | Admitting: Ophthalmology

## 2021-10-25 DIAGNOSIS — H353231 Exudative age-related macular degeneration, bilateral, with active choroidal neovascularization: Secondary | ICD-10-CM | POA: Diagnosis not present

## 2021-10-25 DIAGNOSIS — H35033 Hypertensive retinopathy, bilateral: Secondary | ICD-10-CM

## 2021-10-25 DIAGNOSIS — I10 Essential (primary) hypertension: Secondary | ICD-10-CM

## 2021-10-25 DIAGNOSIS — H43813 Vitreous degeneration, bilateral: Secondary | ICD-10-CM

## 2021-11-20 ENCOUNTER — Encounter (HOSPITAL_BASED_OUTPATIENT_CLINIC_OR_DEPARTMENT_OTHER): Payer: Self-pay | Admitting: Pulmonary Disease

## 2021-11-20 ENCOUNTER — Ambulatory Visit (HOSPITAL_BASED_OUTPATIENT_CLINIC_OR_DEPARTMENT_OTHER): Payer: Medicare Other

## 2021-11-20 ENCOUNTER — Ambulatory Visit (INDEPENDENT_AMBULATORY_CARE_PROVIDER_SITE_OTHER): Payer: Medicare Other | Admitting: Pulmonary Disease

## 2021-11-20 VITALS — BP 140/58 | HR 90 | Wt 133.0 lb

## 2021-11-20 DIAGNOSIS — J479 Bronchiectasis, uncomplicated: Secondary | ICD-10-CM | POA: Diagnosis not present

## 2021-11-20 DIAGNOSIS — J189 Pneumonia, unspecified organism: Secondary | ICD-10-CM

## 2021-11-20 MED ORDER — BREZTRI AEROSPHERE 160-9-4.8 MCG/ACT IN AERO: 2.0000 | INHALATION_SPRAY | Freq: Two times a day (BID) | RESPIRATORY_TRACT | 5 refills | Status: DC

## 2021-11-20 NOTE — Progress Notes (Signed)
Subjective:   PATIENT ID: Bridget [name] GENDER: female DOB: [birth year], MRN: [id number]   HPI  Chief Complaint  Patient presents with   Follow-up    Pcp gave sample of breztri in place of advair. Daughter states couldn't tell the difference     Reason for Visit: Follow-up bronchiectasis  Bridget [name] is a 88 year old female former smoker with dementia, atrial fibrillation, left thrombus of atrial appendage, hx breast cancer s/p lumpectomy, osteoporosis and bronchiectasis.  Synopsis: She has previously seen a Pulmonologist 38 years ago for hemoptysis that resolved after initial presentation. Attributed to bronchiectasis and she was treated with a slant board. She has a chronic cough associated with white sputum that equates to at least 2-3 tablespoons. Denies hemoptysis. Denies wheezing or shortness of breath. She has a history of whooping cough as a child. 2022 - Covid in October. Treated for exacerbation in Dec. 2023 - On Advair. Exacerbation in Feb. Trialed nebulizers but returned to Advair with spacer. Hospitalized in Sept for CAP  11/20/21 Daughter provides history. She has had continued significant mucous production for several months and worsened since her pneumonia in September. Has cough, chest congestion, wheezing. Difficulty handling inhalers alone and needing assistance to use spacer due to inconsistent timing.  Social History: Former smoker. Smoked 1/2 ppd x 15 years. Quit in 1950   Past Medical History:  Diagnosis Date   Atherosclerosis of aorta ([facility])    Atrial fibrillation ([place])    Breast cancer ([place])    Chronic Bronchitis    Coronary atherosclerosis due to calcified coronary lesion    Nocturnal polyuria    Stroke (H[facility])    Thrombus of left atrial appendage      Family History  Problem Relation Age of Onset   Other Sister        TRAUMA TO HEAD AFTER A FALL     Social History   Occupational History   Not on file  Tobacco Use   Smoking status:  Former    Packs/day: 1.50    Years: 7.00    Total pack years: 10.50    Types: Cigarettes    Quit date: 1950    Years since quitting: 73.9   Smokeless tobacco: Never  Vaping Use   Vaping Use: Never used  Substance and Sexual Activity   Alcohol use: Never   Drug use: Never   Sexual activity: Not on file    Allergies  Allergen Reactions   Pulmicort [Budesonide] Swelling    Eye swelling, ankle and knee swelling   Erythromycin Other (See Comments)    Unknown, but "all the mycins bother me"   Roxicodone [Oxycodone] Other (See Comments)    Confusion    Ultram [Tramadol] Other (See Comments)    Confusion      Outpatient Medications Prior to Visit  Medication Sig Dispense Refill   acetaminophen (TYLENOL) 500 MG tablet Take 1,000 mg by mouth daily as needed for mild pain or headache.     ADVAIR HFA 115-21 MCG/ACT inhaler INHALE 2 PUFFS INTO THE LUNGS TWICE A DAY 12 each 12   albuterol (VENTOLIN HFA) 108 (90 Base) MCG/ACT inhaler Inhale 2 puffs into the lungs every 6 (six) hours as needed for shortness of breath or wheezing.     B Complex Vitamins (B COMPLEX PO) Take 1 capsule by mouth daily.     Besifloxacin HCl (BESIVANCE) 0.6 % SUSP Place 1 drop into both eyes See admin instructions. Instill 1 drop into  each eye 4 times daily for 2 days after eye injections     D-Mannose Paul Dykes) POWD Take 1 Scoop by mouth daily in the afternoon.     D-MANNOSE PO Take 2 tablets by mouth 2 (two) times daily.     denosumab (PROLIA) 60 MG/ML SOSY injection Inject 60 mg into the skin See admin instructions. Every 6 months     diltiazem (TIAZAC) 180 MG 24 hr capsule Take 180 mg by mouth daily.     docusate sodium (COLACE) 100 MG capsule Take 100 mg by mouth daily as needed for mild constipation.     furosemide (LASIX) 20 MG tablet Take 20 mg by mouth daily as needed for edema.     ketoconazole (NIZORAL) 2 % shampoo Apply 1 Application topically See admin instructions. Apply and lather on skin and let sit  for a few minutes prior to rinsing when you shower     memantine (NAMENDA) 10 MG tablet Take 1 tablet by mouth 2 (two) times daily.     Multiple Vitamins-Minerals (PRESERVISION AREDS 2) CAPS Take 1 capsule by mouth in the morning and at bedtime.     Omega-3 Fatty Acids (FISH OIL PO) Take 15 mLs by mouth at bedtime.     OVER THE COUNTER MEDICATION Take 1 each by mouth in the morning. CBD gummy     OVER THE COUNTER MEDICATION Take 1 each by mouth at bedtime. CBD + melatonin gummy     OVER THE COUNTER MEDICATION Take 1 Scoop by mouth daily. Kalmegh powder supplement     Resveratrol 250 MG CAPS Take 1 capsule by mouth 2 (two) times daily.     senna (SENOKOT) 8.6 MG tablet Take 1 tablet by mouth daily as needed for constipation.     apixaban (ELIQUIS) 5 MG TABS tablet Take 1 tablet (5 mg total) by mouth 2 (two) times daily. 180 tablet 1   No facility-administered medications prior to visit.    Review of Systems  Constitutional:  Negative for chills, diaphoresis, fever, malaise/fatigue and weight loss.  HENT:  Positive for congestion.   Respiratory:  Positive for cough, sputum production, shortness of breath and wheezing. Negative for hemoptysis.   Cardiovascular:  Negative for chest pain, palpitations and leg swelling.     Objective:   Vitals:   11/20/21 1420  BP: (!) 140/58  Pulse: 90  SpO2: 96%  Weight: 133 lb (60.3 kg)     SpO2: 96 % O2 Device: None (Room air)  Physical Exam: General: Elderly, frail-appearing, no acute distress HENT: [place], AT Eyes: EOMI, no scleral icterus Respiratory: Diminished breath sounds bilaterally.  No crackles, wheezing or rales Cardiovascular: RRR, -M/R/G, no JVD Extremities:-Edema,-tenderness Neuro: AAO x2, CNII-XII grossly intact Psych: Normal mood, normal affect   Data Reviewed:  Imaging: CT Chest 08/17/20 - Scattered cylindrical bronchiectasis with interstitium and peribronchovascular ground glass attenuation and micro-nodularity. Complete  atelectasis in the RML. LLL pleural nodules measuring 9 x 37m and 11 x 8 mm nodules CT Chest 12/19/20 - Resolution of pleural nodules in left lower lobe. Interval development of nodular opacities in upper lobes bilaterally. Unchanged atelectasis of RML. CT Chest 02/18/21 - Chronic RML atelectasis. Resolution of upper lobes bilaterally. Of note, improved left atrial thrombus noted. CXR 09/25/21 - Persistent LUL opacity CXR 11/20/21 - Resolved LUL infiltrate, bibasilar atelectasis. Unchanged RML collapse  PFT: None on file  Echo: 09/2020 - EF 50-55%. TV systolic function is reduced. Severely biatrial dilation. Moderate MV, TR  Labs: CBC  Component Value Date/Time   WBC 8.7 09/17/2021 0601   RBC 3.54 (L) 09/17/2021 0601   HGB 12.0 09/17/2021 0601   HGB 13.6 03/25/2021 1602   HCT 36.6 09/17/2021 0601   HCT 40.6 03/25/2021 1602   PLT 149 (L) 09/17/2021 0601   MCV 103.4 (H) 09/17/2021 0601   MCH 33.9 09/17/2021 0601   MCHC 32.8 09/17/2021 0601   RDW 14.0 09/17/2021 0601   LYMPHSABS 1.0 09/16/2021 0705   MONOABS 0.8 09/16/2021 0705   EOSABS 0.0 09/16/2021 0705   BASOSABS 0.0 09/16/2021 0705    Assessment & Plan:   Discussion: 88 year old female former smoker with dementia, atrial fibrillation, left thrombus of atrial appendage, hx breast cancer s/p lumpectomy, osteoporosis who presents for follow-up. Reviewed hospital course Sept 2023. One outpatient and one inpatient exacerbation in 2023. Discussed bronchodilators and difficulty with self-administration. Failed nebs previously due to ?adverse effect (leg swelling). Will step up to triple therapy. Discussed mucous clearance with CPT as patient does not have cognitive ability to participate in pulmonary hygiene and has failed flutter valve, nebs.  Bronchiectasis with chronic bronchitis   Chronic RML atelectasis --STOP Advair --START Breztri TWO puffs TWICE a day --CONTINUE Albuterol as needed for shortness of breath or  wheezing --Failed flutter valve. Patient unable to participate in pulmonary toilet due to cognitive level and insufficient expiratory force. --Failed nebulizer therapy due to edema  --Enroll in smart vest therapy for mucous clearance --Encourage mucinex twice a day as needed. Directions per package --Encourage N-acetylcysteine 600 mg in the morning and evening  LUL infiltrate --Reviewed CXR. LUL infiltrate resolved.  Health Maintenance Immunization History  Administered Date(s) Administered   Influenza, High Dose Seasonal PF 09/06/2017   Influenza, Quadrivalent, Recombinant, Inj, Pf 09/30/2018, 10/31/2019, 09/12/2020   Moderna Sars-Covid-2 Vaccination 01/07/2019, 02/04/2019, 11/18/2019   CT Lung Screen - Not qualified. >15 years after tobacco cessation  Orders Placed This Encounter  Procedures   DG Chest 2 View    Room 12    Standing Status:   Future    Number of Occurrences:   1    Standing Expiration Date:   11/21/2022    Order Specific Question:   Reason for Exam (SYMPTOM  OR DIAGNOSIS REQUIRED)    Answer:   pneumonia    Order Specific Question:   Preferred imaging location?    Answer:   Internal   Ambulatory Referral for DME    Referral Priority:   Routine    Referral Type:   Durable Medical Equipment Purchase    Number of Visits Requested:   1    Meds ordered this encounter  Medications   Budeson-Glycopyrrol-Formoterol (BREZTRI AEROSPHERE) 160-9-4.8 MCG/ACT AERO    Sig: Inhale 2 puffs into the lungs in the morning and at bedtime.    Dispense:  10.7 g    Refill:  5   Return in about 2 months (around 01/20/2022).   I have spent a total time of 32-minutes on the day of the appointment including chart review, data review, collecting history, coordinating care and discussing medical diagnosis and plan with the patient/family. Past medical history, allergies, medications were reviewed. Pertinent imaging, labs and tests included in this note have been reviewed and  interpreted independently by me.  [place], MD [facility] 11/20/2021 2:46 PM  Office Number [phone]

## 2021-11-20 NOTE — Patient Instructions (Addendum)
Bronchiectasis with chronic bronchitis   Chronic RML atelectasis --STOP Advair --START Breztri TWO puffs TWICE a day --CONTINUE Albuterol as needed for shortness of breath or wheezing --Failed flutter valve. Patient unable to participate in pulmonary toilet due to cognitive level and insufficient expiratory force. --Failed nebulizer therapy due to edema  --Enroll in smart vest therapy for mucous clearance --Encourage mucinex twice a day as needed. Directions per package --Encourage N-acetylcysteine 600 mg in the morning and evening  LUL infiltrate --Reviewed CXR. LUL infiltrate resolved.  Follow-up with me in 2 months

## 2021-12-04 LAB — BASIC METABOLIC PANEL
BUN/Creatinine Ratio: 21 (ref 12–28)
BUN: 15 mg/dL (ref 10–36)
CO2: 27 mmol/L (ref 20–29)
Calcium: 9.1 mg/dL (ref 8.7–10.3)
Chloride: 98 mmol/L (ref 96–106)
Creatinine, Ser: 0.72 mg/dL (ref 0.57–1.00)
Glucose: 54 mg/dL — ABNORMAL LOW (ref 70–99)
Potassium: 4.1 mmol/L (ref 3.5–5.2)
Sodium: 137 mmol/L (ref 134–144)
eGFR: 76 mL/min/{1.73_m2} (ref >59)

## 2021-12-04 LAB — HEMOGLOBIN AND HEMATOCRIT, BLOOD
Hematocrit: 42.2 % (ref 34.0–46.6)
Hemoglobin: 14.2 g/dL (ref 11.1–15.9)

## 2021-12-10 ENCOUNTER — Ambulatory Visit: Payer: Medicare Other | Admitting: Cardiology

## 2021-12-10 ENCOUNTER — Encounter: Payer: Self-pay | Admitting: Cardiology

## 2021-12-10 VITALS — BP 120/59 | HR 98 | Resp 14 | Ht 67.0 in | Wt 129.6 lb

## 2021-12-10 DIAGNOSIS — Z87891 Personal history of nicotine dependence: Secondary | ICD-10-CM

## 2021-12-10 DIAGNOSIS — I700 Atherosclerosis of aorta: Secondary | ICD-10-CM

## 2021-12-10 DIAGNOSIS — Z7901 Long term (current) use of anticoagulants: Secondary | ICD-10-CM

## 2021-12-10 DIAGNOSIS — Z8673 Personal history of transient ischemic attack (TIA), and cerebral infarction without residual deficits: Secondary | ICD-10-CM

## 2021-12-10 DIAGNOSIS — I4821 Permanent atrial fibrillation: Secondary | ICD-10-CM

## 2021-12-10 DIAGNOSIS — I513 Intracardiac thrombosis, not elsewhere classified: Secondary | ICD-10-CM

## 2021-12-10 DIAGNOSIS — I2510 Atherosclerotic heart disease of native coronary artery without angina pectoris: Secondary | ICD-10-CM

## 2021-12-10 MED ORDER — APIXABAN 2.5 MG PO TABS: 2.5000 mg | ORAL_TABLET | Freq: Two times a day (BID) | ORAL | 0 refills | Status: DC

## 2021-12-10 NOTE — Progress Notes (Signed)
Date:  12/10/2021   ID:  [name], DOB 12/29/1921, MRN [id number]  PCP:  Haywood Pao, MD  Cardiologist:  Rex Kras, DO, [facility]  (established care 08/28/2020)  Date: 12/10/21 Last Office Visit: 06/13/2021  Chief Complaint  Patient presents with   Atrial Fibrillation   Follow-up    6 month    HPI  Bridget [name] is a 88 y.o. female whose past medical history and cardiovascular risk factors include: Atrial fibrillation (permanent, per daughter), hx of stroke (per daughter in 2017), severe coronary calcification (1809, 94th percentile, CT pulmonary vein protocol June 2023), history of breast cancer, osteoporosis bilateral hip fractures, dementia (per daughter), thrombus of the left atrial appendage (CT scan ), pulmonary nodule, advanced age, post menopausal female.   She is referred to the office at the request of Tisovec, Fransico Him, MD for evaluation of atrial fibrillation.  Patient is accompanied by her daughter Bridget [name] at today's visit who provides collateral history as part of today's office visit.  Known history of permanent atrial fibrillation had a CT scan for chronic cough which noted concerns for left atrial appendage thrombus.  Her anticoagulation was changed from Pradaxa to Eliquis and she is tolerated the change well without any side effects or intolerances.  Given her advanced age and dementia family has appropriately refused to undergo transesophageal echocardiogram to confirm the presence/absence of left atrial appendage thrombus.  She has had repeat CT scans which noted i contrast mixing within the left atrial appendage which may be secondary to incomplete washout versus thrombus.  Patient presents today for 56-monthfollow-up visit.  Patient denies any anginal discomfort or heart failure symptoms.  No evidence of bleeding or falls in the recent past.  Most recent labs from December 03, 2021 independently reviewed.   ALLERGIES: Allergies  Allergen Reactions    Pulmicort [Budesonide] Swelling    Eye swelling, ankle and knee swelling   Erythromycin Other (See Comments)    Unknown, but "all the mycins bother me"   Roxicodone [Oxycodone] Other (See Comments)    Confusion    Ultram [Tramadol] Other (See Comments)    Confusion     MEDICATION LIST PRIOR TO VISIT: Current Meds  Medication Sig   acetaminophen (TYLENOL) 500 MG tablet Take 1,000 mg by mouth daily as needed for mild pain or headache.   albuterol (VENTOLIN HFA) 108 (90 Base) MCG/ACT inhaler Inhale 2 puffs into the lungs every 6 (six) hours as needed for shortness of breath or wheezing.   apixaban (ELIQUIS) 2.5 MG TABS tablet Take 1 tablet (2.5 mg total) by mouth 2 (two) times daily.   B Complex Vitamins (B COMPLEX PO) Take 1 capsule by mouth daily.   Besifloxacin HCl (BESIVANCE) 0.6 % SUSP Place 1 drop into both eyes See admin instructions. Instill 1 drop into each eye 4 times daily for 2 days after eye injections   Budeson-Glycopyrrol-Formoterol (BREZTRI AEROSPHERE) 160-9-4.8 MCG/ACT AERO Inhale 2 puffs into the lungs in the morning and at bedtime.   D-Mannose (Paul Dykes POWD Take 1 Scoop by mouth daily in the afternoon.   D-MANNOSE PO Take 2 tablets by mouth 2 (two) times daily.   denosumab (PROLIA) 60 MG/ML SOSY injection Inject 60 mg into the skin See admin instructions. Every 6 months   diltiazem (TIAZAC) 180 MG 24 hr capsule Take 180 mg by mouth daily.   furosemide (LASIX) 20 MG tablet Take 20 mg by mouth daily as needed for edema.   ketoconazole (NIZORAL) 2 %  shampoo Apply 1 Application topically See admin instructions. Apply and lather on skin and let sit for a few minutes prior to rinsing when you shower   memantine (NAMENDA) 10 MG tablet Take 1 tablet by mouth 2 (two) times daily.   Multiple Vitamins-Minerals (PRESERVISION AREDS 2) CAPS Take 1 capsule by mouth in the morning and at bedtime.   Omega-3 Fatty Acids (FISH OIL PO) Take 15 mLs by mouth at bedtime.   OVER THE COUNTER  MEDICATION Take 1 each by mouth in the morning. CBD gummy   OVER THE COUNTER MEDICATION Take 1 each by mouth at bedtime. CBD + melatonin gummy   OVER THE COUNTER MEDICATION Take 1 Scoop by mouth daily. Kalmegh powder supplement   Resveratrol 250 MG CAPS Take 1 capsule by mouth 2 (two) times daily.   senna (SENOKOT) 8.6 MG tablet Take 1 tablet by mouth daily as needed for constipation.   Turmeric (QC TUMERIC COMPLEX PO) Take by mouth. Unsure of dosage   [DISCONTINUED] apixaban (ELIQUIS) 5 MG TABS tablet Take 1 tablet (5 mg total) by mouth 2 (two) times daily.     PAST MEDICAL HISTORY: Past Medical History:  Diagnosis Date   Atherosclerosis of aorta (HCC)    Atrial fibrillation ([place])    Breast cancer ([place])    Chronic Bronchitis    Coronary atherosclerosis due to calcified coronary lesion    Nocturnal polyuria    Stroke ([place])    Thrombus of left atrial appendage     PAST SURGICAL HISTORY: Past Surgical History:  Procedure Laterality Date   BREAST LUMPECTOMY Left 2001   FEMUR FRACTURE SURGERY Right 2019   RIGHT - 2015   TOTAL ABDOMINAL HYSTERECTOMY  1967    FAMILY HISTORY: The patient family history includes Other in her sister.  SOCIAL HISTORY:  The patient  reports that she quit smoking about 73 years ago. Her smoking use included cigarettes. She has a 10.50 pack-year smoking history. She has never used smokeless tobacco. She reports that she does not drink alcohol and does not use drugs.  REVIEW OF SYSTEMS: Review of Systems  Cardiovascular:  Negative for chest pain, claudication, dyspnea on exertion, leg swelling, orthopnea, palpitations, paroxysmal nocturnal dyspnea and syncope.  Respiratory:  Negative for shortness of breath.   Musculoskeletal:  Positive for joint pain.    PHYSICAL EXAM:    12/10/2021    1:28 PM 11/20/2021    2:20 PM 09/25/2021   11:33 AM  Vitals with BMI  Height _0   _1   Weight 129 lbs 10 oz 133 lbs 133 lbs  BMI 00.76  22.63  Systolic 335  456 98  Diastolic 59 58 60  Pulse 98 90 95    CONSTITUTIONAL: Age-appropriate female, presents in a wheelchair, hemodynamically stable, frail, no acute distress.  SKIN: Skin is warm and dry. No rash noted. No cyanosis. No pallor. No jaundice HEAD: Normocephalic and atraumatic.  EYES: No scleral icterus MOUTH/THROAT: Moist oral membranes.  NECK: No JVD present. No thyromegaly noted. No carotid bruits  CHEST Normal respiratory effort. No intercostal retractions  LUNGS: Clear to auscultation bilaterally.  No stridor. No wheezes. No rales.  CARDIOVASCULAR: Irregularly irregular, variable S1-S2, no gallops or rubs. ABDOMINAL: Soft, nontender, nondistended, positive bowel sounds in all 4 quadrants, no apparent ascites.  EXTREMITIES: No peripheral edema, 2+ DP and PT pulses. HEMATOLOGIC: No significant bruising NEUROLOGIC: Oriented to person, place, and time. Nonfocal. Normal muscle tone.  PSYCHIATRIC: Normal mood and affect. Normal behavior. Cooperative  RADIOLOGY: CT chest with contrast: 08/17/2020 IMPRESSION: 1. Scattered areas of bronchiectasis are noted in the lungs bilaterally with associated chronic scarring/atelectasis in the right middle lobe. Notably, there also 2 large pulmonary nodules in the posterior aspect of the left lower lobe. These are favored to be of infectious or inflammatory etiology, but underlying neoplasm is difficult to entirely exclude. Accordingly, short-term repeat chest CT is recommended 1-2 months to ensure the stability or regression of these findings. 2. Cardiomegaly with biatrial dilatation. Notably, there is a large filling defect in the tip of the left atrial appendage highly concerning for left atrial appendage thrombus. If present, this places the patient at high risk for systemic embolization. Further evaluation with transesophageal echocardiography is recommended if clinically appropriate. 3. Trace left pleural effusion lying dependently. 4. Aortic  atherosclerosis, in addition to left main and 3 vessel coronary artery disease. 5. There are calcifications of the aortic valve and mitral annulus. Echocardiographic correlation for evaluation of potential valvular dysfunction may be warranted if clinically indicated.  CT chest without contrast 12/21/2020: 1. Pulmonary nodules in the posterior left lower lobe have resolved since 08/17/2020 and compatible with resolved infection or inflammation. However, there are multiple new areas of airspace disease throughout both lungs, most prominent in the upper lobes. Findings are most compatible with infectious or inflammatory changes. In addition, there are scattered areas of bronchiectasis with areas of mucoid impaction. 2. Chronic collapse of the right middle lobe. 3. Cardiomegaly. 4.  Aortic Atherosclerosis (ICD10-I70.0).  CARDIAC DATABASE: EKG: 12/10/2021: Atrial fibrillation, 85 bpm, right bundle branch block.  Echocardiogram: 09/13/2020: LVEF 50-55%, no regional wall motion abnormalities, diastolic dysfunction not evaluated due to underlying rhythm being atrial fibrillation, biatrial dilatation, moderate MR, moderate TR, no pulmonary hypertension, aortic plaque within the descending aorta.   LABORATORY DATA: External Labs: Collected: 05/02/2020 Creatinine 0.8 mg/dL. eGFR: 60m/min per 1.73 m AST 18, ALT 10, alkaline phosphatase 61 Hemoglobin 13.3 g/dL, hematocrit 39.7  Collected: 10/31/2019: Hemoglobin 14.6 g/dL, hematocrit 44.2%  IMPRESSION:    ICD-10-CM   1. Permanent atrial fibrillation (HCC)  I48.21 EKG 12-Lead    apixaban (ELIQUIS) 2.5 MG TABS tablet    2. Long term (current) use of anticoagulants  Z79.01 apixaban (ELIQUIS) 2.5 MG TABS tablet    3. Thrombus of left atrial appendage  I51.3 apixaban (ELIQUIS) 2.5 MG TABS tablet    4. History of stroke  Z86.73     5. Coronary atherosclerosis due to calcified coronary lesion  I25.10    I25.84     6. Atherosclerosis of aorta  (HCC)  I70.0     7. Former smoker  Z87.891       RECOMMENDATIONS: HTKEYA STENCILis a 88y.o. female whose past medical history and cardiac risk factors include: Atrial fibrillation (permanent, per daughter), hx of stroke (per daughter in 2017), severe coronary calcification (1809, 94th percentile, CT pulmonary vein protocol June 2023), history of breast cancer, osteoporosis bilateral hip fractures, dementia (per daughter), thrombus of the left atrial appendage (CT scan ), pulmonary nodule, advanced age, post menopausal female.   Permanent atrial fibrillation (HCC) Rate control: Diltiazem. Rhythm control: N/A. Thromboembolic prophylaxis: Eliquis EKG illustrates atrial fibrillation with controlled ventricular rate. Does not endorse evidence of bleeding/fall. Labs from November 2023 independently reviewed and noted above for further reference.  Long term (current) use of anticoagulants Indication: Permanent atrial fibrillation and presumed left atrial appendage thrombus. Transition from Pradaxa to Eliquis. CHA2DS2-VASc SCORE is 6 which correlates to 9.8% risk of stroke per  year (HTN, age, history of stroke, gender).  Her weight is 58.8kg and age >52 so will decrease Eliquis from 78m bid to 2.565mpo bid.  Patient and her daughter do not endorse evidence of bleeding. Most recent labs from November 25, 2021 notes stable hemoglobin and renal function.  Thrombus of left atrial appendage Presumed The degree of filling defect has improved since the initial study in April 2022.  Continue current medical therapy. Patient has tolerated Eliquis well and the degree of the filling defect continues to improve study after study. Given her age, frailty, this shared decision was to hold off on additional CT scans as well as TEE to reevaluate the thrombus size. Patient and daughter are aware that if she has embolization of the thrombus it can lead to TIA/stroke symptoms.   Coronary artery atherosclerosis  due to calcified plaque and atherosclerosis of the aorta: Currently not on aspirin as she is on oral anticoagulation -to help minimize the risk of bleeding. Patient and daughter does not want to start statin -which is reasonable due to her advanced age and underlying dementia. They like to hold off on ischemic workup for similar reasons as well. Monitor for now.  FINAL MEDICATION LIST END OF ENCOUNTER: Meds ordered this encounter  Medications   apixaban (ELIQUIS) 2.5 MG TABS tablet    Sig: Take 1 tablet (2.5 mg total) by mouth 2 (two) times daily.    Dispense:  180 tablet    Refill:  0     Medications Discontinued During This Encounter  Medication Reason   docusate sodium (COLACE) 100 MG capsule    apixaban (ELIQUIS) 5 MG TABS tablet Dose change      Current Outpatient Medications:    acetaminophen (TYLENOL) 500 MG tablet, Take 1,000 mg by mouth daily as needed for mild pain or headache., Disp: , Rfl:    albuterol (VENTOLIN HFA) 108 (90 Base) MCG/ACT inhaler, Inhale 2 puffs into the lungs every 6 (six) hours as needed for shortness of breath or wheezing., Disp: , Rfl:    apixaban (ELIQUIS) 2.5 MG TABS tablet, Take 1 tablet (2.5 mg total) by mouth 2 (two) times daily., Disp: 180 tablet, Rfl: 0   B Complex Vitamins (B COMPLEX PO), Take 1 capsule by mouth daily., Disp: , Rfl:    Besifloxacin HCl (BESIVANCE) 0.6 % SUSP, Place 1 drop into both eyes See admin instructions. Instill 1 drop into each eye 4 times daily for 2 days after eye injections, Disp: , Rfl:    Budeson-Glycopyrrol-Formoterol (BREZTRI AEROSPHERE) 160-9-4.8 MCG/ACT AERO, Inhale 2 puffs into the lungs in the morning and at bedtime., Disp: 10.7 g, Rfl: 5   D-Mannose (URITRAX) POWD, Take 1 Scoop by mouth daily in the afternoon., Disp: , Rfl:    D-MANNOSE PO, Take 2 tablets by mouth 2 (two) times daily., Disp: , Rfl:    denosumab (PROLIA) 60 MG/ML SOSY injection, Inject 60 mg into the skin See admin instructions. Every 6 months,  Disp: , Rfl:    diltiazem (TIAZAC) 180 MG 24 hr capsule, Take 180 mg by mouth daily., Disp: , Rfl:    furosemide (LASIX) 20 MG tablet, Take 20 mg by mouth daily as needed for edema., Disp: , Rfl:    ketoconazole (NIZORAL) 2 % shampoo, Apply 1 Application topically See admin instructions. Apply and lather on skin and let sit for a few minutes prior to rinsing when you shower, Disp: , Rfl:    memantine (NAMENDA) 10 MG tablet, Take 1  tablet by mouth 2 (two) times daily., Disp: , Rfl:    Multiple Vitamins-Minerals (PRESERVISION AREDS 2) CAPS, Take 1 capsule by mouth in the morning and at bedtime., Disp: , Rfl:    Omega-3 Fatty Acids (FISH OIL PO), Take 15 mLs by mouth at bedtime., Disp: , Rfl:    OVER THE COUNTER MEDICATION, Take 1 each by mouth in the morning. CBD gummy, Disp: , Rfl:    OVER THE COUNTER MEDICATION, Take 1 each by mouth at bedtime. CBD + melatonin gummy, Disp: , Rfl:    OVER THE COUNTER MEDICATION, Take 1 Scoop by mouth daily. Kalmegh powder supplement, Disp: , Rfl:    Resveratrol 250 MG CAPS, Take 1 capsule by mouth 2 (two) times daily., Disp: , Rfl:    senna (SENOKOT) 8.6 MG tablet, Take 1 tablet by mouth daily as needed for constipation., Disp: , Rfl:    Turmeric (QC TUMERIC COMPLEX PO), Take by mouth. Unsure of dosage, Disp: , Rfl:   Orders Placed This Encounter  Procedures   EKG 12-Lead    There are no Patient Instructions on file for this visit.   --Continue cardiac medications as reconciled in final medication list. --Return in about 6 months (around 06/11/2022) for Follow up, A. fib. Or sooner if needed. --Continue follow-up with your primary care physician regarding the management of your other chronic comorbid conditions.  Patient's questions and concerns were addressed to her satisfaction. She voices understanding of the instructions provided during this encounter.   This note was created using a voice recognition software as a result there may be grammatical errors  inadvertently enclosed that do not reflect the nature of this encounter. Every attempt is made to correct such errors.  Rex Kras, [place], [facility]  Pager: [phone] Office: [phone]

## 2021-12-12 ENCOUNTER — Other Ambulatory Visit (HOSPITAL_COMMUNITY): Payer: Self-pay

## 2021-12-13 ENCOUNTER — Ambulatory Visit (HOSPITAL_COMMUNITY)
Admission: RE | Admit: 2021-12-13 | Discharge: 2021-12-13 | Disposition: A | Discharge status: Home / Self Care | Payer: Medicare Other | Source: Ambulatory Visit | Attending: Internal Medicine | Admitting: Internal Medicine

## 2021-12-13 DIAGNOSIS — M810 Age-related osteoporosis without current pathological fracture: Secondary | ICD-10-CM | POA: Diagnosis present

## 2021-12-13 MED ORDER — DENOSUMAB 60 MG/ML ~~LOC~~ SOSY
PREFILLED_SYRINGE | SUBCUTANEOUS | Status: AC
  Filled 2021-12-13: qty 1

## 2021-12-13 MED ORDER — DENOSUMAB 60 MG/ML ~~LOC~~ SOSY
60.0000 mg | PREFILLED_SYRINGE | Freq: Once | SUBCUTANEOUS | Status: AC
  Administered 2021-12-13: 60 mg via SUBCUTANEOUS

## 2021-12-26 ENCOUNTER — Telehealth: Payer: Self-pay | Admitting: Pulmonary Disease

## 2021-12-26 NOTE — Telephone Encounter (Signed)
PT's daughter calling. Mother has AFIB and dementia. She has been on O2 Therapy with settings at 6-7-8 for 5 days but possibly the vibration of the machine is aggravating or the pressure (6 @ 20%, 7 @ ...etc)  the mom and she is complaining about it. Should settings be lowered? It seems to be working and Daughter wonders if she should continue w/O2 setting or can she lower. Can she do once a day rather than 2?  Possibly Dr. Loanne Drilling and her Heart Dr. Terri Skains) need to consult w/each other? Please call to advise.[phone]

## 2021-12-27 NOTE — Telephone Encounter (Signed)
Attempted to contact listed number [phone] x 2. Left voicemail identifying myself to discuss question regarding oxygen.  If patient or daughter returns call please ask the following questions, 1)what is her current O2 liters per minute? 2)what is her SpO2 readings on the above setting? 3)Dr. Loanne Drilling will attempt to call again later in the day

## 2021-12-27 NOTE — Telephone Encounter (Signed)
Called again this afternoon. No answer. Left voicemail. Please offer patient clinic visit at [address] as [facility] will be out-of-office next week.

## 2021-12-31 NOTE — Telephone Encounter (Signed)
No openings any this week at either [facility] or [facility] location.    Called and spoke with pt's daughter Bridget [name] to ask her about the info per Dr. Loanne Drilling. When speaking with Bridget [name], she said that pt does not wear any oxygen.  Pt wears a smart vest and is supposed to be wearing it twice a day but states that pt has only been wearing it once a day. Pt has been complaining about her heart and also stating that she cannot breathe from the vibrations of the vest when wearing it. The settings are at 6-7-8.  Bridget [name] wonders if the smart vest settings might need to be adjusted.  Dr. Loanne Drilling, please advise on this.

## 2022-01-01 ENCOUNTER — Telehealth: Payer: Self-pay

## 2022-01-01 NOTE — Telephone Encounter (Signed)
Margaretha Seeds, MD  [facility] Triage Pool9 hours ago (10:51 PM)    Please advise patient to stop using device until an appointment can be made with me.  Please schedule for 30 min at next available visit at [place]  Once that visit is made please contact the smart vest rep to be available for that appointment day and time so we can provide direct teaching to patient and family.  Thanks, JE   Called and spoke with pt's daughter Elsie Amis letting her know the info per JE and she verbalized understanding. Pt had an appt scheduled with JE but I have rescheduled pt for a sooner appt that is in a 54mn time slot.   Called and spoke with VSanjuana Kavaat [phone] our smart vest rep about pt and the request from Dr. ELoanne Drilling VAdonis Hugueninsaid that she was going to have to check with her supervisor to see if they could arrange to have [place] to provide direct teaching to pt and family. I let VAdonis Hugueninknow that pt was told to stop wearing the vest by JE until pt has her appt and she verbalized understanding.  Once VAdonis Hugueninhad an answer, she said she was going to reach out to [place]/Dr. ELoanne Drillingat the [place] to let them know what she finds out if she is able to get it arranged for a rep to be there for pt's appt.  Routing to Dr. ELoanne Drillingas an FJuluis Rainier

## 2022-01-01 NOTE — Telephone Encounter (Signed)
Daughter calling with questions in regards to her smart vest (Prescribed by pulmonologist). She is concerned about her moms A-fib and if the vest is safe for her to use. The patient complains of shortness of breathe and that "her heart can't take the squeezing".   Please advise and I will call the daughter back. Call back number: [phone] Bridget [name])

## 2022-01-01 NOTE — Telephone Encounter (Signed)
Please have her reach out to her pulmonary provider for further guidance.   Sachin Ferencz Sorento, DO, [facility]

## 2022-01-03 NOTE — Telephone Encounter (Signed)
Spoke with the daughter and she said she has set up an appointment with pulmonology to discuss this further.

## 2022-01-08 ENCOUNTER — Telehealth: Payer: Self-pay | Admitting: Pulmonary Disease

## 2022-01-08 MED ORDER — PREDNISONE 10 MG PO TABS: ORAL_TABLET | ORAL | 0 refills | Status: DC

## 2022-01-08 MED ORDER — AZITHROMYCIN 250 MG PO TABS: ORAL_TABLET | ORAL | 0 refills | Status: DC

## 2022-01-08 NOTE — Telephone Encounter (Signed)
Daughter calling for PT. PT was released from hospital recently for pneumonia and has a very bad cough w/color. PT's daughter is asking for Dr. Loanne Drilling or nurse to [facility] to advise. [phone]

## 2022-01-08 NOTE — Telephone Encounter (Signed)
Appointment rescheduled to 01/27/22 when smartvest rep is available.

## 2022-01-08 NOTE — Telephone Encounter (Signed)
Her daughter reports worsening respiratory in the last 2-3 days including shortness of breath, new wheezing and productive cough. No fever. Granddaughter was recently ill with sore throat.  Bronchiectasis exacerbation --Prednisone taper --Azithromycin. This was tolerated while she was inpatient  Staff - please cancel her acute clinic visit with NP [facility] on 01/09/21.

## 2022-01-09 ENCOUNTER — Ambulatory Visit: Payer: Medicare Other | Admitting: Nurse Practitioner

## 2022-01-09 NOTE — Telephone Encounter (Signed)
Pt's acute visit with [facility] has been cancelled. Nothing furhter needed.

## 2022-01-10 ENCOUNTER — Ambulatory Visit (HOSPITAL_BASED_OUTPATIENT_CLINIC_OR_DEPARTMENT_OTHER): Payer: Medicare Other | Admitting: Pulmonary Disease

## 2022-01-16 ENCOUNTER — Ambulatory Visit (INDEPENDENT_AMBULATORY_CARE_PROVIDER_SITE_OTHER)
Admission: RE | Admit: 2022-01-16 | Discharge: 2022-01-16 | Disposition: A | Discharge status: Home / Self Care | Payer: Medicare Other | Source: Ambulatory Visit | Attending: Adult Health | Admitting: Adult Health

## 2022-01-16 ENCOUNTER — Encounter: Payer: Self-pay | Admitting: Adult Health

## 2022-01-16 ENCOUNTER — Telehealth: Payer: Self-pay | Admitting: Pulmonary Disease

## 2022-01-16 ENCOUNTER — Ambulatory Visit (INDEPENDENT_AMBULATORY_CARE_PROVIDER_SITE_OTHER): Payer: Medicare Other | Admitting: Adult Health

## 2022-01-16 VITALS — BP 102/50 | HR 92 | Temp 98.5°F | Ht 67.0 in | Wt 131.0 lb

## 2022-01-16 DIAGNOSIS — R051 Acute cough: Secondary | ICD-10-CM

## 2022-01-16 DIAGNOSIS — J479 Bronchiectasis, uncomplicated: Secondary | ICD-10-CM | POA: Diagnosis not present

## 2022-01-16 MED ORDER — BENZONATATE 200 MG PO CAPS: 200.0000 mg | ORAL_CAPSULE | Freq: Three times a day (TID) | ORAL | 1 refills | Status: DC | PRN

## 2022-01-16 NOTE — Telephone Encounter (Signed)
Dr. Asked to leave msg for Dr. Loanne Drilling. PT is done with Medication round she sent in for her but is still sick. Pls call to advise. [phone]

## 2022-01-16 NOTE — Patient Instructions (Addendum)
Chest xray today.  Continue on Breztri 2 puffs Twice daily, rinse after use.  Liquid Mucinex 1 tsp Twice daily  As needed cough/congestion .  Tessalon Three times a day  As needed  cough  Albuterol inhaler As needed  Aspiration precautions  Activity as tolerated  Follow up with Dr. Loanne Drilling as planned and As needed   Please contact office for sooner follow up if symptoms do not improve or worsen or seek emergency care

## 2022-01-16 NOTE — Telephone Encounter (Signed)
Called and spoke with pt's daughter Bridget [name] stating to her that pt needs to be seen for an appt since she was still sick after recent meds. She verbalized understanding. Appt scheduled for pt at 3pm today 1/11 with TP. Made sure Bridget [name] was aware that appt was going to be at [facility] location. Bridget [name] stated that she was also currently sick so pt's granddaughter was going to be bringing pt in. Nothing further needed.

## 2022-01-16 NOTE — Assessment & Plan Note (Signed)
Mild flare slowly resolving.  Does seem to be getting better we will hold on additional steroids and antibiotics at this time.  Check chest x-ray today. Add Tessalon to help with cough control.  Continue on Breztri.  Plan  Patient Instructions  Chest xray today.  Continue on Breztri 2 puffs Twice daily, rinse after use.  Liquid Mucinex 1 tsp Twice daily  As needed cough/congestion .  Tessalon Three times a day  As needed  cough  Albuterol inhaler As needed  Aspiration precautions  Activity as tolerated  Follow up with Dr. Loanne Drilling as planned and As needed   Please contact office for sooner follow up if symptoms do not improve or worsen or seek emergency care

## 2022-01-16 NOTE — Progress Notes (Signed)
$'@Patient'A$  ID: Bridget [name], female    DOB: [birth year], 89 y.o.   MRN: [id number]  Chief Complaint  Patient presents with   Acute Visit    Referring provider: Haywood Pao, MD  HPI: 89 year old female followed for bronchiectasis and chronic bronchitis Medical history significant for dementia, atrial fibrillation, thrombus of the left atrial appendage  TEST/EVENTS :   01/16/2022 Acute OV : Cough  Patient presents for an acute office visit.  Patient is accompanied by her family member.  Patient called into the office on January 08, 2022 with 2 to 3-day history of cough, congestion, wheezing.  She was called in a Z-Pak and prednisone.  Patient was coughing up some yellow mucus and this has improved.  Multiple family members had similar symptoms.  She did not test for COVID-19. Symptoms are improving however cough is continuing.  Cough is severe at times and keeping her up at night    Allergies  Allergen Reactions   Pulmicort [Budesonide] Swelling    Eye swelling, ankle and knee swelling   Erythromycin Other (See Comments)    Unknown, but "all the mycins bother me"   Roxicodone [Oxycodone] Other (See Comments)    Confusion    Ultram [Tramadol] Other (See Comments)    Confusion     Immunization History  Administered Date(s) Administered   Fluad Quad(high Dose 65+) 10/04/2021   Influenza, High Dose Seasonal PF 09/06/2017   Influenza, Quadrivalent, Recombinant, Inj, Pf 09/30/2018, 10/31/2019, 09/12/2020   Moderna Sars-Covid-2 Vaccination 01/07/2019, 02/04/2019, 11/18/2019   Pneumococcal Polysaccharide-23 12/17/2021    Past Medical History:  Diagnosis Date   Atherosclerosis of aorta ([place])    Atrial fibrillation ([place])    Breast cancer ([place])    Chronic Bronchitis    Coronary atherosclerosis due to calcified coronary lesion    Nocturnal polyuria    Stroke (HCC)    Thrombus of left atrial appendage     Tobacco History: Social History   Tobacco Use  Smoking Status  Former   Packs/day: 1.50   Years: 7.00   Total pack years: 10.50   Types: Cigarettes   Quit date: 1950   Years since quitting: 74.0  Smokeless Tobacco Never   Counseling given: Not Answered   Outpatient Medications Prior to Visit  Medication Sig Dispense Refill   acetaminophen (TYLENOL) 500 MG tablet Take 1,000 mg by mouth daily as needed for mild pain or headache.     albuterol (VENTOLIN HFA) 108 (90 Base) MCG/ACT inhaler Inhale 2 puffs into the lungs every 6 (six) hours as needed for shortness of breath or wheezing.     apixaban (ELIQUIS) 2.5 MG TABS tablet Take 1 tablet (2.5 mg total) by mouth 2 (two) times daily. 180 tablet 0   B Complex Vitamins (B COMPLEX PO) Take 1 capsule by mouth daily.     Besifloxacin HCl (BESIVANCE) 0.6 % SUSP Place 1 drop into both eyes See admin instructions. Instill 1 drop into each eye 4 times daily for 2 days after eye injections     Budeson-Glycopyrrol-Formoterol (BREZTRI AEROSPHERE) 160-9-4.8 MCG/ACT AERO Inhale 2 puffs into the lungs in the morning and at bedtime. 10.7 g 5   D-Mannose (URITRAX) POWD Take 1 Scoop by mouth daily in the afternoon.     D-MANNOSE PO Take 2 tablets by mouth 2 (two) times daily.     denosumab (PROLIA) 60 MG/ML SOSY injection Inject 60 mg into the skin See admin instructions. Every 6 months     diltiazem (  TIAZAC) 180 MG 24 hr capsule Take 180 mg by mouth daily.     furosemide (LASIX) 20 MG tablet Take 20 mg by mouth daily as needed for edema.     ketoconazole (NIZORAL) 2 % shampoo Apply 1 Application topically See admin instructions. Apply and lather on skin and let sit for a few minutes prior to rinsing when you shower     memantine (NAMENDA) 10 MG tablet Take 1 tablet by mouth 2 (two) times daily.     Multiple Vitamins-Minerals (PRESERVISION AREDS 2) CAPS Take 1 capsule by mouth in the morning and at bedtime.     Omega-3 Fatty Acids (FISH OIL PO) Take 15 mLs by mouth at bedtime.     OVER THE COUNTER MEDICATION Take 1 each  by mouth in the morning. CBD gummy     OVER THE COUNTER MEDICATION Take 1 each by mouth at bedtime. CBD + melatonin gummy     OVER THE COUNTER MEDICATION Take 1 Scoop by mouth daily. Kalmegh powder supplement     Resveratrol 250 MG CAPS Take 1 capsule by mouth 2 (two) times daily.     senna (SENOKOT) 8.6 MG tablet Take 1 tablet by mouth daily as needed for constipation.     Turmeric (QC TUMERIC COMPLEX PO) Take by mouth. Unsure of dosage     azithromycin (ZITHROMAX) 250 MG tablet Take two tablets on day 1, then one tablet daily on day 2-5. (Patient not taking: Reported on 01/16/2022) 6 tablet 0   predniSONE (DELTASONE) 10 MG tablet Take 4 tablets (40 mg total) by mouth daily with breakfast for 2 days, THEN 3 tablets (30 mg total) daily with breakfast for 2 days, THEN 2 tablets (20 mg total) daily with breakfast for 2 days, THEN 1 tablet (10 mg total) daily with breakfast for 2 days. (Patient not taking: Reported on 01/16/2022) 20 tablet 0   No facility-administered medications prior to visit.     Review of Systems:   Constitutional:   No  weight loss, night sweats,  Fevers, chills, + fatigue, or  lassitude.  HEENT:   No headaches,  Difficulty swallowing,  Tooth/dental problems, or  Sore throat,                No sneezing, itching, ear ache,  +nasal congestion, post nasal drip,   CV:  No chest pain,  Orthopnea, PND, swelling in lower extremities, anasarca, dizziness, palpitations, syncope.   GI  No heartburn, indigestion, abdominal pain, nausea, vomiting, diarrhea, change in bowel habits, loss of appetite, bloody stools.   Resp: No shortness of breath with exertion or at rest.  No excess mucus, no productive cough,  No non-productive cough,  No coughing up of blood.  No change in color of mucus.  No wheezing.  No chest wall deformity  Skin: no rash or lesions.  GU: no dysuria, change in color of urine, no urgency or frequency.  No flank pain, no hematuria   MS:  No joint pain or  swelling.  No decreased range of motion.  No back pain.    Physical Exam  BP (!) 102/50 (BP Location: Right Arm, Patient Position: Sitting, Cuff Size: Normal)   Pulse 92   Temp 98.5 F (36.9 C) (Oral)   Ht '5\' 7"'$  (1.702 m)   Wt 131 lb (59.4 kg)   SpO2 93%   BMI 20.52 kg/m   GEN: A/Ox3; pleasant , NAD, frail and elderly   HEENT:  [place]/AT,   NOSE-clear,  THROAT-clear, no lesions, no postnasal drip or exudate noted.   NECK:  Supple w/ fair ROM; no JVD; normal carotid impulses w/o bruits; no thyromegaly or nodules palpated; no lymphadenopathy.    RESP  Clear  P & A; w/o, wheezes/ rales/ or rhonchi. no accessory muscle use, no dullness to percussion  CARD:  RRR, no m/r/g, no peripheral edema, pulses intact, no cyanosis or clubbing.  GI:   Soft & nt; nml bowel sounds; no organomegaly or masses detected.   Musco: Warm bil, no deformities or joint swelling noted.   Neuro: alert, no focal deficits noted.    Skin: Warm, no lesions or rashes    Lab Results:    BNP No results found for: "BNP"  ProBNP No results found for: "PROBNP"  Imaging: DG Chest 2 View  Result Date: 01/16/2022 CLINICAL DATA:  Cough. EXAM: CHEST - 2 VIEW COMPARISON:  November 20, 2021. FINDINGS: Stable cardiomegaly.  Lungs are clear.  Bony thorax is unremarkable. IMPRESSION: No active cardiopulmonary disease. Electronically Signed   By: Marijo Conception M.D.   On: 01/16/2022 16:25          No data to display          No results found for: "NITRICOXIDE"      Assessment & Plan:   Bronchiectasis without complication (HCC) Mild flare slowly resolving.  Does seem to be getting better we will hold on additional steroids and antibiotics at this time.  Check chest x-ray today. Add Tessalon to help with cough control.  Continue on Breztri.  Plan  Patient Instructions  Chest xray today.  Continue on Breztri 2 puffs Twice daily, rinse after use.  Liquid Mucinex 1 tsp Twice daily  As needed  cough/congestion .  Tessalon Three times a day  As needed  cough  Albuterol inhaler As needed  Aspiration precautions  Activity as tolerated  Follow up with Dr. Loanne Drilling as planned and As needed   Please contact office for sooner follow up if symptoms do not improve or worsen or seek emergency care         Rexene Edison, NP 01/16/2022

## 2022-01-17 ENCOUNTER — Telehealth: Payer: Self-pay | Admitting: Pulmonary Disease

## 2022-01-17 NOTE — Telephone Encounter (Signed)
Results given.

## 2022-01-17 NOTE — Telephone Encounter (Signed)
Patients daughter calling in regards to chest xray results. Tammy can you please advise?

## 2022-01-17 NOTE — Telephone Encounter (Signed)
Daughter calling for results of Xray of Lungs. (Saw Tammy)  Pls call @ [phone] DAughter

## 2022-01-22 ENCOUNTER — Ambulatory Visit (HOSPITAL_BASED_OUTPATIENT_CLINIC_OR_DEPARTMENT_OTHER): Payer: Medicare Other | Admitting: Pulmonary Disease

## 2022-01-23 ENCOUNTER — Other Ambulatory Visit: Payer: Self-pay | Admitting: Pulmonary Disease

## 2022-01-27 ENCOUNTER — Encounter (HOSPITAL_BASED_OUTPATIENT_CLINIC_OR_DEPARTMENT_OTHER): Payer: Self-pay | Admitting: Pulmonary Disease

## 2022-01-27 ENCOUNTER — Ambulatory Visit (INDEPENDENT_AMBULATORY_CARE_PROVIDER_SITE_OTHER): Payer: Medicare Other | Admitting: Pulmonary Disease

## 2022-01-27 VITALS — BP 118/64 | HR 81 | Ht 67.0 in | Wt 131.0 lb

## 2022-01-27 DIAGNOSIS — J479 Bronchiectasis, uncomplicated: Secondary | ICD-10-CM | POA: Diagnosis not present

## 2022-01-27 NOTE — Patient Instructions (Addendum)
Bronchiectasis with chronic bronchitis   Chronic RML atelectasis --CONTINUE Breztri TWO puffs TWICE a day --CONTINUE Albuterol as needed for shortness of breath or wheezing --Continue smart vest therapy for mucous clearance. Up to twice a day --Encourage mucinex twice a day as needed. Directions per package --Encourage N-acetylcysteine (NAC) 600 mg in the morning and evening  Chest vest education provided today  Follow-up with me in 3 months

## 2022-01-27 NOTE — Progress Notes (Signed)
Subjective:   PATIENT ID: Bridget [name] GENDER: female DOB: [birth year], MRN: [id number]   HPI  Chief Complaint  Patient presents with   Follow-up    Pt has improved since last office visit.    Reason for Visit: Follow-up bronchiectasis  Ms. Bridget [name] is a 89 year old female former smoker with dementia, atrial fibrillation, left thrombus of atrial appendage, hx breast cancer s/p lumpectomy, osteoporosis and bronchiectasis.  Synopsis: She has previously seen a Pulmonologist 38 years ago for hemoptysis that resolved after initial presentation. Attributed to bronchiectasis and she was treated with a slant board. She has a chronic cough associated with white sputum that equates to at least 2-3 tablespoons. Denies hemoptysis. Denies wheezing or shortness of breath. She has a history of whooping cough as a child. 2022 - Covid in October. Treated for exacerbation in Dec. 2023 - On Advair. Outpatient exacerbation in Feb. Trialed nebulizers but returned to Advair with spacer. Hospitalized in Sept for CAP. Started on [place] in Nov. Ordered smart vest for significant mucous production that has worsened since her hospitalization. Difficulty handling inhalers alone and needing assistance to use spacer due to inconsistent timing.  01/27/22 Daughter and granddaughter provides history. Since our last visit patient/family called in regarding appropriate settings for chest vest. Currently on 6-7-8 and limiting to daily use. She was also treated for a bronchiectasis exacerbation with prednisone and azithro in early January however returned to clinic for persistent symptoms but noted to be overall improving so no antibiotics/steroids given. Patient at baseline with shortness of breath, wheezing, produces significant mucous twice a day. Still having chest congestion.  Social History: Former smoker. Smoked 1/2 ppd x 15 years. Quit in 1950   Past Medical History:  Diagnosis Date   Atherosclerosis of aorta  (HCC)    Atrial fibrillation ([place])    Breast cancer ([place])    Chronic Bronchitis    Coronary atherosclerosis due to calcified coronary lesion    Nocturnal polyuria    Stroke (HCC)    Thrombus of left atrial appendage      Family History  Problem Relation Age of Onset   Other Sister        TRAUMA TO HEAD AFTER A FALL     Social History   Occupational History   Not on file  Tobacco Use   Smoking status: Former    Packs/day: 1.50    Years: 7.00    Total pack years: 10.50    Types: Cigarettes    Quit date: 1950    Years since quitting: 74.1   Smokeless tobacco: Never  Vaping Use   Vaping Use: Never used  Substance and Sexual Activity   Alcohol use: Never   Drug use: Never   Sexual activity: Not on file    Allergies  Allergen Reactions   Pulmicort [Budesonide] Swelling    Eye swelling, ankle and knee swelling   Erythromycin Other (See Comments)    Unknown, but "all the mycins bother me"   Roxicodone [Oxycodone] Other (See Comments)    Confusion    Ultram [Tramadol] Other (See Comments)    Confusion      Outpatient Medications Prior to Visit  Medication Sig Dispense Refill   acetaminophen (TYLENOL) 500 MG tablet Take 1,000 mg by mouth daily as needed for mild pain or headache.     albuterol (VENTOLIN HFA) 108 (90 Base) MCG/ACT inhaler Inhale 2 puffs into the lungs every 6 (six) hours as needed for shortness of  breath or wheezing.     apixaban (ELIQUIS) 2.5 MG TABS tablet Take 1 tablet (2.5 mg total) by mouth 2 (two) times daily. 180 tablet 0   B Complex Vitamins (B COMPLEX PO) Take 1 capsule by mouth daily.     Besifloxacin HCl (BESIVANCE) 0.6 % SUSP Place 1 drop into both eyes See admin instructions. Instill 1 drop into each eye 4 times daily for 2 days after eye injections     Budeson-Glycopyrrol-Formoterol (BREZTRI AEROSPHERE) 160-9-4.8 MCG/ACT AERO Inhale 2 puffs into the lungs in the morning and at bedtime. 10.7 g 5   D-Mannose (URITRAX) POWD Take 1 Scoop by  mouth daily in the afternoon.     D-MANNOSE PO Take 2 tablets by mouth 2 (two) times daily.     denosumab (PROLIA) 60 MG/ML SOSY injection Inject 60 mg into the skin See admin instructions. Every 6 months     diltiazem (TIAZAC) 180 MG 24 hr capsule Take 180 mg by mouth daily.     furosemide (LASIX) 20 MG tablet Take 20 mg by mouth daily as needed for edema.     ketoconazole (NIZORAL) 2 % shampoo Apply 1 Application topically See admin instructions. Apply and lather on skin and let sit for a few minutes prior to rinsing when you shower     memantine (NAMENDA) 10 MG tablet Take 1 tablet by mouth 2 (two) times daily.     Multiple Vitamins-Minerals (PRESERVISION AREDS 2) CAPS Take 1 capsule by mouth in the morning and at bedtime.     Omega-3 Fatty Acids (FISH OIL PO) Take 15 mLs by mouth at bedtime.     OVER THE COUNTER MEDICATION Take 1 each by mouth in the morning. CBD gummy     OVER THE COUNTER MEDICATION Take 1 each by mouth at bedtime. CBD + melatonin gummy     OVER THE COUNTER MEDICATION Take 1 Scoop by mouth daily. Kalmegh powder supplement     Resveratrol 250 MG CAPS Take 1 capsule by mouth 2 (two) times daily.     senna (SENOKOT) 8.6 MG tablet Take 1 tablet by mouth daily as needed for constipation.     Turmeric (QC TUMERIC COMPLEX PO) Take by mouth. Unsure of dosage     benzonatate (TESSALON) 200 MG capsule Take 1 capsule (200 mg total) by mouth 3 (three) times daily as needed. (Patient not taking: Reported on 01/27/2022) 45 capsule 1   No facility-administered medications prior to visit.    Review of Systems  Constitutional:  Negative for chills, diaphoresis, fever, malaise/fatigue and weight loss.  HENT:  Positive for congestion.   Respiratory:  Positive for cough, sputum production, shortness of breath and wheezing. Negative for hemoptysis.   Cardiovascular:  Negative for chest pain, palpitations and leg swelling.     Objective:   Vitals:   01/27/22 1111  BP: 118/64  Pulse:  81  SpO2: 97%  Weight: 131 lb (59.4 kg)  Height: '5\' 7"'$  (1.702 m)     SpO2: 97 % (RA) O2 Device: None (Room air)  Physical Exam: General: Elderly, frail-appearing, no acute distress HENT: [place], AT Eyes: EOMI, no scleral icterus Respiratory: Diminished breath sounds to auscultation bilaterally.  No crackles, wheezing or rales Cardiovascular: RRR, -M/R/G, no JVD Extremities:-Edema,-tenderness Neuro: AAO x2, CNII-XII grossly intact Psych: Normal mood, normal affect  Data Reviewed:  Imaging: CT Chest 08/17/20 - Scattered cylindrical bronchiectasis with interstitium and peribronchovascular ground glass attenuation and micro-nodularity. Complete atelectasis in the RML. LLL pleural nodules  measuring 9 x 8m and 11 x 8 mm nodules CT Chest 12/19/20 - Resolution of pleural nodules in left lower lobe. Interval development of nodular opacities in upper lobes bilaterally. Unchanged atelectasis of RML. CT Chest 02/18/21 - Chronic RML atelectasis. Resolution of upper lobes bilaterally. Of note, improved left atrial thrombus noted. CXR 09/25/21 - Persistent LUL opacity CXR 11/20/21 - Resolved LUL infiltrate, bibasilar atelectasis. Unchanged RML collapse CXR 01/16/22 - Resolved infiltrate. Chronic interstitial changes. Unchanged RML collapse  PFT: None on file  Echo: 09/2020 - EF 50-55%. TV systolic function is reduced. Severely biatrial dilation. Moderate MV, TR  Labs: CBC    Component Value Date/Time   WBC 8.7 09/17/2021 0601   RBC 3.54 (L) 09/17/2021 0601   HGB 14.2 12/03/2021 1304   HCT 42.2 12/03/2021 1304   PLT 149 (L) 09/17/2021 0601   MCV 103.4 (H) 09/17/2021 0601   MCH 33.9 09/17/2021 0601   MCHC 32.8 09/17/2021 0601   RDW 14.0 09/17/2021 0601   LYMPHSABS 1.0 09/16/2021 0705   MONOABS 0.8 09/16/2021 0705   EOSABS 0.0 09/16/2021 0705   BASOSABS 0.0 09/16/2021 0705    Assessment & Plan:   Discussion: 89year old female former smoker with dementia, atrial fibrillation, left  thrombus of atrial appendage, hx breast cancer s/p lumpectomy, osteoporosis who presents for follow-up. Counseled on symptom management as noted below. Smartvest RT and rep present to provide 1:1 teaching session with CPT. Changed patient to small vest to improve effectiveness and adjusted settings to ramp stages. Addressed questions and concerns regarding current regimen  Bronchiectasis with chronic bronchitis   Chronic RML atelectasis --CONTINUE Breztri TWO puffs TWICE a day --CONTINUE Albuterol as needed for shortness of breath or wheezing --Continue smart vest therapy for mucous clearance. Up to twice a day --Encourage mucinex twice a day as needed. Directions per package --Encourage N-acetylcysteine (NAC) 600 mg in the morning and evening  Health Maintenance Immunization History  Administered Date(s) Administered   Fluad Quad(high Dose 65+) 10/04/2021   Influenza, High Dose Seasonal PF 09/06/2017   Influenza, Quadrivalent, Recombinant, Inj, Pf 09/30/2018, 10/31/2019, 09/12/2020   Moderna Sars-Covid-2 Vaccination 01/07/2019, 02/04/2019, 11/18/2019   Pneumococcal Polysaccharide-23 12/17/2021   CT Lung Screen - Not qualified. >15 years after tobacco cessation  No orders of the defined types were placed in this encounter.  No orders of the defined types were placed in this encounter.  Return in about 3 months (around 04/28/2022).   I have spent a total time of 45-minutes on the day of the appointment including chart review, data review, collecting history, coordinating care and discussing medical diagnosis and plan with the patient/family. Past medical history, allergies, medications were reviewed. Pertinent imaging, labs and tests included in this note have been reviewed and interpreted independently by me.  CKnox MD [facility] 01/27/2022 11:18 AM  Office Number [phone]

## 2022-01-29 ENCOUNTER — Encounter: Payer: Self-pay | Admitting: Podiatry

## 2022-01-29 ENCOUNTER — Ambulatory Visit (INDEPENDENT_AMBULATORY_CARE_PROVIDER_SITE_OTHER): Payer: Medicare Other | Admitting: Podiatry

## 2022-01-29 VITALS — BP 135/76

## 2022-01-29 DIAGNOSIS — G629 Polyneuropathy, unspecified: Secondary | ICD-10-CM

## 2022-01-29 DIAGNOSIS — M79674 Pain in right toe(s): Secondary | ICD-10-CM | POA: Diagnosis not present

## 2022-01-29 DIAGNOSIS — M79675 Pain in left toe(s): Secondary | ICD-10-CM

## 2022-01-29 DIAGNOSIS — B351 Tinea unguium: Secondary | ICD-10-CM | POA: Diagnosis not present

## 2022-01-29 NOTE — Progress Notes (Signed)
  Subjective:  Patient ID: Bridget [name], female    DOB: December 08, 1921,  MRN: [id number]  Bridget [name] presents to clinic today for at risk foot care with history of peripheral neuropathy and painful thick toenails that are difficult to trim. Pain interferes with ambulation. Aggravating factors include wearing enclosed shoe gear. Pain is relieved with periodic professional debridement.  Chief Complaint  Patient presents with   Nail Problem    [facility] PCP-Tisovec PCP VST-Fall 2023   New problem(s): None.   PCP is Tisovec, Fransico Him, MD.  Allergies  Allergen Reactions   Pulmicort [Budesonide] Swelling    Eye swelling, ankle and knee swelling   Erythromycin Other (See Comments)    Unknown, but "all the mycins bother me"   Roxicodone [Oxycodone] Other (See Comments)    Confusion    Ultram [Tramadol] Other (See Comments)    Confusion     Review of Systems: Negative except as noted in the HPI.  Objective: No changes noted in today's physical examination. Vitals:   01/29/22 1349  BP: 135/76   Bridget [name] is a pleasant 89 y.o. female WD, WN in NAD. AAO x 3.  Vascular CFT <3 seconds b/l LE. Palpable DP pulse(s) b/l LE. Palpable PT pulse(s) b/l LE. Pedal hair absent. No pain with calf compression b/l. Lower extremity skin temperature gradient within normal limits. No edema noted b/l LE. No cyanosis or clubbing noted b/l LE.   Neurologic Normal speech. Pt has subjective symptoms of neuropathy. Protective sensation intact 5/5 intact bilaterally with 10g monofilament b/l. Vibratory sensation intact b/l.  Dermatologic Pedal skin thin and atrophic b/l LE. No open wounds b/l LE. No interdigital macerations noted b/l LE. Toenails 1-5 b/l elongated, discolored, dystrophic, thickened, crumbly with subungual debris and tenderness to dorsal palpation. No hyperkeratotic nor porokeratotic lesions present on today's visit.  Orthopedic: Muscle strength 5/5 to all lower extremity muscle groups bilaterally.  Hammertoe deformity noted 2-5 b/l.   Assessment/Plan: 1. Pain due to onychomycosis of toenails of both feet   2. Polyneuropathy   -Consent given for treatment as described below: -Examined patient. -Continue supportive shoe gear daily. -Mycotic toenails 1-5 bilaterally were debrided in length and girth with sterile nail nippers and dremel without incident. -Patient/POA to call should there be question/concern in the interim.   Return in about 3 months (around 04/30/2022).  Marzetta Board, DPM

## 2022-01-31 ENCOUNTER — Ambulatory Visit (HOSPITAL_BASED_OUTPATIENT_CLINIC_OR_DEPARTMENT_OTHER): Payer: Medicare Other | Admitting: Pulmonary Disease

## 2022-01-31 ENCOUNTER — Telehealth: Payer: Self-pay | Admitting: Pulmonary Disease

## 2022-01-31 ENCOUNTER — Encounter (INDEPENDENT_AMBULATORY_CARE_PROVIDER_SITE_OTHER): Payer: Medicare Other | Admitting: Ophthalmology

## 2022-01-31 DIAGNOSIS — H35033 Hypertensive retinopathy, bilateral: Secondary | ICD-10-CM | POA: Diagnosis not present

## 2022-01-31 DIAGNOSIS — H43813 Vitreous degeneration, bilateral: Secondary | ICD-10-CM

## 2022-01-31 DIAGNOSIS — I10 Essential (primary) hypertension: Secondary | ICD-10-CM

## 2022-01-31 DIAGNOSIS — H353231 Exudative age-related macular degeneration, bilateral, with active choroidal neovascularization: Secondary | ICD-10-CM

## 2022-01-31 NOTE — Telephone Encounter (Signed)
PT daughter calling for Mom. Dr. suggested they use Nac (The serving size is 2 capsules 1.2 Grams per serving) She uses Eliquis (2.5 mg 2 x's per day) and Diltiazin (180 mg once per day)  She was wanted to make sure there is no interactions between these two meds and the NAC.  [phone] is Daughters #

## 2022-02-02 ENCOUNTER — Encounter (HOSPITAL_BASED_OUTPATIENT_CLINIC_OR_DEPARTMENT_OTHER): Payer: Self-pay | Admitting: Pulmonary Disease

## 2022-02-03 NOTE — Telephone Encounter (Signed)
Please advise on medication dosage

## 2022-02-04 NOTE — Telephone Encounter (Signed)
No interactions with these medications. This was cross checked with our medical database as well (Microdex). Please let patient/daughter know ok to take.

## 2022-02-05 NOTE — Telephone Encounter (Signed)
Talk to patient's daughter, let her know provider recommendations. Nothing further needed

## 2022-02-12 ENCOUNTER — Other Ambulatory Visit: Payer: Self-pay | Admitting: Cardiology

## 2022-02-12 DIAGNOSIS — Z7901 Long term (current) use of anticoagulants: Secondary | ICD-10-CM

## 2022-02-12 DIAGNOSIS — I4821 Permanent atrial fibrillation: Secondary | ICD-10-CM

## 2022-02-12 DIAGNOSIS — I513 Intracardiac thrombosis, not elsewhere classified: Secondary | ICD-10-CM

## 2022-02-21 ENCOUNTER — Telehealth: Payer: Self-pay | Admitting: Cardiology

## 2022-02-21 DIAGNOSIS — I513 Intracardiac thrombosis, not elsewhere classified: Secondary | ICD-10-CM

## 2022-02-21 NOTE — Telephone Encounter (Signed)
Recent arthrocentesis with knee pain improvement. 35 cc bloody fluid was removed. Possibly due to eliquis. Continue eliquis since fluid is already removed.  She is starting on a pain NSAID medication, which could increase blood pressure. Monitor BP. Contact if SBP >160 mmHg consistently.   Time spent: 10 min    Nigel Mormon, MD Pager: [phone] Office: [phone]

## 2022-03-15 ENCOUNTER — Emergency Department (HOSPITAL_COMMUNITY): Payer: Medicare Other

## 2022-03-15 ENCOUNTER — Inpatient Hospital Stay (HOSPITAL_COMMUNITY)
Admission: EM | Admit: 2022-03-15 | Discharge: 2022-03-20 | DRG: 065 | Disposition: A | Discharge status: Rehabilitation Facility | Payer: Medicare Other | Attending: Internal Medicine | Admitting: Internal Medicine

## 2022-03-15 ENCOUNTER — Other Ambulatory Visit: Payer: Self-pay

## 2022-03-15 DIAGNOSIS — Z853 Personal history of malignant neoplasm of breast: Secondary | ICD-10-CM

## 2022-03-15 DIAGNOSIS — Z888 Allergy status to other drugs, medicaments and biological substances status: Secondary | ICD-10-CM

## 2022-03-15 DIAGNOSIS — Z79899 Other long term (current) drug therapy: Secondary | ICD-10-CM

## 2022-03-15 DIAGNOSIS — R29702 NIHSS score 2: Secondary | ICD-10-CM | POA: Diagnosis present

## 2022-03-15 DIAGNOSIS — E871 Hypo-osmolality and hyponatremia: Secondary | ICD-10-CM | POA: Diagnosis present

## 2022-03-15 DIAGNOSIS — R471 Dysarthria and anarthria: Secondary | ICD-10-CM | POA: Diagnosis present

## 2022-03-15 DIAGNOSIS — I4891 Unspecified atrial fibrillation: Secondary | ICD-10-CM

## 2022-03-15 DIAGNOSIS — Z7951 Long term (current) use of inhaled steroids: Secondary | ICD-10-CM

## 2022-03-15 DIAGNOSIS — M810 Age-related osteoporosis without current pathological fracture: Secondary | ICD-10-CM | POA: Diagnosis present

## 2022-03-15 DIAGNOSIS — R29810 Facial weakness: Secondary | ICD-10-CM | POA: Diagnosis present

## 2022-03-15 DIAGNOSIS — Z885 Allergy status to narcotic agent status: Secondary | ICD-10-CM

## 2022-03-15 DIAGNOSIS — I700 Atherosclerosis of aorta: Secondary | ICD-10-CM | POA: Diagnosis present

## 2022-03-15 DIAGNOSIS — R911 Solitary pulmonary nodule: Secondary | ICD-10-CM | POA: Diagnosis present

## 2022-03-15 DIAGNOSIS — I639 Cerebral infarction, unspecified: Secondary | ICD-10-CM | POA: Diagnosis not present

## 2022-03-15 DIAGNOSIS — I4821 Permanent atrial fibrillation: Secondary | ICD-10-CM | POA: Diagnosis present

## 2022-03-15 DIAGNOSIS — Q208 Other congenital malformations of cardiac chambers and connections: Secondary | ICD-10-CM

## 2022-03-15 DIAGNOSIS — N3000 Acute cystitis without hematuria: Secondary | ICD-10-CM

## 2022-03-15 DIAGNOSIS — I119 Hypertensive heart disease without heart failure: Secondary | ICD-10-CM | POA: Diagnosis present

## 2022-03-15 DIAGNOSIS — R32 Unspecified urinary incontinence: Secondary | ICD-10-CM | POA: Diagnosis present

## 2022-03-15 DIAGNOSIS — I513 Intracardiac thrombosis, not elsewhere classified: Secondary | ICD-10-CM

## 2022-03-15 DIAGNOSIS — G8191 Hemiplegia, unspecified affecting right dominant side: Secondary | ICD-10-CM | POA: Diagnosis present

## 2022-03-15 DIAGNOSIS — I2510 Atherosclerotic heart disease of native coronary artery without angina pectoris: Secondary | ICD-10-CM | POA: Diagnosis present

## 2022-03-15 DIAGNOSIS — Z8673 Personal history of transient ischemic attack (TIA), and cerebral infarction without residual deficits: Secondary | ICD-10-CM

## 2022-03-15 DIAGNOSIS — N83201 Unspecified ovarian cyst, right side: Secondary | ICD-10-CM | POA: Diagnosis present

## 2022-03-15 DIAGNOSIS — F0390 Unspecified dementia without behavioral disturbance: Secondary | ICD-10-CM | POA: Diagnosis present

## 2022-03-15 DIAGNOSIS — Z7901 Long term (current) use of anticoagulants: Secondary | ICD-10-CM

## 2022-03-15 DIAGNOSIS — E119 Type 2 diabetes mellitus without complications: Secondary | ICD-10-CM | POA: Diagnosis present

## 2022-03-15 DIAGNOSIS — B964 Proteus (mirabilis) (morganii) as the cause of diseases classified elsewhere: Secondary | ICD-10-CM | POA: Diagnosis present

## 2022-03-15 DIAGNOSIS — K5909 Other constipation: Secondary | ICD-10-CM | POA: Diagnosis present

## 2022-03-15 DIAGNOSIS — Z66 Do not resuscitate: Secondary | ICD-10-CM | POA: Diagnosis present

## 2022-03-15 DIAGNOSIS — Z90710 Acquired absence of both cervix and uterus: Secondary | ICD-10-CM

## 2022-03-15 DIAGNOSIS — Z87891 Personal history of nicotine dependence: Secondary | ICD-10-CM

## 2022-03-15 DIAGNOSIS — I6340 Cerebral infarction due to embolism of unspecified cerebral artery: Secondary | ICD-10-CM | POA: Diagnosis not present

## 2022-03-15 DIAGNOSIS — E861 Hypovolemia: Secondary | ICD-10-CM | POA: Diagnosis present

## 2022-03-15 DIAGNOSIS — Z881 Allergy status to other antibiotic agents status: Secondary | ICD-10-CM

## 2022-03-15 DIAGNOSIS — I2584 Coronary atherosclerosis due to calcified coronary lesion: Secondary | ICD-10-CM | POA: Diagnosis present

## 2022-03-15 DIAGNOSIS — E7800 Pure hypercholesterolemia, unspecified: Secondary | ICD-10-CM | POA: Diagnosis present

## 2022-03-15 DIAGNOSIS — J9811 Atelectasis: Secondary | ICD-10-CM | POA: Diagnosis present

## 2022-03-15 DIAGNOSIS — F03B0 Unspecified dementia, moderate, without behavioral disturbance, psychotic disturbance, mood disturbance, and anxiety: Secondary | ICD-10-CM | POA: Diagnosis present

## 2022-03-15 DIAGNOSIS — J42 Unspecified chronic bronchitis: Secondary | ICD-10-CM | POA: Diagnosis present

## 2022-03-15 DIAGNOSIS — Z7983 Long term (current) use of bisphosphonates: Secondary | ICD-10-CM

## 2022-03-15 DIAGNOSIS — J479 Bronchiectasis, uncomplicated: Secondary | ICD-10-CM | POA: Diagnosis present

## 2022-03-15 DIAGNOSIS — N390 Urinary tract infection, site not specified: Secondary | ICD-10-CM | POA: Diagnosis present

## 2022-03-15 LAB — URINALYSIS, ROUTINE W REFLEX MICROSCOPIC
Bilirubin Urine: NEGATIVE
Glucose, UA: NEGATIVE mg/dL
Hgb urine dipstick: NEGATIVE
Ketones, ur: NEGATIVE mg/dL
Nitrite: POSITIVE — AB
Protein, ur: NEGATIVE mg/dL
Specific Gravity, Urine: 1.021 (ref 1.005–1.030)
WBC, UA: >50 WBC/hpf (ref 0–5)
pH: 6 (ref 5.0–8.0)

## 2022-03-15 LAB — ETHANOL: Alcohol, Ethyl (B): <10 mg/dL (ref <10)

## 2022-03-15 LAB — DIFFERENTIAL
Abs Immature Granulocytes: 0.02 10*3/uL (ref 0.00–0.07)
Basophils Absolute: 0 10*3/uL (ref 0.0–0.1)
Basophils Relative: 1 %
Eosinophils Absolute: 0.1 10*3/uL (ref 0.0–0.5)
Eosinophils Relative: 2 %
Immature Granulocytes: 0 %
Lymphocytes Relative: 35 %
Lymphs Abs: 1.9 10*3/uL (ref 0.7–4.0)
Monocytes Absolute: 0.6 10*3/uL (ref 0.1–1.0)
Monocytes Relative: 10 %
Neutro Abs: 2.9 10*3/uL (ref 1.7–7.7)
Neutrophils Relative %: 52 %

## 2022-03-15 LAB — COMPREHENSIVE METABOLIC PANEL
ALT: 13 U/L (ref 0–44)
AST: 21 U/L (ref 15–41)
Albumin: 3.4 g/dL — ABNORMAL LOW (ref 3.5–5.0)
Alkaline Phosphatase: 55 U/L (ref 38–126)
Anion gap: 12 (ref 5–15)
BUN: 17 mg/dL (ref 8–23)
CO2: 23 mmol/L (ref 22–32)
Calcium: 9.3 mg/dL (ref 8.9–10.3)
Chloride: 95 mmol/L — ABNORMAL LOW (ref 98–111)
Creatinine, Ser: 0.81 mg/dL (ref 0.44–1.00)
GFR, Estimated: >60 mL/min (ref >60)
Glucose, Bld: 92 mg/dL (ref 70–99)
Potassium: 4.9 mmol/L (ref 3.5–5.1)
Sodium: 130 mmol/L — ABNORMAL LOW (ref 135–145)
Total Bilirubin: 0.8 mg/dL (ref 0.3–1.2)
Total Protein: 6.1 g/dL — ABNORMAL LOW (ref 6.5–8.1)

## 2022-03-15 LAB — I-STAT CHEM 8, ED
BUN: 22 mg/dL (ref 8–23)
Calcium, Ion: 1.05 mmol/L — ABNORMAL LOW (ref 1.15–1.40)
Chloride: 97 mmol/L — ABNORMAL LOW (ref 98–111)
Creatinine, Ser: 0.9 mg/dL (ref 0.44–1.00)
Glucose, Bld: 92 mg/dL (ref 70–99)
HCT: 38 % (ref 36.0–46.0)
Hemoglobin: 12.9 g/dL (ref 12.0–15.0)
Potassium: 4.9 mmol/L (ref 3.5–5.1)
Sodium: 130 mmol/L — ABNORMAL LOW (ref 135–145)
TCO2: 28 mmol/L (ref 22–32)

## 2022-03-15 LAB — PROTIME-INR
INR: 1.1 (ref 0.8–1.2)
Prothrombin Time: 14.3 seconds (ref 11.4–15.2)

## 2022-03-15 LAB — APTT: aPTT: 27 seconds (ref 24–36)

## 2022-03-15 LAB — CBG MONITORING, ED: Glucose-Capillary: 109 mg/dL — ABNORMAL HIGH (ref 70–99)

## 2022-03-15 LAB — CBC
HCT: 37.8 % (ref 36.0–46.0)
Hemoglobin: 12.6 g/dL (ref 12.0–15.0)
MCH: 33.3 pg (ref 26.0–34.0)
MCHC: 33.3 g/dL (ref 30.0–36.0)
MCV: 100 fL (ref 80.0–100.0)
Platelets: 210 10*3/uL (ref 150–400)
RBC: 3.78 MIL/uL — ABNORMAL LOW (ref 3.87–5.11)
RDW: 14.1 % (ref 11.5–15.5)
WBC: 5.5 10*3/uL (ref 4.0–10.5)
nRBC: 0 % (ref 0.0–0.2)

## 2022-03-15 MED ORDER — SODIUM CHLORIDE 0.9 % IV SOLN
1.0000 g | Freq: Once | INTRAVENOUS | Status: AC
  Administered 2022-03-15: 1 g via INTRAVENOUS
  Filled 2022-03-15: qty 10

## 2022-03-15 MED ORDER — SODIUM CHLORIDE 0.9% FLUSH: 3.0000 mL | Freq: Once | INTRAVENOUS | Status: DC

## 2022-03-15 MED ORDER — IOHEXOL 350 MG/ML SOLN
75.0000 mL | Freq: Once | INTRAVENOUS | Status: AC | PRN
  Administered 2022-03-15: 75 mL via INTRAVENOUS

## 2022-03-15 MED ORDER — SODIUM CHLORIDE 0.9 % IV BOLUS
500.0000 mL | Freq: Once | INTRAVENOUS | Status: AC
  Administered 2022-03-15: 500 mL via INTRAVENOUS

## 2022-03-15 NOTE — ED Triage Notes (Signed)
Patient arrived with EMS from home as a code stroke , LSN 2130 this evening , family reported slurred speech and right facial droop . Evaluated by EDP and neurologist at arrival and transported to CT scan .

## 2022-03-15 NOTE — ED Provider Notes (Signed)
[facility] Emergency Department Provider Note MRN:  [id number]  Arrival date & time: 03/16/22     Chief Complaint   Code Stroke   History of Present Illness   Bridget [name] is a 89 y.o. year-old female presents to the ED with chief complaint of drooling and dysarthria.  Onset was about 4-5 hours prior to arrival.  She is anticoagulated on Eliquis.  She is accompanied by daughter and granddaughter, who also report that she has had some left abdominal distension.  She has been having normal BMs.  Otherwise, denies any new complaints.  History provided by patient.   Review of Systems  Pertinent positive and negative review of systems noted in HPI.    Physical Exam   Vitals:   03/15/22 2330 03/15/22 2345  BP: (!) 169/70 (!) 157/64  Pulse: 82 76  Resp: 15 16  Temp:    SpO2: 95% 96%    CONSTITUTIONAL:  well-appearing, NAD NEURO:  Alert and oriented x 3, CN 3-12 grossly intact EYES:  eyes equal and reactive ENT/NECK:  Supple, no stridor  CARDIO:  normal rate, regular rhythm, appears well-perfused  PULM:  No respiratory distress, CTAB GI/GU:  non-distended,  MSK/SPINE:  No gross deformities, no edema, moves all extremities  SKIN:  no rash, atraumatic   *Additional and/or pertinent findings included in MDM below  Diagnostic and Interventional Summary    EKG Interpretation  Date/Time:  Saturday March 15 2022 22:49:37 EST Ventricular Rate:  74 PR Interval:    QRS Duration: 146 QT Interval:  411 QTC Calculation: 456 R Axis:   34 Text Interpretation: Atrial fibrillation Right bundle branch block Consider left ventricular hypertrophy Anterior Q waves, possibly due to LVH Confirmed by Elnora Morrison [phone]) on 03/15/2022 10:51:59 PM       Labs Reviewed  CBC - Abnormal; Notable for the following components:      Result Value   RBC 3.78 (*)    All other components within normal limits  COMPREHENSIVE METABOLIC PANEL - Abnormal; Notable for the  following components:   Sodium 130 (*)    Chloride 95 (*)    Total Protein 6.1 (*)    Albumin 3.4 (*)    All other components within normal limits  URINALYSIS, ROUTINE W REFLEX MICROSCOPIC - Abnormal; Notable for the following components:   APPearance CLOUDY (*)    Nitrite POSITIVE (*)    Leukocytes,Ua LARGE (*)    Bacteria, UA RARE (*)    All other components within normal limits  I-STAT CHEM 8, ED - Abnormal; Notable for the following components:   Sodium 130 (*)    Chloride 97 (*)    Calcium, Ion 1.05 (*)    All other components within normal limits  CBG MONITORING, ED - Abnormal; Notable for the following components:   Glucose-Capillary 109 (*)    All other components within normal limits  PROTIME-INR  APTT  DIFFERENTIAL  ETHANOL    MR BRAIN WO CONTRAST  Final Result    DG Chest Port 1 View  Final Result    CT ANGIO HEAD NECK W WO CM (CODE STROKE)  Final Result    CT HEAD CODE STROKE WO CONTRAST  Final Result    CT ABDOMEN PELVIS W CONTRAST    (Results Pending)    Medications  sodium chloride flush (NS) 0.9 % injection 3 mL (3 mLs Intravenous Not Given 03/15/22 2309)  iohexol (OMNIPAQUE) 350 MG/ML injection 75 mL (75 mLs Intravenous Contrast  Given 03/15/22 2240)  sodium chloride 0.9 % bolus 500 mL (0 mLs Intravenous Stopped 03/15/22 2347)  cefTRIAXone (ROCEPHIN) 1 g in sodium chloride 0.9 % 100 mL IVPB (0 g Intravenous Stopped 03/16/22 0018)     Procedures  /  Critical Care .Critical Care  Performed by: Montine Circle, PA-C Authorized by: Montine Circle, PA-C   Critical care provider statement:    Critical care time (minutes):  35   Critical care was necessary to treat or prevent imminent or life-threatening deterioration of the following conditions:  CNS failure or compromise   Critical care was time spent personally by me on the following activities:  Development of treatment plan with patient or surrogate, discussions with consultants, evaluation of patient's  response to treatment, examination of patient, ordering and review of laboratory studies, ordering and review of radiographic studies, ordering and performing treatments and interventions, pulse oximetry, re-evaluation of patient's condition and review of old charts   ED Course and Medical Decision Making  I have reviewed the triage vital signs, the nursing notes, and pertinent available records from the EMR.  Social Determinants Affecting Complexity of Care: Patient has no clinically significant social determinants affecting this chief complaint..   ED Course:    Medical Decision Making Patient here with slurred speech and drooling that is new. Onset about 5 hours PTA. Came in as a code stroke.  Seen by neurology.  TNK not given.  Patient on Eliquis.  Patient has dementia. Daughter states that she is DNR.  Will check labs and imaging.  Amount and/or Complexity of Data Reviewed Labs: ordered.    Details: Na 130, giving 573m NS UA concerning for infection Radiology: ordered and independent interpretation performed.    Details: No obvious ICH or stroke seen on CT. ECG/medicine tests: ordered and independent interpretation performed.    Details: Rate controlled a-fib  Risk Prescription drug management. Decision regarding hospitalization.     Consultants: I discussed the case with Hospitalist, Dr. HNevada Crane who is appreciated for admitting. I consulted with Dr. ARory Percy who says MRI shows stroke and patient will need admission.  Treatment and Plan: Patient's exam and diagnostic results are concerning for stroke.  Feel that patient will need admission to the hospital for further treatment and evaluation.    Final Clinical Impressions(s) / ED Diagnoses     ICD-10-CM   1. Cerebrovascular accident (CVA), unspecified mechanism ([place]  I63.9     2. Acute cystitis without hematuria  N30.00       ED Discharge Orders     None         Discharge Instructions Discussed with and  Provided to Patient:   Discharge Instructions   None      BMontine Circle PA-C 03/16/22 [place] DO 03/16/22 0147

## 2022-03-15 NOTE — Consult Note (Signed)
Neurology Consultation  Reason for Consult: Code stroke-facial droop on the right, slurred speech of sudden onset Referring Physician: Dr. Reather Converse  CC: Slurred speech, drooling from the mouth  History is obtained from: Patient, daughter, chart  HPI: Bridget [name] is a 89 y.o. female past medical history of atrial fibrillation, left atrial appendage thrombus, on Eliquis, history of breast cancer, prior history of stroke with no residual deficits, dementia, general debility with using walker to walk, sequel of dementia requiring 24/7 help with ADLs, presented to the emergency room via EMS for concern for strokelike symptoms Last known well was 2130, was being helped to go to bed when she had a sudden onset of garbled speech and the liquid in her mouth started to drool from 1 side.  Daughter is unclear which side the droop was.  EMTs reported right-sided droop initially.  Patient was having a hard time formulating her sentences and her speech was extremely garbled.  Her speech started to become somewhat more comprehensible on the way.  When I examined her, she was severely dysarthric with no evidence of aphasia at this time.  Currently on Eliquis. Was on pradaxa at one point but was changed to Eliquis.  LKW: 2130 IV thrombolysis given?: no, on Eliquis EVT: No LVO, poor MR S Premorbid modified Rankin scale (mRS): 4   ROS: Full ROS was performed and is negative except as noted in the HPI.   Past Medical History:  Diagnosis Date   Atherosclerosis of aorta (HCC)    Atrial fibrillation (HCC)    Breast cancer ([place])    Chronic Bronchitis    Coronary atherosclerosis due to calcified coronary lesion    Nocturnal polyuria    Stroke (HCC)    Thrombus of left atrial appendage      Family History  Problem Relation Age of Onset   Other Sister        TRAUMA TO HEAD AFTER A FALL     Social History:   reports that she quit smoking about 74 years ago. Her smoking use included cigarettes. She has  a 10.50 pack-year smoking history. She has never used smokeless tobacco. She reports that she does not drink alcohol and does not use drugs.  Medications  Current Facility-Administered Medications:    sodium chloride 0.9 % bolus 500 mL, 500 mL, Intravenous, Once, Montine Circle, PA-C   sodium chloride flush (NS) 0.9 % injection 3 mL, 3 mL, Intravenous, Once, Elnora Morrison, MD  Current Outpatient Medications:    acetaminophen (TYLENOL) 500 MG tablet, Take 1,000 mg by mouth daily as needed for mild pain or headache., Disp: , Rfl:    albuterol (VENTOLIN HFA) 108 (90 Base) MCG/ACT inhaler, Inhale 2 puffs into the lungs every 6 (six) hours as needed for shortness of breath or wheezing., Disp: , Rfl:    apixaban (ELIQUIS) 2.5 MG TABS tablet, TAKE 1 TABLET BY MOUTH TWICE A DAY, Disp: 180 tablet, Rfl: 1   B Complex Vitamins (B COMPLEX PO), Take 1 capsule by mouth daily., Disp: , Rfl:    benzonatate (TESSALON) 200 MG capsule, Take 1 capsule (200 mg total) by mouth 3 (three) times daily as needed. (Patient not taking: Reported on 01/27/2022), Disp: 45 capsule, Rfl: 1   Besifloxacin HCl (BESIVANCE) 0.6 % SUSP, Place 1 drop into both eyes See admin instructions. Instill 1 drop into each eye 4 times daily for 2 days after eye injections, Disp: , Rfl:    Budeson-Glycopyrrol-Formoterol (BREZTRI AEROSPHERE) 160-9-4.8 MCG/ACT AERO, Inhale  2 puffs into the lungs in the morning and at bedtime., Disp: 10.7 g, Rfl: 5   D-Mannose (URITRAX) POWD, Take 1 Scoop by mouth daily in the afternoon., Disp: , Rfl:    D-MANNOSE PO, Take 2 tablets by mouth 2 (two) times daily., Disp: , Rfl:    denosumab (PROLIA) 60 MG/ML SOSY injection, Inject 60 mg into the skin See admin instructions. Every 6 months, Disp: , Rfl:    diltiazem (TIAZAC) 180 MG 24 hr capsule, Take 180 mg by mouth daily., Disp: , Rfl:    furosemide (LASIX) 20 MG tablet, Take 20 mg by mouth daily as needed for edema., Disp: , Rfl:    ketoconazole (NIZORAL) 2 %  shampoo, Apply 1 Application topically See admin instructions. Apply and lather on skin and let sit for a few minutes prior to rinsing when you shower, Disp: , Rfl:    memantine (NAMENDA) 10 MG tablet, Take 1 tablet by mouth 2 (two) times daily., Disp: , Rfl:    Multiple Vitamins-Minerals (PRESERVISION AREDS 2) CAPS, Take 1 capsule by mouth in the morning and at bedtime., Disp: , Rfl:    Omega-3 Fatty Acids (FISH OIL PO), Take 15 mLs by mouth at bedtime., Disp: , Rfl:    OVER THE COUNTER MEDICATION, Take 1 each by mouth in the morning. CBD gummy, Disp: , Rfl:    OVER THE COUNTER MEDICATION, Take 1 each by mouth at bedtime. CBD + melatonin gummy, Disp: , Rfl:    OVER THE COUNTER MEDICATION, Take 1 Scoop by mouth daily. Kalmegh powder supplement, Disp: , Rfl:    Resveratrol 250 MG CAPS, Take 1 capsule by mouth 2 (two) times daily., Disp: , Rfl:    senna (SENOKOT) 8.6 MG tablet, Take 1 tablet by mouth daily as needed for constipation., Disp: , Rfl:    Turmeric (QC TUMERIC COMPLEX PO), Take by mouth. Unsure of dosage, Disp: , Rfl:    Exam: Current vital signs: BP (!) 172/67   Pulse 90   Temp 97.7 F (36.5 C) (Oral)   Resp (!) 21   Ht '5\' 7"'$  (1.702 m)   Wt 62.8 kg   SpO2 96%   BMI 21.68 kg/m  Vital signs in last 24 hours: Temp:  [97.7 F (36.5 C)] 97.7 F (36.5 C) (03/09 2252) Pulse Rate:  [90] 90 (03/09 2245) Resp:  [21] 21 (03/09 2245) BP: (172)/(67) 172/67 (03/09 2245) SpO2:  [96 %] 96 % (03/09 2245) Weight:  [62.8 kg] 62.8 kg (03/09 2200) General: Awake alert in no distress HEENT: Normocephalic atraumatic Lungs: Clear Cardiovascular: Regular rate rhythm Abdomen nondistended nontender Extremities warm well-perfused Neurological exam Awake alert oriented to self Unable to tell me the current month or correct age Able to follow simple commands Able to name simple objects Able to repeat sentences Mild to moderate dysarthria Cranial nerves II to XII intact Motor examination  with no drift in the upper extremities.  Unable to go antigravity in bilateral upper extremities for more than a second or so. Sensation intact to light touch Coordination difficult to assess with no dysmetria in the upper extremities.  Unable to perform the lower extremities. NIHSS 1a Level of Conscious.: 0 1b LOC Questions: 2 1c LOC Commands: 0 2 Best Gaze: 0 3 Visual: 0 4 Facial Palsy: 0 5a Motor Arm - left: 0 5b Motor Arm - Right: 0 6a Motor Leg - Left: 3 6b Motor Leg - Right: 3 7 Limb Ataxia: 0 8 Sensory: 0 9 Best  Language: 0 10 Dysarthria: 3 11 Extinct. and Inatten.: 0 TOTAL: 11   Labs I have reviewed labs in epic and the results pertinent to this consultation are:  CBC    Component Value Date/Time   WBC 5.5 03/15/2022 2220   RBC 3.78 (L) 03/15/2022 2220   HGB 12.9 03/15/2022 2228   HGB 14.2 12/03/2021 1304   HCT 38.0 03/15/2022 2228   HCT 42.2 12/03/2021 1304   PLT 210 03/15/2022 2220   MCV 100.0 03/15/2022 2220   MCH 33.3 03/15/2022 2220   MCHC 33.3 03/15/2022 2220   RDW 14.1 03/15/2022 2220   LYMPHSABS 1.9 03/15/2022 2220   MONOABS 0.6 03/15/2022 2220   EOSABS 0.1 03/15/2022 2220   BASOSABS 0.0 03/15/2022 2220    CMP     Component Value Date/Time   NA 130 (L) 03/15/2022 2228   NA 137 12/03/2021 1304   K 4.9 03/15/2022 2228   CL 97 (L) 03/15/2022 2228   CO2 27 12/03/2021 1304   GLUCOSE 92 03/15/2022 2228   BUN 22 03/15/2022 2228   BUN 15 12/03/2021 1304   CREATININE 0.90 03/15/2022 2228   CALCIUM 9.1 12/03/2021 1304   PROT 5.5 (L) 09/16/2021 0705   PROT 6.0 08/28/2020 1402   ALBUMIN 2.8 (L) 09/16/2021 0705   ALBUMIN 3.8 08/28/2020 1402   AST 19 09/16/2021 0705   ALT 22 09/16/2021 0705   ALKPHOS 44 09/16/2021 0705   BILITOT 1.0 09/16/2021 0705   BILITOT 0.6 08/28/2020 1402   GFRNONAA >60 09/17/2021 0601    Imaging I have reviewed the images obtained:  CT-head: No acute intracranial process.  CT angio head and neck: No emergent  LVO  Assessment:  89 year old with history of dementia requiring assistance 24/7 with ADLs, atrial fibrillation and LA thrombus on Eliquis, breast cancer, presented to emergency room for sudden onset of slurred speech, word finding difficulty and facial droop possibly on the right side.  Facial droop symptoms in the word finding difficulty/garbled speech symptoms were improving but she remained mild to moderately dysarthric at the time of this exam. Likely has suffered a small stroke-will need further workup  Impression: Evaluate for stroke Evaluate for toxic metabolic encephalopathy causing dysarthria  Recommendations: MRI brain without contrast If positive for stroke, will need admission for frequent neurochecks, telemetry, 2D echo, A1c, lipid panel, PT OT and speech therapy. If MRI negative, will need evaluation for toxic metabolic encephalopathy including UA, chest x-ray, B12 levels, TSH. There is no reported seizure history or no concern for seizure-no need for EEG. I will follow-up imaging studies with you Plan was discussed with Lorre Munroe, PA-C in the ER as well as patient's daughter and granddaughter.   -- Amie Portland, MD Neurologist [facility] Pager: [phone]   ADDENDUM MR brain completed. Shows strokes in the left precentral gyrus and posterior frontal lobe.  Admit for stroke w/u.  Updated recs below: -Admit to hospitalist or observation -Telemetry monitoring -Allow for permissive hypertension for the first 24-48h - only treat PRN if SBP >220 mmHg. Blood pressures can be gradually normalized to SBP<140 upon discharge. -Echocardiogram -HgbA1c, fasting lipid panel -Frequent neuro checks -Prophylactic therapy-On eliquis at home. Hold for now. Aspirin 81 for now. Will recommend AM consult with Dr. Terri Skains or covering cardiologist from his practice [facility] cardiology) RE: choice of anticoagulant. Reportedly was on pradaxa, which was switched to Eliquis.  Now with strokes on eliquis, may consider another DOAC or Coumadin - defer to cardiology consultation.  -High intensity statin  for goal LDL <70 -Risk factor modification -PT consult, OT consult, Speech consult  Plan relayed to EDP Dr Frederic Jericho PA-c  Please page stroke NP/PA/MD (listed on AMION)  from 8am-4 pm as this patient will be followed by the stroke team at this point.  -- Amie Portland, MD Neurologist [facility] Pager: [phone]

## 2022-03-16 ENCOUNTER — Inpatient Hospital Stay (HOSPITAL_COMMUNITY): Payer: Medicare Other

## 2022-03-16 ENCOUNTER — Emergency Department (HOSPITAL_COMMUNITY): Payer: Medicare Other

## 2022-03-16 DIAGNOSIS — I6389 Other cerebral infarction: Secondary | ICD-10-CM

## 2022-03-16 DIAGNOSIS — I639 Cerebral infarction, unspecified: Secondary | ICD-10-CM | POA: Diagnosis present

## 2022-03-16 DIAGNOSIS — I69392 Facial weakness following cerebral infarction: Secondary | ICD-10-CM | POA: Diagnosis not present

## 2022-03-16 DIAGNOSIS — I2510 Atherosclerotic heart disease of native coronary artery without angina pectoris: Secondary | ICD-10-CM | POA: Diagnosis present

## 2022-03-16 DIAGNOSIS — I2584 Coronary atherosclerosis due to calcified coronary lesion: Secondary | ICD-10-CM | POA: Diagnosis present

## 2022-03-16 DIAGNOSIS — I4891 Unspecified atrial fibrillation: Secondary | ICD-10-CM

## 2022-03-16 DIAGNOSIS — Z7951 Long term (current) use of inhaled steroids: Secondary | ICD-10-CM | POA: Diagnosis not present

## 2022-03-16 DIAGNOSIS — I6300 Cerebral infarction due to thrombosis of unspecified precerebral artery: Secondary | ICD-10-CM | POA: Diagnosis not present

## 2022-03-16 DIAGNOSIS — I513 Intracardiac thrombosis, not elsewhere classified: Secondary | ICD-10-CM | POA: Diagnosis present

## 2022-03-16 DIAGNOSIS — N390 Urinary tract infection, site not specified: Secondary | ICD-10-CM | POA: Diagnosis present

## 2022-03-16 DIAGNOSIS — K5900 Constipation, unspecified: Secondary | ICD-10-CM | POA: Diagnosis present

## 2022-03-16 DIAGNOSIS — Z87891 Personal history of nicotine dependence: Secondary | ICD-10-CM | POA: Diagnosis not present

## 2022-03-16 DIAGNOSIS — F03B0 Unspecified dementia, moderate, without behavioral disturbance, psychotic disturbance, mood disturbance, and anxiety: Secondary | ICD-10-CM | POA: Diagnosis present

## 2022-03-16 DIAGNOSIS — I700 Atherosclerosis of aorta: Secondary | ICD-10-CM

## 2022-03-16 DIAGNOSIS — J9811 Atelectasis: Secondary | ICD-10-CM | POA: Diagnosis present

## 2022-03-16 DIAGNOSIS — R32 Unspecified urinary incontinence: Secondary | ICD-10-CM | POA: Diagnosis not present

## 2022-03-16 DIAGNOSIS — R29810 Facial weakness: Secondary | ICD-10-CM | POA: Diagnosis present

## 2022-03-16 DIAGNOSIS — Z885 Allergy status to narcotic agent status: Secondary | ICD-10-CM | POA: Diagnosis not present

## 2022-03-16 DIAGNOSIS — Z853 Personal history of malignant neoplasm of breast: Secondary | ICD-10-CM | POA: Diagnosis not present

## 2022-03-16 DIAGNOSIS — E7800 Pure hypercholesterolemia, unspecified: Secondary | ICD-10-CM

## 2022-03-16 DIAGNOSIS — E785 Hyperlipidemia, unspecified: Secondary | ICD-10-CM | POA: Diagnosis present

## 2022-03-16 DIAGNOSIS — K5909 Other constipation: Secondary | ICD-10-CM | POA: Diagnosis present

## 2022-03-16 DIAGNOSIS — K5901 Slow transit constipation: Secondary | ICD-10-CM | POA: Diagnosis not present

## 2022-03-16 DIAGNOSIS — B964 Proteus (mirabilis) (morganii) as the cause of diseases classified elsewhere: Secondary | ICD-10-CM | POA: Diagnosis present

## 2022-03-16 DIAGNOSIS — Z881 Allergy status to other antibiotic agents status: Secondary | ICD-10-CM | POA: Diagnosis not present

## 2022-03-16 DIAGNOSIS — R471 Dysarthria and anarthria: Secondary | ICD-10-CM | POA: Diagnosis present

## 2022-03-16 DIAGNOSIS — Z79899 Other long term (current) drug therapy: Secondary | ICD-10-CM | POA: Diagnosis not present

## 2022-03-16 DIAGNOSIS — J479 Bronchiectasis, uncomplicated: Secondary | ICD-10-CM | POA: Diagnosis present

## 2022-03-16 DIAGNOSIS — G8191 Hemiplegia, unspecified affecting right dominant side: Secondary | ICD-10-CM | POA: Diagnosis present

## 2022-03-16 DIAGNOSIS — G4700 Insomnia, unspecified: Secondary | ICD-10-CM | POA: Diagnosis present

## 2022-03-16 DIAGNOSIS — I4821 Permanent atrial fibrillation: Secondary | ICD-10-CM | POA: Diagnosis present

## 2022-03-16 DIAGNOSIS — Z7901 Long term (current) use of anticoagulants: Secondary | ICD-10-CM | POA: Diagnosis not present

## 2022-03-16 DIAGNOSIS — Z66 Do not resuscitate: Secondary | ICD-10-CM | POA: Diagnosis present

## 2022-03-16 DIAGNOSIS — E861 Hypovolemia: Secondary | ICD-10-CM | POA: Diagnosis present

## 2022-03-16 DIAGNOSIS — I119 Hypertensive heart disease without heart failure: Secondary | ICD-10-CM | POA: Diagnosis present

## 2022-03-16 DIAGNOSIS — E871 Hypo-osmolality and hyponatremia: Secondary | ICD-10-CM | POA: Diagnosis present

## 2022-03-16 DIAGNOSIS — J42 Unspecified chronic bronchitis: Secondary | ICD-10-CM | POA: Diagnosis present

## 2022-03-16 DIAGNOSIS — M810 Age-related osteoporosis without current pathological fracture: Secondary | ICD-10-CM | POA: Diagnosis present

## 2022-03-16 DIAGNOSIS — N3000 Acute cystitis without hematuria: Secondary | ICD-10-CM | POA: Diagnosis not present

## 2022-03-16 DIAGNOSIS — Q208 Other congenital malformations of cardiac chambers and connections: Secondary | ICD-10-CM

## 2022-03-16 DIAGNOSIS — E119 Type 2 diabetes mellitus without complications: Secondary | ICD-10-CM | POA: Diagnosis present

## 2022-03-16 DIAGNOSIS — I10 Essential (primary) hypertension: Secondary | ICD-10-CM | POA: Diagnosis not present

## 2022-03-16 DIAGNOSIS — I6340 Cerebral infarction due to embolism of unspecified cerebral artery: Secondary | ICD-10-CM | POA: Diagnosis present

## 2022-03-16 DIAGNOSIS — Z90710 Acquired absence of both cervix and uterus: Secondary | ICD-10-CM | POA: Diagnosis not present

## 2022-03-16 DIAGNOSIS — M170 Bilateral primary osteoarthritis of knee: Secondary | ICD-10-CM | POA: Diagnosis present

## 2022-03-16 DIAGNOSIS — Z888 Allergy status to other drugs, medicaments and biological substances status: Secondary | ICD-10-CM | POA: Diagnosis not present

## 2022-03-16 LAB — LIPID PANEL
Cholesterol: 252 mg/dL — ABNORMAL HIGH (ref 0–200)
HDL: 84 mg/dL (ref >40)
LDL Cholesterol: 154 mg/dL — ABNORMAL HIGH (ref 0–99)
Total CHOL/HDL Ratio: 3 RATIO
Triglycerides: 68 mg/dL (ref <150)
VLDL: 14 mg/dL (ref 0–40)

## 2022-03-16 LAB — ECHOCARDIOGRAM COMPLETE BUBBLE STUDY
AR max vel: 2.64 cm2
AV Area VTI: 2.5 cm2
AV Area mean vel: 2.23 cm2
AV Mean grad: 8 mmHg
AV Peak grad: 14.1 mmHg
AV Vena cont: 0.4 cm
Ao pk vel: 1.88 m/s
Area-P 1/2: 3.94 cm2
S' Lateral: 3.9 cm

## 2022-03-16 LAB — CBC
HCT: 38.2 % (ref 36.0–46.0)
Hemoglobin: 12.8 g/dL (ref 12.0–15.0)
MCH: 32.9 pg (ref 26.0–34.0)
MCHC: 33.5 g/dL (ref 30.0–36.0)
MCV: 98.2 fL (ref 80.0–100.0)
Platelets: 200 10*3/uL (ref 150–400)
RBC: 3.89 MIL/uL (ref 3.87–5.11)
RDW: 14.3 % (ref 11.5–15.5)
WBC: 4.4 10*3/uL (ref 4.0–10.5)
nRBC: 0 % (ref 0.0–0.2)

## 2022-03-16 LAB — BASIC METABOLIC PANEL
Anion gap: 10 (ref 5–15)
BUN: 15 mg/dL (ref 8–23)
CO2: 25 mmol/L (ref 22–32)
Calcium: 8.8 mg/dL — ABNORMAL LOW (ref 8.9–10.3)
Chloride: 101 mmol/L (ref 98–111)
Creatinine, Ser: 0.75 mg/dL (ref 0.44–1.00)
GFR, Estimated: >60 mL/min (ref >60)
Glucose, Bld: 109 mg/dL — ABNORMAL HIGH (ref 70–99)
Potassium: 4.1 mmol/L (ref 3.5–5.1)
Sodium: 136 mmol/L (ref 135–145)

## 2022-03-16 LAB — PHOSPHORUS: Phosphorus: 3.7 mg/dL (ref 2.5–4.6)

## 2022-03-16 LAB — MAGNESIUM: Magnesium: 2.2 mg/dL (ref 1.7–2.4)

## 2022-03-16 MED ORDER — ATORVASTATIN CALCIUM 80 MG PO TABS
80.0000 mg | ORAL_TABLET | Freq: Every day | ORAL | Status: DC
  Administered 2022-03-16 – 2022-03-20 (×5): 80 mg via ORAL
  Filled 2022-03-16: qty 1
  Filled 2022-03-16: qty 2
  Filled 2022-03-16 (×4): qty 1

## 2022-03-16 MED ORDER — ENOXAPARIN SODIUM 40 MG/0.4ML IJ SOSY
40.0000 mg | PREFILLED_SYRINGE | Freq: Every day | INTRAMUSCULAR | Status: DC
  Filled 2022-03-16: qty 0.4

## 2022-03-16 MED ORDER — ACETAMINOPHEN 325 MG PO TABS
650.0000 mg | ORAL_TABLET | Freq: Four times a day (QID) | ORAL | Status: DC | PRN
  Administered 2022-03-17 – 2022-03-19 (×2): 650 mg via ORAL
  Filled 2022-03-16 (×2): qty 2

## 2022-03-16 MED ORDER — SODIUM CHLORIDE 0.9 % IV SOLN: INTRAVENOUS | Status: DC

## 2022-03-16 MED ORDER — MEMANTINE HCL 10 MG PO TABS
10.0000 mg | ORAL_TABLET | Freq: Two times a day (BID) | ORAL | Status: DC
  Administered 2022-03-16 – 2022-03-20 (×9): 10 mg via ORAL
  Filled 2022-03-16 (×9): qty 1

## 2022-03-16 MED ORDER — SENNOSIDES-DOCUSATE SODIUM 8.6-50 MG PO TABS
1.0000 | ORAL_TABLET | Freq: Two times a day (BID) | ORAL | Status: AC
  Administered 2022-03-16 – 2022-03-17 (×4): 1 via ORAL
  Filled 2022-03-16 (×4): qty 1

## 2022-03-16 MED ORDER — ATORVASTATIN CALCIUM 40 MG PO TABS: 40.0000 mg | ORAL_TABLET | Freq: Every day | ORAL | Status: DC

## 2022-03-16 MED ORDER — DILTIAZEM HCL ER COATED BEADS 180 MG PO CP24
180.0000 mg | ORAL_CAPSULE | Freq: Every day | ORAL | Status: DC
  Administered 2022-03-16 – 2022-03-17 (×2): 180 mg via ORAL
  Filled 2022-03-16 (×2): qty 1

## 2022-03-16 MED ORDER — PROCHLORPERAZINE EDISYLATE 10 MG/2ML IJ SOLN: 5.0000 mg | Freq: Four times a day (QID) | INTRAMUSCULAR | Status: DC | PRN

## 2022-03-16 MED ORDER — ALBUTEROL SULFATE (2.5 MG/3ML) 0.083% IN NEBU: 2.5000 mg | INHALATION_SOLUTION | Freq: Four times a day (QID) | RESPIRATORY_TRACT | Status: DC | PRN

## 2022-03-16 MED ORDER — POLYETHYLENE GLYCOL 3350 17 G PO PACK: 17.0000 g | PACK | Freq: Every day | ORAL | Status: DC | PRN

## 2022-03-16 MED ORDER — SODIUM CHLORIDE 0.9 % IV SOLN
2.0000 g | INTRAVENOUS | Status: AC
  Administered 2022-03-16 – 2022-03-19 (×4): 2 g via INTRAVENOUS
  Filled 2022-03-16 (×4): qty 20

## 2022-03-16 MED ORDER — MELATONIN 3 MG PO TABS: 3.0000 mg | ORAL_TABLET | Freq: Every evening | ORAL | Status: DC | PRN

## 2022-03-16 MED ORDER — DILTIAZEM HCL-DEXTROSE 125-5 MG/125ML-% IV SOLN (PREMIX)
5.0000 mg/h | INTRAVENOUS | Status: DC
  Administered 2022-03-16: 5 mg/h via INTRAVENOUS
  Filled 2022-03-16: qty 125

## 2022-03-16 MED ORDER — LABETALOL HCL 5 MG/ML IV SOLN: 5.0000 mg | INTRAVENOUS | Status: DC | PRN

## 2022-03-16 MED ORDER — ASPIRIN 81 MG PO TBEC
81.0000 mg | DELAYED_RELEASE_TABLET | Freq: Every day | ORAL | Status: DC
  Administered 2022-03-16: 81 mg via ORAL
  Filled 2022-03-16: qty 1

## 2022-03-16 MED ORDER — IOHEXOL 350 MG/ML SOLN
75.0000 mL | Freq: Once | INTRAVENOUS | Status: AC | PRN
  Administered 2022-03-16: 75 mL via INTRAVENOUS

## 2022-03-16 NOTE — ED Notes (Signed)
Patient transported to CT scan . 

## 2022-03-16 NOTE — Evaluation (Signed)
Occupational Therapy Evaluation Patient Details Name: Bridget [name] MRN: [id number] DOB: 1921-07-24 Today's Date: 03/16/2022   History of Present Illness Bridget [name] is a 89 y.o. female who presents 03/15/2022 who presented to [facility] ED from home as a code stroke with right facial drooling and dysarthria. Acute infarcts in the left posterior frontal cortex, including the  precentral gyrus.  UA was positive for pyuri. Medical history significant for dementia requiring 24/7 assistance, permanent atrial fibrillation on Eliquis, thrombus of atrial appendage, prior history of stroke with no residual deficits, history of breast cancer status post lumpectomy, osteoporosis and bronchiectasis.   Clinical Impression   Arisbet was evaluated s/p the above admission list. She requires cognitive and physical assist at baseline and lives with her daughter and great granddaughter. Upon evaluation she was limited by impaired cognition, intermittent delirium, weakness, poor activity tolerance, R hemibody deficits and R lateral lean. Overall she required max A +2 for all mobility including standing 2x at EOB. HR to 140s with activity. Due to the deficits listed below, she also requires up to max A +2 for ADLs. Pt will benefit from continued acute OT services. Recommend d/c to [facility] for maximal functional recovery prior to d.c home with family.       Recommendations for follow up therapy are one component of a multi-disciplinary discharge planning process, led by the attending physician.  Recommendations may be updated based on patient status, additional functional criteria and insurance authorization.   Follow Up Recommendations  Acute inpatient rehab (3hours/day)     Assistance Recommended at Discharge Frequent or constant Supervision/Assistance  Patient can return home with the following Two people to help with walking and/or transfers;A lot of help with walking and/or transfers;Two people to help with  bathing/dressing/bathroom;A lot of help with bathing/dressing/bathroom;Direct supervision/assist for medications management;Direct supervision/assist for financial management;Assist for transportation;Help with stairs or ramp for entrance;Assistance with cooking/housework    Functional Status Assessment  Patient has had a recent decline in their functional status and demonstrates the ability to make significant improvements in function in a reasonable and predictable amount of time.  Equipment Recommendations  Other (comment) (defer)    Recommendations for Other Services Rehab consult     Precautions / Restrictions Precautions Precautions: Fall Restrictions Weight Bearing Restrictions: No      Mobility Bed Mobility Overal bed mobility: Needs Assistance Bed Mobility: Rolling, Sidelying to Sit     Supine to sit: Max assist Sit to supine: Max assist, +2 for safety/equipment        Transfers Overall transfer level: Needs assistance Equipment used: 2 person hand held assist Transfers: Sit to/from Stand Sit to Stand: Max assist, +2 physical assistance                  Balance Overall balance assessment: Needs assistance Sitting-balance support: Feet unsupported Sitting balance-Leahy Scale: Poor Sitting balance - Comments: Requiring minA due to right lateral lean   Standing balance support: Bilateral upper extremity supported Standing balance-Leahy Scale: Poor Standing balance comment: reliant on external support                   ADL either performed or assessed with clinical judgement   ADL Overall ADL's : Needs assistance/impaired Eating/Feeding: Minimal assistance;Sitting   Grooming: Minimal assistance;Sitting   Upper Body Bathing: Minimal assistance;Moderate assistance;Sitting   Lower Body Bathing: Maximal assistance;+2 for safety/equipment;+2 for physical assistance;Sit to/from stand   Upper Body Dressing : Moderate assistance;Sitting   Lower  Body Dressing:  Maximal assistance;+2 for physical assistance;+2 for safety/equipment;Sit to/from stand   Toilet Transfer: Maximal assistance;+2 for physical assistance;With caregiver independent assisting;Stand-pivot   Toileting- Clothing Manipulation and Hygiene: Maximal assistance;+2 for physical assistance;Sit to/from stand;+2 for safety/equipment       Functional mobility during ADLs: Maximal assistance;+2 for physical assistance;+2 for safety/equipment General ADL Comments: limited by R hemi deficits, baseline dementia and generalized weakness     Vision Baseline Vision/History: 0 No visual deficits Vision Assessment?: No apparent visual deficits     Perception Perception Perception Tested?: No   Praxis Praxis Praxis tested?: Not tested    Pertinent Vitals/Pain Pain Assessment Pain Assessment: No/denies pain     Hand Dominance Right   Extremity/Trunk Assessment Upper Extremity Assessment Upper Extremity Assessment: RUE deficits/detail RUE Deficits / Details: grossly 2/5, difficult to fully assess due to impaired cognition RUE Sensation: decreased light touch;decreased proprioception RUE Coordination: decreased fine motor;decreased gross motor   Lower Extremity Assessment Lower Extremity Assessment: Defer to PT evaluation RLE Deficits / Details: Grossly 2/5   Cervical / Trunk Assessment Cervical / Trunk Assessment: Kyphotic   Communication Communication Communication: HOH   Cognition Arousal/Alertness: Awake/alert Behavior During Therapy: WFL for tasks assessed/performed Overall Cognitive Status: History of cognitive impairments - at baseline                 General Comments: Pt following 1 step commands with increased time, joking with therapist, decreased awareness of deficits     General Comments  HR to 140s with acitvity            Home Living Family/patient expects to be discharged to:: Private residence Living Arrangements:  Children Available Help at Discharge: Family;Available 24 hours/day Type of Home: House Home Access: Stairs to enter [employer] of Steps: 3 Entrance Stairs-Rails: Left Home Layout: One level     Bathroom Shower/Tub: Teacher, early years/pre: Standard Bathroom Accessibility: Yes   Home Equipment: Rollator (4 wheels);Cane - single point;Grab bars - tub/shower;Grab bars - toilet;Toilet riser;Tub bench          Prior Functioning/Environment Prior Level of Function : Needs assist             Mobility Comments: Pt uses rollator at baseline for limited household ambulation and SPC + railing for stairs with guarding from daughter ADLs Comments: assist/supervision for safety at all times with ADL's        OT Problem List: Decreased strength;Decreased range of motion;Decreased activity tolerance;Impaired balance (sitting and/or standing);Decreased safety awareness;Decreased knowledge of precautions      OT Treatment/Interventions: Self-care/ADL training;Therapeutic exercise;DME and/or AE instruction;Therapeutic activities;Patient/family education;Balance training    OT Goals(Current goals can be found in the care plan section) Acute Rehab OT Goals Patient Stated Goal: to lay down OT Goal Formulation: With patient Time For Goal Achievement: 03/30/22 Potential to Achieve Goals: Good ADL Goals Pt Will Perform Upper Body Dressing: with min assist;sitting Pt Will Perform Lower Body Dressing: with mod assist;sit to/from stand Pt Will Transfer to Toilet: with mod assist;stand pivot transfer;bedside commode  OT Frequency: Min 2X/week    Co-evaluation PT/OT/SLP Co-Evaluation/Treatment: Yes Reason for Co-Treatment: For patient/therapist safety;To address functional/ADL transfers;Complexity of the patient's impairments (multi-system involvement) PT goals addressed during session: Mobility/safety with mobility;Balance OT goals addressed during session: ADL's and  self-care      AM-PAC OT "6 Clicks" Daily Activity     Outcome Measure Help from another person eating meals?: A Little Help from another person taking care of personal grooming?:  A Little Help from another person toileting, which includes using toliet, bedpan, or urinal?: A Lot Help from another person bathing (including washing, rinsing, drying)?: A Lot Help from another person to put on and taking off regular upper body clothing?: A Lot Help from another person to put on and taking off regular lower body clothing?: A Lot 6 Click Score: 14   End of Session Nurse Communication: Mobility status  Activity Tolerance: Patient tolerated treatment well Patient left: in bed;with call bell/phone within reach;with family/visitor present  OT Visit Diagnosis: Unsteadiness on feet (R26.81);Other abnormalities of gait and mobility (R26.89);Muscle weakness (generalized) (M62.81);Pain                Time: [id number] OT Time Calculation (min): 28 min Charges:  OT General Charges $OT Visit: 1 Visit OT Evaluation $OT Eval Moderate Complexity: 1 Mod  Shade Flood, OTR/L Acute Rehabilitation Services Office [place] Communication Preferred   Elliot Cousin 03/16/2022, 3:47 PM

## 2022-03-16 NOTE — ED Notes (Signed)
Patient transported to MRI 

## 2022-03-16 NOTE — Progress Notes (Addendum)
STROKE TEAM PROGRESS NOTE   INTERVAL HISTORY Her granddaughters are at the bedside with her daughter on facetime.  Her speech is back to baseline, no facial droop. Resume eliquis, follow up with cardiology outpatient unless echo is concerning. Family states she is close to her baseline, she is slightly more confused than normal but did not sleep overnight.   Vitals:   03/16/22 0338 03/16/22 0545 03/16/22 0630 03/16/22 0700  BP:  139/73 (!) 144/91 (!) 174/96  Pulse:  79 82 80  Resp:  18 (!) 23 (!) 22  Temp: 98.2 F (36.8 C)     TempSrc: Oral     SpO2:  96% 95% 96%  Weight:      Height:       CBC:  Recent Labs  Lab 03/15/22 2220 03/15/22 2228 03/16/22 0450  WBC 5.5  --  4.4  NEUTROABS 2.9  --   --   HGB 12.6 12.9 12.8  HCT 37.8 38.0 38.2  MCV 100.0  --  98.2  PLT 210  --  [id number]   Basic Metabolic Panel:  Recent Labs  Lab 03/15/22 2220 03/15/22 2228 03/16/22 0450  NA 130* 130* 136  K 4.9 4.9 4.1  CL 95* 97* 101  CO2 23  --  25  GLUCOSE 92 92 109*  BUN '17 22 15  '$ CREATININE 0.81 0.90 0.75  CALCIUM 9.3  --  8.8*  MG  --   --  2.2  PHOS  --   --  3.7   Lipid Panel:  Recent Labs  Lab 03/16/22 0144  CHOL 252*  TRIG 68  HDL 84  CHOLHDL 3.0  VLDL 14  LDLCALC 154*   HgbA1c: No results for input(s): "HGBA1C" in the last 168 hours. Urine Drug Screen: No results for input(s): "LABOPIA", "COCAINSCRNUR", "LABBENZ", "AMPHETMU", "THCU", "LABBARB" in the last 168 hours.  Alcohol Level  Recent Labs  Lab 03/15/22 2220  ETH <10    IMAGING past 24 hours CT ABDOMEN PELVIS W CONTRAST  Result Date: 03/16/2022 CLINICAL DATA:  Abdominal pain. EXAM: CT ABDOMEN AND PELVIS WITH CONTRAST TECHNIQUE: Multidetector CT imaging of the abdomen and pelvis was performed using the standard protocol following bolus administration of intravenous contrast. RADIATION DOSE REDUCTION: This exam was performed according to the departmental dose-optimization program which includes automated exposure  control, adjustment of the mA and/or kV according to patient size and/or use of iterative reconstruction technique. CONTRAST:  2m OMNIPAQUE IOHEXOL 350 MG/ML SOLN COMPARISON:  None Available. FINDINGS: Evaluation of this exam is limited due to respiratory motion as well as loose stool streak artifact caused by patient's arms. Lower chest: There are bibasilar interstitial coarsening. There is moderate cardiomegaly. Three vessel coronary vascular calcification. No intra-abdominal free air for free fluid. Hepatobiliary: Several small liver cysts. The liver is otherwise unremarkable. No biliary ductal dilatation. The gallbladder is unremarkable. Pancreas: The pancreas is unremarkable as visualized. Spleen: Normal in size without focal abnormality. Adrenals/Urinary Tract: The adrenal glands unremarkable. There is no hydronephrosis on either side. There is symmetric enhancement and excretion of contrast by both kidneys. The visualized ureters and urinary bladder appear unremarkable. Stomach/Bowel: There is moderate stool throughout the colon. There are scattered sigmoid diverticula without active inflammatory changes. There is no bowel obstruction or active inflammation. The appendix is not visualized with certainty. No inflammatory changes identified in the right lower quadrant. Vascular/Lymphatic: The aorta is tortuous. There is advanced aortoiliac atherosclerotic disease. An infrarenal IVC filter is noted. No portal venous gas.  There is no adenopathy. Reproductive: The uterus is not visualized, likely surgically absent. There is a 3.2 cm right ovarian cyst. Soft tissue associated with the cyst likely represent the ovarian tissue. This can be better evaluated with ultrasound on a nonemergent/outpatient basis. The left ovary is not identified with certainty. Other: None Musculoskeletal: Osteopenia with degenerative changes of the spine. Bilateral femoral neck ORIF. No acute osseous pathology. IMPRESSION: 1. No acute  intra-abdominal or pelvic pathology. 2. Constipation.  No bowel obstruction. 3. Sigmoid diverticulosis. 4. A 3.2 cm right ovarian cyst. This can be better evaluated with ultrasound on a nonemergent/outpatient basis. 5.  Aortic Atherosclerosis (ICD10-I70.0). Electronically Signed   By: Anner Crete M.D.   On: 03/16/2022 01:17   MR BRAIN WO CONTRAST  Result Date: 03/16/2022 CLINICAL DATA:  Neuro deficit, stroke suspected EXAM: MRI HEAD WITHOUT CONTRAST TECHNIQUE: Multiplanar, multiecho pulse sequences of the brain and surrounding structures were obtained without intravenous contrast. COMPARISON:  No prior MRI available, correlation is made with CT head 03/15/2022 FINDINGS: Brain: Restricted diffusion with ADC correlate in the left posterior frontal cortex, including the precentral gyrus (series 5, images 82-87), consistent with acute infarcts. This areas not associated with significantly increased T2 hyperintense signal. Additional smaller foci of mildly hyperintense signal on diffusion-weighted imaging without definite ADC correlate in the more anterior left frontal lobe may represent more subacute infarcts (series 5, images 82-83). No acute hemorrhage, mass, mass effect, or midline shift. No hydrocephalus or extra-axial collection. Encephalomalacia in the right greater than left frontal lobes, consistent with remote infarcts. Age related cerebral atrophy, with slightly more pronounced atrophy in the bilateral medial temporal lobes. T2 hyperintense signal in the periventricular white matter, likely the sequela of moderate chronic small vessel ischemic disease. Vascular: Normal arterial flow voids. Skull and upper cervical spine: Normal marrow signal. Sinuses/Orbits: Clear paranasal sinuses. Status post bilateral lens replacements. Other: Fluid in the mastoid air cells. IMPRESSION: Acute infarcts in the left posterior frontal cortex, including the precentral gyrus. Additional smaller subacute infarcts in the  more anterior left frontal lobe. Imaging results were communicated on 03/16/2022 at 12:43 am to provider Dr. Rory Percy via secure text paging. Electronically Signed   By: Merilyn Baba M.D.   On: 03/16/2022 00:47   DG Chest Port 1 View  Result Date: 03/15/2022 CLINICAL DATA:  Screening EXAM: PORTABLE CHEST 1 VIEW COMPARISON:  Radiographs 01/16/2022 FINDINGS: No change from 01/16/2022. Airspace opacity in the right lower lung may represent chronic middle lobe collapse as there is slight rightward deviation of the mediastinum. Cardiomegaly. Aortic atherosclerotic calcification. Chronic bronchitic change. No displaced rib fractures. IMPRESSION: No change from 01/16/2022. Airspace opacity in the right lower lung may represent chronic middle lobe collapse as there is slight rightward deviation of the mediastinum. Electronically Signed   By: Placido Sou M.D.   On: 03/15/2022 23:54   CT ANGIO HEAD NECK W WO CM (CODE STROKE)  Result Date: 03/15/2022 CLINICAL DATA:  Stroke suspected EXAM: CT ANGIOGRAPHY HEAD AND NECK TECHNIQUE: Multidetector CT imaging of the head and neck was performed using the standard protocol during bolus administration of intravenous contrast. Multiplanar CT image reconstructions and MIPs were obtained to evaluate the vascular anatomy. Carotid stenosis measurements (when applicable) are obtained utilizing NASCET criteria, using the distal internal carotid diameter as the denominator. RADIATION DOSE REDUCTION: This exam was performed according to the departmental dose-optimization program which includes automated exposure control, adjustment of the mA and/or kV according to patient size and/or use of iterative reconstruction  technique. CONTRAST:  96m OMNIPAQUE IOHEXOL 350 MG/ML SOLN COMPARISON:  No prior CTA available, correlation is made with CT head 03/15/2022 FINDINGS: CT HEAD FINDINGS For noncontrast findings, please see same day CT head. CTA NECK FINDINGS Aortic arch: Incompletely imaged, but  standard aortic branching is suspected. Imaged portion shows no evidence of aneurysm or dissection. No significant stenosis of the major arch vessel origins. Right carotid system: No evidence of dissection, occlusion, or hemodynamically significant stenosis (greater than 50%). Atherosclerotic disease at the bifurcation and in the proximal ICA is not hemodynamically significant. Tortuous distal ICAs. Left carotid system: No evidence of dissection, occlusion, or hemodynamically significant stenosis (greater than 50%). Atherosclerotic disease at the bifurcation and in the proximal ICA is not hemodynamically significant. Vertebral arteries: No evidence of dissection, occlusion, or hemodynamically significant stenosis (greater than 50%). The left vertebral artery is diminutive throughout its course. Skeleton: No acute osseous abnormality. Severe degenerative changes in the cervical spine, with reversal of the normal cervical lordosis. Other neck: Negative. Upper chest: No focal pulmonary opacity or pleural effusion. Review of the MIP images confirms the above findings CTA HEAD FINDINGS Anterior circulation: Both internal carotid arteries are patent to the termini, with mild stenosis in the bilateral supraclinoid segments. Patent right A1. Severely hypoplastic left A1. Normal anterior communicating artery. Severe stenosis in the left A2 segment (series 7, images 70-72). Anterior cerebral arteries are otherwise patent to their distal aspects. No M1 stenosis or occlusion. MCA branches perfused and symmetric. Posterior circulation: The left vertebral artery terminates in PICA. The right vertebral artery is patent to the vertebrobasilar junction without stenosis. Posterior inferior cerebellar arteries patent proximally. Basilar patent to its distal aspect. Superior cerebellar arteries patent proximally. Patent P1 segments. PCAs perfused to their distal aspects without stenosis. The bilateral posterior communicating arteries  are not visualized. Venous sinuses: As permitted by contrast timing, patent. Anatomic variants: None significant. Review of the MIP images confirms the above findings IMPRESSION: 1. No intracranial large vessel occlusion. Severe stenosis in the left A2 segment. 2. No hemodynamically significant stenosis in the neck. Imaging results were communicated on 03/15/2022 at 11:26 pm to provider Dr. ARory Percyvia secure text paging. Electronically Signed   By: AMerilyn BabaM.D.   On: 03/15/2022 23:27   CT HEAD CODE STROKE WO CONTRAST  Result Date: 03/15/2022 CLINICAL DATA:  Code stroke. EXAM: CT HEAD WITHOUT CONTRAST TECHNIQUE: Contiguous axial images were obtained from the base of the skull through the vertex without intravenous contrast. RADIATION DOSE REDUCTION: This exam was performed according to the departmental dose-optimization program which includes automated exposure control, adjustment of the mA and/or kV according to patient size and/or use of iterative reconstruction technique. COMPARISON:  03/22/2020 FINDINGS: Brain: No evidence of acute infarction, hemorrhage, mass, mass effect, or midline shift. No hydrocephalus or extra-axial collection. Redemonstrated right greater than left frontal cortical infarcts. Periventricular white matter changes, likely the sequela of chronic small vessel ischemic disease. Vascular: No hyperdense vessel. Atherosclerotic calcifications in the intracranial carotid and vertebral arteries. Skull: Negative for fracture or focal lesion. Sinuses/Orbits: No acute finding. Status post bilateral lens replacements. Other: The mastoid air cells are well aerated. [facility]PECTS [facility] - Ganglionic level infarction (caudate, lentiform nuclei, internal capsule, insula, M1-M3 cortex): 7 - Supraganglionic infarction (M4-M6 cortex): 3 Total score (0-10 with 10 being normal): 10 IMPRESSION: 1. No acute intracranial process. 2. ASPECTS is 10. Imaging results were communicated  on 03/15/2022 at 10:30 pm to provider Dr. ARory Percyvia  secure text paging. Electronically Signed   By: Merilyn Baba M.D.   On: 03/15/2022 22:30    PHYSICAL EXAM Awake alert oriented to self and birth date. Unable to tell me the current month or location. Does not recognize family members without prompting.  Able to follow simple commands. Able to name simple objects. Poor historian for medical care.  Able to repeat sentences. No dysarthria Eyes are midline, PERRL, no facial droop, facial sensation is intact. shoulder strength equal, tongue protrudes midline Elevates upper extremities antigravity. Easily distracted by monitoring cords during exam. Elevates bilateral lower extremities antigravity, left appears weaker than right Sensation intact to light touch Slight dysmetria in right upper extremity   ASSESSMENT/PLAN Ms. Bridget [name] is a 89 y.o. 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 She lives with her daughter who is her full time caregiver.   Stroke:  acute infarcts in the left precentral cyrus and left posterior frontal lobe Etiology:  likely embolic in the setting of atrial fibrillation on eliquis   Code Stroke CT head No acute abnormality. ASPECTS 10.    CTA head & neck No intracranial large vessel occlusion. Severe stenosis in the left A2 segment. 2. No hemodynamically significant stenosis in the neck. MRI  Acute infarcts in the left posterior frontal cortex, including the precentral gyrus. Additional smaller subacute infarcts in the more anterior left frontal lobe. 2D Echo Pending LDL 154 HgbA1c No results found for requested labs within last 1095 days. VTE prophylaxis - lovenox    Diet   Diet NPO time specified   Eliquis (apixaban) daily prior to admission, now on Eliquis (apixaban) daily. Family is hesitant to change anticoagulation. She has  tolerated elquis in the setting of afib and her known LA thrombus Therapy recommendations:  AIR Currently receiving therapy three times per week Disposition:  Pending- from   Atrial Fibrillation LA thrombus Home meds: Eliquis 2.'5mg'$  BID, diltiazem  Hypertension Stable Allow for permissive hypertension for the first 24-48h - only treat PRN if SBP >220 mmHg. Blood pressures can be gradually normalized to SBP<140 upon discharge.   Hyperlipidemia LDL 154, goal < 70 Add Atorvastatin '80mg'$  Continue statin at discharge  Diabetes type II Controlled Home meds:  none HgbA1c pending., goal < 7.0 CBGs Recent Labs    03/15/22 2216  GLUCAP 109*    SSI  Other Stroke Risk Factors Advanced Age >/= 24   Other Active Problems Urinary Tract Infection UA with large leukocytes, positive nitrates  Hospital day # 0  Patient seen and examined by NP/APP with MD. MD to update note as needed.   Janine Ores, DNP, FNP-BC [facility] Pager: [phone]  ATTENDING ATTESTATION:  89 year old with atrial fibrillation, history of cardiac thrombus for many years chronically anticoagulated used to be on Pradaxa then switched to Eliquis which seem to work better per family and cardiac thrombus size was decreasing.  On exam she has dense right upper extremity weakness.  MRI imaging was reviewed with multiple family members.  They wish to continue Eliquis for now and discuss further with her cardiologist.  Stroke workup is ongoing.  Echo is pending.  TEE was recommended by outpatient cardiologist previously but family refused.  Therapy is recommending acute inpatient rehab.  Dr. Reeves Forth evaluated pt independently, reviewed imaging, chart, labs. Discussed and formulated plan with the Resident/APP. Changes were made to the note where appropriate. Please see  APP/resident note above for details.     Izear Pine,MD   To contact Stroke Continuity provider, please refer to [email or web address] After  hours, contact General Neurology

## 2022-03-16 NOTE — Progress Notes (Signed)
[facility] Progress Note  Patient: Bridget [name]     [id number]  DOA: 03/15/2022   PCP: Haywood Pao, MD       Brief hospital course: This is a 89y/o female with dementia, A-fib, thrombus of the atrial appendage, CVA, breast cancer who presented to the ED for a left facial droop, drooling and dysarthria. She lives with her daughter and has a niece who is hired caretaker and has 24 hr care. She takes Eliquis daily.   MRI revealed infarct in the left post frontal cortex.   Subjective:  Confused- no complaints Assessment and Plan: Principal Problem:   CVA (cerebral vascular accident)  - has right hemiparesis and right facial droop- 2 person assist- needs CIR - I have spoken with Haroldine Laws who lives with her mother- she is in agreement with CIR - LDL 154 - 2 D ECHO pending - f/u on stroke team recommendations    Active Problems:  A-fib - cont Diltiazem- Eliquis on hold- awaiting stroke team recommendations  UTI?  - no leukocytosis or fever - dc Ceftriaxone and resume Macrodantin    Dementia ([place]) - at baseline     Code Status: DNR Consultants: Neuro Level of Care: Level of care: Telemetry Medical Total time on patient care: 35 DVT prophylaxis:  enoxaparin (LOVENOX) injection 40 mg Start: 03/16/22 1000     Objective:   Vitals:   03/16/22 0816 03/16/22 1001 03/16/22 1353 03/16/22 1616  BP:  (!) 178/72 (!) 178/72 (!) 159/83  Pulse:  99 (!) 113   Resp:  (!) 21 (!) 23   Temp: 98.2 F (36.8 C) 98.2 F (36.8 C) 98.2 F (36.8 C) 98.4 F (36.9 C)  TempSrc: Oral Oral Oral Oral  SpO2:  98% 95%   Weight:      Height:       Filed Weights   03/15/22 2200  Weight: 62.8 kg   Exam: General exam: Appears comfortable  Neuro: - confused to time and place- right leg 3/5, right arm 4/5 HEENT: oral mucosa moist Respiratory system: Clear to auscultation.  Cardiovascular system: S1 & S2 heard  Gastrointestinal system: Abdomen soft, non-tender,  nondistended. Normal bowel sounds   Extremities: No cyanosis, clubbing or edema Psychiatry:  Mood & affect appropriate.      CBC: Recent Labs  Lab 03/15/22 2220 03/15/22 2228 03/16/22 0450  WBC 5.5  --  4.4  NEUTROABS 2.9  --   --   HGB 12.6 12.9 12.8  HCT 37.8 38.0 38.2  MCV 100.0  --  98.2  PLT 210  --  [id number]   Basic Metabolic Panel: Recent Labs  Lab 03/15/22 2220 03/15/22 2228 03/16/22 0450  NA 130* 130* 136  K 4.9 4.9 4.1  CL 95* 97* 101  CO2 23  --  25  GLUCOSE 92 92 109*  BUN '17 22 15  '$ CREATININE 0.81 0.90 0.75  CALCIUM 9.3  --  8.8*  MG  --   --  2.2  PHOS  --   --  3.7   GFR: Estimated Creatinine Clearance: 38.2 mL/min (by C-G formula based on SCr of 0.75 mg/dL).  Scheduled Meds:  aspirin EC  81 mg Oral Daily   atorvastatin  80 mg Oral Daily   enoxaparin (LOVENOX) injection  40 mg Subcutaneous Daily   memantine  10 mg Oral BID   senna-docusate  1 tablet Oral BID   sodium chloride flush  3 mL Intravenous Once   Continuous Infusions:  sodium chloride 50 mL/hr at 03/16/22 0341   cefTRIAXone (ROCEPHIN)  IV Stopped (03/16/22 1204)   Imaging and lab data was personally reviewed CT ABDOMEN PELVIS W CONTRAST  Result Date: 03/16/2022 CLINICAL DATA:  Abdominal pain. EXAM: CT ABDOMEN AND PELVIS WITH CONTRAST TECHNIQUE: Multidetector CT imaging of the abdomen and pelvis was performed using the standard protocol following bolus administration of intravenous contrast. RADIATION DOSE REDUCTION: This exam was performed according to the departmental dose-optimization program which includes automated exposure control, adjustment of the mA and/or kV according to patient size and/or use of iterative reconstruction technique. CONTRAST:  4m OMNIPAQUE IOHEXOL 350 MG/ML SOLN COMPARISON:  None Available. FINDINGS: Evaluation of this exam is limited due to respiratory motion as well as loose stool streak artifact caused by patient's arms. Lower chest: There are bibasilar  interstitial coarsening. There is moderate cardiomegaly. Three vessel coronary vascular calcification. No intra-abdominal free air for free fluid. Hepatobiliary: Several small liver cysts. The liver is otherwise unremarkable. No biliary ductal dilatation. The gallbladder is unremarkable. Pancreas: The pancreas is unremarkable as visualized. Spleen: Normal in size without focal abnormality. Adrenals/Urinary Tract: The adrenal glands unremarkable. There is no hydronephrosis on either side. There is symmetric enhancement and excretion of contrast by both kidneys. The visualized ureters and urinary bladder appear unremarkable. Stomach/Bowel: There is moderate stool throughout the colon. There are scattered sigmoid diverticula without active inflammatory changes. There is no bowel obstruction or active inflammation. The appendix is not visualized with certainty. No inflammatory changes identified in the right lower quadrant. Vascular/Lymphatic: The aorta is tortuous. There is advanced aortoiliac atherosclerotic disease. An infrarenal IVC filter is noted. No portal venous gas. There is no adenopathy. Reproductive: The uterus is not visualized, likely surgically absent. There is a 3.2 cm right ovarian cyst. Soft tissue associated with the cyst likely represent the ovarian tissue. This can be better evaluated with ultrasound on a nonemergent/outpatient basis. The left ovary is not identified with certainty. Other: None Musculoskeletal: Osteopenia with degenerative changes of the spine. Bilateral femoral neck ORIF. No acute osseous pathology. IMPRESSION: 1. No acute intra-abdominal or pelvic pathology. 2. Constipation.  No bowel obstruction. 3. Sigmoid diverticulosis. 4. A 3.2 cm right ovarian cyst. This can be better evaluated with ultrasound on a nonemergent/outpatient basis. 5.  Aortic Atherosclerosis (ICD10-I70.0). Electronically Signed   By: AAnner CreteM.D.   On: 03/16/2022 01:17   MR BRAIN WO CONTRAST  Result  Date: 03/16/2022 CLINICAL DATA:  Neuro deficit, stroke suspected EXAM: MRI HEAD WITHOUT CONTRAST TECHNIQUE: Multiplanar, multiecho pulse sequences of the brain and surrounding structures were obtained without intravenous contrast. COMPARISON:  No prior MRI available, correlation is made with CT head 03/15/2022 FINDINGS: Brain: Restricted diffusion with ADC correlate in the left posterior frontal cortex, including the precentral gyrus (series 5, images 82-87), consistent with acute infarcts. This areas not associated with significantly increased T2 hyperintense signal. Additional smaller foci of mildly hyperintense signal on diffusion-weighted imaging without definite ADC correlate in the more anterior left frontal lobe may represent more subacute infarcts (series 5, images 82-83). No acute hemorrhage, mass, mass effect, or midline shift. No hydrocephalus or extra-axial collection. Encephalomalacia in the right greater than left frontal lobes, consistent with remote infarcts. Age related cerebral atrophy, with slightly more pronounced atrophy in the bilateral medial temporal lobes. T2 hyperintense signal in the periventricular white matter, likely the sequela of moderate chronic small vessel ischemic disease. Vascular: Normal arterial flow voids. Skull and upper cervical spine: Normal marrow signal. Sinuses/Orbits: Clear  paranasal sinuses. Status post bilateral lens replacements. Other: Fluid in the mastoid air cells. IMPRESSION: Acute infarcts in the left posterior frontal cortex, including the precentral gyrus. Additional smaller subacute infarcts in the more anterior left frontal lobe. Imaging results were communicated on 03/16/2022 at 12:43 am to provider Dr. Rory Percy via secure text paging. Electronically Signed   By: Merilyn Baba M.D.   On: 03/16/2022 00:47   DG Chest Port 1 View  Result Date: 03/15/2022 CLINICAL DATA:  Screening EXAM: PORTABLE CHEST 1 VIEW COMPARISON:  Radiographs 01/16/2022 FINDINGS: No  change from 01/16/2022. Airspace opacity in the right lower lung may represent chronic middle lobe collapse as there is slight rightward deviation of the mediastinum. Cardiomegaly. Aortic atherosclerotic calcification. Chronic bronchitic change. No displaced rib fractures. IMPRESSION: No change from 01/16/2022. Airspace opacity in the right lower lung may represent chronic middle lobe collapse as there is slight rightward deviation of the mediastinum. Electronically Signed   By: Placido Sou M.D.   On: 03/15/2022 23:54   CT ANGIO HEAD NECK W WO CM (CODE STROKE)  Result Date: 03/15/2022 CLINICAL DATA:  Stroke suspected EXAM: CT ANGIOGRAPHY HEAD AND NECK TECHNIQUE: Multidetector CT imaging of the head and neck was performed using the standard protocol during bolus administration of intravenous contrast. Multiplanar CT image reconstructions and MIPs were obtained to evaluate the vascular anatomy. Carotid stenosis measurements (when applicable) are obtained utilizing NASCET criteria, using the distal internal carotid diameter as the denominator. RADIATION DOSE REDUCTION: This exam was performed according to the departmental dose-optimization program which includes automated exposure control, adjustment of the mA and/or kV according to patient size and/or use of iterative reconstruction technique. CONTRAST:  44m OMNIPAQUE IOHEXOL 350 MG/ML SOLN COMPARISON:  No prior CTA available, correlation is made with CT head 03/15/2022 FINDINGS: CT HEAD FINDINGS For noncontrast findings, please see same day CT head. CTA NECK FINDINGS Aortic arch: Incompletely imaged, but standard aortic branching is suspected. Imaged portion shows no evidence of aneurysm or dissection. No significant stenosis of the major arch vessel origins. Right carotid system: No evidence of dissection, occlusion, or hemodynamically significant stenosis (greater than 50%). Atherosclerotic disease at the bifurcation and in the proximal ICA is not  hemodynamically significant. Tortuous distal ICAs. Left carotid system: No evidence of dissection, occlusion, or hemodynamically significant stenosis (greater than 50%). Atherosclerotic disease at the bifurcation and in the proximal ICA is not hemodynamically significant. Vertebral arteries: No evidence of dissection, occlusion, or hemodynamically significant stenosis (greater than 50%). The left vertebral artery is diminutive throughout its course. Skeleton: No acute osseous abnormality. Severe degenerative changes in the cervical spine, with reversal of the normal cervical lordosis. Other neck: Negative. Upper chest: No focal pulmonary opacity or pleural effusion. Review of the MIP images confirms the above findings CTA HEAD FINDINGS Anterior circulation: Both internal carotid arteries are patent to the termini, with mild stenosis in the bilateral supraclinoid segments. Patent right A1. Severely hypoplastic left A1. Normal anterior communicating artery. Severe stenosis in the left A2 segment (series 7, images 70-72). Anterior cerebral arteries are otherwise patent to their distal aspects. No M1 stenosis or occlusion. MCA branches perfused and symmetric. Posterior circulation: The left vertebral artery terminates in PICA. The right vertebral artery is patent to the vertebrobasilar junction without stenosis. Posterior inferior cerebellar arteries patent proximally. Basilar patent to its distal aspect. Superior cerebellar arteries patent proximally. Patent P1 segments. PCAs perfused to their distal aspects without stenosis. The bilateral posterior communicating arteries are not visualized. Venous sinuses: As  permitted by contrast timing, patent. Anatomic variants: None significant. Review of the MIP images confirms the above findings IMPRESSION: 1. No intracranial large vessel occlusion. Severe stenosis in the left A2 segment. 2. No hemodynamically significant stenosis in the neck. Imaging results were communicated  on 03/15/2022 at 11:26 pm to provider Dr. Rory Percy via secure text paging. Electronically Signed   By: Merilyn Baba M.D.   On: 03/15/2022 23:27   CT HEAD CODE STROKE WO CONTRAST  Result Date: 03/15/2022 CLINICAL DATA:  Code stroke. EXAM: CT HEAD WITHOUT CONTRAST TECHNIQUE: Contiguous axial images were obtained from the base of the skull through the vertex without intravenous contrast. RADIATION DOSE REDUCTION: This exam was performed according to the departmental dose-optimization program which includes automated exposure control, adjustment of the mA and/or kV according to patient size and/or use of iterative reconstruction technique. COMPARISON:  03/22/2020 FINDINGS: Brain: No evidence of acute infarction, hemorrhage, mass, mass effect, or midline shift. No hydrocephalus or extra-axial collection. Redemonstrated right greater than left frontal cortical infarcts. Periventricular white matter changes, likely the sequela of chronic small vessel ischemic disease. Vascular: No hyperdense vessel. Atherosclerotic calcifications in the intracranial carotid and vertebral arteries. Skull: Negative for fracture or focal lesion. Sinuses/Orbits: No acute finding. Status post bilateral lens replacements. Other: The mastoid air cells are well aerated. ASPECTS [facility] Stroke Program Early CT Score) - Ganglionic level infarction (caudate, lentiform nuclei, internal capsule, insula, M1-M3 cortex): 7 - Supraganglionic infarction (M4-M6 cortex): 3 Total score (0-10 with 10 being normal): 10 IMPRESSION: 1. No acute intracranial process. 2. ASPECTS is 10. Imaging results were communicated on 03/15/2022 at 10:30 pm to provider Dr. Rory Percy via secure text paging. Electronically Signed   By: Merilyn Baba M.D.   On: 03/15/2022 22:30    LOS: 0 days   Author: Debbe Odea  03/16/2022 5:36 PM  To contact [facility]>   Check the care team in [facility] and look for the attending/consulting [place] provider listed  Log into [email or web address] and  use [place]'s universal password   Go to> "[facility]"  and find provider  If you still have difficulty reaching the provider, please page the [facility] (Director on Call) for the Hospitalists listed on amion

## 2022-03-16 NOTE — Consult Note (Signed)
CARDIOLOGY CONSULT NOTE  Patient ID: Bridget [name] MRN: [id number] DOB/AGE: 02-01-1921 89 y.o.  Admit date: 03/15/2022 Attending physician: Kayleen Memos, DO Primary Physician:  Haywood Pao, MD Outpatient Cardiology Provider: Rex Kras, DO, [facility] Inpatient Cardiologist: Rex Kras, DO, [facility]  Reason of consultation: Atrial fibrillation Referring physician: Dr. Egbert Garibaldi  Chief complaint: slurred speech and right sided weakness  HPI:  Bridget [name] is a 89 y.o. Caucasian female who presents with a chief complaint of " slurred speech and right-sided weakness." Her past medical history and cardiovascular risk factors include: Atrial fibrillation, hx of stroke (2017), severe coronary calcification (1809, 94th percentile, CT pulmonary vein protocol June 2023), history of breast cancer, osteoporosis bilateral hip fractures, dementia (per daughter), thrombus of the left atrial appendage (CT scan ), pulmonary nodule, advanced age, post menopausal female.   In the past patient had a CT chest for chronic cough which noted filling defect in the left atrial appendage to suggest possible thrombus.  She was requested to undergo transesophageal echocardiogram for more definitive diagnoses but given her advanced age shared decision was to hold off invasive testing.  At that time Pradaxa was changed to Eliquis and she had a repeat CT which noted improvement in the size of a filling defect.  Has been on medical therapy for atrial fibrillation and at the last office visit given her age greater than 23 years and weight less than 60 kg Eliquis was dosed at 2.5 mg twice daily.  She now presents to the ED with slurred speech, right-sided weakness and diagnosed with acute CVA.  Cardiology is asked to evaluate/manage atrial fibrillation with rapid ventricular rates.  At the time of evaluation she is accompanied by her daughter and son at bedside.  Patient herself is a poor historian and HPI is obtained as a  result of reviewing medical records and talking to her kids.  She currently denies anginal discomfort or heart failure symptoms.  ALLERGIES: Allergies  Allergen Reactions   Pulmicort [Budesonide] Swelling    Eye swelling, ankle and knee swelling   Erythromycin Other (See Comments)    Unknown, but "all the mycins bother me"   Roxicodone [Oxycodone] Other (See Comments)    Confusion    Ultram [Tramadol] Other (See Comments)    Confusion     PAST MEDICAL HISTORY: Past Medical History:  Diagnosis Date   Atherosclerosis of aorta ([place])    Atrial fibrillation ([place])    Breast cancer ([place])    Chronic Bronchitis    Coronary atherosclerosis due to calcified coronary lesion    Nocturnal polyuria    Stroke ([facility])    Thrombus of left atrial appendage     PAST SURGICAL HISTORY: Past Surgical History:  Procedure Laterality Date   BREAST LUMPECTOMY Left 2001   FEMUR FRACTURE SURGERY Right 2019   RIGHT - 2015   TOTAL ABDOMINAL HYSTERECTOMY  1967    FAMILY HISTORY: The patient's family history includes Other in her sister.   SOCIAL HISTORY:  The patient  reports that she quit smoking about 74 years ago. Her smoking use included cigarettes. She has a 10.50 pack-year smoking history. She has never used smokeless tobacco. She reports that she does not drink alcohol and does not use drugs.  MEDICATIONS: Current Outpatient Medications  Medication Instructions   acetaminophen (TYLENOL) 1,000 mg, Daily PRN   albuterol (VENTOLIN HFA) 108 (90 Base) MCG/ACT inhaler 2 puffs, Every 6 hours PRN   B Complex Vitamins (B COMPLEX PO) 1  capsule, Oral, Daily   benzonatate (TESSALON) 200 mg, Oral, 3 times daily PRN   Besifloxacin HCl (BESIVANCE) 0.6 % SUSP 1 drop, Both Eyes, See admin instructions, Instill 1 drop into each eye 4 times daily for 2 days after eye injections   Budeson-Glycopyrrol-Formoterol (BREZTRI AEROSPHERE) 160-9-4.8 MCG/ACT AERO 2 puffs, Inhalation, 2 times daily   D-Mannose  (URITRAX) POWD 1 Scoop, Daily   D-MANNOSE PO 2 tablets, 2 times daily   diltiazem (TIAZAC) 180 mg, Oral, Daily   Eliquis 2.5 mg, Oral, 2 times daily   furosemide (LASIX) 20 mg, Daily PRN   ketoconazole (NIZORAL) 2 % shampoo 1 Application, See admin instructions   memantine (NAMENDA) 10 MG tablet 1 tablet, Oral, 2 times daily   Multiple Vitamins-Minerals (PRESERVISION AREDS 2) CAPS 1 capsule, 2 times daily   nitrofurantoin (MACRODANTIN) 50 mg, Oral, Daily at bedtime   Omega-3 Fatty Acids (FISH OIL PO) 15 mLs, Daily at bedtime   OVER THE COUNTER MEDICATION 1 each, Oral, Every morning, CBD gummy   OVER THE COUNTER MEDICATION 1 each, Oral, Daily at bedtime, CBD + melatonin gummy   OVER THE COUNTER MEDICATION 1 Scoop, Daily   Prolia 60 mg, See admin instructions   Resveratrol 250 MG CAPS 1 capsule, 2 times daily   senna (SENOKOT) 8.6 MG tablet 1 tablet, Daily PRN   Turmeric (QC TUMERIC COMPLEX PO) Take by mouth. Unsure of dosage    REVIEW OF SYSTEMS: Review of Systems  Constitutional: Positive for malaise/fatigue.  Cardiovascular:  Negative for chest pain, claudication, dyspnea on exertion, irregular heartbeat, leg swelling, near-syncope, orthopnea, palpitations, paroxysmal nocturnal dyspnea and syncope.  Respiratory:  Negative for shortness of breath.   Hematologic/Lymphatic: Negative for bleeding problem.  Musculoskeletal:  Positive for joint pain. Negative for muscle cramps and myalgias.  Neurological:  Positive for focal weakness. Negative for dizziness and light-headedness.    PHYSICAL EXAMINATION: PHYSICAL EXAM: Temp:  [97.7 F (36.5 C)-98.4 F (36.9 C)] 98.2 F (36.8 C) (03/10 2050) Pulse Rate:  [76-113] 96 (03/10 2200) Resp:  [15-23] 22 (03/10 2200) BP: (125-178)/(63-102) 152/90 (03/10 2200) SpO2:  [91 %-98 %] 92 % (03/10 2200)  Today's Vitals   03/16/22 1353 03/16/22 1616 03/16/22 2050 03/16/22 2200  BP: (!) 178/72 (!) 159/83 (!) 140/68 (!) 152/90  Pulse: (!) 113  92 96   Resp: (!) 23  (!) 22 (!) 22  Temp: 98.2 F (36.8 C) 98.4 F (36.9 C) 98.2 F (36.8 C)   TempSrc: Oral Oral Oral   SpO2: 95%  91% 92%  Weight:      Height:      PainSc:       Body mass index is 21.68 kg/m.  Intake/Output:  Intake/Output Summary (Last 24 hours) at 03/16/2022 2247 Last data filed at 03/16/2022 1204 Gross per 24 hour  Intake 600 ml  Output --  Net 600 ml     Net IO Since Admission: 600 mL [03/16/22 2247]  Weights:     03/15/2022   10:00 PM 01/27/2022   11:11 AM 01/16/2022    3:05 PM  Last 3 Weights  Weight (lbs) 138 lb 7.2 oz 131 lb 131 lb  Weight (kg) 62.8 kg 59.421 kg 59.421 kg     Physical Exam  Constitutional: No distress. She appears chronically ill.  hemodynamically stable.   Neck: No JVD present.  Cardiovascular: S1 normal, S2 normal, intact distal pulses and normal pulses. An irregularly irregular rhythm present. Tachycardia present. Exam reveals no gallop, no S3  and no S4.  No murmurs rubs or gallops appreciated secondary to tachycardia  Pulmonary/Chest: Effort normal and breath sounds normal. No stridor. She has no wheezes. She has no rales.  Abdominal: Soft. Bowel sounds are normal. She exhibits no distension. There is no abdominal tenderness.  Musculoskeletal:        General: No edema.     Cervical back: Neck supple.  Neurological: She is alert.  Moves all 4 extremities against gravity. Strength right hand 2 out of 5, strength left hand 4 out of 5, strength bilateral lower extremities 4 out of 5  Skin: Skin is warm and moist.    LAB RESULTS: Chemistry Recent Labs  Lab 03/15/22 2220 03/15/22 2228 03/16/22 0450  NA 130* 130* 136  K 4.9 4.9 4.1  CL 95* 97* 101  CO2 23  --  25  GLUCOSE 92 92 109*  BUN '17 22 15  '$ CREATININE 0.81 0.90 0.75  CALCIUM 9.3  --  8.8*  PROT 6.1*  --   --   ALBUMIN 3.4*  --   --   AST 21  --   --   ALT 13  --   --   ALKPHOS 55  --   --   BILITOT 0.8  --   --   GFRNONAA >60  --  >60  ANIONGAP 12  --   10    Hematology Recent Labs  Lab 03/15/22 2220 03/15/22 2228 03/16/22 0450  WBC 5.5  --  4.4  RBC 3.78*  --  3.89  HGB 12.6 12.9 12.8  HCT 37.8 38.0 38.2  MCV 100.0  --  98.2  MCH 33.3  --  32.9  MCHC 33.3  --  33.5  RDW 14.1  --  14.3  PLT 210  --  200   High Sensitivity Troponin:  No results for input(s): "TROPONINIHS" in the last 720 hours.   Cardiac EnzymesNo results for input(s): "TROPONINI" in the last 168 hours. No results for input(s): "TROPIPOC" in the last 168 hours.  BNPNo results for input(s): "BNP", "PROBNP" in the last 168 hours.  DDimer No results for input(s): "DDIMER" in the last 168 hours.  Hemoglobin A1c: No results found for: "HGBA1C", "MPG" TSH No results for input(s): "TSH" in the last 8760 hours. Lipid Panel  Lab Results  Component Value Date   CHOL 252 (H) 03/16/2022   HDL 84 03/16/2022   LDLCALC 154 (H) 03/16/2022   TRIG 68 03/16/2022   CHOLHDL 3.0 03/16/2022   Drugs of Abuse  No results found for: "LABOPIA", "COCAINSCRNUR", "LABBENZ", "AMPHETMU", "THCU", "LABBARB"    RADIOLOGY: CT chest with contrast: 08/17/2020 IMPRESSION: 1. Scattered areas of bronchiectasis are noted in the lungs bilaterally with associated chronic scarring/atelectasis in the right middle lobe. Notably, there also 2 large pulmonary nodules in the posterior aspect of the left lower lobe. These are favored to be of infectious or inflammatory etiology, but underlying neoplasm is difficult to entirely exclude. Accordingly, short-term repeat chest CT is recommended 1-2 months to ensure the stability or regression of these findings. 2. Cardiomegaly with biatrial dilatation. Notably, there is a large filling defect in the tip of the left atrial appendage highly concerning for left atrial appendage thrombus. If present, this places the patient at high risk for systemic embolization. Further evaluation with transesophageal echocardiography is recommended if clinically appropriate. 3.  Trace left pleural effusion lying dependently. 4. Aortic atherosclerosis, in addition to left main and 3 vessel coronary artery disease. 5. There  are calcifications of the aortic valve and mitral annulus. Echocardiographic correlation for evaluation of potential valvular dysfunction may be warranted if clinically indicated.   CT chest without contrast 12/21/2020: 1. Pulmonary nodules in the posterior left lower lobe have resolved since 08/17/2020 and compatible with resolved infection or inflammation. However, there are multiple new areas of airspace disease throughout both lungs, most prominent in the upper lobes. Findings are most compatible with infectious or inflammatory changes. In addition, there are scattered areas of bronchiectasis with areas of mucoid impaction. 2. Chronic collapse of the right middle lobe. 3. Cardiomegaly. 4.  Aortic Atherosclerosis (ICD10-I70.0).  MRI of the brain without contrast: 03/15/2022 Acute infarcts in the left posterior frontal cortex, including the precentral gyrus.   Additional smaller subacute infarcts in the more anterior left frontal lobe.  CARDIAC DATABASE: EKG: 12/10/2021: Atrial fibrillation, 85 bpm, right bundle branch block. 03/15/2022: Atrial fibrillation, 74 bpm, right bundle branch block.  Echocardiogram: 03/16/2022:  1. Left ventricular ejection fraction, by estimation, is 55 to 60%. The  left ventricle has normal function. The left ventricle has no regional  wall motion abnormalities. Left ventricular diastolic parameters are  indeterminate.   2. Right ventricular systolic function is normal. The right ventricular  size is mildly enlarged. There is mildly elevated pulmonary artery  systolic pressure.   3. Left atrial size was severely dilated.   4. Right atrial size was severely dilated.   5. The mitral valve is abnormal. Mild mitral valve regurgitation. No  evidence of mitral stenosis.   6. The tricuspid valve is abnormal. Tricuspid  valve regurgitation is  moderate to severe.   7. The aortic valve was not well visualized. There is severe calcification  of the aortic valve. There is severe thickening of the aortic valve.  Aortic valve regurgitation is moderate. No aortic stenosis is present.   8. Agitated saline contrast bubble study was negative, with no evidence  of any interatrial shunt.   Scheduled Meds:  aspirin EC  81 mg Oral Daily   atorvastatin  80 mg Oral Daily   diltiazem  180 mg Oral Daily   enoxaparin (LOVENOX) injection  40 mg Subcutaneous Daily   memantine  10 mg Oral BID   senna-docusate  1 tablet Oral BID   sodium chloride flush  3 mL Intravenous Once    Continuous Infusions:  sodium chloride 50 mL/hr at 03/16/22 0341   cefTRIAXone (ROCEPHIN)  IV Stopped (03/16/22 1204)   diltiazem (CARDIZEM) infusion 5 mg/hr (03/16/22 2147)    PRN Meds: acetaminophen, albuterol, labetalol, melatonin, polyethylene glycol, prochlorperazine  IMPRESSION & RECOMMENDATIONS: FAIZA KURZAWA is a 89 y.o. Caucasian female whose past medical history and cardiovascular risk factors include: Atrial fibrillation, hx of stroke (2017), severe coronary calcification (1809, 94th percentile, CT pulmonary vein protocol June 2023), history of breast cancer, osteoporosis bilateral hip fractures, dementia (per daughter), thrombus of the left atrial appendage (CT scan ), pulmonary nodule, advanced age, post menopausal female.  Impression:  Atrial fibrillation with rapid ventricular rate.  Known history of permanent A-fib Long-term anticoagulation Acute stroke (Left posterior frontal cortex, including the precentral gyrus & smaller subacute infarcts in the more anterior left frontal lobe). History of left atrial appendage filling defect (artifact versus thrombus) Coronary artery calcification. Aortic atherosclerosis. Former smoker  Plan:  Atrial fibrillation with rapid ventricular rate Known history of permanent atrial  fibrillation Long-term oral anticoagulation Rate control: Diltiazem.  Rhythm control: N/A Thromboembolic prophylaxis: Eliquis Continue oral dose of diltiazem and will start  diltiazem drip and uptitrate for ventricular rate less than 110 bpm.  During her last office visit in December 2023 (her age was >80, and weight <60 kg) and therefore she was placed on Eliquis 2.5 mg.  Her weight now is > 60 kg which raises a concern/possibility of subtherapeutic anticoagulation which may have contributed to her current CVA in addition to other CVD risk factors.  Recommend changing Eliquis dose to 5 mg p.o. twice daily (age >80, weight >60 kg, creatinine <1.5 mg/dL).  Patient and family want to proceed with rate control strategy. In the past he refused TEE and continue to feel the same.  CHA2DS2-VASc SCORE is 7 which correlates to 11.2% risk of stroke per year (age, HTN, gender, CVA, Ao atherosclerosis).  Does not endorse evidence of bleeding.   Acute CVA:  (Left posterior frontal cortex, including the precentral gyrus & smaller subacute infarcts in the more anterior left frontal lobe). Secondary prevention as this is her second episode.  In the past family did not want her to be on statin due to advance age - but agree w/ statin for now given her CVA and LDL levels.  Bubble study is negative as per her TTE.  We discussed TEE gain to rule out cardiac etiology but family refuses for now - acceptable given her advance age and comorbid conditions.  Will also discuss with neurology the role of aspirin in addition to systemic anticoagulation to minimize risk of bleeding.  History of left atrial appendage filling defect (artifact versus thrombus): Presumed  Refused TEE in the past.   Pure hyperlipidemia: Indexed LDL 154 mg/dL.  Continue statin therapy.   Will follow with you.   Plan of care discussed w/ patient, daughter, son, and RN after rounding.   Patient's questions and concerns were addressed to her  satisfaction. She voices understanding of the instructions provided during this encounter.   This note was created using a voice recognition software as a result there may be grammatical errors inadvertently enclosed that do not reflect the nature of this encounter. Every attempt is made to correct such errors.  Mechele Claude [facility]  Pager:  [phone] Office: [phone] 03/16/2022, 10:47 PM

## 2022-03-16 NOTE — Evaluation (Signed)
Clinical/Bedside Swallow Evaluation Patient Details  Name: IMBERLY WAAGE MRN: [id number] Date of Birth: 09/16/1921  Today's Date: 03/16/2022 Time: SLP Start Time (ACUTE ONLY): 1031 SLP Stop Time (ACUTE ONLY): [id number] SLP Time Calculation (min) (ACUTE ONLY): 12 min  Past Medical History:  Past Medical History:  Diagnosis Date   Atherosclerosis of aorta ([place])    Atrial fibrillation ([place])    Breast cancer ([place])    Chronic Bronchitis    Coronary atherosclerosis due to calcified coronary lesion    Nocturnal polyuria    Stroke ([place])    Thrombus of left atrial appendage    Past Surgical History:  Past Surgical History:  Procedure Laterality Date   BREAST LUMPECTOMY Left 2001   FEMUR FRACTURE SURGERY Right 2019   RIGHT - 2015   TOTAL ABDOMINAL HYSTERECTOMY  1967   HPI:  GRAYLYNN BASSE is a 89 y.o. female with medical history significant for dementia requiring 24/7 assistance, permanent atrial fibrillation on Eliquis, thrombus of atrial appendage, prior history of stroke with no residual deficits, history of breast cancer status post lumpectomy, osteoporosis and bronchiectasis, who presented to [facility] ED from home as a code stroke with right facial drooling and dysarthria.  Acute infarcts in the left posterior frontal cortex, including the  precentral gyrus.  UA was positive for pyuri    Assessment / Plan / Recommendation  Clinical Impression  Pt alert for swallow assessment with granddaughter at bedside who deny prior dysphagia. Pt's dentition is intact and adequate oromotor abilities. She coordinated swallow and respirations effectively to prevent any s/s aspiration with applesaue or straw sips thin (sequential). She did have one throat clear after solid likely due to talking while masticating. Thorough mastication without residue. It is recommended that she continue regular texture/thin liquids, pills with thin (if has difficulty can place whole in puree). Sit upright and limit distractions. No  further ST needed at this time. SLP Visit Diagnosis: Dysphagia, unspecified (R13.10)    Aspiration Risk  Mild aspiration risk    Diet Recommendation Regular;Thin liquid   Liquid Administration via: Cup;Straw Medication Administration: Whole meds with liquid Supervision: Staff to assist with self feeding;Full supervision/cueing for compensatory strategies Compensations: Slow rate;Small sips/bites;Minimize environmental distractions Postural Changes: Seated upright at 90 degrees    Other  Recommendations Oral Care Recommendations: Oral care BID    Recommendations for follow up therapy are one component of a multi-disciplinary discharge planning process, led by the attending physician.  Recommendations may be updated based on patient status, additional functional criteria and insurance authorization.  Follow up Recommendations No SLP follow up      Assistance Recommended at Discharge    Functional Status Assessment Patient has not had a recent decline in their functional status  Frequency and Duration            Prognosis        Swallow Study   General Date of Onset: 03/15/22 HPI: JERUSALEM INGALSBE is a 89 y.o. female with medical history significant for dementia requiring 24/7 assistance, permanent atrial fibrillation on Eliquis, thrombus of atrial appendage, prior history of stroke with no residual deficits, history of breast cancer status post lumpectomy, osteoporosis and bronchiectasis, who presented to [facility] ED from home as a code stroke with right facial drooling and dysarthria.  Acute infarcts in the left posterior frontal cortex, including the  precentral gyrus.  UA was positive for pyuri Type of Study: Bedside Swallow Evaluation Previous Swallow Assessment:  (none) Diet Prior to this Study:  Regular;Thin liquids (Level 0) Temperature Spikes Noted: No Respiratory Status: Room air History of Recent Intubation: No Behavior/Cognition: Alert;Requires cueing Oral Cavity Assessment:  Within Functional Limits Oral Care Completed by SLP: No Oral Cavity - Dentition: Adequate natural dentition Vision: Functional for self-feeding Self-Feeding Abilities: Needs assist Patient Positioning: Upright in bed Baseline Vocal Quality: Normal Volitional Cough: Strong Volitional Swallow: Able to elicit    Oral/Motor/Sensory Function Overall Oral Motor/Sensory Function: Within functional limits   Ice Chips Ice chips: Not tested   Thin Liquid Thin Liquid: Within functional limits    Nectar Thick Nectar Thick Liquid: Within functional limits   Honey Thick Honey Thick Liquid: Within functional limits   Puree Puree: Within functional limits   Solid     Solid: Impaired Pharyngeal Phase Impairments: Throat Clearing - Immediate      Mick Sell Orbie Pyo 03/16/2022,10:50 AM

## 2022-03-16 NOTE — Code Documentation (Signed)
Stroke Response Nurse Documentation Code Documentation  Bridget [name] is a 89 y.o. female arriving to [facility]  via [facility] on 3/9 with past medical hx of afib,CAD, left atrial thrombus, dementia. On clopidogrel 75 mg daily. Code stroke was activated by EMS.   Patient from SNF where she was LKW at 2130 and now complaining of slurred speech.  Stroke team at the bedside on patient arrival. Labs drawn and patient cleared for CT by Lorre Munroe PA. Patient to CT with team. NIHSS 10, see documentation for details and code stroke times. Patient with disoriented, bilateral leg weakness, and dysarthria  on exam. The following imaging was completed:  CT Head. Patient is not a candidate for IV Thrombolytic due to Eliquis. Patient is not a candidate for IR due to low suspicion for LVO.     Bedside handoff with ED RN Mortimer Fries.    Madelynn Done  Rapid Response RN

## 2022-03-16 NOTE — ED Notes (Signed)
Pt noncompliant with taking medications unless daughter Nai is present. Holding meds at this time

## 2022-03-16 NOTE — H&P (Signed)
History and Physical  Bridget [name] [id number] DOB: [birth year] DOA: 03/15/2022  Referring physician: Montine Circle, PA-EDP PCP: Haywood Pao, MD  Outpatient Specialists: Pulmonology, cardiology. Patient coming from: Home.  Chief Complaint: Code stroke  HPI: Bridget [name] is a 89 y.o. female with medical history significant for dementia requiring 24/7 assistance, permanent atrial fibrillation on Eliquis, thrombus of atrial appendage, prior history of stroke with no residual deficits, history of breast cancer status post lumpectomy, osteoporosis and bronchiectasis, who presented to [facility] ED from home as a code stroke.  The patient was noted to have right facial drooling and dysarthria.  Reportedly onset of symptoms about 4 to 5 hours prior to arrival.  Last known well was 2130.  The patient is anticoagulated on Eliquis.  EMS was activated.  Upon EMS arrival, reported right-sided droop initially.  Associated with slurred speech.    In the ED, seen by neurostroke team.  The patient is accompanied by her daughter and granddaughter who report left abdominal distention despite normal bowel movements.  No reported abdominal pain.  Workup in the ED revealed acute CVA seen on brain MRI.  UA was positive for pyuria.  CT abdomen and pelvis showed constipation.  The patient received Rocephin empirically for UTI and 500 cc NS bolus.  EDP requested admission for stroke workup.  Admitted by [facility], hospitalist service.  ED Course: Tmax 97.7.  BP 1 55-63, pulse 78, respiration rate 21, saturation 96% on room air.  Lab studies remarkable for WBC 5.5, hemoglobin 12.9.  Review of Systems: Review of systems as noted in the HPI. All other systems reviewed and are negative.   Past Medical History:  Diagnosis Date   Atherosclerosis of aorta ([place])    Atrial fibrillation ([place])    Breast cancer ([name])    Chronic Bronchitis    Coronary atherosclerosis due to calcified coronary lesion    Nocturnal polyuria     Stroke ([place])    Thrombus of left atrial appendage    Past Surgical History:  Procedure Laterality Date   BREAST LUMPECTOMY Left 2001   FEMUR FRACTURE SURGERY Right 2019   RIGHT - 2015   TOTAL ABDOMINAL HYSTERECTOMY  1967    Social History:  reports that she quit smoking about 74 years ago. Her smoking use included cigarettes. She has a 10.50 pack-year smoking history. She has never used smokeless tobacco. She reports that she does not drink alcohol and does not use drugs.   Allergies  Allergen Reactions   Pulmicort [Budesonide] Swelling    Eye swelling, ankle and knee swelling   Erythromycin Other (See Comments)    Unknown, but "all the mycins bother me"   Roxicodone [Oxycodone] Other (See Comments)    Confusion    Ultram [Tramadol] Other (See Comments)    Confusion     Family History  Problem Relation Age of Onset   Other Sister        TRAUMA TO HEAD AFTER A FALL      Prior to Admission medications   Medication Sig Start Date End Date Taking? Authorizing Provider  acetaminophen (TYLENOL) 500 MG tablet Take 1,000 mg by mouth daily as needed for mild pain or headache.    [provider]  albuterol (VENTOLIN HFA) 108 (90 Base) MCG/ACT inhaler Inhale 2 puffs into the lungs every 6 (six) hours as needed for shortness of breath or wheezing.    [provider]  apixaban (ELIQUIS) 2.5 MG TABS tablet TAKE 1 TABLET BY  MOUTH TWICE A DAY 02/12/22   Tolia, Sunit, DO  B Complex Vitamins (B COMPLEX PO) Take 1 capsule by mouth daily.    [provider]  benzonatate (TESSALON) 200 MG capsule Take 1 capsule (200 mg total) by mouth 3 (three) times daily as needed. Patient not taking: Reported on 01/27/2022 01/16/22 01/16/23  Parrett, Fonnie Mu, NP  Besifloxacin HCl (BESIVANCE) 0.6 % SUSP Place 1 drop into both eyes See admin instructions. Instill 1 drop into each eye 4 times daily for 2 days after eye injections    [provider]  Budeson-Glycopyrrol-Formoterol  (BREZTRI AEROSPHERE) 160-9-4.8 MCG/ACT AERO Inhale 2 puffs into the lungs in the morning and at bedtime. 11/20/21   Margaretha Seeds, MD  D-[place] Paul Dykes) POWD Take 1 Scoop by mouth daily in the afternoon.    [provider]  D-MANNOSE PO Take 2 tablets by mouth 2 (two) times daily.    [provider]  denosumab (PROLIA) 60 MG/ML SOSY injection Inject 60 mg into the skin See admin instructions. Every 6 months 01/27/18   [provider]  diltiazem (TIAZAC) 180 MG 24 hr capsule Take 180 mg by mouth daily.    [provider]  furosemide (LASIX) 20 MG tablet Take 20 mg by mouth daily as needed for edema.    [provider]  ketoconazole (NIZORAL) 2 % shampoo Apply 1 Application topically See admin instructions. Apply and lather on skin and let sit for a few minutes prior to rinsing when you shower    [provider]  memantine (NAMENDA) 10 MG tablet Take 1 tablet by mouth 2 (two) times daily.    [provider]  Multiple Vitamins-Minerals (PRESERVISION AREDS 2) CAPS Take 1 capsule by mouth in the morning and at bedtime.    [provider]  Omega-3 Fatty Acids (FISH OIL PO) Take 15 mLs by mouth at bedtime.    [provider]  OVER THE COUNTER MEDICATION Take 1 each by mouth in the morning. CBD gummy    [provider]  OVER THE COUNTER MEDICATION Take 1 each by mouth at bedtime. CBD + melatonin gummy    [provider]  OVER THE COUNTER MEDICATION Take 1 Scoop by mouth daily. Kalmegh powder supplement    [provider]  Resveratrol 250 MG CAPS Take 1 capsule by mouth 2 (two) times daily.    [provider]  senna (SENOKOT) 8.6 MG tablet Take 1 tablet by mouth daily as needed for constipation.    [provider]  Turmeric (QC TUMERIC COMPLEX PO) Take by mouth. Unsure of dosage    [provider]    Physical Exam: BP (!) 157/64   Pulse 76   Temp 97.7 F (36.5 C)  (Oral)   Resp 16   Ht '5\' 7"'$  (1.702 m)   Wt 62.8 kg   SpO2 96%   BMI 21.68 kg/m   General: 89 y.o. year-old female well developed well nourished in no acute distress.  Alert and confused in the setting of dementia. Cardiovascular: Irregular rate and rhythm with no rubs or gallops.  No thyromegaly or JVD noted.  No lower extremity edema. 2/4 pulses in all 4 extremities. Respiratory: Clear to auscultation with no wheezes or rales. Poor inspiratory effort. Abdomen: Soft nontender nondistended with normal bowel sounds x4 quadrants. Muskuloskeletal: No cyanosis, clubbing or edema noted bilaterally Neuro: CN II-XII intact, strength, sensation, reflexes Skin: No ulcerative lesions noted or rashes Psychiatry: Judgement and insight  appear altered. Mood is appropriate for condition and setting          Labs on Admission:  Basic Metabolic Panel: Recent Labs  Lab 03/15/22 2220 03/15/22 2228  NA 130* 130*  K 4.9 4.9  CL 95* 97*  CO2 23  --   GLUCOSE 92 92  BUN 17 22  CREATININE 0.81 0.90  CALCIUM 9.3  --    Liver Function Tests: Recent Labs  Lab 03/15/22 2220  AST 21  ALT 13  ALKPHOS 55  BILITOT 0.8  PROT 6.1*  ALBUMIN 3.4*   No results for input(s): "LIPASE", "AMYLASE" in the last 168 hours. No results for input(s): "AMMONIA" in the last 168 hours. CBC: Recent Labs  Lab 03/15/22 2220 03/15/22 2228  WBC 5.5  --   NEUTROABS 2.9  --   HGB 12.6 12.9  HCT 37.8 38.0  MCV 100.0  --   PLT 210  --    Cardiac Enzymes: No results for input(s): "CKTOTAL", "CKMB", "CKMBINDEX", "TROPONINI" in the last 168 hours.  BNP (last 3 results) No results for input(s): "BNP" in the last 8760 hours.  ProBNP (last 3 results) No results for input(s): "PROBNP" in the last 8760 hours.  CBG: Recent Labs  Lab 03/15/22 2216  GLUCAP 109*    Radiological Exams on Admission: MR BRAIN WO CONTRAST  Result Date: 03/16/2022 CLINICAL DATA:  Neuro deficit, stroke suspected EXAM: MRI HEAD  WITHOUT CONTRAST TECHNIQUE: Multiplanar, multiecho pulse sequences of the brain and surrounding structures were obtained without intravenous contrast. COMPARISON:  No prior MRI available, correlation is made with CT head 03/15/2022 FINDINGS: Brain: Restricted diffusion with ADC correlate in the left posterior frontal cortex, including the precentral gyrus (series 5, images 82-87), consistent with acute infarcts. This areas not associated with significantly increased T2 hyperintense signal. Additional smaller foci of mildly hyperintense signal on diffusion-weighted imaging without definite ADC correlate in the more anterior left frontal lobe may represent more subacute infarcts (series 5, images 82-83). No acute hemorrhage, mass, mass effect, or midline shift. No hydrocephalus or extra-axial collection. Encephalomalacia in the right greater than left frontal lobes, consistent with remote infarcts. Age related cerebral atrophy, with slightly more pronounced atrophy in the bilateral medial temporal lobes. T2 hyperintense signal in the periventricular white matter, likely the sequela of moderate chronic small vessel ischemic disease. Vascular: Normal arterial flow voids. Skull and upper cervical spine: Normal marrow signal. Sinuses/Orbits: Clear paranasal sinuses. Status post bilateral lens replacements. Other: Fluid in the mastoid air cells. IMPRESSION: Acute infarcts in the left posterior frontal cortex, including the precentral gyrus. Additional smaller subacute infarcts in the more anterior left frontal lobe. Imaging results were communicated on 03/16/2022 at 12:43 am to provider Dr. Rory Percy via secure text paging. Electronically Signed   By: Merilyn Baba M.D.   On: 03/16/2022 00:47   DG Chest Port 1 View  Result Date: 03/15/2022 CLINICAL DATA:  Screening EXAM: PORTABLE CHEST 1 VIEW COMPARISON:  Radiographs 01/16/2022 FINDINGS: No change from 01/16/2022. Airspace opacity in the right lower lung may represent  chronic middle lobe collapse as there is slight rightward deviation of the mediastinum. Cardiomegaly. Aortic atherosclerotic calcification. Chronic bronchitic change. No displaced rib fractures. IMPRESSION: No change from 01/16/2022. Airspace opacity in the right lower lung may represent chronic middle lobe collapse as there is slight rightward deviation of the mediastinum. Electronically Signed   By: Placido Sou M.D.   On: 03/15/2022 23:54   CT ANGIO HEAD NECK W WO CM (CODE  STROKE)  Result Date: 03/15/2022 CLINICAL DATA:  Stroke suspected EXAM: CT ANGIOGRAPHY HEAD AND NECK TECHNIQUE: Multidetector CT imaging of the head and neck was performed using the standard protocol during bolus administration of intravenous contrast. Multiplanar CT image reconstructions and MIPs were obtained to evaluate the vascular anatomy. Carotid stenosis measurements (when applicable) are obtained utilizing NASCET criteria, using the distal internal carotid diameter as the denominator. RADIATION DOSE REDUCTION: This exam was performed according to the departmental dose-optimization program which includes automated exposure control, adjustment of the mA and/or kV according to patient size and/or use of iterative reconstruction technique. CONTRAST:  54m OMNIPAQUE IOHEXOL 350 MG/ML SOLN COMPARISON:  No prior CTA available, correlation is made with CT head 03/15/2022 FINDINGS: CT HEAD FINDINGS For noncontrast findings, please see same day CT head. CTA NECK FINDINGS Aortic arch: Incompletely imaged, but standard aortic branching is suspected. Imaged portion shows no evidence of aneurysm or dissection. No significant stenosis of the major arch vessel origins. Right carotid system: No evidence of dissection, occlusion, or hemodynamically significant stenosis (greater than 50%). Atherosclerotic disease at the bifurcation and in the proximal ICA is not hemodynamically significant. Tortuous distal ICAs. Left carotid system: No evidence of  dissection, occlusion, or hemodynamically significant stenosis (greater than 50%). Atherosclerotic disease at the bifurcation and in the proximal ICA is not hemodynamically significant. Vertebral arteries: No evidence of dissection, occlusion, or hemodynamically significant stenosis (greater than 50%). The left vertebral artery is diminutive throughout its course. Skeleton: No acute osseous abnormality. Severe degenerative changes in the cervical spine, with reversal of the normal cervical lordosis. Other neck: Negative. Upper chest: No focal pulmonary opacity or pleural effusion. Review of the MIP images confirms the above findings CTA HEAD FINDINGS Anterior circulation: Both internal carotid arteries are patent to the termini, with mild stenosis in the bilateral supraclinoid segments. Patent right A1. Severely hypoplastic left A1. Normal anterior communicating artery. Severe stenosis in the left A2 segment (series 7, images 70-72). Anterior cerebral arteries are otherwise patent to their distal aspects. No M1 stenosis or occlusion. MCA branches perfused and symmetric. Posterior circulation: The left vertebral artery terminates in PICA. The right vertebral artery is patent to the vertebrobasilar junction without stenosis. Posterior inferior cerebellar arteries patent proximally. Basilar patent to its distal aspect. Superior cerebellar arteries patent proximally. Patent P1 segments. PCAs perfused to their distal aspects without stenosis. The bilateral posterior communicating arteries are not visualized. Venous sinuses: As permitted by contrast timing, patent. Anatomic variants: None significant. Review of the MIP images confirms the above findings IMPRESSION: 1. No intracranial large vessel occlusion. Severe stenosis in the left A2 segment. 2. No hemodynamically significant stenosis in the neck. Imaging results were communicated on 03/15/2022 at 11:26 pm to provider Dr. ARory Percyvia secure text paging. Electronically  Signed   By: AMerilyn BabaM.D.   On: 03/15/2022 23:27   CT HEAD CODE STROKE WO CONTRAST  Result Date: 03/15/2022 CLINICAL DATA:  Code stroke. EXAM: CT HEAD WITHOUT CONTRAST TECHNIQUE: Contiguous axial images were obtained from the base of the skull through the vertex without intravenous contrast. RADIATION DOSE REDUCTION: This exam was performed according to the departmental dose-optimization program which includes automated exposure control, adjustment of the mA and/or kV according to patient size and/or use of iterative reconstruction technique. COMPARISON:  03/22/2020 FINDINGS: Brain: No evidence of acute infarction, hemorrhage, mass, mass effect, or midline shift. No hydrocephalus or extra-axial collection. Redemonstrated right greater than left frontal cortical infarcts. Periventricular white matter changes, likely the sequela  of chronic small vessel ischemic disease. Vascular: No hyperdense vessel. Atherosclerotic calcifications in the intracranial carotid and vertebral arteries. Skull: Negative for fracture or focal lesion. Sinuses/Orbits: No acute finding. Status post bilateral lens replacements. Other: The mastoid air cells are well aerated. ASPECTS [facility] Stroke Program Early CT Score) - Ganglionic level infarction (caudate, lentiform nuclei, internal capsule, insula, M1-M3 cortex): 7 - Supraganglionic infarction (M4-M6 cortex): 3 Total score (0-10 with 10 being normal): 10 IMPRESSION: 1. No acute intracranial process. 2. ASPECTS is 10. Imaging results were communicated on 03/15/2022 at 10:30 pm to provider Dr. Rory Percy via secure text paging. Electronically Signed   By: Merilyn Baba M.D.   On: 03/15/2022 22:30    EKG: I independently viewed the EKG done and my findings are as followed: Atrial fibrillation rate of 74.  Nonspecific ST-T changes.  QTc 456.  Assessment/Plan Present on Admission:  CVA (cerebral vascular accident) ([place])  Principal Problem:   CVA (cerebral vascular accident)  ([place])  Acute CVA involving left posterior frontal cortex, precentral gyrus, additional smaller subacute infarcts in the more anterior left frontal lobe Prior to admission on Eliquis for permanent atrial fibrillation and intracardiac thrombus CT angio head and neck showed no intracranial large vessel occlusion.  Severe stenosis in the left A2 segment.  No hemodynamically significant stenosis in the neck. Admitted for stroke workup Permissive hypertension for the first 24 to 48 hours, treat SBP greater than 220 or DBP greater than 120. Frequent neurochecks 2D echo with bubble study Fasting lipid panel, A1c Speech therapy/PT/OT Lipitor 80 mg daily Hold home Eliquis, aspirin 81 mg for now, per neurology. Consult cardiology, Dr. Terri Skains, or covering cardiologist from [facility] cardiology for recommendation on anticoagulation. Rest of management per neurology.  UTI, POA Obtain urine culture Continue Rocephin started in the ED. Narrow down antibiotics according to ID and sensitivities.  Hypovolemic hyponatremia Serum sodium 130 on presentation Gentle IV fluid hydration NS at 50 cc/h x 2 days. Repeat BMP in the morning.  Permanent atrial fibrillation on Eliquis Hold off home Eliquis as recommended by neurology. Consult cardiology in the morning for anticoagulation recommendations. Currently rate controlled Home diltiazem on hold to allow for permissive hypertension. Monitor on telemetry  Dementia Resume home Namenda Reorient as needed  Chronic constipation Start bowel regimen.   Critical care time: 65 minutes.   DVT prophylaxis: Home Eliquis on hold, subcu Lovenox daily.  Code Status: DNR  Family Communication: Updated the patient's daughter and granddaughter at bedside.  Disposition Plan: Admitted to telemetry medical unit  Consults called: Neurology/stroke team.  Admission status: Inpatient status.   Status is: Inpatient The patient requires at least 2 midnights for  further evaluation and treatment of present condition   Kayleen Memos MD [facility] Pager [phone]  If 7PM-7AM, please contact night-coverage [email or web address] Password [email or web address]  03/16/2022, 1:17 AM

## 2022-03-16 NOTE — Evaluation (Signed)
Physical Therapy Evaluation Patient Details Name: Bridget [name] MRN: [id number] DOB: [birth year] Today's Date: 03/16/2022  History of Present Illness  Bridget [name] is a 89 y.o. female who presents 03/15/2022 who presented to [facility] ED from home as a code stroke with right facial drooling and dysarthria. Acute infarcts in the left posterior frontal cortex, including the  precentral gyrus.  UA was positive for pyuri. Medical history significant for dementia requiring 24/7 assistance, permanent atrial fibrillation on Eliquis, thrombus of atrial appendage, prior history of stroke with no residual deficits, history of breast cancer status post lumpectomy, osteoporosis and bronchiectasis.  Clinical Impression  PTA, pt lives with her family, uses a Radiation protection practitioner for household ambulation and requires supervision/assist for ADL's. Pt presents with right hemiplegia, impaired sitting/standing balance, and decreased coordination. Pt requiring up to two person maximal assist for standing from edge of bed x 2 with bilateral knee block. Emphasis on midline positioning with upright posture. Pt with difficulty weight shifting to take steps. Suspect steady progress given PLOF and motivation. Would benefit from AIR to address deficits, maximize functional mobility and decrease caregiver burden.     Recommendations for follow up therapy are one component of a multi-disciplinary discharge planning process, led by the attending physician.  Recommendations may be updated based on patient status, additional functional criteria and insurance authorization.  Follow Up Recommendations Acute inpatient rehab (3hours/day)      Assistance Recommended at Discharge Frequent or constant Supervision/Assistance  Patient can return home with the following  Two people to help with walking and/or transfers;A lot of help with bathing/dressing/bathroom    Equipment Recommendations Other (comment) (TBA)  Recommendations for Other Services  Rehab  consult    Functional Status Assessment Patient has had a recent decline in their functional status and demonstrates the ability to make significant improvements in function in a reasonable and predictable amount of time.     Precautions / Restrictions Precautions Precautions: Fall Restrictions Weight Bearing Restrictions: No      Mobility  Bed Mobility Overal bed mobility: Needs Assistance Bed Mobility: Supine to Sit, Sit to Supine     Supine to sit: Max assist Sit to supine: Max assist, +2 for safety/equipment   General bed mobility comments: Assist for trunk and BLE's from supine <> sit on stretcher, exiting towards right side. Increased time    Transfers Overall transfer level: Needs assistance Equipment used: None Transfers: Sit to/from Stand Sit to Stand: Max assist, +2 physical assistance           General transfer comment: MaxA + 2 for sit to stand x 2 from stretcher. Pt with increased bilateral knee flexion, crouched posture. able to correct minimally. Difficulty with weight shifting    Ambulation/Gait                  Stairs            Wheelchair Mobility    Modified Rankin (Stroke Patients Only) Modified Rankin (Stroke Patients Only) Pre-Morbid Rankin Score: Moderately severe disability Modified Rankin: Severe disability     Balance Overall balance assessment: Needs assistance Sitting-balance support: Feet unsupported Sitting balance-Leahy Scale: Poor Sitting balance - Comments: Requiring minA due to right lateral lean   Standing balance support: Bilateral upper extremity supported Standing balance-Leahy Scale: Poor Standing balance comment: reliant on external support  Pertinent Vitals/Pain Pain Assessment Pain Assessment: No/denies pain    Home Living Family/patient expects to be discharged to:: Private residence Living Arrangements: Children Available Help at Discharge:  Family;Available 24 hours/day (daughter, great granddaughter) Type of Home: House Home Access: Stairs to enter Entrance Stairs-Rails: Left Entrance Stairs-Number of Steps: 3   Home Layout: One level Home Equipment: Rollator (4 wheels);Cane - single point;Grab bars - tub/shower;Grab bars - toilet;Toilet riser;Tub bench      Prior Function Prior Level of Function : Needs assist             Mobility Comments: Pt uses rollator at baseline for limited household ambulation and SPC + railing for stairs with guarding from daughter ADLs Comments: assist/supervision for safety at all times with ADL's     Hand Dominance   Dominant Hand: Right    Extremity/Trunk Assessment   Upper Extremity Assessment Upper Extremity Assessment: Defer to OT evaluation    Lower Extremity Assessment Lower Extremity Assessment: RLE deficits/detail RLE Deficits / Details: Grossly 2/5    Cervical / Trunk Assessment Cervical / Trunk Assessment: Kyphotic  Communication   Communication: HOH  Cognition Arousal/Alertness: Awake/alert Behavior During Therapy: WFL for tasks assessed/performed Overall Cognitive Status: History of cognitive impairments - at baseline                                 General Comments: Pt following 1 step commands with increased time, joking with therapist, decreased awareness of deficits        General Comments      Exercises     Assessment/Plan    PT Assessment Patient needs continued PT services  PT Problem List Decreased strength;Decreased activity tolerance;Decreased balance;Decreased mobility;Decreased cognition;Decreased safety awareness       PT Treatment Interventions DME instruction;Gait training;Functional mobility training;Therapeutic activities;Therapeutic exercise;Balance training;Patient/family education    PT Goals (Current goals can be found in the Care Plan section)  Acute Rehab PT Goals Patient Stated Goal: did not state PT Goal  Formulation: With patient Time For Goal Achievement: 03/30/22 Potential to Achieve Goals: Good    Frequency Min 4X/week     Co-evaluation PT/OT/SLP Co-Evaluation/Treatment: Yes Reason for Co-Treatment: For patient/therapist safety;To address functional/ADL transfers;Complexity of the patient's impairments (multi-system involvement) PT goals addressed during session: Mobility/safety with mobility;Balance         AM-PAC PT "6 Clicks" Mobility  Outcome Measure Help needed turning from your back to your side while in a flat bed without using bedrails?: A Lot Help needed moving from lying on your back to sitting on the side of a flat bed without using bedrails?: A Lot Help needed moving to and from a bed to a chair (including a wheelchair)?: Total Help needed standing up from a chair using your arms (e.g., wheelchair or bedside chair)?: Total Help needed to walk in hospital room?: Total Help needed climbing 3-5 steps with a railing? : Total 6 Click Score: 8    End of Session Equipment Utilized During Treatment: Gait belt Activity Tolerance: Patient tolerated treatment well Patient left: in bed;with call bell/phone within reach;with family/visitor present Nurse Communication: Mobility status PT Visit Diagnosis: Unsteadiness on feet (R26.81);Hemiplegia and hemiparesis Hemiplegia - Right/Left: Right Hemiplegia - dominant/non-dominant: Dominant Hemiplegia - caused by: Cerebral infarction    Time: [id number] PT Time Calculation (min) (ACUTE ONLY): 17 min   Charges:   PT Evaluation $PT Eval Moderate Complexity: 1 Mod  Wyona Almas, PT, DPT Acute Rehabilitation Services Office [place] 03/16/2022, 3:41 PM

## 2022-03-16 NOTE — ED Notes (Signed)
ED TO INPATIENT HANDOFF REPORT  ED Nurse Name and Phone #: Vikki Ports [phone]  S Name/Age/Gender Bridget [name] 89 y.o. female Room/Bed: 001C/001C  Code Status   Code Status: DNR  Home/SNF/Other Home Patient oriented to: self Is this baseline? Yes   Triage Complete: Triage complete  Chief Complaint CVA (cerebral vascular accident) [facility]) [I63.9]  Triage Note Patient arrived with EMS from home as a code stroke , LSN 2130 this evening , family reported slurred speech and right facial droop . Evaluated by EDP and neurologist at arrival and transported to CT scan .   Allergies Allergies  Allergen Reactions   Pulmicort [Budesonide] Swelling    Eye swelling, ankle and knee swelling   Erythromycin Other (See Comments)    Unknown, but "all the mycins bother me"   Roxicodone [Oxycodone] Other (See Comments)    Confusion    Ultram [Tramadol] Other (See Comments)    Confusion     Level of Care/Admitting Diagnosis ED Disposition     ED Disposition  Admit   Condition  --   [place]: [place] [[id number]]  Level of Care: Telemetry Medical [104]  May admit patient to [facility] or [facility] if equivalent level of care is available:: No  Covid Evaluation: Asymptomatic - no recent exposure (last 10 days) testing not required  Diagnosis: CVA (cerebral vascular accident) [facility]) [id number]  Admitting Physician: Kayleen Memos [id number]  Attending Physician: Kayleen Memos [id number]  Certification:: I certify this patient will need inpatient services for at least 2 midnights  Estimated Length of Stay: 2          B Medical/Surgery History Past Medical History:  Diagnosis Date   Atherosclerosis of aorta ([place])    Atrial fibrillation ([place])    Breast cancer ([place])    Chronic Bronchitis    Coronary atherosclerosis due to calcified coronary lesion    Nocturnal polyuria    Stroke ([place])    Thrombus of left atrial appendage    Past Surgical History:   Procedure Laterality Date   BREAST LUMPECTOMY Left 2001   FEMUR FRACTURE SURGERY Right 2019   RIGHT - 2015   TOTAL ABDOMINAL HYSTERECTOMY  1967     A IV Location/Drains/Wounds Patient Lines/Drains/Airways Status     Active Line/Drains/Airways     Name Placement date Placement time Site Days   Peripheral IV 03/15/22 18 G Right Antecubital 03/15/22  --  Antecubital  1            Intake/Output Last 24 hours  Intake/Output Summary (Last 24 hours) at 03/16/2022 1519 Last data filed at 03/16/2022 1204 Gross per 24 hour  Intake 600 ml  Output --  Net 600 ml    Labs/Imaging Results for orders placed or performed during the hospital encounter of 03/15/22 (from the past 48 hour(s))  CBG monitoring, ED     Status: Abnormal   Collection Time: 03/15/22 10:16 PM  Result Value Ref Range   Glucose-Capillary 109 (H) 70 - 99 mg/dL    Comment: Glucose reference range applies only to samples taken after fasting for at least 8 hours.  Protime-INR     Status: None   Collection Time: 03/15/22 10:20 PM  Result Value Ref Range   Prothrombin Time 14.3 11.4 - 15.2 seconds   INR 1.1 0.8 - 1.2    Comment: (NOTE) INR goal varies based on device and disease states. Performed at [facility], [place] [address],  [place], [place] [address]   APTT     Status: None   Collection Time: 03/15/22 10:20 PM  Result Value Ref Range   aPTT 27 24 - 36 seconds    Comment: Performed at [facility] [address], [place], [place] [address]  CBC     Status: Abnormal   Collection Time: 03/15/22 10:20 PM  Result Value Ref Range   WBC 5.5 4.0 - 10.5 K/uL   RBC 3.78 (L) 3.87 - 5.11 MIL/uL   Hemoglobin 12.6 12.0 - 15.0 g/dL   HCT 37.8 36.0 - 46.0 %   MCV 100.0 80.0 - 100.0 fL   MCH 33.3 26.0 - 34.0 pg   MCHC 33.3 30.0 - 36.0 g/dL   RDW 14.1 11.5 - 15.5 %   Platelets 210 150 - 400 K/uL   nRBC 0.0 0.0 - 0.2 %    Comment: Performed at [facility], [place] [employer] [address], [place], [place]  [address]  Differential     Status: None   Collection Time: 03/15/22 10:20 PM  Result Value Ref Range   Neutrophils Relative % 52 %   Neutro Abs 2.9 1.7 - 7.7 K/uL   Lymphocytes Relative 35 %   Lymphs Abs 1.9 0.7 - 4.0 K/uL   Monocytes Relative 10 %   Monocytes Absolute 0.6 0.1 - 1.0 K/uL   Eosinophils Relative 2 %   Eosinophils Absolute 0.1 0.0 - 0.5 K/uL   Basophils Relative 1 %   Basophils Absolute 0.0 0.0 - 0.1 K/uL   Immature Granulocytes 0 %   Abs Immature Granulocytes 0.02 0.00 - 0.07 K/uL    Comment: Performed at [facility] [address], [place], [place] [address]  Comprehensive metabolic panel     Status: Abnormal   Collection Time: 03/15/22 10:20 PM  Result Value Ref Range   Sodium 130 (L) 135 - 145 mmol/L   Potassium 4.9 3.5 - 5.1 mmol/L   Chloride 95 (L) 98 - 111 mmol/L   CO2 23 22 - 32 mmol/L   Glucose, Bld 92 70 - 99 mg/dL    Comment: Glucose reference range applies only to samples taken after fasting for at least 8 hours.   BUN 17 8 - 23 mg/dL   Creatinine, Ser 0.81 0.44 - 1.00 mg/dL   Calcium 9.3 8.9 - 10.3 mg/dL   Total Protein 6.1 (L) 6.5 - 8.1 g/dL   Albumin 3.4 (L) 3.5 - 5.0 g/dL   AST 21 15 - 41 U/L   ALT 13 0 - 44 U/L   Alkaline Phosphatase 55 38 - 126 U/L   Total Bilirubin 0.8 0.3 - 1.2 mg/dL   GFR, Estimated >60 >60 mL/min    Comment: (NOTE) Calculated using the CKD-EPI Creatinine Equation (2021)    Anion gap 12 5 - 15    Comment: Performed at [facility], [place] [address], [place], [place] [address]  Ethanol     Status: None   Collection Time: 03/15/22 10:20 PM  Result Value Ref Range   Alcohol, Ethyl (B) <10 <10 mg/dL    Comment: (NOTE) Lowest detectable limit for serum alcohol is 10 mg/dL.  For medical purposes only. Performed at [facility], [place] [address], [place], [place] [address]   I-stat chem 8, ED     Status: Abnormal   Collection Time: 03/15/22 10:28 PM  Result Value Ref Range   Sodium 130 (L) 135 - 145  mmol/L  Potassium 4.9 3.5 - 5.1 mmol/L   Chloride 97 (L) 98 - 111 mmol/L   BUN 22 8 - 23 mg/dL   Creatinine, Ser 0.90 0.44 - 1.00 mg/dL   Glucose, Bld 92 70 - 99 mg/dL    Comment: Glucose reference range applies only to samples taken after fasting for at least 8 hours.   Calcium, Ion 1.05 (L) 1.15 - 1.40 mmol/L   TCO2 28 22 - 32 mmol/L   Hemoglobin 12.9 12.0 - 15.0 g/dL   HCT 38.0 36.0 - 46.0 %  Urinalysis, Routine w reflex microscopic -Urine, Clean Catch     Status: Abnormal   Collection Time: 03/15/22 11:00 PM  Result Value Ref Range   Color, Urine YELLOW YELLOW   APPearance CLOUDY (A) CLEAR   Specific Gravity, Urine 1.021 1.005 - 1.030   pH 6.0 5.0 - 8.0   Glucose, UA NEGATIVE NEGATIVE mg/dL   Hgb urine dipstick NEGATIVE NEGATIVE   Bilirubin Urine NEGATIVE NEGATIVE   Ketones, ur NEGATIVE NEGATIVE mg/dL   Protein, ur NEGATIVE NEGATIVE mg/dL   Nitrite POSITIVE (A) NEGATIVE   Leukocytes,Ua LARGE (A) NEGATIVE   RBC / HPF 6-10 0 - 5 RBC/hpf   WBC, UA >50 0 - 5 WBC/hpf   Bacteria, UA RARE (A) NONE SEEN   Squamous Epithelial / HPF 0-5 0 - 5 /HPF   Mucus PRESENT     Comment: Performed at [facility], [address] [place],  [address]  Lipid panel     Status: Abnormal   Collection Time: 03/16/22  1:44 AM  Result Value Ref Range   Cholesterol 252 (H) 0 - 200 mg/dL   Triglycerides 68 <150 mg/dL   HDL 84 >40 mg/dL   Total CHOL/HDL Ratio 3.0 RATIO   VLDL 14 0 - 40 mg/dL   LDL Cholesterol 154 (H) 0 - 99 mg/dL    Comment:        Total Cholesterol/HDL:CHD Risk Coronary Heart Disease Risk Table                     Men   Women  1/2 Average Risk   3.4   3.3  Average Risk       5.0   4.4  2 X Average Risk   9.6   7.1  3 X Average Risk  23.4   11.0        Use the calculated Patient Ratio above and the CHD Risk Table to determine the patient's CHD Risk.        ATP III CLASSIFICATION (LDL):  <100     mg/dL   Optimal  100-129  mg/dL   Near or Above                     Optimal  130-159  mg/dL   Borderline  160-189  mg/dL   High  >190     mg/dL   Very High Performed at [facility] [address], [place] [address]   CBC     Status: None   Collection Time: 03/16/22  4:50 AM  Result Value Ref Range   WBC 4.4 4.0 - 10.5 K/uL   RBC 3.89 3.87 - 5.11 MIL/uL   Hemoglobin 12.8 12.0 - 15.0 g/dL   HCT 38.2 36.0 - 46.0 %   MCV 98.2 80.0 - 100.0 fL   MCH 32.9 26.0 - 34.0 pg   MCHC 33.5 30.0 - 36.0  g/dL   RDW 14.3 11.5 - 15.5 %   Platelets 200 150 - 400 K/uL   nRBC 0.0 0.0 - 0.2 %    Comment: Performed at [facility], [place] [address], [place], [place] [id number]  Basic metabolic panel     Status: Abnormal   Collection Time: 03/16/22  4:50 AM  Result Value Ref Range   Sodium 136 135 - 145 mmol/L   Potassium 4.1 3.5 - 5.1 mmol/L   Chloride 101 98 - 111 mmol/L   CO2 25 22 - 32 mmol/L   Glucose, Bld 109 (H) 70 - 99 mg/dL    Comment: Glucose reference range applies only to samples taken after fasting for at least 8 hours.   BUN 15 8 - 23 mg/dL   Creatinine, Ser 0.75 0.44 - 1.00 mg/dL   Calcium 8.8 (L) 8.9 - 10.3 mg/dL   GFR, Estimated >60 >60 mL/min    Comment: (NOTE) Calculated using the CKD-EPI Creatinine Equation (2021)    Anion gap 10 5 - 15    Comment: Performed at [facility] [address], [place], [place] [address]  Magnesium     Status: None   Collection Time: 03/16/22  4:50 AM  Result Value Ref Range   Magnesium 2.2 1.7 - 2.4 mg/dL    Comment: Performed at [facility] [address], [place], [place] [address]  Phosphorus     Status: None   Collection Time: 03/16/22  4:50 AM  Result Value Ref Range   Phosphorus 3.7 2.5 - 4.6 mg/dL    Comment: Performed at [facility] [address], [place], [place] [address]   CT ABDOMEN PELVIS W CONTRAST  Result Date: 03/16/2022 CLINICAL DATA:  Abdominal pain. EXAM: CT ABDOMEN AND PELVIS WITH CONTRAST TECHNIQUE: Multidetector CT imaging of the abdomen and  pelvis was performed using the standard protocol following bolus administration of intravenous contrast. RADIATION DOSE REDUCTION: This exam was performed according to the departmental dose-optimization program which includes automated exposure control, adjustment of the mA and/or kV according to patient size and/or use of iterative reconstruction technique. CONTRAST:  79m OMNIPAQUE IOHEXOL 350 MG/ML SOLN COMPARISON:  None Available. FINDINGS: Evaluation of this exam is limited due to respiratory motion as well as loose stool streak artifact caused by patient's arms. Lower chest: There are bibasilar interstitial coarsening. There is moderate cardiomegaly. Three vessel coronary vascular calcification. No intra-abdominal free air for free fluid. Hepatobiliary: Several small liver cysts. The liver is otherwise unremarkable. No biliary ductal dilatation. The gallbladder is unremarkable. Pancreas: The pancreas is unremarkable as visualized. Spleen: Normal in size without focal abnormality. Adrenals/Urinary Tract: The adrenal glands unremarkable. There is no hydronephrosis on either side. There is symmetric enhancement and excretion of contrast by both kidneys. The visualized ureters and urinary bladder appear unremarkable. Stomach/Bowel: There is moderate stool throughout the colon. There are scattered sigmoid diverticula without active inflammatory changes. There is no bowel obstruction or active inflammation. The appendix is not visualized with certainty. No inflammatory changes identified in the right lower quadrant. Vascular/Lymphatic: The aorta is tortuous. There is advanced aortoiliac atherosclerotic disease. An infrarenal IVC filter is noted. No portal venous gas. There is no adenopathy. Reproductive: The uterus is not visualized, likely surgically absent. There is a 3.2 cm right ovarian cyst. Soft tissue associated with the cyst likely represent the ovarian tissue. This can be better evaluated with ultrasound on  a nonemergent/outpatient basis. The left ovary is not  identified with certainty. Other: None Musculoskeletal: Osteopenia with degenerative changes of the spine. Bilateral femoral neck ORIF. No acute osseous pathology. IMPRESSION: 1. No acute intra-abdominal or pelvic pathology. 2. Constipation.  No bowel obstruction. 3. Sigmoid diverticulosis. 4. A 3.2 cm right ovarian cyst. This can be better evaluated with ultrasound on a nonemergent/outpatient basis. 5.  Aortic Atherosclerosis (ICD10-I70.0). Electronically Signed   By: Anner Crete M.D.   On: 03/16/2022 01:17   MR BRAIN WO CONTRAST  Result Date: 03/16/2022 CLINICAL DATA:  Neuro deficit, stroke suspected EXAM: MRI HEAD WITHOUT CONTRAST TECHNIQUE: Multiplanar, multiecho pulse sequences of the brain and surrounding structures were obtained without intravenous contrast. COMPARISON:  No prior MRI available, correlation is made with CT head 03/15/2022 FINDINGS: Brain: Restricted diffusion with ADC correlate in the left posterior frontal cortex, including the precentral gyrus (series 5, images 82-87), consistent with acute infarcts. This areas not associated with significantly increased T2 hyperintense signal. Additional smaller foci of mildly hyperintense signal on diffusion-weighted imaging without definite ADC correlate in the more anterior left frontal lobe may represent more subacute infarcts (series 5, images 82-83). No acute hemorrhage, mass, mass effect, or midline shift. No hydrocephalus or extra-axial collection. Encephalomalacia in the right greater than left frontal lobes, consistent with remote infarcts. Age related cerebral atrophy, with slightly more pronounced atrophy in the bilateral medial temporal lobes. T2 hyperintense signal in the periventricular white matter, likely the sequela of moderate chronic small vessel ischemic disease. Vascular: Normal arterial flow voids. Skull and upper cervical spine: Normal marrow signal. Sinuses/Orbits: Clear  paranasal sinuses. Status post bilateral lens replacements. Other: Fluid in the mastoid air cells. IMPRESSION: Acute infarcts in the left posterior frontal cortex, including the precentral gyrus. Additional smaller subacute infarcts in the more anterior left frontal lobe. Imaging results were communicated on 03/16/2022 at 12:43 am to provider Dr. Rory Percy via secure text paging. Electronically Signed   By: Merilyn Baba M.D.   On: 03/16/2022 00:47   DG Chest Port 1 View  Result Date: 03/15/2022 CLINICAL DATA:  Screening EXAM: PORTABLE CHEST 1 VIEW COMPARISON:  Radiographs 01/16/2022 FINDINGS: No change from 01/16/2022. Airspace opacity in the right lower lung may represent chronic middle lobe collapse as there is slight rightward deviation of the mediastinum. Cardiomegaly. Aortic atherosclerotic calcification. Chronic bronchitic change. No displaced rib fractures. IMPRESSION: No change from 01/16/2022. Airspace opacity in the right lower lung may represent chronic middle lobe collapse as there is slight rightward deviation of the mediastinum. Electronically Signed   By: Placido Sou M.D.   On: 03/15/2022 23:54   CT ANGIO HEAD NECK W WO CM (CODE STROKE)  Result Date: 03/15/2022 CLINICAL DATA:  Stroke suspected EXAM: CT ANGIOGRAPHY HEAD AND NECK TECHNIQUE: Multidetector CT imaging of the head and neck was performed using the standard protocol during bolus administration of intravenous contrast. Multiplanar CT image reconstructions and MIPs were obtained to evaluate the vascular anatomy. Carotid stenosis measurements (when applicable) are obtained utilizing NASCET criteria, using the distal internal carotid diameter as the denominator. RADIATION DOSE REDUCTION: This exam was performed according to the departmental dose-optimization program which includes automated exposure control, adjustment of the mA and/or kV according to patient size and/or use of iterative reconstruction technique. CONTRAST:  27m OMNIPAQUE  IOHEXOL 350 MG/ML SOLN COMPARISON:  No prior CTA available, correlation is made with CT head 03/15/2022 FINDINGS: CT HEAD FINDINGS For noncontrast findings, please see same day CT head. CTA NECK FINDINGS Aortic arch: Incompletely imaged, but standard aortic branching is suspected.  Imaged portion shows no evidence of aneurysm or dissection. No significant stenosis of the major arch vessel origins. Right carotid system: No evidence of dissection, occlusion, or hemodynamically significant stenosis (greater than 50%). Atherosclerotic disease at the bifurcation and in the proximal ICA is not hemodynamically significant. Tortuous distal ICAs. Left carotid system: No evidence of dissection, occlusion, or hemodynamically significant stenosis (greater than 50%). Atherosclerotic disease at the bifurcation and in the proximal ICA is not hemodynamically significant. Vertebral arteries: No evidence of dissection, occlusion, or hemodynamically significant stenosis (greater than 50%). The left vertebral artery is diminutive throughout its course. Skeleton: No acute osseous abnormality. Severe degenerative changes in the cervical spine, with reversal of the normal cervical lordosis. Other neck: Negative. Upper chest: No focal pulmonary opacity or pleural effusion. Review of the MIP images confirms the above findings CTA HEAD FINDINGS Anterior circulation: Both internal carotid arteries are patent to the termini, with mild stenosis in the bilateral supraclinoid segments. Patent right A1. Severely hypoplastic left A1. Normal anterior communicating artery. Severe stenosis in the left A2 segment (series 7, images 70-72). Anterior cerebral arteries are otherwise patent to their distal aspects. No M1 stenosis or occlusion. MCA branches perfused and symmetric. Posterior circulation: The left vertebral artery terminates in PICA. The right vertebral artery is patent to the vertebrobasilar junction without stenosis. Posterior inferior  cerebellar arteries patent proximally. Basilar patent to its distal aspect. Superior cerebellar arteries patent proximally. Patent P1 segments. PCAs perfused to their distal aspects without stenosis. The bilateral posterior communicating arteries are not visualized. Venous sinuses: As permitted by contrast timing, patent. Anatomic variants: None significant. Review of the MIP images confirms the above findings IMPRESSION: 1. No intracranial large vessel occlusion. Severe stenosis in the left A2 segment. 2. No hemodynamically significant stenosis in the neck. Imaging results were communicated on 03/15/2022 at 11:26 pm to provider Dr. Rory Percy via secure text paging. Electronically Signed   By: Merilyn Baba M.D.   On: 03/15/2022 23:27   CT HEAD CODE STROKE WO CONTRAST  Result Date: 03/15/2022 CLINICAL DATA:  Code stroke. EXAM: CT HEAD WITHOUT CONTRAST TECHNIQUE: Contiguous axial images were obtained from the base of the skull through the vertex without intravenous contrast. RADIATION DOSE REDUCTION: This exam was performed according to the departmental dose-optimization program which includes automated exposure control, adjustment of the mA and/or kV according to patient size and/or use of iterative reconstruction technique. COMPARISON:  03/22/2020 FINDINGS: Brain: No evidence of acute infarction, hemorrhage, mass, mass effect, or midline shift. No hydrocephalus or extra-axial collection. Redemonstrated right greater than left frontal cortical infarcts. Periventricular white matter changes, likely the sequela of chronic small vessel ischemic disease. Vascular: No hyperdense vessel. Atherosclerotic calcifications in the intracranial carotid and vertebral arteries. Skull: Negative for fracture or focal lesion. Sinuses/Orbits: No acute finding. Status post bilateral lens replacements. Other: The mastoid air cells are well aerated. ASPECTS [facility] Stroke Program Early CT Score) - Ganglionic level infarction (caudate,  lentiform nuclei, internal capsule, insula, M1-M3 cortex): 7 - Supraganglionic infarction (M4-M6 cortex): 3 Total score (0-10 with 10 being normal): 10 IMPRESSION: 1. No acute intracranial process. 2. ASPECTS is 10. Imaging results were communicated on 03/15/2022 at 10:30 pm to provider Dr. Rory Percy via secure text paging. Electronically Signed   By: Merilyn Baba M.D.   On: 03/15/2022 22:30    Pending Labs Unresulted Labs (From admission, onward)     Start     Ordered   03/16/22 0500  Hemoglobin A1c  Tomorrow morning,  R        03/16/22 0129   03/16/22 0312  Urine Culture (for pregnant, neutropenic or urologic patients or patients with an indwelling urinary catheter)  (Urine Labs)  Once,   R       Question:  Indication  Answer:  Altered mental status (if no other cause identified)   03/16/22 0311            Vitals/Pain Today's Vitals   03/16/22 0709 03/16/22 0816 03/16/22 1001 03/16/22 1353  BP:   (!) 178/72 (!) 178/72  Pulse:   99 (!) 113  Resp:   (!) 21 (!) 23  Temp:  98.2 F (36.8 C) 98.2 F (36.8 C) 98.2 F (36.8 C)  TempSrc:  Oral Oral Oral  SpO2:   98% 95%  Weight:      Height:      PainSc: 0-No pain       Isolation Precautions No active isolations  Medications Medications  sodium chloride flush (NS) 0.9 % injection 3 mL (3 mLs Intravenous Not Given 03/15/22 2309)  cefTRIAXone (ROCEPHIN) 2 g in sodium chloride 0.9 % 100 mL IVPB (0 g Intravenous Stopped 03/16/22 1204)  labetalol (NORMODYNE) injection 5 mg (has no administration in time range)  0.9 %  sodium chloride infusion ( Intravenous New Bag/Given 03/16/22 0341)  aspirin EC tablet 81 mg (81 mg Oral Given 03/16/22 0339)  atorvastatin (LIPITOR) tablet 80 mg (80 mg Oral Given 03/16/22 1203)  enoxaparin (LOVENOX) injection 40 mg (0 mg Subcutaneous Hold 03/16/22 0945)  senna-docusate (Senokot-S) tablet 1 tablet (1 tablet Oral Given 03/16/22 1202)  polyethylene glycol (MIRALAX / GLYCOLAX) packet 17 g (has no administration in  time range)  acetaminophen (TYLENOL) tablet 650 mg (has no administration in time range)  prochlorperazine (COMPAZINE) injection 5 mg (has no administration in time range)  melatonin tablet 3 mg (has no administration in time range)  albuterol (PROVENTIL) (2.5 MG/3ML) 0.083% nebulizer solution 2.5 mg (has no administration in time range)  memantine (NAMENDA) tablet 10 mg (10 mg Oral Given 03/16/22 1203)  iohexol (OMNIPAQUE) 350 MG/ML injection 75 mL (75 mLs Intravenous Contrast Given 03/15/22 2240)  sodium chloride 0.9 % bolus 500 mL (0 mLs Intravenous Stopped 03/15/22 2347)  cefTRIAXone (ROCEPHIN) 1 g in sodium chloride 0.9 % 100 mL IVPB (0 g Intravenous Stopped 03/16/22 0018)  iohexol (OMNIPAQUE) 350 MG/ML injection 75 mL (75 mLs Intravenous Contrast Given 03/16/22 0108)    Mobility walks with device     Focused Assessments Neuro Assessment Handoff:  Swallow screen pass? Yes    NIH Stroke Scale  Dizziness Present: No Headache Present: No Interval: Other (Comment) (Q2) Level of Consciousness (1a.)   : Alert, keenly responsive LOC Questions (1b. )   : Answers both questions correctly LOC Commands (1c. )   : Performs both tasks correctly Best Gaze (2. )  : Normal Visual (3. )  : No visual loss Facial Palsy (4. )    : Normal symmetrical movements Motor Arm, Left (5a. )   : No drift Motor Arm, Right (5b. ) : No drift Motor Leg, Left (6a. )  : Drift Motor Leg, Right (6b. ) : No drift Limb Ataxia (7. ): Present in one limb Sensory (8. )  : Normal, no sensory loss Best Language (9. )  : No aphasia Dysarthria (10. ): Normal Extinction/Inattention (11.)   : No Abnormality Complete NIHSS TOTAL: 2 Last date known well: 03/15/22 Last time known well: 2130 Neuro Assessment: Within  Defined Limits Neuro Checks:   Initial (03/15/22 2245)  Has TPA been given? No If patient is a Neuro Trauma and patient is going to OR before floor call report to [place] nurse: [phone] or  [phone]   R Recommendations: See Admitting Provider Note  Report given to:   Additional Notes: Pt has dementia at baseline, not very compliant unless daughter is present

## 2022-03-16 NOTE — ED Notes (Signed)
ED TO INPATIENT HANDOFF REPORT  ED Nurse Name and Phone #:  Clydene Laming [phone]  S Name/Age/Gender Bridget [name] 89 y.o. female Room/Bed: 015C/015C  Code Status   Code Status: DNR  Home/SNF/Other Home Patient oriented to: self Is this baseline? Yes   Triage Complete: Triage complete  Chief Complaint CVA (cerebral vascular accident) [facility]) [I63.9]  Triage Note Patient arrived with EMS from home as a code stroke , LSN 2130 this evening , family reported slurred speech and right facial droop . Evaluated by EDP and neurologist at arrival and transported to CT scan .   Allergies Allergies  Allergen Reactions   Pulmicort [Budesonide] Swelling    Eye swelling, ankle and knee swelling   Erythromycin Other (See Comments)    Unknown, but "all the mycins bother me"   Roxicodone [Oxycodone] Other (See Comments)    Confusion    Ultram [Tramadol] Other (See Comments)    Confusion     Level of Care/Admitting Diagnosis ED Disposition     ED Disposition  Admit   Condition  --   [place]: [place] [[id number]]  Level of Care: Telemetry Medical [104]  May admit patient to [facility] or [facility] if equivalent level of care is available:: No  Covid Evaluation: Asymptomatic - no recent exposure (last 10 days) testing not required  Diagnosis: CVA (cerebral vascular accident) [facility]) [id number]  Admitting Physician: Kayleen Memos [id number]  Attending Physician: Kayleen Memos [id number]  Certification:: I certify this patient will need inpatient services for at least 2 midnights  Estimated Length of Stay: 2          B Medical/Surgery History Past Medical History:  Diagnosis Date   Atherosclerosis of aorta ([place])    Atrial fibrillation ([place])    Breast cancer ([place])    Chronic Bronchitis    Coronary atherosclerosis due to calcified coronary lesion    Nocturnal polyuria    Stroke ([place])    Thrombus of left atrial appendage    Past Surgical  History:  Procedure Laterality Date   BREAST LUMPECTOMY Left 2001   FEMUR FRACTURE SURGERY Right 2019   RIGHT - 2015   TOTAL ABDOMINAL HYSTERECTOMY  1967     A IV Location/Drains/Wounds Patient Lines/Drains/Airways Status     Active Line/Drains/Airways     Name Placement date Placement time Site Days   Peripheral IV 03/15/22 18 G Right Antecubital 03/15/22  --  Antecubital  1            Intake/Output Last 24 hours  Intake/Output Summary (Last 24 hours) at 03/16/2022 0125 Last data filed at 03/15/2022 2347 Gross per 24 hour  Intake 500 ml  Output --  Net 500 ml    Labs/Imaging Results for orders placed or performed during the hospital encounter of 03/15/22 (from the past 48 hour(s))  CBG monitoring, ED     Status: Abnormal   Collection Time: 03/15/22 10:16 PM  Result Value Ref Range   Glucose-Capillary 109 (H) 70 - 99 mg/dL    Comment: Glucose reference range applies only to samples taken after fasting for at least 8 hours.  Protime-INR     Status: None   Collection Time: 03/15/22 10:20 PM  Result Value Ref Range   Prothrombin Time 14.3 11.4 - 15.2 seconds   INR 1.1 0.8 - 1.2    Comment: (NOTE) INR goal varies based on device and disease states. Performed at [facility], [address] [place], [place] [address]   APTT     Status: None   Collection Time: 03/15/22 10:20 PM  Result Value Ref Range   aPTT 27 24 - 36 seconds    Comment: Performed at [facility] [address] [place], [place]  [address]  CBC     Status: Abnormal   Collection Time: 03/15/22 10:20 PM  Result Value Ref Range   WBC 5.5 4.0 - 10.5 K/uL   RBC 3.78 (L) 3.87 - 5.11 MIL/uL   Hemoglobin 12.6 12.0 - 15.0 g/dL   HCT 37.8 36.0 - 46.0 %   MCV 100.0 80.0 - 100.0 fL   MCH 33.3 26.0 - 34.0 pg   MCHC 33.3 30.0 - 36.0 g/dL   RDW 14.1 11.5 - 15.5 %   Platelets 210 150 - 400 K/uL   nRBC 0.0 0.0 - 0.2 %    Comment: Performed at [facility], [place] [address],  [place], [place] [address]  Differential     Status: None   Collection Time: 03/15/22 10:20 PM  Result Value Ref Range   Neutrophils Relative % 52 %   Neutro Abs 2.9 1.7 - 7.7 K/uL   Lymphocytes Relative 35 %   Lymphs Abs 1.9 0.7 - 4.0 K/uL   Monocytes Relative 10 %   Monocytes Absolute 0.6 0.1 - 1.0 K/uL   Eosinophils Relative 2 %   Eosinophils Absolute 0.1 0.0 - 0.5 K/uL   Basophils Relative 1 %   Basophils Absolute 0.0 0.0 - 0.1 K/uL   Immature Granulocytes 0 %   Abs Immature Granulocytes 0.02 0.00 - 0.07 K/uL    Comment: Performed at [facility] [address], [place], [place] [address]  Comprehensive metabolic panel     Status: Abnormal   Collection Time: 03/15/22 10:20 PM  Result Value Ref Range   Sodium 130 (L) 135 - 145 mmol/L   Potassium 4.9 3.5 - 5.1 mmol/L   Chloride 95 (L) 98 - 111 mmol/L   CO2 23 22 - 32 mmol/L   Glucose, Bld 92 70 - 99 mg/dL    Comment: Glucose reference range applies only to samples taken after fasting for at least 8 hours.   BUN 17 8 - 23 mg/dL   Creatinine, Ser 0.81 0.44 - 1.00 mg/dL   Calcium 9.3 8.9 - 10.3 mg/dL   Total Protein 6.1 (L) 6.5 - 8.1 g/dL   Albumin 3.4 (L) 3.5 - 5.0 g/dL   AST 21 15 - 41 U/L   ALT 13 0 - 44 U/L   Alkaline Phosphatase 55 38 - 126 U/L   Total Bilirubin 0.8 0.3 - 1.2 mg/dL   GFR, Estimated >60 >60 mL/min    Comment: (NOTE) Calculated using the CKD-EPI Creatinine Equation (2021)    Anion gap 12 5 - 15    Comment: Performed at [facility], [place] [address] [place], [place] [address]  Ethanol     Status: None   Collection Time: 03/15/22 10:20 PM  Result Value Ref Range   Alcohol, Ethyl (B) <10 <10 mg/dL    Comment: (NOTE) Lowest detectable limit for serum alcohol is 10 mg/dL.  For medical purposes only. Performed at [facility], [place] [address] [place], [place] [address]   I-stat chem 8, ED     Status: Abnormal   Collection Time: 03/15/22 10:28 PM  Result Value Ref Range   Sodium 130 (L)  135 - 145  mmol/L   Potassium 4.9 3.5 - 5.1 mmol/L   Chloride 97 (L) 98 - 111 mmol/L   BUN 22 8 - 23 mg/dL   Creatinine, Ser 0.90 0.44 - 1.00 mg/dL   Glucose, Bld 92 70 - 99 mg/dL    Comment: Glucose reference range applies only to samples taken after fasting for at least 8 hours.   Calcium, Ion 1.05 (L) 1.15 - 1.40 mmol/L   TCO2 28 22 - 32 mmol/L   Hemoglobin 12.9 12.0 - 15.0 g/dL   HCT 38.0 36.0 - 46.0 %  Urinalysis, Routine w reflex microscopic -Urine, Clean Catch     Status: Abnormal   Collection Time: 03/15/22 11:00 PM  Result Value Ref Range   Color, Urine YELLOW YELLOW   APPearance CLOUDY (A) CLEAR   Specific Gravity, Urine 1.021 1.005 - 1.030   pH 6.0 5.0 - 8.0   Glucose, UA NEGATIVE NEGATIVE mg/dL   Hgb urine dipstick NEGATIVE NEGATIVE   Bilirubin Urine NEGATIVE NEGATIVE   Ketones, ur NEGATIVE NEGATIVE mg/dL   Protein, ur NEGATIVE NEGATIVE mg/dL   Nitrite POSITIVE (A) NEGATIVE   Leukocytes,Ua LARGE (A) NEGATIVE   RBC / HPF 6-10 0 - 5 RBC/hpf   WBC, UA >50 0 - 5 WBC/hpf   Bacteria, UA RARE (A) NONE SEEN   Squamous Epithelial / HPF 0-5 0 - 5 /HPF   Mucus PRESENT     Comment: Performed at [facility], [address], [place], [place] [address]   CT ABDOMEN PELVIS W CONTRAST  Result Date: 03/16/2022 CLINICAL DATA:  Abdominal pain. EXAM: CT ABDOMEN AND PELVIS WITH CONTRAST TECHNIQUE: Multidetector CT imaging of the abdomen and pelvis was performed using the standard protocol following bolus administration of intravenous contrast. RADIATION DOSE REDUCTION: This exam was performed according to the departmental dose-optimization program which includes automated exposure control, adjustment of the mA and/or kV according to patient size and/or use of iterative reconstruction technique. CONTRAST:  30m OMNIPAQUE IOHEXOL 350 MG/ML SOLN COMPARISON:  None Available. FINDINGS: Evaluation of this exam is limited due to respiratory motion as well as loose stool streak artifact caused  by patient's arms. Lower chest: There are bibasilar interstitial coarsening. There is moderate cardiomegaly. Three vessel coronary vascular calcification. No intra-abdominal free air for free fluid. Hepatobiliary: Several small liver cysts. The liver is otherwise unremarkable. No biliary ductal dilatation. The gallbladder is unremarkable. Pancreas: The pancreas is unremarkable as visualized. Spleen: Normal in size without focal abnormality. Adrenals/Urinary Tract: The adrenal glands unremarkable. There is no hydronephrosis on either side. There is symmetric enhancement and excretion of contrast by both kidneys. The visualized ureters and urinary bladder appear unremarkable. Stomach/Bowel: There is moderate stool throughout the colon. There are scattered sigmoid diverticula without active inflammatory changes. There is no bowel obstruction or active inflammation. The appendix is not visualized with certainty. No inflammatory changes identified in the right lower quadrant. Vascular/Lymphatic: The aorta is tortuous. There is advanced aortoiliac atherosclerotic disease. An infrarenal IVC filter is noted. No portal venous gas. There is no adenopathy. Reproductive: The uterus is not visualized, likely surgically absent. There is a 3.2 cm right ovarian cyst. Soft tissue associated with the cyst likely represent the ovarian tissue. This can be better evaluated with ultrasound on a nonemergent/outpatient basis. The left ovary is not identified with certainty. Other: None Musculoskeletal: Osteopenia with degenerative changes of the spine. Bilateral femoral neck ORIF. No acute osseous pathology. IMPRESSION: 1. No acute intra-abdominal or pelvic pathology. 2. Constipation.  No bowel obstruction. 3. Sigmoid diverticulosis. 4. A 3.2 cm right ovarian cyst. This can be better evaluated with ultrasound on a nonemergent/outpatient basis. 5.  Aortic Atherosclerosis (ICD10-I70.0). Electronically Signed   By: Anner Crete M.D.   On:  03/16/2022 01:17   MR BRAIN WO CONTRAST  Result Date: 03/16/2022 CLINICAL DATA:  Neuro deficit, stroke suspected EXAM: MRI HEAD WITHOUT CONTRAST TECHNIQUE: Multiplanar, multiecho pulse sequences of the brain and surrounding structures were obtained without intravenous contrast. COMPARISON:  No prior MRI available, correlation is made with CT head 03/15/2022 FINDINGS: Brain: Restricted diffusion with ADC correlate in the left posterior frontal cortex, including the precentral gyrus (series 5, images 82-87), consistent with acute infarcts. This areas not associated with significantly increased T2 hyperintense signal. Additional smaller foci of mildly hyperintense signal on diffusion-weighted imaging without definite ADC correlate in the more anterior left frontal lobe may represent more subacute infarcts (series 5, images 82-83). No acute hemorrhage, mass, mass effect, or midline shift. No hydrocephalus or extra-axial collection. Encephalomalacia in the right greater than left frontal lobes, consistent with remote infarcts. Age related cerebral atrophy, with slightly more pronounced atrophy in the bilateral medial temporal lobes. T2 hyperintense signal in the periventricular white matter, likely the sequela of moderate chronic small vessel ischemic disease. Vascular: Normal arterial flow voids. Skull and upper cervical spine: Normal marrow signal. Sinuses/Orbits: Clear paranasal sinuses. Status post bilateral lens replacements. Other: Fluid in the mastoid air cells. IMPRESSION: Acute infarcts in the left posterior frontal cortex, including the precentral gyrus. Additional smaller subacute infarcts in the more anterior left frontal lobe. Imaging results were communicated on 03/16/2022 at 12:43 am to provider Dr. Rory Percy via secure text paging. Electronically Signed   By: Merilyn Baba M.D.   On: 03/16/2022 00:47   DG Chest Port 1 View  Result Date: 03/15/2022 CLINICAL DATA:  Screening EXAM: PORTABLE CHEST 1 VIEW  COMPARISON:  Radiographs 01/16/2022 FINDINGS: No change from 01/16/2022. Airspace opacity in the right lower lung may represent chronic middle lobe collapse as there is slight rightward deviation of the mediastinum. Cardiomegaly. Aortic atherosclerotic calcification. Chronic bronchitic change. No displaced rib fractures. IMPRESSION: No change from 01/16/2022. Airspace opacity in the right lower lung may represent chronic middle lobe collapse as there is slight rightward deviation of the mediastinum. Electronically Signed   By: Placido Sou M.D.   On: 03/15/2022 23:54   CT ANGIO HEAD NECK W WO CM (CODE STROKE)  Result Date: 03/15/2022 CLINICAL DATA:  Stroke suspected EXAM: CT ANGIOGRAPHY HEAD AND NECK TECHNIQUE: Multidetector CT imaging of the head and neck was performed using the standard protocol during bolus administration of intravenous contrast. Multiplanar CT image reconstructions and MIPs were obtained to evaluate the vascular anatomy. Carotid stenosis measurements (when applicable) are obtained utilizing NASCET criteria, using the distal internal carotid diameter as the denominator. RADIATION DOSE REDUCTION: This exam was performed according to the departmental dose-optimization program which includes automated exposure control, adjustment of the mA and/or kV according to patient size and/or use of iterative reconstruction technique. CONTRAST:  82m OMNIPAQUE IOHEXOL 350 MG/ML SOLN COMPARISON:  No prior CTA available, correlation is made with CT head 03/15/2022 FINDINGS: CT HEAD FINDINGS For noncontrast findings, please see same day CT head. CTA NECK FINDINGS Aortic arch: Incompletely imaged, but standard aortic branching is suspected. Imaged portion shows no evidence of aneurysm or dissection. No significant stenosis of the major arch vessel origins. Right carotid system: No evidence of dissection, occlusion, or hemodynamically significant stenosis (greater than  50%). Atherosclerotic disease at the  bifurcation and in the proximal ICA is not hemodynamically significant. Tortuous distal ICAs. Left carotid system: No evidence of dissection, occlusion, or hemodynamically significant stenosis (greater than 50%). Atherosclerotic disease at the bifurcation and in the proximal ICA is not hemodynamically significant. Vertebral arteries: No evidence of dissection, occlusion, or hemodynamically significant stenosis (greater than 50%). The left vertebral artery is diminutive throughout its course. Skeleton: No acute osseous abnormality. Severe degenerative changes in the cervical spine, with reversal of the normal cervical lordosis. Other neck: Negative. Upper chest: No focal pulmonary opacity or pleural effusion. Review of the MIP images confirms the above findings CTA HEAD FINDINGS Anterior circulation: Both internal carotid arteries are patent to the termini, with mild stenosis in the bilateral supraclinoid segments. Patent right A1. Severely hypoplastic left A1. Normal anterior communicating artery. Severe stenosis in the left A2 segment (series 7, images 70-72). Anterior cerebral arteries are otherwise patent to their distal aspects. No M1 stenosis or occlusion. MCA branches perfused and symmetric. Posterior circulation: The left vertebral artery terminates in PICA. The right vertebral artery is patent to the vertebrobasilar junction without stenosis. Posterior inferior cerebellar arteries patent proximally. Basilar patent to its distal aspect. Superior cerebellar arteries patent proximally. Patent P1 segments. PCAs perfused to their distal aspects without stenosis. The bilateral posterior communicating arteries are not visualized. Venous sinuses: As permitted by contrast timing, patent. Anatomic variants: None significant. Review of the MIP images confirms the above findings IMPRESSION: 1. No intracranial large vessel occlusion. Severe stenosis in the left A2 segment. 2. No hemodynamically significant stenosis in  the neck. Imaging results were communicated on 03/15/2022 at 11:26 pm to provider Dr. Rory Percy via secure text paging. Electronically Signed   By: Merilyn Baba M.D.   On: 03/15/2022 23:27   CT HEAD CODE STROKE WO CONTRAST  Result Date: 03/15/2022 CLINICAL DATA:  Code stroke. EXAM: CT HEAD WITHOUT CONTRAST TECHNIQUE: Contiguous axial images were obtained from the base of the skull through the vertex without intravenous contrast. RADIATION DOSE REDUCTION: This exam was performed according to the departmental dose-optimization program which includes automated exposure control, adjustment of the mA and/or kV according to patient size and/or use of iterative reconstruction technique. COMPARISON:  03/22/2020 FINDINGS: Brain: No evidence of acute infarction, hemorrhage, mass, mass effect, or midline shift. No hydrocephalus or extra-axial collection. Redemonstrated right greater than left frontal cortical infarcts. Periventricular white matter changes, likely the sequela of chronic small vessel ischemic disease. Vascular: No hyperdense vessel. Atherosclerotic calcifications in the intracranial carotid and vertebral arteries. Skull: Negative for fracture or focal lesion. Sinuses/Orbits: No acute finding. Status post bilateral lens replacements. Other: The mastoid air cells are well aerated. ASPECTS [facility] Stroke Program Early CT Score) - Ganglionic level infarction (caudate, lentiform nuclei, internal capsule, insula, M1-M3 cortex): 7 - Supraganglionic infarction (M4-M6 cortex): 3 Total score (0-10 with 10 being normal): 10 IMPRESSION: 1. No acute intracranial process. 2. ASPECTS is 10. Imaging results were communicated on 03/15/2022 at 10:30 pm to provider Dr. Rory Percy via secure text paging. Electronically Signed   By: Merilyn Baba M.D.   On: 03/15/2022 22:30    Pending Labs Unresulted Labs (From admission, onward)    None       Vitals/Pain Today's Vitals   03/15/22 2345 03/16/22 0000 03/16/22 0115 03/16/22 0124   BP: (!) 157/64 (!) 153/72 (!) 170/72   Pulse: 76 84 77   Resp: '16 17 17   '$ Temp:      TempSrc:  SpO2: 96% 97% 96%   Weight:      Height:      PainSc:    0-No pain    Isolation Precautions No active isolations  Medications Medications  sodium chloride flush (NS) 0.9 % injection 3 mL (3 mLs Intravenous Not Given 03/15/22 2309)  iohexol (OMNIPAQUE) 350 MG/ML injection 75 mL (75 mLs Intravenous Contrast Given 03/15/22 2240)  sodium chloride 0.9 % bolus 500 mL (0 mLs Intravenous Stopped 03/15/22 2347)  cefTRIAXone (ROCEPHIN) 1 g in sodium chloride 0.9 % 100 mL IVPB (0 g Intravenous Stopped 03/16/22 0018)  iohexol (OMNIPAQUE) 350 MG/ML injection 75 mL (75 mLs Intravenous Contrast Given 03/16/22 0108)    Mobility non-ambulatory     Focused Assessments    R Recommendations: See Admitting Provider Note  Report given to:   Additional Notes:

## 2022-03-17 DIAGNOSIS — I6300 Cerebral infarction due to thrombosis of unspecified precerebral artery: Secondary | ICD-10-CM | POA: Diagnosis not present

## 2022-03-17 DIAGNOSIS — I639 Cerebral infarction, unspecified: Secondary | ICD-10-CM | POA: Diagnosis not present

## 2022-03-17 LAB — HEMOGLOBIN A1C
Hgb A1c MFr Bld: 5.6 % (ref 4.8–5.6)
Mean Plasma Glucose: 114 mg/dL

## 2022-03-17 MED ORDER — UMECLIDINIUM BROMIDE 62.5 MCG/ACT IN AEPB
1.0000 | INHALATION_SPRAY | Freq: Every day | RESPIRATORY_TRACT | Status: DC
  Administered 2022-03-19 – 2022-03-20 (×2): 1 via RESPIRATORY_TRACT
  Filled 2022-03-17: qty 7

## 2022-03-17 MED ORDER — STROKE: EARLY STAGES OF RECOVERY BOOK
Freq: Once | Status: DC
  Filled 2022-03-17: qty 1

## 2022-03-17 MED ORDER — BUDESON-GLYCOPYRROL-FORMOTEROL 160-9-4.8 MCG/ACT IN AERO: 2.0000 | INHALATION_SPRAY | Freq: Two times a day (BID) | RESPIRATORY_TRACT | Status: DC

## 2022-03-17 MED ORDER — DILTIAZEM HCL ER COATED BEADS 180 MG PO CP24
360.0000 mg | ORAL_CAPSULE | Freq: Every day | ORAL | Status: DC
  Administered 2022-03-18 – 2022-03-20 (×3): 360 mg via ORAL
  Filled 2022-03-17 (×3): qty 2

## 2022-03-17 MED ORDER — APIXABAN 5 MG PO TABS
5.0000 mg | ORAL_TABLET | Freq: Two times a day (BID) | ORAL | Status: DC
  Administered 2022-03-17 – 2022-03-20 (×7): 5 mg via ORAL
  Filled 2022-03-17 (×7): qty 1

## 2022-03-17 MED ORDER — MOMETASONE FURO-FORMOTEROL FUM 200-5 MCG/ACT IN AERO
2.0000 | INHALATION_SPRAY | Freq: Two times a day (BID) | RESPIRATORY_TRACT | Status: DC
  Administered 2022-03-17 – 2022-03-20 (×6): 2 via RESPIRATORY_TRACT
  Filled 2022-03-17: qty 8.8

## 2022-03-17 MED ORDER — OVER THE COUNTER MEDICATION: 1.0000 | Freq: Every day | Status: DC

## 2022-03-17 MED ORDER — DILTIAZEM HCL 60 MG PO TABS
90.0000 mg | ORAL_TABLET | Freq: Four times a day (QID) | ORAL | Status: DC
  Administered 2022-03-17 (×2): 90 mg via ORAL
  Filled 2022-03-17 (×2): qty 1

## 2022-03-17 MED ORDER — MELATONIN 3 MG PO TABS
3.0000 mg | ORAL_TABLET | Freq: Every day | ORAL | Status: DC
  Administered 2022-03-17 – 2022-03-19 (×3): 3 mg via ORAL
  Filled 2022-03-17 (×3): qty 1

## 2022-03-17 MED ORDER — OVER THE COUNTER MEDICATION: 1.0000 | Freq: Every morning | Status: DC

## 2022-03-17 NOTE — Progress Notes (Signed)
Inpatient Rehab Coordinator Note:  I met with pt and her 2 daughters Kennyth Lose and Vania Rea) at bedside to discuss CIR recommendations and goals/expectations of CIR stay.  We reviewed 3 hrs/day of therapy, physician follow up, and average length of stay 2 weeks (dependent upon progress) with goals of min to mod assist.  We discussed possibility of w/c level goals if pt continues to progress slowly.  We discussed Medicare benefits for CIR.  Plan for pt to d/c home with Kennyth Lose providing 24/7 assist (consistent with baseline).  Will follow for potential admit pending bed availability.   Shann Medal, PT, DPT Admissions Coordinator [phone] 03/17/22  3:50 PM

## 2022-03-17 NOTE — Plan of Care (Signed)
  Problem: Education: Goal: Knowledge of General Education information will improve Description: Including pain rating scale, medication(s)/side effects and non-pharmacologic comfort measures Outcome: Progressing   Problem: Education: Goal: Knowledge of General Education information will improve Description: Including pain rating scale, medication(s)/side effects and non-pharmacologic comfort measures Outcome: Progressing   Problem: Clinical Measurements: Goal: Will remain free from infection Outcome: Progressing Goal: Respiratory complications will improve Outcome: Progressing

## 2022-03-17 NOTE — Progress Notes (Signed)
Physical Therapy Treatment Patient Details Name: Bridget [name] MRN: [id number] DOB: 01/23/1921 Today's Date: 03/17/2022   History of Present Illness Bridget [name] is a 89 y.o. female who presents 03/15/2022 who presented to [facility] ED from home as a code stroke with right facial drooling and dysarthria. Acute infarcts in the left posterior frontal cortex, including the  precentral gyrus.  UA was positive for pyuri. Medical history significant for dementia requiring 24/7 assistance, permanent atrial fibrillation on Eliquis, thrombus of atrial appendage, prior history of stroke with no residual deficits, history of breast cancer status post lumpectomy, osteoporosis and bronchiectasis.    PT Comments    Pt limited during today's session by cognition, requiring increased time for mobility and encouragement, family reporting pt has not slept well and has not been herself, mild agitated throughout session. Pt able to sit EOB ~10-15 minutes with min-modA due to posterior lean. Pt briefly able to maintain sitting balance with close guard but assist required with fatigue. Pt declining any attempts at standing or transfers today. Able to participate in 5 reps of LAQ B, but declining any further attempts at BLE exercises. ModA required for supine>sit with pt initiation of LLE and trunk, maxA required for return to supine for trunk and BLE management. Acute PT will continue to follow up with pt as appropriate to progress mobility and address deficits, discharge plan remains appropriate and we will continue to assess during admission.     Recommendations for follow up therapy are one component of a multi-disciplinary discharge planning process, led by the attending physician.  Recommendations may be updated based on patient status, additional functional criteria and insurance authorization.  Follow Up Recommendations  Acute inpatient rehab (3hours/day)     Assistance Recommended at Discharge Frequent or constant  Supervision/Assistance  Patient can return home with the following Two people to help with walking and/or transfers;A lot of help with bathing/dressing/bathroom;Assist for transportation;Help with stairs or ramp for entrance   Equipment Recommendations  Other (comment) (TBA)    Recommendations for Other Services Rehab consult     Precautions / Restrictions Precautions Precautions: Fall Precaution Comments: watch HR Restrictions Weight Bearing Restrictions: No     Mobility  Bed Mobility Overal bed mobility: Needs Assistance Bed Mobility: Supine to Sit, Sit to Supine     Supine to sit: Mod assist, HOB elevated Sit to supine: Max assist, HOB elevated   General bed mobility comments: Assist for BLE management and trunk support with use of bed rail, pt initiating with supine>sit able to pull on therapist to assist into sitting, maxA required for trunk and BLE to return to supine    Transfers                   General transfer comment: pt declining any attempts with increased encouragement    Ambulation/Gait                   Stairs             Wheelchair Mobility    Modified Rankin (Stroke Patients Only)       Balance Overall balance assessment: Needs assistance Sitting-balance support: Feet supported, No upper extremity supported Sitting balance-Leahy Scale: Poor Sitting balance - Comments: pt with BUE in lap, requiring min-modA to maintain sitting balance but brief moments of minG and use of trunk to correct posterior lean Postural control: Posterior lean  Cognition Arousal/Alertness: Awake/alert Behavior During Therapy: Agitated Overall Cognitive Status: History of cognitive impairments - at baseline                                 General Comments: Family at bedside and reporting pt did not sleep last night, pt disoriented to children and herself. Intermittently following  commands today but encouragement required. Pt lacking awareness of deficits at this time, family reporting cognition is different from baseline        Exercises General Exercises - Lower Extremity Long Arc Quad: Both, 5 reps, Seated, AROM    General Comments General comments (skin integrity, edema, etc.): VSS while EOB, family present throughout, educated family on positioning, pt declining pillow under RUE but instructed family on positioning if she was more open to them later      Pertinent Vitals/Pain Pain Assessment Pain Assessment: No/denies pain    Home Living                          Prior Function            PT Goals (current goals can now be found in the care plan section) Acute Rehab PT Goals Patient Stated Goal: did not state PT Goal Formulation: With patient Time For Goal Achievement: 03/30/22 Potential to Achieve Goals: Good Progress towards PT goals: Progressing toward goals    Frequency    Min 4X/week      PT Plan Current plan remains appropriate    Co-evaluation              AM-PAC PT "6 Clicks" Mobility   Outcome Measure  Help needed turning from your back to your side while in a flat bed without using bedrails?: A Lot Help needed moving from lying on your back to sitting on the side of a flat bed without using bedrails?: A Lot Help needed moving to and from a bed to a chair (including a wheelchair)?: Total Help needed standing up from a chair using your arms (e.g., wheelchair or bedside chair)?: Total Help needed to walk in hospital room?: Total Help needed climbing 3-5 steps with a railing? : Total 6 Click Score: 8    End of Session   Activity Tolerance: Treatment limited secondary to agitation Patient left: in bed;with call bell/phone within reach;with bed alarm set;with family/visitor present Nurse Communication: Mobility status PT Visit Diagnosis: Unsteadiness on feet (R26.81);Hemiplegia and hemiparesis Hemiplegia -  Right/Left: Right Hemiplegia - dominant/non-dominant: Dominant Hemiplegia - caused by: Cerebral infarction     Time: [id number] PT Time Calculation (min) (ACUTE ONLY): 21 min  Charges:  $Therapeutic Activity: 8-22 mins                     Charlynne Cousins, PT DPT Acute Rehabilitation Services Office [phone]    Luvenia Heller 03/17/2022, 1:32 PM

## 2022-03-17 NOTE — Progress Notes (Signed)
PROGRESS NOTE    ADALIS LIM  [id number] DOB: 1921-08-06 DOA: 03/15/2022 PCP: Haywood Pao, MD    Brief Narrative:   Bridget [name] is a 89 y.o. female with past medical history significant for dementia, permanent atrial fibrillation, history of CVA, CAD, history of breast cancer, osteoporosis, history of thrombus left atrial appendage, pulmonary nodule who presented to [facility] ED on 3/9 via EMS from home with right-sided facial droop, slurred speech.  On arrival to the ED, code stroke was initiated.  Last known normal was 2130.  Patient was having a hard time formulating her sentences with garbled speech.  CT head with no acute intracranial process, CT angiogram head/neck with no emergent LVO.  Not a candidate for IV thrombolytics due to Eliquis use.  MR brain with acute CVA left precentral gyrus and posterior frontal lobe.  TRH consulted for admission for further evaluation management of acute CVA.  Assessment & Plan:   Acute CVA Patient presenting to the ED with right-sided facial droop, slurred speech.  CT head and CT head/neck with no acute process/no emergent LVO.  MRI brain with acute CVA with left precentral gyrus, posterior frontal lobe, anterior frontal lobe findings.  Seen by neurology, not a candidate for thrombolytics due to Eliquis use.  LDL 154, hemoglobin A1c 5.6.  TTE with LVEF 55-60%, LA severely dilated.  Neurology now signed off. -- Atorvastatin 80 mg p.o. daily -- Continue Eliquis 5 mg p.o. twice daily -- PT/OT recommending CIR, awaiting placement -- Outpatient follow-up with neurology  Permanent atrial fibrillation with RVR LAA thrombus At baseline on Eliquis 2.5 mg p.o. twice daily, diltiazem 180 mg p.o. daily. -- Cardiology following, appreciate assistance -- Cardizem 90 mg p.o. every 6 hours -- Eliquis 5 mg p.o. twice daily -- Continue monitor on telemetry  Proteus urinary tract infection -- Urine culture positive for > 100K Proteus, susceptibilities  pending -- Ceftriaxone 2 g IV every 24 hours  Essential hypertension Home regimen includes diltiazem 90 mg p.o. daily. -- Diltiazem 90 mg p.o. every 6 hours per cardiology  Hyperlipidemia LDL elevated 134 with goal less than 70.  Added atorvastatin 80 mg p.o. daily.  Diabetes mellitus Diet controlled at baseline.  Hemoglobin A1c 5.6.   DVT prophylaxis:  apixaban (ELIQUIS) tablet 5 mg    Code Status: DNR Family Communication: Updated multiple family members including 2 sisters, granddaughter and son present at bedside this morning and once again this afternoon  Disposition Plan:  Level of care: Telemetry Medical Status is: Inpatient Remains inpatient appropriate because: Pending CIR evaluation    Consultants:  Neurology -signed off 3/11 Cardiology, Dr. Ed Blalock  Procedures:  TTE  Antimicrobials:  Ceftriaxone   Subjective: Patient seen examined bedside, resting comfortably.  Lying in bed.  Pleasantly confused.  Son, 2 daughters, and granddaughter present.  Seen by neurology, now signed off.  Cardiology adjusting diltiazem today.  Heart rate controlled on telemetry.  Updated family regarding CIR evaluation that is pending.  Family requesting patient receives her CBD Gummies and melatonin, they are fairly adamant about this.  No other specific questions or concerns at this time.  Unable to obtain any further ROS from patient due to her advanced dementia.  Objective: Vitals:   03/17/22 0000 03/17/22 0400 03/17/22 0800 03/17/22 1200  BP: (!) 158/79 (!) 158/66 (!) 159/77 133/65  Pulse: 91 (!) 102 (!) 59 73  Resp: 16 (!) 25 (!) 25 (!) 26  Temp: 98 F (36.7 C) 98.4 F (36.9 C) 98.5  F (36.9 C)   TempSrc: Oral Axillary Oral   SpO2: 91% 90% 90% 92%  Weight:      Height:        Intake/Output Summary (Last 24 hours) at 03/17/2022 1356 Last data filed at 03/17/2022 0600 Gross per 24 hour  Intake 515.94 ml  Output --  Net 515.94 ml   Filed Weights   03/15/22 2200  Weight:  62.8 kg    Examination:  Physical Exam: GEN: NAD, alert, pleasantly confused, elderly in appearance HEENT: NCAT, PERRL, EOMI, sclera clear, MMM PULM: CTAB w/o wheezes/crackles, normal respiratory effort CV: IRR, normal rate, w/o M/G/R GI: abd soft, NTND, NABS MSK: no peripheral edema, muscle strength asymmetric right weaker than left NEURO: No facial droop, facial sensation intact, tongue midline, PERRL, EOMI, right upper/lower extremity weakness in comparison to left, distracted PSYCH: Pleasantly confused Integumentary: No concerning rashes/lesions/wounds noted on exposed skin surfaces.    Data Reviewed: I have personally reviewed following labs and imaging studies  CBC: Recent Labs  Lab 03/15/22 2220 03/15/22 2228 03/16/22 0450  WBC 5.5  --  4.4  NEUTROABS 2.9  --   --   HGB 12.6 12.9 12.8  HCT 37.8 38.0 38.2  MCV 100.0  --  98.2  PLT 210  --  [id number]   Basic Metabolic Panel: Recent Labs  Lab 03/15/22 2220 03/15/22 2228 03/16/22 0450  NA 130* 130* 136  K 4.9 4.9 4.1  CL 95* 97* 101  CO2 23  --  25  GLUCOSE 92 92 109*  BUN '17 22 15  '$ CREATININE 0.81 0.90 0.75  CALCIUM 9.3  --  8.8*  MG  --   --  2.2  PHOS  --   --  3.7   GFR: Estimated Creatinine Clearance: 38.2 mL/min (by C-G formula based on SCr of 0.75 mg/dL). Liver Function Tests: Recent Labs  Lab 03/15/22 2220  AST 21  ALT 13  ALKPHOS 55  BILITOT 0.8  PROT 6.1*  ALBUMIN 3.4*   No results for input(s): "LIPASE", "AMYLASE" in the last 168 hours. No results for input(s): "AMMONIA" in the last 168 hours. Coagulation Profile: Recent Labs  Lab 03/15/22 2220  INR 1.1   Cardiac Enzymes: No results for input(s): "CKTOTAL", "CKMB", "CKMBINDEX", "TROPONINI" in the last 168 hours. BNP (last 3 results) No results for input(s): "PROBNP" in the last 8760 hours. HbA1C: Recent Labs    03/16/22 0144  HGBA1C 5.6   CBG: Recent Labs  Lab 03/15/22 2216  GLUCAP 109*   Lipid Profile: Recent Labs     03/16/22 0144  CHOL 252*  HDL 84  LDLCALC 154*  TRIG 68  CHOLHDL 3.0   Thyroid Function Tests: No results for input(s): "TSH", "T4TOTAL", "FREET4", "T3FREE", "THYROIDAB" in the last 72 hours. Anemia Panel: No results for input(s): "VITAMINB12", "FOLATE", "FERRITIN", "TIBC", "IRON", "RETICCTPCT" in the last 72 hours. Sepsis Labs: No results for input(s): "PROCALCITON", "LATICACIDVEN" in the last 168 hours.  Recent Results (from the past 240 hour(s))  Urine Culture (for pregnant, neutropenic or urologic patients or patients with an indwelling urinary catheter)     Status: Abnormal (Preliminary result)   Collection Time: 03/15/22 11:00 PM   Specimen: Urine, Clean Catch  Result Value Ref Range Status   Specimen Description URINE, CLEAN CATCH  Final   Special Requests NONE  Final   Culture (A)  Final    >=100,000 COLONIES/mL PROTEUS MIRABILIS SUSCEPTIBILITIES TO FOLLOW Performed at [facility], [address],  [place], [place] [address]    Report Status PENDING  Incomplete         Radiology Studies: ECHOCARDIOGRAM COMPLETE BUBBLE STUDY  Result Date: 03/16/2022    ECHOCARDIOGRAM REPORT   Patient Name:   INAS KORHONEN Date of Exam: 03/16/2022 Medical Rec #:  [id number]    Height:       67.0 in Accession #:    [id number]   Weight:       138.4 lb Date of Birth:  [birth year]    BSA:          1.730 m Patient Age:    89 years     BP:           159/83 mmHg Patient Gender: F            HR:           102 bpm. Exam Location:  Inpatient Procedure: 2D Echo, Cardiac Doppler and Color Doppler Indications:    Stroke 434.91 / I63.9  History:        Patient has prior history of Echocardiogram examinations, most                 recent 09/20/2020. Stroke; Risk Factors:Former Smoker.  Sonographer:    Wilkie Aye RVT RCS Referring Phys: [id number] [place]  1. Left ventricular ejection fraction, by estimation, is 55 to 60%. The left ventricle has normal function. The left ventricle has no  regional wall motion abnormalities. Left ventricular diastolic parameters are indeterminate.  2. Right ventricular systolic function is normal. The right ventricular size is mildly enlarged. There is mildly elevated pulmonary artery systolic pressure.  3. Left atrial size was severely dilated.  4. Right atrial size was severely dilated.  5. The mitral valve is abnormal. Mild mitral valve regurgitation. No evidence of mitral stenosis.  6. The tricuspid valve is abnormal. Tricuspid valve regurgitation is moderate to severe.  7. The aortic valve was not well visualized. There is severe calcifcation of the aortic valve. There is severe thickening of the aortic valve. Aortic valve regurgitation is moderate. No aortic stenosis is present.  8. Agitated saline contrast bubble study was negative, with no evidence of any interatrial shunt. FINDINGS  Left Ventricle: Left ventricular ejection fraction, by estimation, is 55 to 60%. The left ventricle has normal function. The left ventricle has no regional wall motion abnormalities. The left ventricular internal cavity size was normal in size. There is  no left ventricular hypertrophy. Left ventricular diastolic parameters are indeterminate. Right Ventricle: The right ventricular size is mildly enlarged. Right vetricular wall thickness was not well visualized. Right ventricular systolic function is normal. There is mildly elevated pulmonary artery systolic pressure. The tricuspid regurgitant  velocity is 2.97 m/s, and with an assumed right atrial pressure of 3 mmHg, the estimated right ventricular systolic pressure is 0000000 mmHg. Left Atrium: Left atrial size was severely dilated. Right Atrium: Right atrial size was severely dilated. Pericardium: There is no evidence of pericardial effusion. Mitral Valve: The mitral valve is abnormal. Mild mitral valve regurgitation. No evidence of mitral valve stenosis. Tricuspid Valve: The tricuspid valve is abnormal. Tricuspid valve  regurgitation is moderate to severe. No evidence of tricuspid stenosis. Aortic Valve: The aortic valve was not well visualized. There is severe calcifcation of the aortic valve. There is severe thickening of the aortic valve. There is severe aortic valve annular calcification. Aortic valve regurgitation is moderate. No aortic stenosis is present. Aortic valve mean gradient measures 8.0  mmHg. Aortic valve peak gradient measures 14.1 mmHg. Aortic valve area, by VTI measures 2.50 cm. Pulmonic Valve: The pulmonic valve was not well visualized. Pulmonic valve regurgitation is not visualized. No evidence of pulmonic stenosis. Aorta: The aortic root is normal in size and structure. IAS/Shunts: No atrial level shunt detected by color flow Doppler. Agitated saline contrast bubble study was negative, with no evidence of any interatrial shunt.  LEFT VENTRICLE PLAX 2D LVIDd:         4.30 cm   Diastology LVIDs:         3.90 cm   LV e' medial:    6.50 cm/s LV PW:         1.05 cm   LV E/e' medial:  17.6 LV IVS:        1.05 cm   LV e' lateral:   11.00 cm/s LVOT diam:     2.20 cm   LV E/e' lateral: 10.4 LV SV:         67 LV SV Index:   39 LVOT Area:     3.80 cm  RIGHT VENTRICLE            IVC RV Basal diam:  4.20 cm    IVC diam: 2.10 cm RV S prime:     7.62 cm/s TAPSE (M-mode): 1.5 cm LEFT ATRIUM              Index        RIGHT ATRIUM           Index LA diam:        4.30 cm  2.49 cm/m   RA Area:     33.60 cm LA Vol (A2C):   127.0 ml 73.43 ml/m  RA Volume:   117.00 ml 67.65 ml/m LA Vol (A4C):   111.0 ml 64.18 ml/m LA Biplane Vol: 120.0 ml 69.38 ml/m  AORTIC VALVE AV Area (Vmax):    2.64 cm AV Area (Vmean):   2.23 cm AV Area (VTI):     2.50 cm AV Vmax:           188.00 cm/s AV Vmean:          132.000 cm/s AV VTI:            0.269 m AV Peak Grad:      14.1 mmHg AV Mean Grad:      8.0 mmHg LVOT Vmax:         130.71 cm/s LVOT Vmean:        77.391 cm/s LVOT VTI:          0.177 m LVOT/AV VTI ratio: 0.66 AR Vena Contracta: 0.40  cm  AORTA Ao Root diam: 2.70 cm MITRAL VALVE                TRICUSPID VALVE MV Area (PHT): 3.94 cm     TR Peak grad:   35.3 mmHg MV Decel Time: 193 msec     TR Vmax:        297.00 cm/s MV E velocity: 114.67 cm/s                             SHUNTS                             Systemic VTI:  0.18 m  Systemic Diam: 2.20 cm Carlyle Dolly MD Electronically signed by Carlyle Dolly MD Signature Date/Time: 03/16/2022/6:55:53 PM    Final    CT ABDOMEN PELVIS W CONTRAST  Result Date: 03/16/2022 CLINICAL DATA:  Abdominal pain. EXAM: CT ABDOMEN AND PELVIS WITH CONTRAST TECHNIQUE: Multidetector CT imaging of the abdomen and pelvis was performed using the standard protocol following bolus administration of intravenous contrast. RADIATION DOSE REDUCTION: This exam was performed according to the departmental dose-optimization program which includes automated exposure control, adjustment of the mA and/or kV according to patient size and/or use of iterative reconstruction technique. CONTRAST:  25m OMNIPAQUE IOHEXOL 350 MG/ML SOLN COMPARISON:  None Available. FINDINGS: Evaluation of this exam is limited due to respiratory motion as well as loose stool streak artifact caused by patient's arms. Lower chest: There are bibasilar interstitial coarsening. There is moderate cardiomegaly. Three vessel coronary vascular calcification. No intra-abdominal free air for free fluid. Hepatobiliary: Several small liver cysts. The liver is otherwise unremarkable. No biliary ductal dilatation. The gallbladder is unremarkable. Pancreas: The pancreas is unremarkable as visualized. Spleen: Normal in size without focal abnormality. Adrenals/Urinary Tract: The adrenal glands unremarkable. There is no hydronephrosis on either side. There is symmetric enhancement and excretion of contrast by both kidneys. The visualized ureters and urinary bladder appear unremarkable. Stomach/Bowel: There is moderate stool throughout the  colon. There are scattered sigmoid diverticula without active inflammatory changes. There is no bowel obstruction or active inflammation. The appendix is not visualized with certainty. No inflammatory changes identified in the right lower quadrant. Vascular/Lymphatic: The aorta is tortuous. There is advanced aortoiliac atherosclerotic disease. An infrarenal IVC filter is noted. No portal venous gas. There is no adenopathy. Reproductive: The uterus is not visualized, likely surgically absent. There is a 3.2 cm right ovarian cyst. Soft tissue associated with the cyst likely represent the ovarian tissue. This can be better evaluated with ultrasound on a nonemergent/outpatient basis. The left ovary is not identified with certainty. Other: None Musculoskeletal: Osteopenia with degenerative changes of the spine. Bilateral femoral neck ORIF. No acute osseous pathology. IMPRESSION: 1. No acute intra-abdominal or pelvic pathology. 2. Constipation.  No bowel obstruction. 3. Sigmoid diverticulosis. 4. A 3.2 cm right ovarian cyst. This can be better evaluated with ultrasound on a nonemergent/outpatient basis. 5.  Aortic Atherosclerosis (ICD10-I70.0). Electronically Signed   By: AAnner CreteM.D.   On: 03/16/2022 01:17   MR BRAIN WO CONTRAST  Result Date: 03/16/2022 CLINICAL DATA:  Neuro deficit, stroke suspected EXAM: MRI HEAD WITHOUT CONTRAST TECHNIQUE: Multiplanar, multiecho pulse sequences of the brain and surrounding structures were obtained without intravenous contrast. COMPARISON:  No prior MRI available, correlation is made with CT head 03/15/2022 FINDINGS: Brain: Restricted diffusion with ADC correlate in the left posterior frontal cortex, including the precentral gyrus (series 5, images 82-87), consistent with acute infarcts. This areas not associated with significantly increased T2 hyperintense signal. Additional smaller foci of mildly hyperintense signal on diffusion-weighted imaging without definite ADC  correlate in the more anterior left frontal lobe may represent more subacute infarcts (series 5, images 82-83). No acute hemorrhage, mass, mass effect, or midline shift. No hydrocephalus or extra-axial collection. Encephalomalacia in the right greater than left frontal lobes, consistent with remote infarcts. Age related cerebral atrophy, with slightly more pronounced atrophy in the bilateral medial temporal lobes. T2 hyperintense signal in the periventricular white matter, likely the sequela of moderate chronic small vessel ischemic disease. Vascular: Normal arterial flow voids. Skull and upper cervical spine: Normal marrow signal. Sinuses/Orbits: Clear paranasal  sinuses. Status post bilateral lens replacements. Other: Fluid in the mastoid air cells. IMPRESSION: Acute infarcts in the left posterior frontal cortex, including the precentral gyrus. Additional smaller subacute infarcts in the more anterior left frontal lobe. Imaging results were communicated on 03/16/2022 at 12:43 am to provider Dr. Rory Percy via secure text paging. Electronically Signed   By: Merilyn Baba M.D.   On: 03/16/2022 00:47   DG Chest Port 1 View  Result Date: 03/15/2022 CLINICAL DATA:  Screening EXAM: PORTABLE CHEST 1 VIEW COMPARISON:  Radiographs 01/16/2022 FINDINGS: No change from 01/16/2022. Airspace opacity in the right lower lung may represent chronic middle lobe collapse as there is slight rightward deviation of the mediastinum. Cardiomegaly. Aortic atherosclerotic calcification. Chronic bronchitic change. No displaced rib fractures. IMPRESSION: No change from 01/16/2022. Airspace opacity in the right lower lung may represent chronic middle lobe collapse as there is slight rightward deviation of the mediastinum. Electronically Signed   By: Placido Sou M.D.   On: 03/15/2022 23:54   CT ANGIO HEAD NECK W WO CM (CODE STROKE)  Result Date: 03/15/2022 CLINICAL DATA:  Stroke suspected EXAM: CT ANGIOGRAPHY HEAD AND NECK TECHNIQUE:  Multidetector CT imaging of the head and neck was performed using the standard protocol during bolus administration of intravenous contrast. Multiplanar CT image reconstructions and MIPs were obtained to evaluate the vascular anatomy. Carotid stenosis measurements (when applicable) are obtained utilizing NASCET criteria, using the distal internal carotid diameter as the denominator. RADIATION DOSE REDUCTION: This exam was performed according to the departmental dose-optimization program which includes automated exposure control, adjustment of the mA and/or kV according to patient size and/or use of iterative reconstruction technique. CONTRAST:  94m OMNIPAQUE IOHEXOL 350 MG/ML SOLN COMPARISON:  No prior CTA available, correlation is made with CT head 03/15/2022 FINDINGS: CT HEAD FINDINGS For noncontrast findings, please see same day CT head. CTA NECK FINDINGS Aortic arch: Incompletely imaged, but standard aortic branching is suspected. Imaged portion shows no evidence of aneurysm or dissection. No significant stenosis of the major arch vessel origins. Right carotid system: No evidence of dissection, occlusion, or hemodynamically significant stenosis (greater than 50%). Atherosclerotic disease at the bifurcation and in the proximal ICA is not hemodynamically significant. Tortuous distal ICAs. Left carotid system: No evidence of dissection, occlusion, or hemodynamically significant stenosis (greater than 50%). Atherosclerotic disease at the bifurcation and in the proximal ICA is not hemodynamically significant. Vertebral arteries: No evidence of dissection, occlusion, or hemodynamically significant stenosis (greater than 50%). The left vertebral artery is diminutive throughout its course. Skeleton: No acute osseous abnormality. Severe degenerative changes in the cervical spine, with reversal of the normal cervical lordosis. Other neck: Negative. Upper chest: No focal pulmonary opacity or pleural effusion. Review of  the MIP images confirms the above findings CTA HEAD FINDINGS Anterior circulation: Both internal carotid arteries are patent to the termini, with mild stenosis in the bilateral supraclinoid segments. Patent right A1. Severely hypoplastic left A1. Normal anterior communicating artery. Severe stenosis in the left A2 segment (series 7, images 70-72). Anterior cerebral arteries are otherwise patent to their distal aspects. No M1 stenosis or occlusion. MCA branches perfused and symmetric. Posterior circulation: The left vertebral artery terminates in PICA. The right vertebral artery is patent to the vertebrobasilar junction without stenosis. Posterior inferior cerebellar arteries patent proximally. Basilar patent to its distal aspect. Superior cerebellar arteries patent proximally. Patent P1 segments. PCAs perfused to their distal aspects without stenosis. The bilateral posterior communicating arteries are not visualized. Venous sinuses: As permitted  by contrast timing, patent. Anatomic variants: None significant. Review of the MIP images confirms the above findings IMPRESSION: 1. No intracranial large vessel occlusion. Severe stenosis in the left A2 segment. 2. No hemodynamically significant stenosis in the neck. Imaging results were communicated on 03/15/2022 at 11:26 pm to provider Dr. Rory Percy via secure text paging. Electronically Signed   By: Merilyn Baba M.D.   On: 03/15/2022 23:27   CT HEAD CODE STROKE WO CONTRAST  Result Date: 03/15/2022 CLINICAL DATA:  Code stroke. EXAM: CT HEAD WITHOUT CONTRAST TECHNIQUE: Contiguous axial images were obtained from the base of the skull through the vertex without intravenous contrast. RADIATION DOSE REDUCTION: This exam was performed according to the departmental dose-optimization program which includes automated exposure control, adjustment of the mA and/or kV according to patient size and/or use of iterative reconstruction technique. COMPARISON:  03/22/2020 FINDINGS: Brain: No  evidence of acute infarction, hemorrhage, mass, mass effect, or midline shift. No hydrocephalus or extra-axial collection. Redemonstrated right greater than left frontal cortical infarcts. Periventricular white matter changes, likely the sequela of chronic small vessel ischemic disease. Vascular: No hyperdense vessel. Atherosclerotic calcifications in the intracranial carotid and vertebral arteries. Skull: Negative for fracture or focal lesion. Sinuses/Orbits: No acute finding. Status post bilateral lens replacements. Other: The mastoid air cells are well aerated. ASPECTS [facility] Stroke Program Early CT Score) - Ganglionic level infarction (caudate, lentiform nuclei, internal capsule, insula, M1-M3 cortex): 7 - Supraganglionic infarction (M4-M6 cortex): 3 Total score (0-10 with 10 being normal): 10 IMPRESSION: 1. No acute intracranial process. 2. ASPECTS is 10. Imaging results were communicated on 03/15/2022 at 10:30 pm to provider Dr. Rory Percy via secure text paging. Electronically Signed   By: Merilyn Baba M.D.   On: 03/15/2022 22:30        Scheduled Meds:   stroke: early stages of recovery book   Does not apply Once   apixaban  5 mg Oral BID   atorvastatin  80 mg Oral Daily   diltiazem  90 mg Oral Q6H   memantine  10 mg Oral BID   Continuous Infusions:  sodium chloride 50 mL/hr at 03/16/22 0341   cefTRIAXone (ROCEPHIN)  IV 2 g (03/17/22 0843)     LOS: 1 day    Time spent: 52 minutes spent on chart review, discussion with nursing staff, consultants, updating family and interview/physical exam; more than 50% of that time was spent in counseling and/or coordination of care.    Gianni Fuchs J [place] DO [facility] Available via Epic secure chat 7am-7pm After these hours, please refer to coverage provider listed on [email or web address] 03/17/2022, 1:56 PM

## 2022-03-17 NOTE — Progress Notes (Signed)
STROKE TEAM PROGRESS NOTE   INTERVAL HISTORY Her granddaughters are at the bedside with her daughter on facetime.  She has baseline dementia and poor cognition and they feel that she is back to baseline, she continues to have right-sided weakness.  Family understands that patient may need to go for short stay in rehab before she can go home and they are agreeable.  Blood pressure adequately controlled.  Vitals:   03/16/22 2200 03/17/22 0000 03/17/22 0400 03/17/22 0800  BP: (!) 152/90 (!) 158/79 (!) 158/66   Pulse: 96 91 (!) 102   Resp: (!) 22 16 (!) 25   Temp:  98 F (36.7 C) 98.4 F (36.9 C) 98.5 F (36.9 C)  TempSrc:  Oral Axillary Oral  SpO2: 92% 91% 90%   Weight:      Height:       CBC:  Recent Labs  Lab 03/15/22 2220 03/15/22 2228 03/16/22 0450  WBC 5.5  --  4.4  NEUTROABS 2.9  --   --   HGB 12.6 12.9 12.8  HCT 37.8 38.0 38.2  MCV 100.0  --  98.2  PLT 210  --  [id number]   Basic Metabolic Panel:  Recent Labs  Lab 03/15/22 2220 03/15/22 2228 03/16/22 0450  NA 130* 130* 136  K 4.9 4.9 4.1  CL 95* 97* 101  CO2 23  --  25  GLUCOSE 92 92 109*  BUN '17 22 15  '$ CREATININE 0.81 0.90 0.75  CALCIUM 9.3  --  8.8*  MG  --   --  2.2  PHOS  --   --  3.7   Lipid Panel:  Recent Labs  Lab 03/16/22 0144  CHOL 252*  TRIG 68  HDL 84  CHOLHDL 3.0  VLDL 14  LDLCALC 154*   HgbA1c:  Recent Labs  Lab 03/16/22 0144  HGBA1C 5.6   Urine Drug Screen: No results for input(s): "LABOPIA", "COCAINSCRNUR", "LABBENZ", "AMPHETMU", "THCU", "LABBARB" in the last 168 hours.  Alcohol Level  Recent Labs  Lab 03/15/22 2220  ETH <10    IMAGING past 24 hours ECHOCARDIOGRAM COMPLETE BUBBLE STUDY  Result Date: 03/16/2022    ECHOCARDIOGRAM REPORT   Patient Name:   Bridget [name] Date of Exam: 03/16/2022 Medical Rec #:  [id number]    Height:       67.0 in Accession #:    [id number]   Weight:       138.4 lb Date of Birth:  [birth year]    BSA:          1.730 m Patient Age:    89 years     BP:            159/83 mmHg Patient Gender: F            HR:           102 bpm. Exam Location:  Inpatient Procedure: 2D Echo, Cardiac Doppler and Color Doppler Indications:    Stroke 434.91 / I63.9  History:        Patient has prior history of Echocardiogram examinations, most                 recent 09/20/2020. Stroke; Risk Factors:Former Smoker.  Sonographer:    Wilkie Aye RVT RCS Referring Phys: [id number] [place]  1. Left ventricular ejection fraction, by estimation, is 55 to 60%. The left ventricle has normal function. The left ventricle has no regional wall motion abnormalities. Left ventricular diastolic parameters are indeterminate.  2. Right ventricular systolic function is normal. The right ventricular size is mildly enlarged. There is mildly elevated pulmonary artery systolic pressure.  3. Left atrial size was severely dilated.  4. Right atrial size was severely dilated.  5. The mitral valve is abnormal. Mild mitral valve regurgitation. No evidence of mitral stenosis.  6. The tricuspid valve is abnormal. Tricuspid valve regurgitation is moderate to severe.  7. The aortic valve was not well visualized. There is severe calcifcation of the aortic valve. There is severe thickening of the aortic valve. Aortic valve regurgitation is moderate. No aortic stenosis is present.  8. Agitated saline contrast bubble study was negative, with no evidence of any interatrial shunt. FINDINGS  Left Ventricle: Left ventricular ejection fraction, by estimation, is 55 to 60%. The left ventricle has normal function. The left ventricle has no regional wall motion abnormalities. The left ventricular internal cavity size was normal in size. There is  no left ventricular hypertrophy. Left ventricular diastolic parameters are indeterminate. Right Ventricle: The right ventricular size is mildly enlarged. Right vetricular wall thickness was not well visualized. Right ventricular systolic function is normal. There is mildly elevated  pulmonary artery systolic pressure. The tricuspid regurgitant  velocity is 2.97 m/s, and with an assumed right atrial pressure of 3 mmHg, the estimated right ventricular systolic pressure is 0000000 mmHg. Left Atrium: Left atrial size was severely dilated. Right Atrium: Right atrial size was severely dilated. Pericardium: There is no evidence of pericardial effusion. Mitral Valve: The mitral valve is abnormal. Mild mitral valve regurgitation. No evidence of mitral valve stenosis. Tricuspid Valve: The tricuspid valve is abnormal. Tricuspid valve regurgitation is moderate to severe. No evidence of tricuspid stenosis. Aortic Valve: The aortic valve was not well visualized. There is severe calcifcation of the aortic valve. There is severe thickening of the aortic valve. There is severe aortic valve annular calcification. Aortic valve regurgitation is moderate. No aortic stenosis is present. Aortic valve mean gradient measures 8.0 mmHg. Aortic valve peak gradient measures 14.1 mmHg. Aortic valve area, by VTI measures 2.50 cm. Pulmonic Valve: The pulmonic valve was not well visualized. Pulmonic valve regurgitation is not visualized. No evidence of pulmonic stenosis. Aorta: The aortic root is normal in size and structure. IAS/Shunts: No atrial level shunt detected by color flow Doppler. Agitated saline contrast bubble study was negative, with no evidence of any interatrial shunt.  LEFT VENTRICLE PLAX 2D LVIDd:         4.30 cm   Diastology LVIDs:         3.90 cm   LV e' medial:    6.50 cm/s LV PW:         1.05 cm   LV E/e' medial:  17.6 LV IVS:        1.05 cm   LV e' lateral:   11.00 cm/s LVOT diam:     2.20 cm   LV E/e' lateral: 10.4 LV SV:         67 LV SV Index:   39 LVOT Area:     3.80 cm  RIGHT VENTRICLE            IVC RV Basal diam:  4.20 cm    IVC diam: 2.10 cm RV S prime:     7.62 cm/s TAPSE (M-mode): 1.5 cm LEFT ATRIUM              Index        RIGHT ATRIUM  Index LA diam:        4.30 cm  2.49 cm/m   RA  Area:     33.60 cm LA Vol (A2C):   127.0 ml 73.43 ml/m  RA Volume:   117.00 ml 67.65 ml/m LA Vol (A4C):   111.0 ml 64.18 ml/m LA Biplane Vol: 120.0 ml 69.38 ml/m  AORTIC VALVE AV Area (Vmax):    2.64 cm AV Area (Vmean):   2.23 cm AV Area (VTI):     2.50 cm AV Vmax:           188.00 cm/s AV Vmean:          132.000 cm/s AV VTI:            0.269 m AV Peak Grad:      14.1 mmHg AV Mean Grad:      8.0 mmHg LVOT Vmax:         130.71 cm/s LVOT Vmean:        77.391 cm/s LVOT VTI:          0.177 m LVOT/AV VTI ratio: 0.66 AR Vena Contracta: 0.40 cm  AORTA Ao Root diam: 2.70 cm MITRAL VALVE                TRICUSPID VALVE MV Area (PHT): 3.94 cm     TR Peak grad:   35.3 mmHg MV Decel Time: 193 msec     TR Vmax:        297.00 cm/s MV E velocity: 114.67 cm/s                             SHUNTS                             Systemic VTI:  0.18 m                             Systemic Diam: 2.20 cm Carlyle Dolly MD Electronically signed by Carlyle Dolly MD Signature Date/Time: 03/16/2022/6:55:53 PM    Final     PHYSICAL EXAM Awake alert oriented to self and birth date. Unable to tell me the current month or location. Does not recognize family members without prompting.  Able to follow simple commands. Able to name simple objects. Poor historian for medical care.  Able to repeat sentences. No dysarthria Eyes are midline, PERRL, no facial droop, facial sensation is intact. shoulder strength equal, tongue protrudes midline Elevates upper extremities antigravity but right less than left. Easily distracted by monitoring cords during exam. Elevates bilateral lower extremities antigravity, right appears weaker than left. Sensation intact to light touch Slight dysmetria in right upper extremity   ASSESSMENT/PLAN Bridget [name] is a 89 y.o. 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 She  lives with her daughter who is her full time caregiver.   Stroke:  acute infarcts in the left precentral cyrus and left posterior frontal lobe Etiology:  likely embolic in the setting of atrial fibrillation on eliquis   Code Stroke CT head No acute abnormality. ASPECTS 10.    CTA head & neck No intracranial large vessel occlusion. Severe stenosis in the left A2 segment. 2. No hemodynamically significant stenosis in the neck. MRI  Acute infarcts in the left  posterior frontal cortex, including the precentral gyrus. Additional smaller subacute infarcts in the more anterior left frontal lobe. 2D Echo EF 55 to 60%.  Left atrial size is severely dilated.   LDL 154 HgbA1c 5.6 VTE prophylaxis - lovenox    Diet   Diet regular Room service appropriate? Yes; Fluid consistency: Thin   Eliquis (apixaban) daily prior to admission, now on Eliquis (apixaban) daily. Family is hesitant to change anticoagulation. She has tolerated elquis in the setting of afib and her known LA thrombus Therapy recommendations:  AIR Currently receiving therapy three times per week Disposition:  Pending- from   Atrial Fibrillation LA thrombus Home meds: Eliquis 2.'5mg'$  BID, diltiazem  Hypertension Stable Allow for permissive hypertension for the first 24-48h - only treat PRN if SBP >220 mmHg. Blood pressures can be gradually normalized to SBP<140 upon discharge.   Hyperlipidemia LDL 154, goal < 70 Add Atorvastatin '80mg'$  Continue statin at discharge  Diabetes type II Controlled Home meds:  none HgbA1c pending., goal < 7.0 CBGs Recent Labs    03/15/22 2216  GLUCAP 109*    SSI  Other Stroke Risk Factors Advanced Age >/= 69   Other Active Problems Urinary Tract Infection UA with large leukocytes, positive nitrates  Hospital day # 1   I have personally obtained history,examined this patient, reviewed notes, independently viewed imaging studies, participated in medical decision making and plan of care.ROS  completed by me personally and pertinent positives fully documented  I have made any additions or clarifications directly to the above note. Agree with note above.  Patient presented with right sided weakness due to left frontal MCA branch infarct from A-fib despite being on anticoagulation with Eliquis.  I discussed with patient and family lack of definitive data suggesting switching Eliquis to Xarelto or Pradaxa is necessarily better.  Alternative options would be considering Watchman device or participation for strokes trial but unfortunately given her significant dementia and poor baseline functioning she is not a candidate for either.  Recommend continue Eliquis and maintain aggressive risk factor modification.  Continue ongoing therapies.  She will likely need to go for rehab for a short time before she can go home.  Family voiced understanding.  Greater than 50% time during this 35-minute with the placement of counseling and coordination of care and discussion patient and family and answering questions.  Stroke team will sign off.  Kindly call for questions Antony Contras, MD Medical Director [place] Pager: [phone] 03/17/2022 11:46 AM   To contact Stroke Continuity provider, please refer to [email or web address] After hours, contact General Neurology

## 2022-03-17 NOTE — Progress Notes (Signed)
PHARMACIST - PHYSICIAN ORDER COMMUNICATION  CONCERNING: P&T Medication Policy on Herbal Medications  DESCRIPTION:  This patient's order for:  CBD gummy and CBD +melatonin gummy  has been noted.  This product(s) is classified as an "herbal" or natural product. Due to a lack of definitive safety studies or FDA approval, nonstandard manufacturing practices, plus the potential risk of unknown drug-drug interactions while on inpatient medications, the Pharmacy and Therapeutics Committee does not permit the use of "herbal" or natural products of this type within [facility].   ACTION TAKEN: The pharmacy department is unable to verify this order at this time and your patient has been informed of this safety policy. Please reevaluate patient's clinical condition at discharge and address if the herbal or natural product(s) should be resumed at that time.  After discussion with Dr. [place] plain melatonin '3mg'$  qhs will be ordered.  Sherlon Handing, PharmD, BCPS Please see amion for complete clinical pharmacist phone list 03/17/2022 4:47 PM

## 2022-03-17 NOTE — Progress Notes (Signed)
Progress Note  Patient Name: Bridget [name] MRN: [id number] DOB: 07-19-1921 Date of Encounter: 03/17/2022  Attending physician: [place] Eric J, DO Primary care provider: Haywood Pao, MD Primary Cardiologist: Rex Kras, DO, [facility]  Subjective: Bridget [name] is a 89 y.o. Caucasian female who was seen and examined at bedside  Awake and alert. Pleasantly confused secondary to baseline dementia. Son present at bedside Case discussed and reviewed with her nurse.  Objective: Vital Signs in the last 24 hours: Temp:  [98 F (36.7 C)-98.5 F (36.9 C)] 98.5 F (36.9 C) (03/11 0800) Pulse Rate:  [91-113] 102 (03/11 0400) Resp:  [16-25] 25 (03/11 0400) BP: (140-178)/(66-90) 158/66 (03/11 0400) SpO2:  [90 %-95 %] 90 % (03/11 0400)  Intake/Output:  Intake/Output Summary (Last 24 hours) at 03/17/2022 1308 Last data filed at 03/17/2022 0600 Gross per 24 hour  Intake 515.94 ml  Output --  Net 515.94 ml    Net IO Since Admission: 1,115.94 mL [03/17/22 1308]  Weights:     03/15/2022   10:00 PM 01/27/2022   11:11 AM 01/16/2022    3:05 PM  Last 3 Weights  Weight (lbs) 138 lb 7.2 oz 131 lb 131 lb  Weight (kg) 62.8 kg 59.421 kg 59.421 kg      Telemetry:  Overnight telemetry shows atrial fibrillation-ventricular rate better controlled, which I personally reviewed.   Physical examination: PHYSICAL EXAM: Vitals:   03/16/22 2200 03/17/22 0000 03/17/22 0400 03/17/22 0800  BP: (!) 152/90 (!) 158/79 (!) 158/66   Pulse: 96 91 (!) 102   Resp: (!) 22 16 (!) 25   Temp:  98 F (36.7 C) 98.4 F (36.9 C) 98.5 F (36.9 C)  TempSrc:  Oral Axillary Oral  SpO2: 92% 91% 90%   Weight:      Height:        Physical Exam  Constitutional: No distress. She appears chronically ill.  hemodynamically stable.   Neck: No JVD present.  Cardiovascular: Normal rate, S1 normal, S2 normal, intact distal pulses and normal pulses. An irregularly irregular rhythm present. Exam reveals no gallop, no S3 and  no S4.  No murmur heard. Pulmonary/Chest: Effort normal and breath sounds normal. No stridor. She has no wheezes. She has no rales.  Abdominal: Soft. Bowel sounds are normal. She exhibits no distension. There is no abdominal tenderness.  Musculoskeletal:        General: No edema.     Cervical back: Neck supple.  Neurological: She is alert.  Moves all 4 extremities against gravity. Strength right hand 2 out of 5, strength left hand 4 out of 5, strength bilateral lower extremities 4 out of 5   Skin: Skin is warm and moist.    Lab Results: Chemistry Recent Labs  Lab 03/15/22 2220 03/15/22 2228 03/16/22 0450  NA 130* 130* 136  K 4.9 4.9 4.1  CL 95* 97* 101  CO2 23  --  25  GLUCOSE 92 92 109*  BUN '17 22 15  '$ CREATININE 0.81 0.90 0.75  CALCIUM 9.3  --  8.8*  PROT 6.1*  --   --   ALBUMIN 3.4*  --   --   AST 21  --   --   ALT 13  --   --   ALKPHOS 55  --   --   BILITOT 0.8  --   --   GFRNONAA >60  --  >60  ANIONGAP 12  --  10    Hematology Recent Labs  Lab  03/15/22 2220 03/15/22 2228 03/16/22 0450  WBC 5.5  --  4.4  RBC 3.78*  --  3.89  HGB 12.6 12.9 12.8  HCT 37.8 38.0 38.2  MCV 100.0  --  98.2  MCH 33.3  --  32.9  MCHC 33.3  --  33.5  RDW 14.1  --  14.3  PLT 210  --  200   High Sensitivity Troponin:  No results for input(s): "TROPONINIHS" in the last 720 hours.   Cardiac EnzymesNo results for input(s): "TROPONINI" in the last 168 hours. No results for input(s): "TROPIPOC" in the last 168 hours.  BNPNo results for input(s): "BNP", "PROBNP" in the last 168 hours.  DDimer No results for input(s): "DDIMER" in the last 168 hours.  Hemoglobin A1c:  Lab Results  Component Value Date   HGBA1C 5.6 03/16/2022   MPG 114 03/16/2022   TSH No results for input(s): "TSH" in the last 8760 hours. Lipid Panel  Lab Results  Component Value Date   CHOL 252 (H) 03/16/2022   HDL 84 03/16/2022   LDLCALC 154 (H) 03/16/2022   TRIG 68 03/16/2022   CHOLHDL 3.0 03/16/2022    Drugs of Abuse  No results found for: "LABOPIA", "COCAINSCRNUR", "LABBENZ", "AMPHETMU", "THCU", "LABBARB"    Imaging: ECHOCARDIOGRAM COMPLETE BUBBLE STUDY  Result Date: 03/16/2022    ECHOCARDIOGRAM REPORT   Patient Name:   Bridget [name] Date of Exam: 03/16/2022 Medical Rec #:  [id number]    Height:       67.0 in Accession #:    [id number]   Weight:       138.4 lb Date of Birth:  October 05, 1921    BSA:          1.730 m Patient Age:    61 years     BP:           159/83 mmHg Patient Gender: F            HR:           102 bpm. Exam Location:  Inpatient Procedure: 2D Echo, Cardiac Doppler and Color Doppler Indications:    Stroke 434.91 / I63.9  History:        Patient has prior history of Echocardiogram examinations, most                 recent 09/20/2020. Stroke; Risk Factors:Former Smoker.  Sonographer:    Wilkie Aye RVT RCS Referring Phys: [id number] [place]  1. Left ventricular ejection fraction, by estimation, is 55 to 60%. The left ventricle has normal function. The left ventricle has no regional wall motion abnormalities. Left ventricular diastolic parameters are indeterminate.  2. Right ventricular systolic function is normal. The right ventricular size is mildly enlarged. There is mildly elevated pulmonary artery systolic pressure.  3. Left atrial size was severely dilated.  4. Right atrial size was severely dilated.  5. The mitral valve is abnormal. Mild mitral valve regurgitation. No evidence of mitral stenosis.  6. The tricuspid valve is abnormal. Tricuspid valve regurgitation is moderate to severe.  7. The aortic valve was not well visualized. There is severe calcifcation of the aortic valve. There is severe thickening of the aortic valve. Aortic valve regurgitation is moderate. No aortic stenosis is present.  8. Agitated saline contrast bubble study was negative, with no evidence of any interatrial shunt. FINDINGS  Left Ventricle: Left ventricular ejection fraction, by estimation, is 55  to 60%. The left ventricle has normal function. The left  ventricle has no regional wall motion abnormalities. The left ventricular internal cavity size was normal in size. There is  no left ventricular hypertrophy. Left ventricular diastolic parameters are indeterminate. Right Ventricle: The right ventricular size is mildly enlarged. Right vetricular wall thickness was not well visualized. Right ventricular systolic function is normal. There is mildly elevated pulmonary artery systolic pressure. The tricuspid regurgitant  velocity is 2.97 m/s, and with an assumed right atrial pressure of 3 mmHg, the estimated right ventricular systolic pressure is 0000000 mmHg. Left Atrium: Left atrial size was severely dilated. Right Atrium: Right atrial size was severely dilated. Pericardium: There is no evidence of pericardial effusion. Mitral Valve: The mitral valve is abnormal. Mild mitral valve regurgitation. No evidence of mitral valve stenosis. Tricuspid Valve: The tricuspid valve is abnormal. Tricuspid valve regurgitation is moderate to severe. No evidence of tricuspid stenosis. Aortic Valve: The aortic valve was not well visualized. There is severe calcifcation of the aortic valve. There is severe thickening of the aortic valve. There is severe aortic valve annular calcification. Aortic valve regurgitation is moderate. No aortic stenosis is present. Aortic valve mean gradient measures 8.0 mmHg. Aortic valve peak gradient measures 14.1 mmHg. Aortic valve area, by VTI measures 2.50 cm. Pulmonic Valve: The pulmonic valve was not well visualized. Pulmonic valve regurgitation is not visualized. No evidence of pulmonic stenosis. Aorta: The aortic root is normal in size and structure. IAS/Shunts: No atrial level shunt detected by color flow Doppler. Agitated saline contrast bubble study was negative, with no evidence of any interatrial shunt.  LEFT VENTRICLE PLAX 2D LVIDd:         4.30 cm   Diastology LVIDs:         3.90 cm   LV e'  medial:    6.50 cm/s LV PW:         1.05 cm   LV E/e' medial:  17.6 LV IVS:        1.05 cm   LV e' lateral:   11.00 cm/s LVOT diam:     2.20 cm   LV E/e' lateral: 10.4 LV SV:         67 LV SV Index:   39 LVOT Area:     3.80 cm  RIGHT VENTRICLE            IVC RV Basal diam:  4.20 cm    IVC diam: 2.10 cm RV S prime:     7.62 cm/s TAPSE (M-mode): 1.5 cm LEFT ATRIUM              Index        RIGHT ATRIUM           Index LA diam:        4.30 cm  2.49 cm/m   RA Area:     33.60 cm LA Vol (A2C):   127.0 ml 73.43 ml/m  RA Volume:   117.00 ml 67.65 ml/m LA Vol (A4C):   111.0 ml 64.18 ml/m LA Biplane Vol: 120.0 ml 69.38 ml/m  AORTIC VALVE AV Area (Vmax):    2.64 cm AV Area (Vmean):   2.23 cm AV Area (VTI):     2.50 cm AV Vmax:           188.00 cm/s AV Vmean:          132.000 cm/s AV VTI:            0.269 m AV Peak Grad:      14.1 mmHg AV  Mean Grad:      8.0 mmHg LVOT Vmax:         130.71 cm/s LVOT Vmean:        77.391 cm/s LVOT VTI:          0.177 m LVOT/AV VTI ratio: 0.66 AR Vena Contracta: 0.40 cm  AORTA Ao Root diam: 2.70 cm MITRAL VALVE                TRICUSPID VALVE MV Area (PHT): 3.94 cm     TR Peak grad:   35.3 mmHg MV Decel Time: 193 msec     TR Vmax:        297.00 cm/s MV E velocity: 114.67 cm/s                             SHUNTS                             Systemic VTI:  0.18 m                             Systemic Diam: 2.20 cm Carlyle Dolly MD Electronically signed by Carlyle Dolly MD Signature Date/Time: 03/16/2022/6:55:53 PM    Final    CT ABDOMEN PELVIS W CONTRAST  Result Date: 03/16/2022 CLINICAL DATA:  Abdominal pain. EXAM: CT ABDOMEN AND PELVIS WITH CONTRAST TECHNIQUE: Multidetector CT imaging of the abdomen and pelvis was performed using the standard protocol following bolus administration of intravenous contrast. RADIATION DOSE REDUCTION: This exam was performed according to the departmental dose-optimization program which includes automated exposure control, adjustment of the mA and/or kV  according to patient size and/or use of iterative reconstruction technique. CONTRAST:  75m OMNIPAQUE IOHEXOL 350 MG/ML SOLN COMPARISON:  None Available. FINDINGS: Evaluation of this exam is limited due to respiratory motion as well as loose stool streak artifact caused by patient's arms. Lower chest: There are bibasilar interstitial coarsening. There is moderate cardiomegaly. Three vessel coronary vascular calcification. No intra-abdominal free air for free fluid. Hepatobiliary: Several small liver cysts. The liver is otherwise unremarkable. No biliary ductal dilatation. The gallbladder is unremarkable. Pancreas: The pancreas is unremarkable as visualized. Spleen: Normal in size without focal abnormality. Adrenals/Urinary Tract: The adrenal glands unremarkable. There is no hydronephrosis on either side. There is symmetric enhancement and excretion of contrast by both kidneys. The visualized ureters and urinary bladder appear unremarkable. Stomach/Bowel: There is moderate stool throughout the colon. There are scattered sigmoid diverticula without active inflammatory changes. There is no bowel obstruction or active inflammation. The appendix is not visualized with certainty. No inflammatory changes identified in the right lower quadrant. Vascular/Lymphatic: The aorta is tortuous. There is advanced aortoiliac atherosclerotic disease. An infrarenal IVC filter is noted. No portal venous gas. There is no adenopathy. Reproductive: The uterus is not visualized, likely surgically absent. There is a 3.2 cm right ovarian cyst. Soft tissue associated with the cyst likely represent the ovarian tissue. This can be better evaluated with ultrasound on a nonemergent/outpatient basis. The left ovary is not identified with certainty. Other: None Musculoskeletal: Osteopenia with degenerative changes of the spine. Bilateral femoral neck ORIF. No acute osseous pathology. IMPRESSION: 1. No acute intra-abdominal or pelvic pathology. 2.  Constipation.  No bowel obstruction. 3. Sigmoid diverticulosis. 4. A 3.2 cm right ovarian cyst. This can be better evaluated with ultrasound on a nonemergent/outpatient basis.  5.  Aortic Atherosclerosis (ICD10-I70.0). Electronically Signed   By: Anner Crete M.D.   On: 03/16/2022 01:17   MR BRAIN WO CONTRAST  Result Date: 03/16/2022 CLINICAL DATA:  Neuro deficit, stroke suspected EXAM: MRI HEAD WITHOUT CONTRAST TECHNIQUE: Multiplanar, multiecho pulse sequences of the brain and surrounding structures were obtained without intravenous contrast. COMPARISON:  No prior MRI available, correlation is made with CT head 03/15/2022 FINDINGS: Brain: Restricted diffusion with ADC correlate in the left posterior frontal cortex, including the precentral gyrus (series 5, images 82-87), consistent with acute infarcts. This areas not associated with significantly increased T2 hyperintense signal. Additional smaller foci of mildly hyperintense signal on diffusion-weighted imaging without definite ADC correlate in the more anterior left frontal lobe may represent more subacute infarcts (series 5, images 82-83). No acute hemorrhage, mass, mass effect, or midline shift. No hydrocephalus or extra-axial collection. Encephalomalacia in the right greater than left frontal lobes, consistent with remote infarcts. Age related cerebral atrophy, with slightly more pronounced atrophy in the bilateral medial temporal lobes. T2 hyperintense signal in the periventricular white matter, likely the sequela of moderate chronic small vessel ischemic disease. Vascular: Normal arterial flow voids. Skull and upper cervical spine: Normal marrow signal. Sinuses/Orbits: Clear paranasal sinuses. Status post bilateral lens replacements. Other: Fluid in the mastoid air cells. IMPRESSION: Acute infarcts in the left posterior frontal cortex, including the precentral gyrus. Additional smaller subacute infarcts in the more anterior left frontal lobe. Imaging  results were communicated on 03/16/2022 at 12:43 am to provider Dr. Rory Percy via secure text paging. Electronically Signed   By: Merilyn Baba M.D.   On: 03/16/2022 00:47   DG Chest Port 1 View  Result Date: 03/15/2022 CLINICAL DATA:  Screening EXAM: PORTABLE CHEST 1 VIEW COMPARISON:  Radiographs 01/16/2022 FINDINGS: No change from 01/16/2022. Airspace opacity in the right lower lung may represent chronic middle lobe collapse as there is slight rightward deviation of the mediastinum. Cardiomegaly. Aortic atherosclerotic calcification. Chronic bronchitic change. No displaced rib fractures. IMPRESSION: No change from 01/16/2022. Airspace opacity in the right lower lung may represent chronic middle lobe collapse as there is slight rightward deviation of the mediastinum. Electronically Signed   By: Placido Sou M.D.   On: 03/15/2022 23:54   CT ANGIO HEAD NECK W WO CM (CODE STROKE)  Result Date: 03/15/2022 CLINICAL DATA:  Stroke suspected EXAM: CT ANGIOGRAPHY HEAD AND NECK TECHNIQUE: Multidetector CT imaging of the head and neck was performed using the standard protocol during bolus administration of intravenous contrast. Multiplanar CT image reconstructions and MIPs were obtained to evaluate the vascular anatomy. Carotid stenosis measurements (when applicable) are obtained utilizing NASCET criteria, using the distal internal carotid diameter as the denominator. RADIATION DOSE REDUCTION: This exam was performed according to the departmental dose-optimization program which includes automated exposure control, adjustment of the mA and/or kV according to patient size and/or use of iterative reconstruction technique. CONTRAST:  69m OMNIPAQUE IOHEXOL 350 MG/ML SOLN COMPARISON:  No prior CTA available, correlation is made with CT head 03/15/2022 FINDINGS: CT HEAD FINDINGS For noncontrast findings, please see same day CT head. CTA NECK FINDINGS Aortic arch: Incompletely imaged, but standard aortic branching is suspected.  Imaged portion shows no evidence of aneurysm or dissection. No significant stenosis of the major arch vessel origins. Right carotid system: No evidence of dissection, occlusion, or hemodynamically significant stenosis (greater than 50%). Atherosclerotic disease at the bifurcation and in the proximal ICA is not hemodynamically significant. Tortuous distal ICAs. Left carotid system: No evidence of  dissection, occlusion, or hemodynamically significant stenosis (greater than 50%). Atherosclerotic disease at the bifurcation and in the proximal ICA is not hemodynamically significant. Vertebral arteries: No evidence of dissection, occlusion, or hemodynamically significant stenosis (greater than 50%). The left vertebral artery is diminutive throughout its course. Skeleton: No acute osseous abnormality. Severe degenerative changes in the cervical spine, with reversal of the normal cervical lordosis. Other neck: Negative. Upper chest: No focal pulmonary opacity or pleural effusion. Review of the MIP images confirms the above findings CTA HEAD FINDINGS Anterior circulation: Both internal carotid arteries are patent to the termini, with mild stenosis in the bilateral supraclinoid segments. Patent right A1. Severely hypoplastic left A1. Normal anterior communicating artery. Severe stenosis in the left A2 segment (series 7, images 70-72). Anterior cerebral arteries are otherwise patent to their distal aspects. No M1 stenosis or occlusion. MCA branches perfused and symmetric. Posterior circulation: The left vertebral artery terminates in PICA. The right vertebral artery is patent to the vertebrobasilar junction without stenosis. Posterior inferior cerebellar arteries patent proximally. Basilar patent to its distal aspect. Superior cerebellar arteries patent proximally. Patent P1 segments. PCAs perfused to their distal aspects without stenosis. The bilateral posterior communicating arteries are not visualized. Venous sinuses: As  permitted by contrast timing, patent. Anatomic variants: None significant. Review of the MIP images confirms the above findings IMPRESSION: 1. No intracranial large vessel occlusion. Severe stenosis in the left A2 segment. 2. No hemodynamically significant stenosis in the neck. Imaging results were communicated on 03/15/2022 at 11:26 pm to provider Dr. Rory Percy via secure text paging. Electronically Signed   By: Merilyn Baba M.D.   On: 03/15/2022 23:27   CT HEAD CODE STROKE WO CONTRAST  Result Date: 03/15/2022 CLINICAL DATA:  Code stroke. EXAM: CT HEAD WITHOUT CONTRAST TECHNIQUE: Contiguous axial images were obtained from the base of the skull through the vertex without intravenous contrast. RADIATION DOSE REDUCTION: This exam was performed according to the departmental dose-optimization program which includes automated exposure control, adjustment of the mA and/or kV according to patient size and/or use of iterative reconstruction technique. COMPARISON:  03/22/2020 FINDINGS: Brain: No evidence of acute infarction, hemorrhage, mass, mass effect, or midline shift. No hydrocephalus or extra-axial collection. Redemonstrated right greater than left frontal cortical infarcts. Periventricular white matter changes, likely the sequela of chronic small vessel ischemic disease. Vascular: No hyperdense vessel. Atherosclerotic calcifications in the intracranial carotid and vertebral arteries. Skull: Negative for fracture or focal lesion. Sinuses/Orbits: No acute finding. Status post bilateral lens replacements. Other: The mastoid air cells are well aerated. ASPECTS [facility] Stroke Program Early CT Score) - Ganglionic level infarction (caudate, lentiform nuclei, internal capsule, insula, M1-M3 cortex): 7 - Supraganglionic infarction (M4-M6 cortex): 3 Total score (0-10 with 10 being normal): 10 IMPRESSION: 1. No acute intracranial process. 2. ASPECTS is 10. Imaging results were communicated on 03/15/2022 at 10:30 pm to provider Dr.  Rory Percy via secure text paging. Electronically Signed   By: Merilyn Baba M.D.   On: 03/15/2022 22:30    CARDIAC DATABASE: RADIOLOGY: CT chest with contrast: 08/17/2020 IMPRESSION: 1. Scattered areas of bronchiectasis are noted in the lungs bilaterally with associated chronic scarring/atelectasis in the right middle lobe. Notably, there also 2 large pulmonary nodules in the posterior aspect of the left lower lobe. These are favored to be of infectious or inflammatory etiology, but underlying neoplasm is difficult to entirely exclude. Accordingly, short-term repeat chest CT is recommended 1-2 months to ensure the stability or regression of these findings. 2. Cardiomegaly with biatrial  dilatation. Notably, there is a large filling defect in the tip of the left atrial appendage highly concerning for left atrial appendage thrombus. If present, this places the patient at high risk for systemic embolization. Further evaluation with transesophageal echocardiography is recommended if clinically appropriate. 3. Trace left pleural effusion lying dependently. 4. Aortic atherosclerosis, in addition to left main and 3 vessel coronary artery disease. 5. There are calcifications of the aortic valve and mitral annulus. Echocardiographic correlation for evaluation of potential valvular dysfunction may be warranted if clinically indicated.   CT chest without contrast 12/21/2020: 1. Pulmonary nodules in the posterior left lower lobe have resolved since 08/17/2020 and compatible with resolved infection or inflammation. However, there are multiple new areas of airspace disease throughout both lungs, most prominent in the upper lobes. Findings are most compatible with infectious or inflammatory changes. In addition, there are scattered areas of bronchiectasis with areas of mucoid impaction. 2. Chronic collapse of the right middle lobe. 3. Cardiomegaly. 4.  Aortic Atherosclerosis (ICD10-I70.0).   MRI of the brain without  contrast: 03/15/2022 Acute infarcts in the left posterior frontal cortex, including the precentral gyrus.   Additional smaller subacute infarcts in the more anterior left frontal lobe.   CARDIAC DATABASE: EKG: 12/10/2021: Atrial fibrillation, 85 bpm, right bundle branch block. 03/15/2022: Atrial fibrillation, 74 bpm, right bundle branch block.   Echocardiogram: 03/16/2022:  1. Left ventricular ejection fraction, by estimation, is 55 to 60%. The  left ventricle has normal function. The left ventricle has no regional  wall motion abnormalities. Left ventricular diastolic parameters are  indeterminate.   2. Right ventricular systolic function is normal. The right ventricular  size is mildly enlarged. There is mildly elevated pulmonary artery  systolic pressure.   3. Left atrial size was severely dilated.   4. Right atrial size was severely dilated.   5. The mitral valve is abnormal. Mild mitral valve regurgitation. No  evidence of mitral stenosis.   6. The tricuspid valve is abnormal. Tricuspid valve regurgitation is  moderate to severe.   7. The aortic valve was not well visualized. There is severe calcification  of the aortic valve. There is severe thickening of the aortic valve.  Aortic valve regurgitation is moderate. No aortic stenosis is present.   8. Agitated saline contrast bubble study was negative, with no evidence  of any interatrial shunt.   Scheduled Meds:   stroke: early stages of recovery book   Does not apply Once   apixaban  5 mg Oral BID   atorvastatin  80 mg Oral Daily   diltiazem  90 mg Oral Q6H   memantine  10 mg Oral BID    Continuous Infusions:  sodium chloride 50 mL/hr at 03/16/22 0341   cefTRIAXone (ROCEPHIN)  IV 2 g (03/17/22 0843)    PRN Meds: acetaminophen, albuterol, labetalol, melatonin, polyethylene glycol, prochlorperazine   IMPRESSION & RECOMMENDATIONS: Bridget [name] is a 89 y.o. Caucasian female whose past medical history and cardiac risk  factors include:  Atrial fibrillation, hx of stroke (2017), severe coronary calcification (1809, 94th percentile, CT pulmonary vein protocol June 2023), history of breast cancer, osteoporosis bilateral hip fractures, dementia (per daughter), thrombus of the left atrial appendage (CT scan ), pulmonary nodule, advanced age, post menopausal female.   Impression:  Atrial fibrillation with rapid ventricular rate.  Known history of permanent A-fib Long-term anticoagulation Acute stroke (Left posterior frontal cortex, including the precentral gyrus & smaller subacute infarcts in the more anterior left frontal lobe). History  of left atrial appendage filling defect (artifact versus thrombus) Coronary artery calcification. Aortic atherosclerosis. Former smoker   Plan:  Atrial fibrillation with rapid ventricular rate Known history of permanent atrial fibrillation Long-term oral anticoagulation Rate control: Diltiazem. Rhythm control: N/A. Thromboembolic prophylaxis: Eliquis. Ventricular rate has improved on Cardizem drip. Patient has received her morning dose of Cardizem 180 mg p.o. daily. Discontinue Cardizem drip. Start Cardizem short acting 90 mg p.o. 3 times daily with holding parameters.  With plans of transitioning her to extended release at the time of discharge to improve compliance.  By controlling her ventricular rate she may have reduce incidence of A-fib with RVR. Due to dementia and underlying bronchiectasis want to hold off on antiarrhythmic medications. Family does not want to proceed with TEE guided cardioversion. Continue Eliquis 5 mg p.o. twice daily CHA2DS2-VASc SCORE is 7 which correlates to 11.2% risk of stroke per year (age, HTN, gender, CVA, Ao atherosclerosis).  Does not endorse evidence of bleeding.   Acute CVA:  (Left posterior frontal cortex, including the precentral gyrus & smaller subacute infarcts in the more anterior left frontal lobe). Second event. Multifactorial  but may be secondary to suboptimal anticoagulation dosing. During last office visit where her weight was less than 60 kg and given her age of 75 she was on Eliquis 2.5 mg p.o. twice daily.  However on arrival her weight is greater than 60 kg which entails a higher dose of Eliquis. Agree with statin therapy. 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 Refused TEE in the past to confirm the diagnoses. Her Pradaxa was changed to Eliquis.  Pure hyperlipidemia: Indexed LDL 154 mg/dL.  Started on statin therapy.  Patient's questions and concerns were addressed to her satisfaction. She voices understanding of the instructions provided during this encounter.   This note was created using a voice recognition software as a result there may be grammatical errors inadvertently enclosed that do not reflect the nature of this encounter. Every attempt is made to correct such errors.  Mechele Claude [facility]  Pager:  [phone] Office: [phone] 03/17/2022, 1:08 PM

## 2022-03-18 DIAGNOSIS — I639 Cerebral infarction, unspecified: Secondary | ICD-10-CM | POA: Diagnosis not present

## 2022-03-18 LAB — URINE CULTURE: Culture: >=100000 — AB

## 2022-03-18 NOTE — Progress Notes (Addendum)
Subjective:  Feels better  Underlying dementia   Current Facility-Administered Medications:     stroke: early stages of recovery book, , Does not apply, Once, Leonie Man, Pramod S, MD   acetaminophen (TYLENOL) tablet 650 mg, 650 mg, Oral, Q6H PRN, Kayleen Memos, DO, 650 mg at 03/17/22 2047   albuterol (PROVENTIL) (2.5 MG/3ML) 0.083% nebulizer solution 2.5 mg, 2.5 mg, Inhalation, Q6H PRN, [place] Crane, Carole N, DO   apixaban (ELIQUIS) tablet 5 mg, 5 mg, Oral, BID, Reeves Forth, Gaurang M, MD, 5 mg at 03/18/22 0849   atorvastatin (LIPITOR) tablet 80 mg, 80 mg, Oral, Daily, Hall, Carole N, DO, 80 mg at 03/18/22 0849   cefTRIAXone (ROCEPHIN) 2 g in sodium chloride 0.9 % 100 mL IVPB, 2 g, Intravenous, Q24H, [place] Eric J, DO, Last Rate: 200 mL/hr at 03/18/22 0854, 2 g at 03/18/22 0854   diltiazem (CARDIZEM CD) 24 hr capsule 360 mg, 360 mg, Oral, Daily, Tolia, Sunit, DO, 360 mg at 03/18/22 0849   labetalol (NORMODYNE) injection 5 mg, 5 mg, Intravenous, Q2H PRN, Hall, Carole N, DO   melatonin tablet 3 mg, 3 mg, Oral, QHS PRN, Hall, Carole N, DO   melatonin tablet 3 mg, 3 mg, Oral, QHS, [place] Eric J, DO, 3 mg at 03/17/22 2048   memantine (NAMENDA) tablet 10 mg, 10 mg, Oral, BID, Hall, Carole N, DO, 10 mg at 03/18/22 0849   mometasone-formoterol (DULERA) 200-5 MCG/ACT inhaler 2 puff, 2 puff, Inhalation, BID, 2 puff at 03/18/22 0852 **AND** umeclidinium bromide (INCRUSE ELLIPTA) 62.5 MCG/ACT 1 puff, 1 puff, Inhalation, Daily, [place] Eric J, DO   polyethylene glycol (MIRALAX / GLYCOLAX) packet 17 g, 17 g, Oral, Daily PRN, [place] Crane, Carole N, DO   prochlorperazine (COMPAZINE) injection 5 mg, 5 mg, Intravenous, Q6H PRN, [place] Crane, Carole N, DO   Objective:  Vital Signs in the last 24 hours: Temp:  [97.7 F (36.5 C)-98.5 F (36.9 C)] 98.5 F (36.9 C) (03/12 1600) Pulse Rate:  [64-71] 71 (03/12 0320) Resp:  [10-17] 15 (03/12 0320) BP: (125-131)/(57-63) 125/58 (03/12 0320) SpO2:  [90 %-94 %] 90 % (03/12  0852)  Intake/Output from previous day: 03/11 0701 - 03/12 0700 In: -  Out: 700 [Urine:700]  Physical Exam Vitals and nursing note reviewed.  Constitutional:      General: She is not in acute distress. Neck:     Vascular: No JVD.  Cardiovascular:     Rate and Rhythm: Normal rate. Rhythm irregular.     Heart sounds: Normal heart sounds. No murmur heard. Pulmonary:     Effort: Pulmonary effort is normal.     Breath sounds: Normal breath sounds. No wheezing or rales.  Neurological:     Motor: Weakness (Right sided) present.      Imaging/tests reviewed and independently interpreted:  Brain MRI 03/16/2022: Acute infarcts in the left posterior frontal cortex, including the precentral gyrus.   Additional smaller subacute infarcts in the more anterior left frontal lobe.   Imaging results were communicated on 03/16/2022 at 12:43 am to provider Dr. Rory Percy via secure text paging.   Cardiac Studies:  Telemetry 03/18/2022: Afib with controlled ventricular rate  EKG 03/15/2022: Afib RBBB  Echocardiogram 03/16/2022: 1. Left ventricular ejection fraction, by estimation, is 55 to 60%. The  left ventricle has normal function. The left ventricle has no regional  wall motion abnormalities. Left ventricular diastolic parameters are  indeterminate.   2. Right ventricular systolic function is normal. The right ventricular  size is mildly enlarged. There is mildly  elevated pulmonary artery  systolic pressure.   3. Left atrial size was severely dilated.   4. Right atrial size was severely dilated.   5. The mitral valve is abnormal. Mild mitral valve regurgitation. No  evidence of mitral stenosis.   6. The tricuspid valve is abnormal. Tricuspid valve regurgitation is  moderate to severe.   7. The aortic valve was not well visualized. There is severe calcifcation  of the aortic valve. There is severe thickening of the aortic valve.  Aortic valve regurgitation is moderate. No aortic  stenosis is present.   8. Agitated saline contrast bubble study was negative, with no evidence  of any interatrial shunt.    Assessment & Recommendations:  89 y/o Caucasian female with permanent Afib, h/o stroke, dementia, questionable LAA appendage thrombus, admitted with stroke  Stroke: Right sided deficit, improving. Management as per Neurology.  Permanent Afib: Rate well controlled on diltiazem 360 mg daily. CHA2DS2-VASc score 7, annual stroke risk 11%. Also, known prior LAA thrombus. Previously on Eliquis 2.5 mg bid,. Now Wt >60 Kg, changed to 5 mg bid.   Cardiology will sign off.  Please contact with any questions. Will arrange outpatient f/u  Discussed interpretation of tests and management recommendations with the primary team   Nigel Mormon, MD Pager: [phone] Office: [phone]

## 2022-03-18 NOTE — Progress Notes (Signed)
PROGRESS NOTE    Bridget [name]  [id number] DOB: Sep 06, 1921 DOA: 03/15/2022 PCP: Haywood Pao, MD    Brief Narrative:   Bridget [name] is a 89 y.o. female with past medical history significant for dementia, permanent atrial fibrillation, history of CVA, CAD, history of breast cancer, osteoporosis, history of thrombus left atrial appendage, pulmonary nodule who presented to [facility] ED on 3/9 via EMS from home with right-sided facial droop, slurred speech.  On arrival to the ED, code stroke was initiated.  Last known normal was 2130.  Patient was having a hard time formulating her sentences with garbled speech.  CT head with no acute intracranial process, CT angiogram head/neck with no emergent LVO.  Not a candidate for IV thrombolytics due to Eliquis use.  MR brain with acute CVA left precentral gyrus and posterior frontal lobe.  TRH consulted for admission for further evaluation management of acute CVA.  Assessment & Plan:   Acute CVA Patient presenting to the ED with right-sided facial droop, slurred speech.  CT head and CT head/neck with no acute process/no emergent LVO.  MRI brain with acute CVA with left precentral gyrus, posterior frontal lobe, anterior frontal lobe findings.  Seen by neurology, not a candidate for thrombolytics due to Eliquis use.  LDL 154, hemoglobin A1c 5.6.  TTE with LVEF 55-60%, LA severely dilated.  Neurology now signed off. -- Atorvastatin 80 mg p.o. daily -- Continue Eliquis 5 mg p.o. twice daily -- PT/OT recommending CIR, awaiting bed availability -- Outpatient follow-up with neurology  Permanent atrial fibrillation with RVR LAA thrombus At baseline on Eliquis 2.5 mg p.o. twice daily, diltiazem 180 mg p.o. daily. -- Cardiology following, appreciate assistance -- Cardizem CD 360 mg PO daily -- Eliquis 5 mg p.o. twice daily -- Continue monitor on telemetry  Proteus urinary tract infection -- Urine culture positive for > 100K Proteus -- Ceftriaxone 2 g IV  every 24 hours; plan 5-day course with end date 3/13  Essential hypertension Home regimen includes diltiazem 90 mg p.o. daily. -- Cardizem CD 360 mg p.o. daily  Hyperlipidemia LDL elevated 134 with goal less than 70.  Added atorvastatin 80 mg p.o. daily.  Diabetes mellitus Diet controlled at baseline.  Hemoglobin A1c 5.6.   DVT prophylaxis:  apixaban (ELIQUIS) tablet 5 mg    Code Status: DNR Family Communication: Updated daughter present at bedside this morning.    Disposition Plan:  Level of care: Telemetry Medical Status is: Inpatient Remains inpatient appropriate because: Medically stable for discharge once CIR bed available    Consultants:  Neurology -signed off 3/11 Cardiology, Dr. Terri Skains  Procedures:  TTE  Antimicrobials:  Ceftriaxone   Subjective: Patient seen examined bedside, resting comfortably.  Lying in bed.  Pleasantly confused.  Daughter present.  Daughter reports that she slept well overnight.  Awaiting CIR bed, no bed available today.  Will complete IV antibiotics for Proteus UTI tomorrow.  No other specific questions or concerns at this time.  Unable to obtain any further ROS from patient due to her advanced dementia.  Objective: Vitals:   03/18/22 0000 03/18/22 0320 03/18/22 0800 03/18/22 0852  BP: 131/63 (!) 125/58    Pulse: 64 71    Resp: 10 15    Temp: 97.9 F (36.6 C) 97.8 F (36.6 C) 98 F (36.7 C)   TempSrc: Oral Oral Oral   SpO2: 90% 92%  90%  Weight:      Height:        Intake/Output Summary (  Last 24 hours) at 03/18/2022 1015 Last data filed at 03/18/2022 0324 Gross per 24 hour  Intake --  Output 700 ml  Net -700 ml   Filed Weights   03/15/22 2200  Weight: 62.8 kg    Examination:  Physical Exam: GEN: NAD, alert, pleasantly confused, elderly in appearance HEENT: NCAT, PERRL, EOMI, sclera clear, MMM PULM: CTAB w/o wheezes/crackles, normal respiratory effort CV: IRR, normal rate, w/o M/G/R GI: abd soft, NTND, NABS MSK: no  peripheral edema, muscle strength asymmetric right weaker than left NEURO: No facial droop, facial sensation intact, tongue midline, PERRL, EOMI, right upper/lower extremity weakness in comparison to left, distracted PSYCH: Pleasantly confused Integumentary: No concerning rashes/lesions/wounds noted on exposed skin surfaces.    Data Reviewed: I have personally reviewed following labs and imaging studies  CBC: Recent Labs  Lab 03/15/22 2220 03/15/22 2228 03/16/22 0450  WBC 5.5  --  4.4  NEUTROABS 2.9  --   --   HGB 12.6 12.9 12.8  HCT 37.8 38.0 38.2  MCV 100.0  --  98.2  PLT 210  --  [id number]   Basic Metabolic Panel: Recent Labs  Lab 03/15/22 2220 03/15/22 2228 03/16/22 0450  NA 130* 130* 136  K 4.9 4.9 4.1  CL 95* 97* 101  CO2 23  --  25  GLUCOSE 92 92 109*  BUN '17 22 15  '$ CREATININE 0.81 0.90 0.75  CALCIUM 9.3  --  8.8*  MG  --   --  2.2  PHOS  --   --  3.7   GFR: Estimated Creatinine Clearance: 38.2 mL/min (by C-G formula based on SCr of 0.75 mg/dL). Liver Function Tests: Recent Labs  Lab 03/15/22 2220  AST 21  ALT 13  ALKPHOS 55  BILITOT 0.8  PROT 6.1*  ALBUMIN 3.4*   No results for input(s): "LIPASE", "AMYLASE" in the last 168 hours. No results for input(s): "AMMONIA" in the last 168 hours. Coagulation Profile: Recent Labs  Lab 03/15/22 2220  INR 1.1   Cardiac Enzymes: No results for input(s): "CKTOTAL", "CKMB", "CKMBINDEX", "TROPONINI" in the last 168 hours. BNP (last 3 results) No results for input(s): "PROBNP" in the last 8760 hours. HbA1C: Recent Labs    03/16/22 0144  HGBA1C 5.6   CBG: Recent Labs  Lab 03/15/22 2216  GLUCAP 109*   Lipid Profile: Recent Labs    03/16/22 0144  CHOL 252*  HDL 84  LDLCALC 154*  TRIG 68  CHOLHDL 3.0   Thyroid Function Tests: No results for input(s): "TSH", "T4TOTAL", "FREET4", "T3FREE", "THYROIDAB" in the last 72 hours. Anemia Panel: No results for input(s): "VITAMINB12", "FOLATE", "FERRITIN",  "TIBC", "IRON", "RETICCTPCT" in the last 72 hours. Sepsis Labs: No results for input(s): "PROCALCITON", "LATICACIDVEN" in the last 168 hours.  Recent Results (from the past 240 hour(s))  Urine Culture (for pregnant, neutropenic or urologic patients or patients with an indwelling urinary catheter)     Status: Abnormal   Collection Time: 03/15/22 11:00 PM   Specimen: Urine, Clean Catch  Result Value Ref Range Status   Specimen Description URINE, CLEAN CATCH  Final   Special Requests   Final    NONE Performed at [facility], [place] [address] [place], [place] [address]    Culture >=100,000 COLONIES/mL PROTEUS MIRABILIS (A)  Final   Report Status 03/18/2022 FINAL  Final   Organism ID, Bacteria PROTEUS MIRABILIS (A)  Final      Susceptibility   Proteus mirabilis - MIC*  AMPICILLIN <=2 SENSITIVE Sensitive     CEFAZOLIN <=4 SENSITIVE Sensitive     CEFEPIME <=0.12 SENSITIVE Sensitive     CEFTRIAXONE <=0.25 SENSITIVE Sensitive     CIPROFLOXACIN <=0.25 SENSITIVE Sensitive     GENTAMICIN <=1 SENSITIVE Sensitive     IMIPENEM 2 SENSITIVE Sensitive     NITROFURANTOIN 256 RESISTANT Resistant     TRIMETH/SULFA <=20 SENSITIVE Sensitive     AMPICILLIN/SULBACTAM <=2 SENSITIVE Sensitive     PIP/TAZO <=4 SENSITIVE Sensitive     * >=100,000 COLONIES/mL PROTEUS MIRABILIS         Radiology Studies: ECHOCARDIOGRAM COMPLETE BUBBLE STUDY  Result Date: 03/16/2022    ECHOCARDIOGRAM REPORT   Patient Name:   Bridget [name] Date of Exam: 03/16/2022 Medical Rec #:  [id number]    Height:       67.0 in Accession #:    [id number]   Weight:       138.4 lb Date of Birth:  12-15-1921    BSA:          1.730 m Patient Age:    49 years     BP:           159/83 mmHg Patient Gender: F            HR:           102 bpm. Exam Location:  Inpatient Procedure: 2D Echo, Cardiac Doppler and Color Doppler Indications:    Stroke 434.91 / I63.9  History:        Patient has prior history of Echocardiogram examinations, most                  recent 09/20/2020. Stroke; Risk Factors:Former Smoker.  Sonographer:    Wilkie Aye RVT RCS Referring Phys: [id number] [place]  1. Left ventricular ejection fraction, by estimation, is 55 to 60%. The left ventricle has normal function. The left ventricle has no regional wall motion abnormalities. Left ventricular diastolic parameters are indeterminate.  2. Right ventricular systolic function is normal. The right ventricular size is mildly enlarged. There is mildly elevated pulmonary artery systolic pressure.  3. Left atrial size was severely dilated.  4. Right atrial size was severely dilated.  5. The mitral valve is abnormal. Mild mitral valve regurgitation. No evidence of mitral stenosis.  6. The tricuspid valve is abnormal. Tricuspid valve regurgitation is moderate to severe.  7. The aortic valve was not well visualized. There is severe calcifcation of the aortic valve. There is severe thickening of the aortic valve. Aortic valve regurgitation is moderate. No aortic stenosis is present.  8. Agitated saline contrast bubble study was negative, with no evidence of any interatrial shunt. FINDINGS  Left Ventricle: Left ventricular ejection fraction, by estimation, is 55 to 60%. The left ventricle has normal function. The left ventricle has no regional wall motion abnormalities. The left ventricular internal cavity size was normal in size. There is  no left ventricular hypertrophy. Left ventricular diastolic parameters are indeterminate. Right Ventricle: The right ventricular size is mildly enlarged. Right vetricular wall thickness was not well visualized. Right ventricular systolic function is normal. There is mildly elevated pulmonary artery systolic pressure. The tricuspid regurgitant  velocity is 2.97 m/s, and with an assumed right atrial pressure of 3 mmHg, the estimated right ventricular systolic pressure is 0000000 mmHg. Left Atrium: Left atrial size was severely dilated. Right Atrium:  Right atrial size was severely dilated. Pericardium: There is no evidence of pericardial effusion. Mitral Valve:  The mitral valve is abnormal. Mild mitral valve regurgitation. No evidence of mitral valve stenosis. Tricuspid Valve: The tricuspid valve is abnormal. Tricuspid valve regurgitation is moderate to severe. No evidence of tricuspid stenosis. Aortic Valve: The aortic valve was not well visualized. There is severe calcifcation of the aortic valve. There is severe thickening of the aortic valve. There is severe aortic valve annular calcification. Aortic valve regurgitation is moderate. No aortic stenosis is present. Aortic valve mean gradient measures 8.0 mmHg. Aortic valve peak gradient measures 14.1 mmHg. Aortic valve area, by VTI measures 2.50 cm. Pulmonic Valve: The pulmonic valve was not well visualized. Pulmonic valve regurgitation is not visualized. No evidence of pulmonic stenosis. Aorta: The aortic root is normal in size and structure. IAS/Shunts: No atrial level shunt detected by color flow Doppler. Agitated saline contrast bubble study was negative, with no evidence of any interatrial shunt.  LEFT VENTRICLE PLAX 2D LVIDd:         4.30 cm   Diastology LVIDs:         3.90 cm   LV e' medial:    6.50 cm/s LV PW:         1.05 cm   LV E/e' medial:  17.6 LV IVS:        1.05 cm   LV e' lateral:   11.00 cm/s LVOT diam:     2.20 cm   LV E/e' lateral: 10.4 LV SV:         67 LV SV Index:   39 LVOT Area:     3.80 cm  RIGHT VENTRICLE            IVC RV Basal diam:  4.20 cm    IVC diam: 2.10 cm RV S prime:     7.62 cm/s TAPSE (M-mode): 1.5 cm LEFT ATRIUM              Index        RIGHT ATRIUM           Index LA diam:        4.30 cm  2.49 cm/m   RA Area:     33.60 cm LA Vol (A2C):   127.0 ml 73.43 ml/m  RA Volume:   117.00 ml 67.65 ml/m LA Vol (A4C):   111.0 ml 64.18 ml/m LA Biplane Vol: 120.0 ml 69.38 ml/m  AORTIC VALVE AV Area (Vmax):    2.64 cm AV Area (Vmean):   2.23 cm AV Area (VTI):     2.50 cm AV  Vmax:           188.00 cm/s AV Vmean:          132.000 cm/s AV VTI:            0.269 m AV Peak Grad:      14.1 mmHg AV Mean Grad:      8.0 mmHg LVOT Vmax:         130.71 cm/s LVOT Vmean:        77.391 cm/s LVOT VTI:          0.177 m LVOT/AV VTI ratio: 0.66 AR Vena Contracta: 0.40 cm  AORTA Ao Root diam: 2.70 cm MITRAL VALVE                TRICUSPID VALVE MV Area (PHT): 3.94 cm     TR Peak grad:   35.3 mmHg MV Decel Time: 193 msec     TR Vmax:  297.00 cm/s MV E velocity: 114.67 cm/s                             SHUNTS                             Systemic VTI:  0.18 m                             Systemic Diam: 2.20 cm Carlyle Dolly MD Electronically signed by Carlyle Dolly MD Signature Date/Time: 03/16/2022/6:55:53 PM    Final         Scheduled Meds:   stroke: early stages of recovery book   Does not apply Once   apixaban  5 mg Oral BID   atorvastatin  80 mg Oral Daily   diltiazem  360 mg Oral Daily   melatonin  3 mg Oral QHS   memantine  10 mg Oral BID   mometasone-formoterol  2 puff Inhalation BID   And   umeclidinium bromide  1 puff Inhalation Daily   Continuous Infusions:  cefTRIAXone (ROCEPHIN)  IV 2 g (03/18/22 0854)     LOS: 2 days    Time spent: 48 minutes spent on chart review, discussion with nursing staff, consultants, updating family and interview/physical exam; more than 50% of that time was spent in counseling and/or coordination of care.    Kimori Tartaglia J [place] DO [facility] Available via Epic secure chat 7am-7pm After these hours, please refer to coverage provider listed on [email or web address] 03/18/2022, 10:15 AM

## 2022-03-18 NOTE — Progress Notes (Signed)
Physical Therapy Treatment Patient Details Name: Bridget [name] MRN: [id number] DOB: Jul 17, 1921 Today's Date: 03/18/2022   History of Present Illness Bridget [name] is a 89 y.o. female who presents 03/15/2022 who presented to [facility] ED from home as a code stroke with right facial drooling and dysarthria. Acute infarcts in the left posterior frontal cortex, including the  precentral gyrus.  UA was positive for pyuri. Medical history significant for dementia requiring 24/7 assistance, permanent atrial fibrillation on Eliquis, thrombus of atrial appendage, prior history of stroke with no residual deficits, history of breast cancer status post lumpectomy, osteoporosis and bronchiectasis.    PT Comments    Pt with improved cognition and participation in today's session. Pt able to stand at EOB with maxAx2 and UE support, blocking of B knees required, with verbal and tactile facilitation provided for upright posture with hip extension. Pt requiring maxA x2 to transfer to chair today with second person to facilitate pivot of hips. Encouraged upright sitting posture with use of chair in front of pt for UE support. Intermittent verbal cues for neutral trunk position to decrease posterior lean as well as forward gaze with shoulder retraction. Pt progressing during session with mild use of core, but remaining heavily reliant on LUE support. Acute PT will continue to follow up with pt as appropriate to progress mobility, discharge recommendations remain appropriate.    Recommendations for follow up therapy are one component of a multi-disciplinary discharge planning process, led by the attending physician.  Recommendations may be updated based on patient status, additional functional criteria and insurance authorization.  Follow Up Recommendations  Acute inpatient rehab (3hours/day)     Assistance Recommended at Discharge Frequent or constant Supervision/Assistance  Patient can return home with the following Two  people to help with walking and/or transfers;A lot of help with bathing/dressing/bathroom;Assist for transportation;Help with stairs or ramp for entrance   Equipment Recommendations  Other (comment) (defer to next level of care)    Recommendations for Other Services Rehab consult     Precautions / Restrictions Precautions Precautions: Fall Precaution Comments: watch HR Restrictions Weight Bearing Restrictions: No     Mobility  Bed Mobility Overal bed mobility: Needs Assistance Bed Mobility: Supine to Sit     Supine to sit: HOB elevated, Mod assist     General bed mobility comments: pt with initiation of BLE and trunk but assist required for BLE management off the bed as well as trunk support and pivoting hips    Transfers Overall transfer level: Needs assistance Equipment used: 2 person hand held assist, 1 person hand held assist Transfers: Sit to/from Stand, Bed to chair/wheelchair/BSC Sit to Stand: Max assist, +2 physical assistance, +2 safety/equipment     Squat pivot transfers: Max assist, +2 physical assistance     General transfer comment: 2 stands today with [facility] and blocking of B knees, pt standing with hip and trunk flexion, cues and faciliation provided for upright posture but unable to fully obtain. Attempted stand turn pivot but B knees buckling. Able to perform squat pivot with 1 person assisting anteriorly and second person assist for faciliation of hips    Ambulation/Gait               General Gait Details: unable at this time   Stairs             Wheelchair Mobility    Modified Rankin (Stroke Patients Only) Modified Rankin (Stroke Patients Only) Pre-Morbid Rankin Score: Moderately severe disability Modified Rankin: Severe  disability     Balance Overall balance assessment: Needs assistance Sitting-balance support: Feet supported, Bilateral upper extremity supported, Single extremity supported Sitting balance-Leahy Scale:  Poor Sitting balance - Comments: started with sitting balance and utilizing back of chair for UE support with faciliation through RUE, progressing to use of LUE only on the chair, highly reliant on UE to maintain neutral trunk position, cued for trunk activation   Standing balance support: Single extremity supported, During functional activity Standing balance-Leahy Scale: Poor Standing balance comment: requiring assist to maintain static standing with blocking of knees, maxAx2 needed for transfer                            Cognition Arousal/Alertness: Awake/alert Behavior During Therapy: WFL for tasks assessed/performed Overall Cognitive Status: History of cognitive impairments - at baseline                                 General Comments: Daughter at bedside, reporting better night of sleep. Pt able to state her name and reports her daughter is her mother, which the daughter says is normal for her to say. Pt less agitated and more participatory and pleasant today        Exercises      General Comments General comments (skin integrity, edema, etc.): VSS throughout, SPO2 in low 90s on room air, daughter at bedside throughout      Pertinent Vitals/Pain Pain Assessment Pain Assessment: No/denies pain    Home Living                          Prior Function            PT Goals (current goals can now be found in the care plan section) Acute Rehab PT Goals Patient Stated Goal: get better PT Goal Formulation: With patient Time For Goal Achievement: 03/30/22 Potential to Achieve Goals: Good Progress towards PT goals: Progressing toward goals    Frequency    Min 4X/week      PT Plan Current plan remains appropriate    Co-evaluation              AM-PAC PT "6 Clicks" Mobility   Outcome Measure  Help needed turning from your back to your side while in a flat bed without using bedrails?: A Lot Help needed moving from lying on  your back to sitting on the side of a flat bed without using bedrails?: A Lot Help needed moving to and from a bed to a chair (including a wheelchair)?: Total Help needed standing up from a chair using your arms (e.g., wheelchair or bedside chair)?: Total Help needed to walk in hospital room?: Total Help needed climbing 3-5 steps with a railing? : Total 6 Click Score: 8    End of Session Equipment Utilized During Treatment: Gait belt Activity Tolerance: Patient tolerated treatment well Patient left: in chair;with call bell/phone within reach;with chair alarm set;with family/visitor present Nurse Communication: Mobility status PT Visit Diagnosis: Unsteadiness on feet (R26.81);Hemiplegia and hemiparesis Hemiplegia - Right/Left: Right Hemiplegia - dominant/non-dominant: Dominant Hemiplegia - caused by: Cerebral infarction     Time: 1030-1104 PT Time Calculation (min) (ACUTE ONLY): 34 min  Charges:  $Therapeutic Activity: 8-22 mins $Neuromuscular Re-education: 8-22 mins  Charlynne Cousins, PT DPT Acute Rehabilitation Services Office [phone]    Luvenia Heller 03/18/2022, 3:31 PM

## 2022-03-18 NOTE — H&P (Signed)
Physical Medicine and Rehabilitation Admission H&P    Chief Complaint  Patient presents with   Code Stroke  : HPI: Bridget [name] is a 89 year old right-handed female with history of dementia maintained on Namenda requiring 24/7 assistance, diet-controlled diabetes mellitus, atrial fibrillation maintained on Eliquis with thrombus of atrial appendage, prior history of CVA with no residual deficits, history of breast cancer with lumpectomy.  Per chart review patient lives with daughter and granddaughter with 24-hour assistance as needed.  Used a rollator at baseline for limited household ambulation.  1 level home with 3 steps to entry.  Presented 03/15/2022 with right-sided facial droop and slurred speech of acute onset.  CT/MRI showed acute infarct in the left posterior frontal cortex including the precentral gyrus.  Additional smaller subacute infarcts in the more anterior left frontal lobe.  Patient did not receive tPA and maintained on Eliquis.  CT angiogram head and neck with no intracranial large vessel occlusion.  No hemodynamically significant stenosis in the neck.  CT of the abdomen/pelvis due to abdominal pain showed no acute intra-abdominal pathology no bowel obstruction.  Admission chemistries unremarkable except sodium 130, chloride 95, urine culture greater 100,000 Proteus Mirabalis.  Echocardiogram ejection fraction of 55 to 60% no wall motion abnormalities.  Neurology follow-up patient remains on Eliquis as prior to admission.  Rocephin was added for UTI.  Tolerating a regular consistency diet.  Therapy evaluations completed due to patient decreased functional mobility/dysarthria was admitted for a comprehensive rehab program.  Review of Systems  Constitutional:  Negative for chills and fever.  HENT:  Positive for hearing loss.   Eyes:  Negative for blurred vision and double vision.  Respiratory:  Negative for cough, shortness of breath and wheezing.   Cardiovascular:  Positive for  palpitations. Negative for chest pain and leg swelling.  Gastrointestinal:  Positive for constipation. Negative for heartburn, nausea and vomiting.  Genitourinary:  Negative for dysuria, flank pain and hematuria.  Musculoskeletal:  Positive for joint pain and myalgias.  Skin:  Negative for rash.  Neurological:  Positive for speech change and weakness.  Psychiatric/Behavioral:  Positive for memory loss. The patient has insomnia.   All other systems reviewed and are negative.  Past Medical History:  Diagnosis Date   Atherosclerosis of aorta (HCC)    Atrial fibrillation ([place])    Breast cancer ([place])    Chronic Bronchitis    Coronary atherosclerosis due to calcified coronary lesion    Nocturnal polyuria    Stroke (HCC)    Thrombus of left atrial appendage    Past Surgical History:  Procedure Laterality Date   BREAST LUMPECTOMY Left 2001   FEMUR FRACTURE SURGERY Right 2019   RIGHT - 2015   TOTAL ABDOMINAL HYSTERECTOMY  1967   Family History  Problem Relation Age of Onset   Other Sister        TRAUMA TO HEAD AFTER A FALL   Social History:  reports that she quit smoking about 74 years ago. Her smoking use included cigarettes. She has a 10.50 pack-year smoking history. She has never used smokeless tobacco. She reports that she does not drink alcohol and does not use drugs. Allergies:  Allergies  Allergen Reactions   Pulmicort [Budesonide] Swelling    Eye swelling, ankle and knee swelling   Erythromycin Other (See Comments)    Unknown, but "all the mycins bother me"   Roxicodone [Oxycodone] Other (See Comments)    Confusion    Ultram [Tramadol] Other (See Comments)  Confusion    Medications Prior to Admission  Medication Sig Dispense Refill   apixaban (ELIQUIS) 2.5 MG TABS tablet TAKE 1 TABLET BY MOUTH TWICE A DAY 180 tablet 1   Budeson-Glycopyrrol-Formoterol (BREZTRI AEROSPHERE) 160-9-4.8 MCG/ACT AERO Inhale 2 puffs into the lungs in the morning and at bedtime. 10.7 g 5    diltiazem (TIAZAC) 180 MG 24 hr capsule Take 180 mg by mouth daily.     memantine (NAMENDA) 10 MG tablet Take 1 tablet by mouth 2 (two) times daily.     nitrofurantoin (MACRODANTIN) 50 MG capsule Take 50 mg by mouth at bedtime.     OVER THE COUNTER MEDICATION Take 1 each by mouth in the morning. CBD gummy     OVER THE COUNTER MEDICATION Take 1 each by mouth at bedtime. CBD + melatonin gummy     acetaminophen (TYLENOL) 500 MG tablet Take 1,000 mg by mouth daily as needed for mild pain or headache. (Patient not taking: Reported on 03/16/2022)     albuterol (VENTOLIN HFA) 108 (90 Base) MCG/ACT inhaler Inhale 2 puffs into the lungs every 6 (six) hours as needed for shortness of breath or wheezing. (Patient not taking: Reported on 03/16/2022)     B Complex Vitamins (B COMPLEX PO) Take 1 capsule by mouth daily.     benzonatate (TESSALON) 200 MG capsule Take 1 capsule (200 mg total) by mouth 3 (three) times daily as needed. (Patient not taking: Reported on 01/27/2022) 45 capsule 1   Besifloxacin HCl (BESIVANCE) 0.6 % SUSP Place 1 drop into both eyes See admin instructions. Instill 1 drop into each eye 4 times daily for 2 days after eye injections     D-Mannose Paul Dykes) POWD Take 1 Scoop by mouth daily in the afternoon. (Patient not taking: Reported on 03/16/2022)     D-MANNOSE PO Take 2 tablets by mouth 2 (two) times daily. (Patient not taking: Reported on 03/16/2022)     denosumab (PROLIA) 60 MG/ML SOSY injection Inject 60 mg into the skin See admin instructions. Every 6 months (Patient not taking: Reported on 03/16/2022)     furosemide (LASIX) 20 MG tablet Take 20 mg by mouth daily as needed for edema. (Patient not taking: Reported on 03/16/2022)     ketoconazole (NIZORAL) 2 % shampoo Apply 1 Application topically See admin instructions. Apply and lather on skin and let sit for a few minutes prior to rinsing when you shower (Patient not taking: Reported on 03/16/2022)     Multiple Vitamins-Minerals (PRESERVISION  AREDS 2) CAPS Take 1 capsule by mouth in the morning and at bedtime. (Patient not taking: Reported on 03/16/2022)     Omega-3 Fatty Acids (FISH OIL PO) Take 15 mLs by mouth at bedtime. (Patient not taking: Reported on 03/16/2022)     OVER THE COUNTER MEDICATION Take 1 Scoop by mouth daily. Kalmegh powder supplement (Patient not taking: Reported on 03/16/2022)     Resveratrol 250 MG CAPS Take 1 capsule by mouth 2 (two) times daily. (Patient not taking: Reported on 03/16/2022)     senna (SENOKOT) 8.6 MG tablet Take 1 tablet by mouth daily as needed for constipation. (Patient not taking: Reported on 03/16/2022)     Turmeric (QC TUMERIC COMPLEX PO) Take by mouth. Unsure of dosage (Patient not taking: Reported on 03/16/2022)        Home: Home Living Family/patient expects to be discharged to:: Private residence Living Arrangements: Children Available Help at Discharge: Family, Available 24 hours/day Type of Home: House Home Access:  Stairs to enter [employer] of Steps: 3 Entrance Stairs-Rails: Left Home Layout: One level Bathroom Shower/Tub: Chiropodist: Standard Bathroom Accessibility: Yes Home Equipment: Rollator (4 wheels), Cane - single point, Grab bars - tub/shower, Grab bars - toilet, Toilet riser, Tub bench   Functional History: Prior Function Prior Level of Function : Needs assist Mobility Comments: Pt uses rollator at baseline for limited household ambulation and SPC + railing for stairs with guarding from daughter ADLs Comments: assist/supervision for safety at all times with ADL's  Functional Status:  Mobility: Bed Mobility Overal bed mobility: Needs Assistance Bed Mobility: Supine to Sit, Sit to Supine Supine to sit: Mod assist, HOB elevated Sit to supine: Max assist, HOB elevated General bed mobility comments: Assist for BLE management and trunk support with use of bed rail, pt initiating with supine>sit able to pull on therapist to assist into  sitting, maxA required for trunk and BLE to return to supine Transfers Overall transfer level: Needs assistance Equipment used: 2 person hand held assist Transfers: Sit to/from Stand Sit to Stand: Max assist, +2 physical assistance General transfer comment: pt declining any attempts with increased encouragement      ADL: ADL Overall ADL's : Needs assistance/impaired Eating/Feeding: Minimal assistance, Sitting Grooming: Minimal assistance, Sitting Upper Body Bathing: Minimal assistance, Moderate assistance, Sitting Lower Body Bathing: Maximal assistance, +2 for safety/equipment, +2 for physical assistance, Sit to/from stand Upper Body Dressing : Moderate assistance, Sitting Lower Body Dressing: Maximal assistance, +2 for physical assistance, +2 for safety/equipment, Sit to/from stand Toilet Transfer: Maximal assistance, +2 for physical assistance, With caregiver independent assisting, Stand-pivot Toileting- Clothing Manipulation and Hygiene: Maximal assistance, +2 for physical assistance, Sit to/from stand, +2 for safety/equipment Functional mobility during ADLs: Maximal assistance, +2 for physical assistance, +2 for safety/equipment General ADL Comments: limited by R hemi deficits, baseline dementia and generalized weakness  Cognition: Cognition Overall Cognitive Status: History of cognitive impairments - at baseline Orientation Level: Oriented to person Cognition Arousal/Alertness: Awake/alert Behavior During Therapy: Agitated Overall Cognitive Status: History of cognitive impairments - at baseline General Comments: Family at bedside and reporting pt did not sleep last night, pt disoriented to children and herself. Intermittently following commands today but encouragement required. Pt lacking awareness of deficits at this time, family reporting cognition is different from baseline  Physical Exam: Blood pressure (!) 125/58, pulse 71, temperature 97.8 F (36.6 C), temperature  source Oral, resp. rate 15, height '5\' 7"'$  (1.702 m), weight 62.8 kg, SpO2 92 %. Physical Exam Neurological:     Comments: Patient is alert and made eye contact with examiner.  Provides name and date of birth but not month or year.  She does follow simple commands.     No results found for this or any previous visit (from the past 48 hour(s)). ECHOCARDIOGRAM COMPLETE BUBBLE STUDY  Result Date: 03/16/2022    ECHOCARDIOGRAM REPORT   Patient Name:   JUDEA PALLESCHI Date of Exam: 03/16/2022 Medical Rec #:  [id number]    Height:       67.0 in Accession #:    [id number]   Weight:       138.4 lb Date of Birth:  24-Jan-1921    BSA:          1.730 m Patient Age:    51 years     BP:           159/83 mmHg Patient Gender: F            HR:  102 bpm. Exam Location:  Inpatient Procedure: 2D Echo, Cardiac Doppler and Color Doppler Indications:    Stroke 434.91 / I63.9  History:        Patient has prior history of Echocardiogram examinations, most                 recent 09/20/2020. Stroke; Risk Factors:Former Smoker.  Sonographer:    Wilkie Aye RVT RCS Referring Phys: [id number] [place]  1. Left ventricular ejection fraction, by estimation, is 55 to 60%. The left ventricle has normal function. The left ventricle has no regional wall motion abnormalities. Left ventricular diastolic parameters are indeterminate.  2. Right ventricular systolic function is normal. The right ventricular size is mildly enlarged. There is mildly elevated pulmonary artery systolic pressure.  3. Left atrial size was severely dilated.  4. Right atrial size was severely dilated.  5. The mitral valve is abnormal. Mild mitral valve regurgitation. No evidence of mitral stenosis.  6. The tricuspid valve is abnormal. Tricuspid valve regurgitation is moderate to severe.  7. The aortic valve was not well visualized. There is severe calcifcation of the aortic valve. There is severe thickening of the aortic valve. Aortic valve regurgitation is  moderate. No aortic stenosis is present.  8. Agitated saline contrast bubble study was negative, with no evidence of any interatrial shunt. FINDINGS  Left Ventricle: Left ventricular ejection fraction, by estimation, is 55 to 60%. The left ventricle has normal function. The left ventricle has no regional wall motion abnormalities. The left ventricular internal cavity size was normal in size. There is  no left ventricular hypertrophy. Left ventricular diastolic parameters are indeterminate. Right Ventricle: The right ventricular size is mildly enlarged. Right vetricular wall thickness was not well visualized. Right ventricular systolic function is normal. There is mildly elevated pulmonary artery systolic pressure. The tricuspid regurgitant  velocity is 2.97 m/s, and with an assumed right atrial pressure of 3 mmHg, the estimated right ventricular systolic pressure is 0000000 mmHg. Left Atrium: Left atrial size was severely dilated. Right Atrium: Right atrial size was severely dilated. Pericardium: There is no evidence of pericardial effusion. Mitral Valve: The mitral valve is abnormal. Mild mitral valve regurgitation. No evidence of mitral valve stenosis. Tricuspid Valve: The tricuspid valve is abnormal. Tricuspid valve regurgitation is moderate to severe. No evidence of tricuspid stenosis. Aortic Valve: The aortic valve was not well visualized. There is severe calcifcation of the aortic valve. There is severe thickening of the aortic valve. There is severe aortic valve annular calcification. Aortic valve regurgitation is moderate. No aortic stenosis is present. Aortic valve mean gradient measures 8.0 mmHg. Aortic valve peak gradient measures 14.1 mmHg. Aortic valve area, by VTI measures 2.50 cm. Pulmonic Valve: The pulmonic valve was not well visualized. Pulmonic valve regurgitation is not visualized. No evidence of pulmonic stenosis. Aorta: The aortic root is normal in size and structure. IAS/Shunts: No atrial level  shunt detected by color flow Doppler. Agitated saline contrast bubble study was negative, with no evidence of any interatrial shunt.  LEFT VENTRICLE PLAX 2D LVIDd:         4.30 cm   Diastology LVIDs:         3.90 cm   LV e' medial:    6.50 cm/s LV PW:         1.05 cm   LV E/e' medial:  17.6 LV IVS:        1.05 cm   LV e' lateral:   11.00 cm/s LVOT  diam:     2.20 cm   LV E/e' lateral: 10.4 LV SV:         67 LV SV Index:   39 LVOT Area:     3.80 cm  RIGHT VENTRICLE            IVC RV Basal diam:  4.20 cm    IVC diam: 2.10 cm RV S prime:     7.62 cm/s TAPSE (M-mode): 1.5 cm LEFT ATRIUM              Index        RIGHT ATRIUM           Index LA diam:        4.30 cm  2.49 cm/m   RA Area:     33.60 cm LA Vol (A2C):   127.0 ml 73.43 ml/m  RA Volume:   117.00 ml 67.65 ml/m LA Vol (A4C):   111.0 ml 64.18 ml/m LA Biplane Vol: 120.0 ml 69.38 ml/m  AORTIC VALVE AV Area (Vmax):    2.64 cm AV Area (Vmean):   2.23 cm AV Area (VTI):     2.50 cm AV Vmax:           188.00 cm/s AV Vmean:          132.000 cm/s AV VTI:            0.269 m AV Peak Grad:      14.1 mmHg AV Mean Grad:      8.0 mmHg LVOT Vmax:         130.71 cm/s LVOT Vmean:        77.391 cm/s LVOT VTI:          0.177 m LVOT/AV VTI ratio: 0.66 AR Vena Contracta: 0.40 cm  AORTA Ao Root diam: 2.70 cm MITRAL VALVE                TRICUSPID VALVE MV Area (PHT): 3.94 cm     TR Peak grad:   35.3 mmHg MV Decel Time: 193 msec     TR Vmax:        297.00 cm/s MV E velocity: 114.67 cm/s                             SHUNTS                             Systemic VTI:  0.18 m                             Systemic Diam: 2.20 cm Carlyle Dolly MD Electronically signed by Carlyle Dolly MD Signature Date/Time: 03/16/2022/6:55:53 PM    Final       Blood pressure (!) 125/58, pulse 71, temperature 97.8 F (36.6 C), temperature source Oral, resp. rate 15, height '5\' 7"'$  (1.702 m), weight 62.8 kg, SpO2 92 %.  Medical Problem List and Plan: 1. Functional deficits secondary to acute  infarcts in the left precentral gyrus and left posterior frontal lobe likely embolic in the setting of A-fib  -patient may *** shower  -ELOS/Goals: *** 2.  Antithrombotics: -DVT/anticoagulation:  Pharmaceutical: Eliquis  -antiplatelet therapy: N/A 3. Pain Management: Tylenol as needed 4. Mood/Behavior/Sleep: Namenda 10 mg twice daily, melatonin as needed  -antipsychotic agents: N/A 5. Neuropsych/cognition: This patient is not capable of making decisions on  her own behalf. 6. Skin/Wound Care: Routine skin checks 7. Fluids/Electrolytes/Nutrition: Routine in and outs with follow-up chemistries 8.  Atrial fibrillation with RVR LAA thrombus.  Continue Eliquis, Cardizem 360 mg daily.  Cardiac rate controlled 9.  UTI/Proteus Mirabalis.  Complete  course of Rocephin 10.  Hyperlipidemia.  Lipitor 11.  Essential hypertension.  Continue Cardizem as directed 12.  Diabetes mellitus.  Diet controlled at baseline.  Hemoglobin A1c 5.6.   Lavon Paganini Angiulli, PA-C 03/18/2022

## 2022-03-18 NOTE — Discharge Instructions (Signed)

## 2022-03-18 NOTE — PMR Pre-admission (Signed)
PMR Admission Coordinator Pre-Admission Assessment  Patient: Bridget [name] is an 89 y.o., female MRN: [id number] DOB: Aug 13, 1921 Height: 5\' 7"  (170.2 cm) Weight: 62.8 kg  Insurance Information HMO:     PPO:      PCP:      IPA:      80/20:      OTHER:  PRIMARY: Medicare A/B      Policy#: [id number]      Subscriber: pt CM Name:       Phone#:      Fax#:  Pre-Cert#: verified Health and safety inspector:  Benefits:  Phone #:      Name:  Eff. Date: 11/06/88 A/B     Deduct: $1632      Out of Pocket Max: n/a      Life Max: n/a CIR: 100%      SNF: 20 full days Outpatient: 80%     Co-Pay: 20% Home Health: 100%      Co-Pay:  DME: 80%     Co-Pay: 20% Providers:  SECONDARY: BCBS       Policy#: [id number]     Phone#: [phone]  Financial Counselor:       Phone#:   The "Data Collection Information Summary" for patients in Inpatient Rehabilitation Facilities with attached "Privacy Act Statement-Health Care Records" was provided and verbally reviewed with: Patient and Family  Emergency Contact Information Contact Information     Name Relation Home Work Mobile   causey,jacque Daughter [phone]  [phone]   Kem Parkinson Daughter [phone]  [phone]   Asmara, Leng [phone]  [phone]       Current Medical History  Patient Admitting Diagnosis: CVA  History of Present Illness: Pt is a 89 y/o female with PMH of Afib, CVA, CAD, breast cancer, osteoporosis, dementia, and pulmonary nodule admitted to [facility] on 03/15/22 with c/o R sided facial droop and slurred speech.  In ED vitals stable, labs unremarkable, urinalysis showed evidence of UTI.  CT abd/pelvis showed constipation.  CT head negative, CTA head/neck negative.  Neurology was consulted and recommended CVA workup.  MRI showed multiple strokes in the left precentral gyrus and posterior frontal lobe.  Given pt on eliquis for Afib cardiology was consulted.  Cardiology adjusting medications for goal of rate control.  Family  decline TEE.  TTE showed LVEF 55-60%.  Therapy ongoing and pt was recommended for CIR.   Complete NIHSS TOTAL: 2  Patient's medical record from [facility] has been reviewed by the rehabilitation admission coordinator and physician.  Past Medical History  Past Medical History:  Diagnosis Date   Atherosclerosis of aorta (HCC)    Atrial fibrillation (HCC)    Breast cancer (HCC)    Chronic Bronchitis    Coronary atherosclerosis due to calcified coronary lesion    Nocturnal polyuria    Stroke (HCC)    Thrombus of left atrial appendage     Has the patient had major surgery during 100 days prior to admission? No  Family History   family history includes Other in her sister.  Current Medications  Current Facility-Administered Medications:     stroke: early stages of recovery book, , Does not apply, Once, Pearlean Brownie, Pramod S, MD   acetaminophen (TYLENOL) tablet 650 mg, 650 mg, Oral, Q6H PRN, Darlin Drop, DO, 650 mg at 03/17/22 2047   albuterol (PROVENTIL) (2.5 MG/3ML) 0.083% nebulizer solution 2.5 mg, 2.5 mg, Inhalation, Q6H PRN, Dow Adolph N, DO   apixaban (ELIQUIS) tablet 5 mg, 5 mg,  Oral, BID, Harolyn Rutherford, MD, 5 mg at 03/18/22 0849   atorvastatin (LIPITOR) tablet 80 mg, 80 mg, Oral, Daily, Darlin Drop, DO, 80 mg at 03/18/22 0849   cefTRIAXone (ROCEPHIN) 2 g in sodium chloride 0.9 % 100 mL IVPB, 2 g, Intravenous, Q24H, [place], Alvira Philips, DO, Last Rate: 200 mL/hr at 03/18/22 0854, 2 g at 03/18/22 0854   diltiazem (CARDIZEM CD) 24 hr capsule 360 mg, 360 mg, Oral, Daily, Tolia, Sunit, DO, 360 mg at 03/18/22 0849   labetalol (NORMODYNE) injection 5 mg, 5 mg, Intravenous, Q2H PRN, Hall, Carole N, DO   melatonin tablet 3 mg, 3 mg, Oral, QHS PRN, Hall, Carole N, DO   melatonin tablet 3 mg, 3 mg, Oral, QHS, [place], Eric J, DO, 3 mg at 03/17/22 2048   memantine (NAMENDA) tablet 10 mg, 10 mg, Oral, BID, Hall, Carole N, DO, 10 mg at 03/18/22 0849   mometasone-formoterol (DULERA) 200-5  MCG/ACT inhaler 2 puff, 2 puff, Inhalation, BID, 2 puff at 03/18/22 0852 **AND** umeclidinium bromide (INCRUSE ELLIPTA) 62.5 MCG/ACT 1 puff, 1 puff, Inhalation, Daily, [place], Eric J, DO   polyethylene glycol (MIRALAX / GLYCOLAX) packet 17 g, 17 g, Oral, Daily PRN, Hall, Carole N, DO   prochlorperazine (COMPAZINE) injection 5 mg, 5 mg, Intravenous, Q6H PRN, Dow Adolph N, DO  Patients Current Diet:  Diet Order             Diet regular Room service appropriate? Yes; Fluid consistency: Thin  Diet effective now                   Precautions / Restrictions Precautions Precautions: Fall Precaution Comments: watch HR Restrictions Weight Bearing Restrictions: No   Has the patient had 2 or more falls or a fall with injury in the past year? No  Prior Activity Level Household: limited household ambulation, supervision with rollator for mobility, assist for ADLs, hx of dementia  Prior Functional Level Self Care: Did the patient need help bathing, dressing, using the toilet or eating? Needed some help  Indoor Mobility: Did the patient need assistance with walking from room to room (with or without device)? Needed some help  Stairs: Did the patient need assistance with internal or external stairs (with or without device)? Needed some help  Functional Cognition: Did the patient need help planning regular tasks such as shopping or remembering to take medications? Dependent  Patient Information Are you of Hispanic, Latino/a,or Spanish origin?: A. No, not of Hispanic, Latino/a, or Spanish origin, X. Patient unable to respond (proxy) What is your race?: A. White, X. Patient unable to respond (proxy) Do you need or want an interpreter to communicate with a doctor or health care staff?: 9. Unable to respond  Patient's Response To:  Health Literacy and Transportation Is the patient able to respond to health literacy and transportation needs?: No Health Literacy - How often do you need to  have someone help you when you read instructions, pamphlets, or other written material from your doctor or pharmacy?: Patient unable to respond  Home Assistive Devices / Equipment Home Equipment: Rollator (4 wheels), Cane - single point, Grab bars - tub/shower, Grab bars - toilet, Toilet riser, Tub bench  Prior Device Use: Indicate devices/aids used by the patient prior to current illness, exacerbation or injury? Walker  Current Functional Level Cognition  Overall Cognitive Status: History of cognitive impairments - at baseline Orientation Level: Oriented to person, Oriented to place, Disoriented to time, Disoriented to situation General  Comments: Daughter at bedside, reporting better night of sleep. Pt able to state her name and reports her daughter is her mother, which the daughter says is normal for her to say. Pt less agitated and more participatory and pleasant today    Extremity Assessment (includes Sensation/Coordination)  Upper Extremity Assessment: RUE deficits/detail RUE Deficits / Details: grossly 2/5, difficult to fully assess due to impaired cognition RUE Sensation: decreased light touch, decreased proprioception RUE Coordination: decreased fine motor, decreased gross motor  Lower Extremity Assessment: Defer to PT evaluation RLE Deficits / Details: Grossly 2/5    ADLs  Overall ADL's : Needs assistance/impaired Eating/Feeding: Minimal assistance, Sitting Grooming: Minimal assistance, Sitting Upper Body Bathing: Minimal assistance, Moderate assistance, Sitting Lower Body Bathing: Maximal assistance, +2 for safety/equipment, +2 for physical assistance, Sit to/from stand Upper Body Dressing : Moderate assistance, Sitting Lower Body Dressing: Maximal assistance, +2 for physical assistance, +2 for safety/equipment, Sit to/from stand Toilet Transfer: Maximal assistance, +2 for physical assistance, With caregiver independent assisting, Stand-pivot Toileting- Clothing Manipulation  and Hygiene: Maximal assistance, +2 for physical assistance, Sit to/from stand, +2 for safety/equipment Functional mobility during ADLs: Maximal assistance, +2 for physical assistance, +2 for safety/equipment General ADL Comments: limited by R hemi deficits, baseline dementia and generalized weakness    Mobility  Overal bed mobility: Needs Assistance Bed Mobility: Supine to Sit, Sit to Supine Supine to sit: Mod assist, HOB elevated Sit to supine: Max assist, HOB elevated General bed mobility comments: Assist for BLE management and trunk support with use of bed rail, pt initiating with supine>sit able to pull on therapist to assist into sitting, maxA required for trunk and BLE to return to supine    Transfers  Overall transfer level: Needs assistance Equipment used: 2 person hand held assist Transfers: Sit to/from Stand Sit to Stand: Max assist, +2 physical assistance General transfer comment: pt declining any attempts with increased encouragement    Ambulation / Gait / Stairs / Engineer, drilling / Balance Dynamic Sitting Balance Sitting balance - Comments: pt with BUE in lap, requiring min-modA to maintain sitting balance but brief moments of minG and use of trunk to correct posterior lean Balance Overall balance assessment: Needs assistance Sitting-balance support: Feet supported, No upper extremity supported Sitting balance-Leahy Scale: Poor Sitting balance - Comments: pt with BUE in lap, requiring min-modA to maintain sitting balance but brief moments of minG and use of trunk to correct posterior lean Postural control: Posterior lean Standing balance support: Bilateral upper extremity supported Standing balance-Leahy Scale: Poor Standing balance comment: reliant on external support    Special needs/care consideration Behavioral consideration dementia   Previous Home Environment (from acute therapy documentation) Living Arrangements: Children Available Help at  Discharge: Family, Available 24 hours/day Type of Home: House Home Layout: One level Home Access: Stairs to enter Entrance Stairs-Rails: Left Entrance Stairs-Number of Steps: 3 Bathroom Shower/Tub: Armed forces operational officer Accessibility: Yes  Discharge Living Setting Plans for Discharge Living Setting: Lives with (comment) (daughter, Annice Pih) Type of Home at Discharge: House Discharge Home Layout: One level Discharge Home Access: Stairs to enter Entrance Stairs-Rails: Left Entrance Stairs-Number of Steps: 3 Discharge Bathroom Shower/Tub: Tub/shower unit Discharge Bathroom Toilet: Standard Discharge Bathroom Accessibility: Yes How Accessible: Accessible via walker Does the patient have any problems obtaining your medications?: No  Social/Family/Support Systems Anticipated Caregiver: daughter, Annice Pih, is primary caregiver and contact Anticipated Caregiver's Contact Information: [phone] Ability/Limitations of Caregiver: min assist mobility/ADLs Caregiver Availability: 24/7  Discharge Plan Discussed with Primary Caregiver: Yes Is Caregiver In Agreement with Plan?: Yes Does Caregiver/Family have Issues with Lodging/Transportation while Pt is in Rehab?: No  Goals Patient/Family Goal for Rehab: PT/OT min assist, SLP n/a Expected length of stay: 12-14 days Additional Information: Discharge plan: discharge to daughter's home with Annice Pih providing 24/7 assist, can d/c to SNF if needed, has 20 SNF days remaining Pt/Family Agrees to Admission and willing to participate: Yes Program Orientation Provided & Reviewed with Pt/Caregiver Including Roles  & Responsibilities: Yes  Barriers to Discharge: Home environment access/layout, Decreased caregiver support  Decrease burden of Care through IP rehab admission: n/a  Possible need for SNF placement upon discharge: Potentially.  Pt's daughter can provide up to min assist for mobility and ADLs and 24/7 care.  If pt  unable to reach this level may need short term SNF.   Patient Condition: I have reviewed medical records from [facility], spoken with CSW, and patient and daughter. I met with patient at the bedside for inpatient rehabilitation assessment.  Patient will benefit from ongoing PT and OT, can actively participate in 3 hours of therapy a day 5 days of the week, and can make measurable gains during the admission.  Patient will also benefit from the coordinated team approach during an Inpatient Acute Rehabilitation admission.  The patient will receive intensive therapy as well as Rehabilitation physician, nursing, social worker, and care management interventions.  Due to bladder management, bowel management, safety, skin/wound care, disease management, medication administration, pain management, and patient education the patient requires 24 hour a day rehabilitation nursing.  The patient is currently max assist with mobility and basic ADLs.  Discharge setting and therapy post discharge at  home  is anticipated.  Patient has agreed to participate in the Acute Inpatient Rehabilitation Program and will admit {Time; today/tomorrow:10263}.  Preadmission Screen Completed By:  Stephania Fragmin, PT, DPT 03/18/2022 2:50 PM ______________________________________________________________________   Discussed status with Dr. Marland Kitchen on *** at *** and received approval for admission today.  Admission Coordinator:  Stephania Fragmin, PT, time 2:59 PM Dorna Bloom 03/18/22    Assessment/Plan: Diagnosis: Does the need for close, 24 hr/day Medical supervision in concert with the patient's rehab needs make it unreasonable for this patient to be served in a less intensive setting? {yes_no_potentially:3041433} Co-Morbidities requiring supervision/potential complications: *** Due to {due ZO:1096045}, does the patient require 24 hr/day rehab nursing? {yes_no_potentially:3041433} Does the patient require coordinated care of a physician, rehab  nurse, PT, OT, and SLP to address physical and functional deficits in the context of the above medical diagnosis(es)? {yes_no_potentially:3041433} Addressing deficits in the following areas: {deficits:3041436} Can the patient actively participate in an intensive therapy program of at least 3 hrs of therapy 5 days a week? {yes_no_potentially:3041433} The potential for patient to make measurable gains while on inpatient rehab is {potential:3041437} Anticipated functional outcomes upon discharge from inpatient rehab: {functional outcomes:304600100} PT, {functional outcomes:304600100} OT, {functional outcomes:304600100} SLP Estimated rehab length of stay to reach the above functional goals is: *** Anticipated discharge destination: {anticipated dc setting:21604} 10. Overall Rehab/Functional Prognosis: {potential:3041437}   MD Signature: ***

## 2022-03-18 NOTE — Progress Notes (Signed)
Inpatient Rehab Admissions Coordinator:   I will not have a bed for this patient to admit to CIR today.  Will follow for potential admit pending bed availability.   Shann Medal, PT, DPT Admissions Coordinator [phone] 03/18/22  10:00 AM

## 2022-03-19 DIAGNOSIS — E7800 Pure hypercholesterolemia, unspecified: Secondary | ICD-10-CM

## 2022-03-19 DIAGNOSIS — N3000 Acute cystitis without hematuria: Secondary | ICD-10-CM

## 2022-03-19 DIAGNOSIS — I4821 Permanent atrial fibrillation: Secondary | ICD-10-CM

## 2022-03-19 NOTE — Progress Notes (Signed)
PROGRESS NOTE        PATIENT DETAILS Name: Bridget [name] Age: 89 y.o. Sex: female Date of Birth: [birth year] Admit Date: 03/15/2022 Admitting Physician Kayleen Memos, DO [id number], Fransico Him, MD  Brief Summary: Patient is a 89 y.o.  female with history of A-fib, CAD, dementia who presented with right-sided weakness/slurred speech-further evaluation confirmed acute CVA   Significant events: 3/9>> admit to [facility]  Significant studies: 3/9>> CT angio head/neck: No LVO, severe stenosis in the left A2 segment.  No significant stenosis in the neck. 3/10>> MRI brain: Acute infarct in the left posterior frontal cortex, additional small acute infarcts in the anterior left frontal lobe 3/10>> LDL: 154 3/10>> A1c 5.6 3/10>> echo: EF 55-60%  Significant microbiology data: 3/9>> urine culture: Proteus  Procedures: None  Consults: Cardiology Neurology  Subjective: Lying comfortably in bed-denies any chest pain or shortness of breath.  Continues to have weakness in the right upper/lower extremity.  Objective: Vitals: Blood pressure 134/70, pulse 93, temperature 97.7 F (36.5 C), temperature source Axillary, resp. rate 18, height '5\' 7"'$  (1.702 m), weight 62.8 kg, SpO2 94 %.   Exam: Gen Exam:Alert awake-not in any distress HEENT:atraumatic, normocephalic Chest: B/L clear to auscultation anteriorly CVS:S1S2 regular Abdomen:soft non tender, non distended Extremities:no edema Neurology: Right upper/lower weakness persists Skin: no rash  Pertinent Labs/Radiology:    Latest Ref Rng & Units 03/16/2022    4:50 AM 03/15/2022   10:28 PM 03/15/2022   10:20 PM  CBC  WBC 4.0 - 10.5 K/uL 4.4   5.5   Hemoglobin 12.0 - 15.0 g/dL 12.8  12.9  12.6   Hematocrit 36.0 - 46.0 % 38.2  38.0  37.8   Platelets 150 - 400 K/uL 200   210     Lab Results  Component Value Date   NA 136 03/16/2022   K 4.1 03/16/2022   CL 101 03/16/2022   CO2 25 03/16/2022       Assessment/Plan: Acute CVA Continues to have right-sided weakness Thought to be embolic-in spite of patient being on Eliquis. Workup as above Neurology recommending we continue Eliquis-as there is no definitive data suggesting switching to Xarelto or Pradaxa is necessarily better.  Per neurology note-she is not a candidate for Watchman device Awaiting CIR bed.  Permanent atrial fibrillation with RVR Known L LAA thrombus Rate controlled with Cardizem On Eliquis Cardiology consulted during this hospitalization  Proteus UTI Completed antibiotic course on 3/13  HTN  BP stable Continue Cardizem  HLD On high intensity statin  DM-2 Diet controlled  Dementia Minimally-pleasantly confused this morning-great granddaughter at bedside. Maintain delirium precautions Resume Namenda on d/c   BMI: Estimated body mass index is 21.68 kg/m as calculated from the following:   Height as of this encounter: '5\' 7"'$  (1.702 m).   Weight as of this encounter: 62.8 kg.   Code status:   Code Status: DNR   DVT Prophylaxis: apixaban (ELIQUIS) tablet 5 mg    Family Communication: Great grand daughter at bedside.   Disposition Plan: Status is: Inpatient Remains inpatient appropriate because: Severity of illness-awaiting CIR bed   Planned Discharge Destination: CIR    Diet: Diet Order             Diet regular Room service appropriate? Yes; Fluid consistency: Thin  Diet effective now  Antimicrobial agents: Anti-infectives (From admission, onward)    Start     Dose/Rate Route Frequency Ordered Stop   03/16/22 1000  cefTRIAXone (ROCEPHIN) 2 g in sodium chloride 0.9 % 100 mL IVPB        2 g 200 mL/hr over 30 Minutes Intravenous Every 24 hours 03/16/22 0158 03/19/22 0909   03/15/22 2345  cefTRIAXone (ROCEPHIN) 1 g in sodium chloride 0.9 % 100 mL IVPB        1 g 200 mL/hr over 30 Minutes Intravenous  Once 03/15/22 2331 03/16/22 0018         MEDICATIONS: Scheduled Meds:  apixaban  5 mg Oral BID   atorvastatin  80 mg Oral Daily   diltiazem  360 mg Oral Daily   melatonin  3 mg Oral QHS   memantine  10 mg Oral BID   mometasone-formoterol  2 puff Inhalation BID   And   umeclidinium bromide  1 puff Inhalation Daily   Continuous Infusions: PRN Meds:.acetaminophen, albuterol, labetalol, melatonin, polyethylene glycol, prochlorperazine   I have personally reviewed following labs and imaging studies  LABORATORY DATA: CBC: Recent Labs  Lab 03/15/22 2220 03/15/22 2228 03/16/22 0450  WBC 5.5  --  4.4  NEUTROABS 2.9  --   --   HGB 12.6 12.9 12.8  HCT 37.8 38.0 38.2  MCV 100.0  --  98.2  PLT 210  --  [id number]    Basic Metabolic Panel: Recent Labs  Lab 03/15/22 2220 03/15/22 2228 03/16/22 0450  NA 130* 130* 136  K 4.9 4.9 4.1  CL 95* 97* 101  CO2 23  --  25  GLUCOSE 92 92 109*  BUN '17 22 15  '$ CREATININE 0.81 0.90 0.75  CALCIUM 9.3  --  8.8*  MG  --   --  2.2  PHOS  --   --  3.7    GFR: Estimated Creatinine Clearance: 38.2 mL/min (by C-G formula based on SCr of 0.75 mg/dL).  Liver Function Tests: Recent Labs  Lab 03/15/22 2220  AST 21  ALT 13  ALKPHOS 55  BILITOT 0.8  PROT 6.1*  ALBUMIN 3.4*   No results for input(s): "LIPASE", "AMYLASE" in the last 168 hours. No results for input(s): "AMMONIA" in the last 168 hours.  Coagulation Profile: Recent Labs  Lab 03/15/22 2220  INR 1.1    Cardiac Enzymes: No results for input(s): "CKTOTAL", "CKMB", "CKMBINDEX", "TROPONINI" in the last 168 hours.  BNP (last 3 results) No results for input(s): "PROBNP" in the last 8760 hours.  Lipid Profile: No results for input(s): "CHOL", "HDL", "LDLCALC", "TRIG", "CHOLHDL", "LDLDIRECT" in the last 72 hours.  Thyroid Function Tests: No results for input(s): "TSH", "T4TOTAL", "FREET4", "T3FREE", "THYROIDAB" in the last 72 hours.  Anemia Panel: No results for input(s): "VITAMINB12", "FOLATE", "FERRITIN",  "TIBC", "IRON", "RETICCTPCT" in the last 72 hours.  Urine analysis:    Component Value Date/Time   COLORURINE YELLOW 03/15/2022 2300   APPEARANCEUR CLOUDY (A) 03/15/2022 2300   LABSPEC 1.021 03/15/2022 2300   PHURINE 6.0 03/15/2022 2300   GLUCOSEU NEGATIVE 03/15/2022 2300   HGBUR NEGATIVE 03/15/2022 2300   BILIRUBINUR NEGATIVE 03/15/2022 2300   KETONESUR NEGATIVE 03/15/2022 2300   PROTEINUR NEGATIVE 03/15/2022 2300   NITRITE POSITIVE (A) 03/15/2022 2300   LEUKOCYTESUR LARGE (A) 03/15/2022 2300    Sepsis Labs: Lactic Acid, Venous    Component Value Date/Time   LATICACIDVEN 1.6 09/15/2021 0858    MICROBIOLOGY: Recent Results (from the past 240 hour(s))  Urine Culture (for pregnant, neutropenic or urologic patients or patients with an indwelling urinary catheter)     Status: Abnormal   Collection Time: 03/15/22 11:00 PM   Specimen: Urine, Clean Catch  Result Value Ref Range Status   Specimen Description URINE, CLEAN CATCH  Final   Special Requests   Final    NONE Performed at [facility], [place] [address] [place], [place] [address]    Culture >=100,000 COLONIES/mL PROTEUS MIRABILIS (A)  Final   Report Status 03/18/2022 FINAL  Final   Organism ID, Bacteria PROTEUS MIRABILIS (A)  Final      Susceptibility   Proteus mirabilis - MIC*    AMPICILLIN <=2 SENSITIVE Sensitive     CEFAZOLIN <=4 SENSITIVE Sensitive     CEFEPIME <=0.12 SENSITIVE Sensitive     CEFTRIAXONE <=0.25 SENSITIVE Sensitive     CIPROFLOXACIN <=0.25 SENSITIVE Sensitive     GENTAMICIN <=1 SENSITIVE Sensitive     IMIPENEM 2 SENSITIVE Sensitive     NITROFURANTOIN 256 RESISTANT Resistant     TRIMETH/SULFA <=20 SENSITIVE Sensitive     AMPICILLIN/SULBACTAM <=2 SENSITIVE Sensitive     PIP/TAZO <=4 SENSITIVE Sensitive     * >=100,000 COLONIES/mL PROTEUS MIRABILIS    RADIOLOGY STUDIES/RESULTS: No results found.   LOS: 3 days   Oren Binet, MD  [facility]    To contact the attending  provider between 7A-7P or the covering provider during after hours 7P-7A, please log into the web site [email or web address] and access using universal [place] password for that web site. If you do not have the password, please call the hospital operator.  03/19/2022, 1:13 PM

## 2022-03-19 NOTE — TOC Initial Note (Signed)
Transition of Care [facility]) - Initial/Assessment Note    Patient Details  Name: Bridget [name] MRN: [id number] Date of Birth: 11/09/1921  Transition of Care [facility]) CM/SW Contact:    Benard Halsted, LCSW Phone Number: 03/19/2022, 2:55 PM  Clinical Narrative:                 CSW met with patient's daughter at bedside and made her aware that per CIR they are not likely to have a bed available soon and suggested looking into other venues. Daughter discussed with her daughter, Daneen Schick, and they reported agreement to fax referral to [facility] and [facility]. CSW spoke with Webb Silversmith at [facility] and faxed referral over to f. [phone].  Expected Discharge Plan: IP Rehab Facility Barriers to Discharge:  (CIR bed)   Patient Goals and CMS Choice Patient states their goals for this hospitalization and ongoing recovery are:: Rehab CMS Medicare.gov Compare Post Acute Care list provided to:: Patient Represenative (must comment) Choice offered to / list presented to : Adult [place] ownership interest in [facility].provided to:: Adult Children    Expected Discharge Plan and Services In-house Referral: Clinical Social Work   Post Acute Care Choice: IP Rehab Living arrangements for the past 2 months: Single Family Home                                      Prior Living Arrangements/Services Living arrangements for the past 2 months: Single Family Home Lives with:: Adult Children, Relatives Patient language and need for interpreter reviewed:: Yes Do you feel safe going back to the place where you live?: Yes      Need for Family Participation in Patient Care: Yes (Comment) Care giver support system in place?: Yes (comment)   Criminal Activity/Legal Involvement Pertinent to Current Situation/Hospitalization: No - Comment as needed  Activities of Daily Living      Permission Sought/Granted Permission sought to share information with : Facility Sport and exercise psychologist,  Family Supports    Share Information with NAME: [name]/Josie  Permission granted to share info w AGENCY: IR  Permission granted to share info w Relationship: Daughter  Permission granted to share info w Contact Information: [phone]  Emotional Assessment Appearance:: Appears stated age Attitude/Demeanor/Rapport: Unable to Assess Affect (typically observed): Appropriate Orientation: : Oriented to Self, Oriented to Place Alcohol / Substance Use: Not Applicable Psych Involvement: No (comment)  Admission diagnosis:  CVA (cerebral vascular accident) ([place]) [I63.9] Acute cystitis without hematuria [N30.00] Cerebrovascular accident (CVA), unspecified mechanism ([place]) [I63.9] Patient Active Problem List   Diagnosis Date Noted   CVA (cerebral vascular accident) ([place]) 03/16/2022   Atrial fibrillation with rapid ventricular response ([place]) 03/16/2022   Permanent atrial fibrillation ([place]) 03/16/2022   Current use of long term anticoagulation 03/16/2022   Abnormality of left atrial appendage 03/16/2022   Atherosclerosis of aorta ([place]) 03/16/2022   Former smoker 03/16/2022   Pure hypercholesterolemia 03/16/2022   Acute CVA (cerebrovascular accident) ([place]) 03/16/2022   Pneumonia 09/15/2021   Sepsis ([place]) 09/15/2021   Hyperglycemia 09/15/2021   Hyperbilirubinemia 09/15/2021   Chronic atrial fibrillation with RVR ([place]) 09/15/2021   Dementia ([place]) 09/15/2021   Hypomagnesemia 09/15/2021   Annual physical exam 07/02/2021   Bilateral sensorineural hearing loss 07/02/2021   Elevated LDL cholesterol level 07/02/2021   Chronic atelectasis 04/11/2021   Knee pain, bilateral 03/28/2021   Idiopathic peripheral neuropathy 03/18/2021   Numbness  of foot 03/18/2021   Pain in the coccyx 11/19/2020   Bronchiectasis without complication ([place]) [id number]   Lung nodule, multiple 10/02/2020   Coronary artery calcification seen on computed tomography    Thrombus of left atrial appendage    Scoliosis  deformity of spine 09/28/2019   Osteoarthritis of knee 05/31/2019   Pes anserinus bursitis of right knee 01/11/2019   Lumbar pain 10/06/2018   Osteoarthritis of left knee 09/15/2018   Pain in left knee 08/23/2018   Pain in right knee 08/23/2018   Osteoarthritis of right knee 08/09/2018   Chronic vasomotor rhinitis 08/06/2018   Chronic cystitis 01/30/2018   PCP:  Haywood Pao, MD Pharmacy:   [id number] [place], [place] [id number]: [phone]: [address]#[id number]-[id number]: [phone]: [phone]    Social Determinants of Health (SDOH) Social History: SDOH Screenings   Tobacco Use: Medium Risk (02/02/2022)   SDOH Interventions:     Readmission Risk Interventions     No data to display

## 2022-03-19 NOTE — Evaluation (Signed)
Occupational Therapy Evaluation Patient Details Name: Bridget [name] MRN: [id number] DOB: [birth year] Today's Date: 03/19/2022   History of Present Illness Bridget [name] is a 89 y.o. female who presents 03/15/2022 who presented to [facility] ED from home as a code stroke with right facial drooling and dysarthria. Acute infarcts in the left posterior frontal cortex, including the  precentral gyrus.  UA was positive for pyuri. Medical history significant for dementia requiring 24/7 assistance, permanent atrial fibrillation on Eliquis, thrombus of atrial appendage, prior history of stroke with no residual deficits, history of breast cancer status post lumpectomy, osteoporosis and bronchiectasis.   Clinical Impression   Pt is making fair progress towards their acute OT goals. [place] pt required mod A to get to the EOB with time and cues due to impaired cognition. Once sitting, pt agreeable to complete LB ADLs. She tolerated standing 5x with max A +2 and RW, and needed max A for clothing management and hygiene. Pt's granddaughter was present and assisting and thankful for caregiver education throughout. OT to continue to follow acutely. Discharge recommendation remains appropriate.      Recommendations for follow up therapy are one component of a multi-disciplinary discharge planning process, led by the attending physician.  Recommendations may be updated based on patient status, additional functional criteria and insurance authorization.   Follow Up Recommendations  Acute inpatient rehab (3hours/day)     Assistance Recommended at Discharge Frequent or constant Supervision/Assistance  Patient can return home with the following Two people to help with walking and/or transfers;A lot of help with walking and/or transfers;Two people to help with bathing/dressing/bathroom;A lot of help with bathing/dressing/bathroom;Direct supervision/assist for medications management;Direct supervision/assist for financial  management;Assist for transportation;Help with stairs or ramp for entrance;Assistance with cooking/housework       Equipment Recommendations  Other (comment)    Recommendations for Other Services Rehab consult     Precautions / Restrictions Precautions Precautions: Fall Precaution Comments: watch HR Restrictions Weight Bearing Restrictions: No      Mobility Bed Mobility Overal bed mobility: Needs Assistance Bed Mobility: Supine to Sit, Sit to Supine     Supine to sit: Mod assist, HOB elevated Sit to supine: Max assist, HOB elevated   General bed mobility comments: maximal cues needed to initiate. limited by cog and fear of falling    Transfers Overall transfer level: Needs assistance Equipment used: Rolling walker (2 wheels) Transfers: Sit to/from Stand Sit to Stand: Max assist, +2 safety/equipment, +2 physical assistance           General transfer comment: x5      Balance Overall balance assessment: Needs assistance Sitting-balance support: Feet supported, Bilateral upper extremity supported, Single extremity supported Sitting balance-Leahy Scale: Poor Sitting balance - Comments: min A   Standing balance support: Bilateral upper extremity supported, During functional activity Standing balance-Leahy Scale: Poor                             ADL either performed or assessed with clinical judgement   ADL Overall ADL's : Needs assistance/impaired                     Lower Body Dressing: Maximal assistance;+2 for physical assistance;+2 for safety/equipment;Sit to/from stand Lower Body Dressing Details (indicate cue type and reason): doffed and donned underwear Toilet Transfer: Maximal assistance;+2 for safety/equipment;+2 for physical assistance;Rolling walker (2 wheels) Toilet Transfer Details (indicate cue type and reason): simulated EOB  Functional mobility during ADLs: Maximal assistance;+2 for physical assistance;+2 for  safety/equipment General ADL Comments: requires constant redirection and cues. limited by R hemi, baseline cog and weakness. only tolerated standing ~5 seconds at a time     Vision   Vision Assessment?: No apparent visual deficits     Perception     Praxis      Pertinent Vitals/Pain Pain Assessment Pain Assessment: No/denies pain     Hand Dominance     Extremity/Trunk Assessment Upper Extremity Assessment Upper Extremity Assessment: RUE deficits/detail RUE Deficits / Details: grossly 2/5, difficult to fully assess due to impaired cognition. fairly god proprioception of UE noted, pt dependently moving limb with LUE RUE Sensation: decreased light touch;decreased proprioception RUE Coordination: decreased fine motor;decreased gross motor   Lower Extremity Assessment Lower Extremity Assessment: Defer to PT evaluation       Communication     Cognition Arousal/Alertness: Awake/alert Behavior During Therapy: WFL for tasks assessed/performed Overall Cognitive Status: History of cognitive impairments - at baseline                                 General Comments: following commands most of the time, moments of agitation but easily redirected     General Comments  VSS throughout, granddaughter present and assisting                   OT Goals(Current goals can be found in the care plan section) Acute Rehab OT Goals Patient Stated Goal: to rest OT Goal Formulation: With patient Time For Goal Achievement: 03/30/22 Potential to Achieve Goals: Good ADL Goals Pt Will Perform Upper Body Dressing: with min assist;sitting Pt Will Perform Lower Body Dressing: with mod assist;sit to/from stand Pt Will Transfer to Toilet: with mod assist;stand pivot transfer;bedside commode  OT Frequency: Min 2X/week       AM-PAC OT "6 Clicks" Daily Activity     Outcome Measure Help from another person eating meals?: A Little Help from another person taking care of personal  grooming?: A Little Help from another person toileting, which includes using toliet, bedpan, or urinal?: A Lot Help from another person bathing (including washing, rinsing, drying)?: A Lot Help from another person to put on and taking off regular upper body clothing?: A Lot Help from another person to put on and taking off regular lower body clothing?: A Lot 6 Click Score: 14   End of Session Equipment Utilized During Treatment: Rolling walker (2 wheels);Gait belt Nurse Communication: Mobility status  Activity Tolerance: Patient tolerated treatment well Patient left: in bed;with call bell/phone within reach;with bed alarm set;with family/visitor present  OT Visit Diagnosis: Unsteadiness on feet (R26.81);Other abnormalities of gait and mobility (R26.89);Muscle weakness (generalized) (M62.81);Pain                Time: 1049-1110 OT Time Calculation (min): 21 min Charges:  OT General Charges $OT Visit: 1 Visit OT Treatments $Self Care/Home Management : 8-22 mins  Shade Flood, OTR/L [place] Office [phone] Secure Chat Communication Preferred   Elliot Cousin 03/19/2022, 12:38 PM

## 2022-03-19 NOTE — Progress Notes (Signed)
Inpatient Rehab Admissions Coordinator:   I have no beds available for this patient to admit to CIR today.  Will continue to follow for timing of potential admission pending bed availability.   Shann Medal, PT, DPT Admissions Coordinator [phone] 03/19/22  9:50 AM

## 2022-03-19 NOTE — Care Management Important Message (Signed)
Important Message  Patient Details  Name: Bridget [name] MRN: [id number] Date of Birth: 11-18-1921   Medicare Important Message Given:  Yes     Chelcy Bolda [address] 03/19/2022, 3:05 PM

## 2022-03-19 NOTE — Plan of Care (Signed)

## 2022-03-19 NOTE — Progress Notes (Signed)
  Transition of Care [facility]) Screening Note   Patient Details  Name: Bridget [name] Date of Birth: 11-13-1921   [facility]) CM/SW Contact:    Benard Halsted, LCSW Phone Number: 03/19/2022, 2:45 PM    Transition of Care Department [facility]) has reviewed patient and will follow for CIR assessment. We will continue to monitor patient advancement through interdisciplinary progression rounds. If new patient transition needs arise, please place a TOC consult.

## 2022-03-20 ENCOUNTER — Inpatient Hospital Stay (HOSPITAL_COMMUNITY)
Admission: RE | Admit: 2022-03-20 | Discharge: 2022-04-05 | DRG: 057 | Disposition: A | Discharge status: Home Health | Payer: Medicare Other | Source: Intra-hospital | Attending: Physical Medicine & Rehabilitation | Admitting: Physical Medicine & Rehabilitation

## 2022-03-20 DIAGNOSIS — Z888 Allergy status to other drugs, medicaments and biological substances status: Secondary | ICD-10-CM

## 2022-03-20 DIAGNOSIS — Z87891 Personal history of nicotine dependence: Secondary | ICD-10-CM

## 2022-03-20 DIAGNOSIS — Z881 Allergy status to other antibiotic agents status: Secondary | ICD-10-CM

## 2022-03-20 DIAGNOSIS — I2584 Coronary atherosclerosis due to calcified coronary lesion: Secondary | ICD-10-CM | POA: Diagnosis present

## 2022-03-20 DIAGNOSIS — B964 Proteus (mirabilis) (morganii) as the cause of diseases classified elsewhere: Secondary | ICD-10-CM | POA: Diagnosis present

## 2022-03-20 DIAGNOSIS — R29810 Facial weakness: Secondary | ICD-10-CM | POA: Diagnosis present

## 2022-03-20 DIAGNOSIS — I10 Essential (primary) hypertension: Secondary | ICD-10-CM | POA: Diagnosis present

## 2022-03-20 DIAGNOSIS — Z7951 Long term (current) use of inhaled steroids: Secondary | ICD-10-CM

## 2022-03-20 DIAGNOSIS — I6340 Cerebral infarction due to embolism of unspecified cerebral artery: Secondary | ICD-10-CM | POA: Diagnosis present

## 2022-03-20 DIAGNOSIS — I4891 Unspecified atrial fibrillation: Secondary | ICD-10-CM | POA: Diagnosis present

## 2022-03-20 DIAGNOSIS — I69392 Facial weakness following cerebral infarction: Secondary | ICD-10-CM | POA: Diagnosis not present

## 2022-03-20 DIAGNOSIS — Z853 Personal history of malignant neoplasm of breast: Secondary | ICD-10-CM | POA: Diagnosis not present

## 2022-03-20 DIAGNOSIS — J42 Unspecified chronic bronchitis: Secondary | ICD-10-CM | POA: Diagnosis present

## 2022-03-20 DIAGNOSIS — Z885 Allergy status to narcotic agent status: Secondary | ICD-10-CM

## 2022-03-20 DIAGNOSIS — I2510 Atherosclerotic heart disease of native coronary artery without angina pectoris: Secondary | ICD-10-CM | POA: Diagnosis present

## 2022-03-20 DIAGNOSIS — E785 Hyperlipidemia, unspecified: Secondary | ICD-10-CM | POA: Diagnosis present

## 2022-03-20 DIAGNOSIS — G4700 Insomnia, unspecified: Secondary | ICD-10-CM | POA: Diagnosis present

## 2022-03-20 DIAGNOSIS — I639 Cerebral infarction, unspecified: Secondary | ICD-10-CM | POA: Diagnosis not present

## 2022-03-20 DIAGNOSIS — F03B0 Unspecified dementia, moderate, without behavioral disturbance, psychotic disturbance, mood disturbance, and anxiety: Secondary | ICD-10-CM

## 2022-03-20 DIAGNOSIS — K5900 Constipation, unspecified: Secondary | ICD-10-CM | POA: Diagnosis present

## 2022-03-20 DIAGNOSIS — Z7901 Long term (current) use of anticoagulants: Secondary | ICD-10-CM | POA: Diagnosis not present

## 2022-03-20 DIAGNOSIS — I6310 Cerebral infarction due to embolism of unspecified precerebral artery: Secondary | ICD-10-CM

## 2022-03-20 DIAGNOSIS — E119 Type 2 diabetes mellitus without complications: Secondary | ICD-10-CM | POA: Diagnosis present

## 2022-03-20 DIAGNOSIS — Z90710 Acquired absence of both cervix and uterus: Secondary | ICD-10-CM | POA: Diagnosis not present

## 2022-03-20 DIAGNOSIS — N390 Urinary tract infection, site not specified: Secondary | ICD-10-CM | POA: Diagnosis present

## 2022-03-20 DIAGNOSIS — M170 Bilateral primary osteoarthritis of knee: Secondary | ICD-10-CM | POA: Diagnosis present

## 2022-03-20 DIAGNOSIS — R32 Unspecified urinary incontinence: Secondary | ICD-10-CM | POA: Diagnosis present

## 2022-03-20 DIAGNOSIS — K5901 Slow transit constipation: Secondary | ICD-10-CM | POA: Diagnosis not present

## 2022-03-20 DIAGNOSIS — Z79899 Other long term (current) drug therapy: Secondary | ICD-10-CM

## 2022-03-20 MED ORDER — UMECLIDINIUM BROMIDE 62.5 MCG/ACT IN AEPB
1.0000 | INHALATION_SPRAY | Freq: Every day | RESPIRATORY_TRACT | Status: DC
  Administered 2022-03-22 – 2022-03-25 (×4): 1 via RESPIRATORY_TRACT
  Filled 2022-03-20 (×2): qty 7

## 2022-03-20 MED ORDER — ACETAMINOPHEN 325 MG PO TABS
650.0000 mg | ORAL_TABLET | Freq: Four times a day (QID) | ORAL | Status: DC | PRN
  Administered 2022-03-21 – 2022-03-28 (×12): 650 mg via ORAL
  Administered 2022-03-28: 325 mg via ORAL
  Administered 2022-03-28 – 2022-04-03 (×9): 650 mg via ORAL
  Filled 2022-03-20 (×24): qty 2

## 2022-03-20 MED ORDER — POLYETHYLENE GLYCOL 3350 17 G PO PACK
17.0000 g | PACK | Freq: Every day | ORAL | Status: DC | PRN
  Administered 2022-03-21 – 2022-03-30 (×2): 17 g via ORAL
  Filled 2022-03-20 (×2): qty 1

## 2022-03-20 MED ORDER — MOMETASONE FURO-FORMOTEROL FUM 200-5 MCG/ACT IN AERO
2.0000 | INHALATION_SPRAY | Freq: Two times a day (BID) | RESPIRATORY_TRACT | Status: DC
  Administered 2022-03-21 – 2022-03-26 (×11): 2 via RESPIRATORY_TRACT
  Filled 2022-03-20 (×2): qty 8.8

## 2022-03-20 MED ORDER — MEMANTINE HCL 10 MG PO TABS
10.0000 mg | ORAL_TABLET | Freq: Two times a day (BID) | ORAL | Status: DC
  Administered 2022-03-20 – 2022-04-05 (×32): 10 mg via ORAL
  Filled 2022-03-20 (×35): qty 1

## 2022-03-20 MED ORDER — ALBUTEROL SULFATE (2.5 MG/3ML) 0.083% IN NEBU: 2.5000 mg | INHALATION_SOLUTION | Freq: Four times a day (QID) | RESPIRATORY_TRACT | Status: DC | PRN

## 2022-03-20 MED ORDER — ATORVASTATIN CALCIUM 80 MG PO TABS: 80.0000 mg | ORAL_TABLET | Freq: Every day | ORAL | Status: DC

## 2022-03-20 MED ORDER — DILTIAZEM HCL ER COATED BEADS 180 MG PO CP24
360.0000 mg | ORAL_CAPSULE | Freq: Every day | ORAL | Status: DC
  Administered 2022-03-21 – 2022-04-05 (×16): 360 mg via ORAL
  Filled 2022-03-20 (×17): qty 2

## 2022-03-20 MED ORDER — DILTIAZEM HCL ER BEADS 180 MG PO CP24: 360.0000 mg | ORAL_CAPSULE | Freq: Every day | ORAL | Status: DC

## 2022-03-20 MED ORDER — APIXABAN 5 MG PO TABS
5.0000 mg | ORAL_TABLET | Freq: Two times a day (BID) | ORAL | Status: DC
  Administered 2022-03-20: 5 mg via ORAL
  Filled 2022-03-20: qty 1

## 2022-03-20 MED ORDER — APIXABAN 5 MG PO TABS: 5.0000 mg | ORAL_TABLET | Freq: Two times a day (BID) | ORAL | Status: DC

## 2022-03-20 MED ORDER — MELATONIN 3 MG PO TABS: 3.0000 mg | ORAL_TABLET | Freq: Every evening | ORAL | Status: DC | PRN

## 2022-03-20 MED ORDER — ATORVASTATIN CALCIUM 80 MG PO TABS
80.0000 mg | ORAL_TABLET | Freq: Every day | ORAL | Status: DC
  Administered 2022-03-21 – 2022-04-05 (×16): 80 mg via ORAL
  Filled 2022-03-20 (×16): qty 1

## 2022-03-20 MED ORDER — MELATONIN 3 MG PO TABS: 3.0000 mg | ORAL_TABLET | Freq: Every day | ORAL | 0 refills | Status: DC

## 2022-03-20 NOTE — Progress Notes (Signed)
Report called to CIR. 

## 2022-03-20 NOTE — Progress Notes (Signed)
Inpatient Rehab Admissions Coordinator:   I have a bed available for this patient to admit to CIR today. Dr. Sloan Leiter in agreement and [facility] aware.  Will let pt/family know.   Shann Medal, PT, DPT Admissions Coordinator [phone] 03/20/22  9:43 AM

## 2022-03-20 NOTE — H&P (Signed)
Physical Medicine and Rehabilitation Admission H&P        Chief Complaint  Patient presents with   Code Stroke  : HPI: Bridget [name] is a 89 year old right-handed female with history of  moderatdementia maintained on Namenda requiring 24/7 assistance, diet-controlled diabetes mellitus, atrial fibrillation maintained on Eliquis with thrombus of atrial appendage, prior history of CVA with no residual deficits, history of breast cancer with lumpectomy.  Per chart review patient lives with daughter and granddaughter with 24-hour assistance as needed.  Used a rollator at baseline for limited household ambulation.  1 level home with 3 steps to entry.  Presented 03/15/2022 with right-sided facial droop and slurred speech of acute onset.  CT/MRI showed acute infarct in the left posterior frontal cortex including the precentral gyrus.  Additional smaller subacute infarcts in the more anterior left frontal lobe.  Patient did not receive tPA and maintained on Eliquis.  CT angiogram head and neck with no intracranial large vessel occlusion.  No hemodynamically significant stenosis in the neck.  CT of the abdomen/pelvis due to abdominal pain showed no acute intra-abdominal pathology no bowel obstruction.  Admission chemistries unremarkable except sodium 130, chloride 95, urine culture greater 100,000 Proteus Mirabalis.  Echocardiogram ejection fraction of 55 to 60% no wall motion abnormalities.  Neurology follow-up patient remains on Eliquis as prior to admission.  Rocephin was added for UTI.  Tolerating a regular consistency diet.  Therapy evaluations completed due to patient decreased functional mobility/dysarthria was admitted for a comprehensive rehab program.     Per pt's daughter, she forms an Astronomical amount of urine" at night- can soak bed multiple times- she wakes her up time void her at home, however since stroke, daughter emphatic wants purewick.  Usually walks with rollator/prior to stroke; and  does 3 steps with cane- also uses brief at home to void- doesn't know when needs to go.      Review of Systems  Constitutional:  Negative for chills and fever.  HENT:  Positive for hearing loss.   Eyes:  Negative for blurred vision and double vision.  Respiratory:  Negative for cough, shortness of breath and wheezing.   Cardiovascular:  Positive for palpitations. Negative for chest pain and leg swelling.  Gastrointestinal:  Positive for constipation. Negative for heartburn, nausea and vomiting.  Genitourinary:  Negative for dysuria, flank pain and hematuria.  Musculoskeletal:  Positive for joint pain and myalgias.  Skin:  Negative for rash.  Neurological:  Positive for speech change and weakness.  Psychiatric/Behavioral:  Positive for memory loss. The patient has insomnia.   All other systems reviewed and are negative.       Past Medical History:  Diagnosis Date   Atherosclerosis of aorta (HCC)     Atrial fibrillation ([place])     Breast cancer ([place])     Chronic Bronchitis     Coronary atherosclerosis due to calcified coronary lesion     Nocturnal polyuria     Stroke ([facility])     Thrombus of left atrial appendage           Past Surgical History:  Procedure Laterality Date   BREAST LUMPECTOMY Left 2001   FEMUR FRACTURE SURGERY Right 2019    RIGHT - 2015   TOTAL ABDOMINAL HYSTERECTOMY   1967         Family History  Problem Relation Age of Onset   Other Sister          TRAUMA TO HEAD AFTER  A FALL    Social History:  reports that she quit smoking about 74 years ago. Her smoking use included cigarettes. She has a 10.50 pack-year smoking history. She has never used smokeless tobacco. She reports that she does not drink alcohol and does not use drugs. Allergies:       Allergies  Allergen Reactions   Pulmicort [Budesonide] Swelling      Eye swelling, ankle and knee swelling   Erythromycin Other (See Comments)      Unknown, but "all the mycins bother me"   Roxicodone [Oxycodone]  Other (See Comments)      Confusion    Ultram [Tramadol] Other (See Comments)      Confusion           Medications Prior to Admission  Medication Sig Dispense Refill   apixaban (ELIQUIS) 2.5 MG TABS tablet TAKE 1 TABLET BY MOUTH TWICE A DAY 180 tablet 1   Budeson-Glycopyrrol-Formoterol (BREZTRI AEROSPHERE) 160-9-4.8 MCG/ACT AERO Inhale 2 puffs into the lungs in the morning and at bedtime. 10.7 g 5   diltiazem (TIAZAC) 180 MG 24 hr capsule Take 180 mg by mouth daily.       memantine (NAMENDA) 10 MG tablet Take 1 tablet by mouth 2 (two) times daily.       nitrofurantoin (MACRODANTIN) 50 MG capsule Take 50 mg by mouth at bedtime.       OVER THE COUNTER MEDICATION Take 1 each by mouth in the morning. CBD gummy       OVER THE COUNTER MEDICATION Take 1 each by mouth at bedtime. CBD + melatonin gummy       acetaminophen (TYLENOL) 500 MG tablet Take 1,000 mg by mouth daily as needed for mild pain or headache. (Patient not taking: Reported on 03/16/2022)       albuterol (VENTOLIN HFA) 108 (90 Base) MCG/ACT inhaler Inhale 2 puffs into the lungs every 6 (six) hours as needed for shortness of breath or wheezing. (Patient not taking: Reported on 03/16/2022)       B Complex Vitamins (B COMPLEX PO) Take 1 capsule by mouth daily.       benzonatate (TESSALON) 200 MG capsule Take 1 capsule (200 mg total) by mouth 3 (three) times daily as needed. (Patient not taking: Reported on 01/27/2022) 45 capsule 1   Besifloxacin HCl (BESIVANCE) 0.6 % SUSP Place 1 drop into both eyes See admin instructions. Instill 1 drop into each eye 4 times daily for 2 days after eye injections       D-Mannose Paul Dykes) POWD Take 1 Scoop by mouth daily in the afternoon. (Patient not taking: Reported on 03/16/2022)       D-MANNOSE PO Take 2 tablets by mouth 2 (two) times daily. (Patient not taking: Reported on 03/16/2022)       denosumab (PROLIA) 60 MG/ML SOSY injection Inject 60 mg into the skin See admin instructions. Every 6 months (Patient  not taking: Reported on 03/16/2022)       furosemide (LASIX) 20 MG tablet Take 20 mg by mouth daily as needed for edema. (Patient not taking: Reported on 03/16/2022)       ketoconazole (NIZORAL) 2 % shampoo Apply 1 Application topically See admin instructions. Apply and lather on skin and let sit for a few minutes prior to rinsing when you shower (Patient not taking: Reported on 03/16/2022)       Multiple Vitamins-Minerals (PRESERVISION AREDS 2) CAPS Take 1 capsule by mouth in the morning and at bedtime. (Patient not  taking: Reported on 03/16/2022)       Omega-3 Fatty Acids (FISH OIL PO) Take 15 mLs by mouth at bedtime. (Patient not taking: Reported on 03/16/2022)       OVER THE COUNTER MEDICATION Take 1 Scoop by mouth daily. [place] powder supplement (Patient not taking: Reported on 03/16/2022)       Resveratrol 250 MG CAPS Take 1 capsule by mouth 2 (two) times daily. (Patient not taking: Reported on 03/16/2022)       senna (SENOKOT) 8.6 MG tablet Take 1 tablet by mouth daily as needed for constipation. (Patient not taking: Reported on 03/16/2022)       Turmeric (QC TUMERIC COMPLEX PO) Take by mouth. Unsure of dosage (Patient not taking: Reported on 03/16/2022)              Home: Home Living Family/patient expects to be discharged to:: Private residence Living Arrangements: Children Available Help at Discharge: Family, Available 24 hours/day Type of Home: House Home Access: Stairs to enter Technical brewer of Steps: 3 Entrance Stairs-Rails: Left Home Layout: One level Bathroom Shower/Tub: Chiropodist: Standard Bathroom Accessibility: Yes Home Equipment: Rollator (4 wheels), Cane - single point, Grab bars - tub/shower, Grab bars - toilet, Toilet riser, Tub bench   Functional History: Prior Function Prior Level of Function : Needs assist Mobility Comments: Pt uses rollator at baseline for limited household ambulation and SPC + railing for stairs with guarding from  daughter ADLs Comments: assist/supervision for safety at all times with ADL's   Functional Status:  Mobility: Bed Mobility Overal bed mobility: Needs Assistance Bed Mobility: Supine to Sit, Sit to Supine Supine to sit: Mod assist, HOB elevated Sit to supine: Max assist, HOB elevated General bed mobility comments: maximal cues needed to initiate. limited by cog and fear of falling Transfers Overall transfer level: Needs assistance Equipment used: Rolling walker (2 wheels) Transfers: Sit to/from Stand Sit to Stand: Max assist, +2 safety/equipment, +2 physical assistance Bed to/from chair/wheelchair/BSC transfer type:: Squat pivot Squat pivot transfers: Max assist, +2 physical assistance General transfer comment: x5 Ambulation/Gait General Gait Details: unable at this time   ADL: ADL Overall ADL's : Needs assistance/impaired Eating/Feeding: Minimal assistance, Sitting Grooming: Minimal assistance, Sitting Upper Body Bathing: Minimal assistance, Moderate assistance, Sitting Lower Body Bathing: Maximal assistance, +2 for safety/equipment, +2 for physical assistance, Sit to/from stand Upper Body Dressing : Moderate assistance, Sitting Lower Body Dressing: Maximal assistance, +2 for physical assistance, +2 for safety/equipment, Sit to/from stand Lower Body Dressing Details (indicate cue type and reason): doffed and donned underwear Toilet Transfer: Maximal assistance, +2 for safety/equipment, +2 for physical assistance, Rolling walker (2 wheels) Toilet Transfer Details (indicate cue type and reason): simulated EOB Toileting- Clothing Manipulation and Hygiene: Maximal assistance, +2 for physical assistance, Sit to/from stand, +2 for safety/equipment Functional mobility during ADLs: Maximal assistance, +2 for physical assistance, +2 for safety/equipment General ADL Comments: requires constant redirection and cues. limited by R hemi, baseline cog and weakness. only tolerated standing ~5  seconds at a time   Cognition: Cognition Overall Cognitive Status: History of cognitive impairments - at baseline Orientation Level: Oriented to person, Disoriented to place, Disoriented to time, Disoriented to situation Cognition Arousal/Alertness: Awake/alert Behavior During Therapy: WFL for tasks assessed/performed Overall Cognitive Status: History of cognitive impairments - at baseline General Comments: following commands most of the time, moments of agitation but easily redirected   Physical Exam: Blood pressure 119/71, pulse 61, temperature 98 F (36.7 C), temperature source Oral, resp.  rate 13, height '5\' 7"'$  (1.702 m), weight 62.8 kg, SpO2 94 %. Physical Exam Vitals and nursing note reviewed. Exam conducted with a chaperone present.  Constitutional:      Comments: Laying supine in bed; nursing cleaned up from small BM and urinary accident; daughter at bedside; appears stated age- awake, following 1 step commands, NAD  HENT:     Head: Normocephalic.     Comments: Mild R facial droop- clears with smile Tongue midline Sensation intact    Right Ear: External ear normal.     Left Ear: External ear normal.     Ears:     Comments: Hearing aid on L only- cannot tolerate R    Nose: Nose normal. No congestion.     Mouth/Throat:     Mouth: Mucous membranes are dry.     Pharynx: Oropharynx is clear. No oropharyngeal exudate.  Eyes:     General:        Right eye: No discharge.        Left eye: No discharge.     Extraocular Movements: Extraocular movements intact.     Comments: However R eye upgoing vertical nystagmus noted  Cardiovascular:     Rate and Rhythm: Normal rate. Rhythm irregular.     Heart sounds: Normal heart sounds. No murmur heard.    No gallop.     Comments: Rate 70s-90s Pulmonary:     Effort: Pulmonary effort is normal. No respiratory distress.     Breath sounds: Normal breath sounds. No wheezing, rhonchi or rales.  Abdominal:     Comments: Distended; a little  firm; NT; slightly hypoactive BS  Genitourinary:    General: Normal vulva.     Vagina: No vaginal discharge.     Comments: Purewick put back in after incontinence Musculoskeletal:     Cervical back: Neck supple. No tenderness.     Comments: RUE- biceps and triceps 4/5; Grip and FA 2/5; couldn't get to do WE LUE 4+/5 except FA 3+/5 RLE_ HF 2/5; KE/KF 2/5, DF 4-/5 and PF 4-/5 LLE- 4+/5 in same muscles tested  Skin:    General: Skin is warm and dry.     Comments: No LE edema seen IV R forearm- looks a little irritated but OK Skin ok otherwise- assessed when they changed her  Neurological:     Comments: Patient is alert and made eye contact with examiner.  Provides name and date of birth but not month or year.  She does follow simple commands. Ox1- but ,made simple conversation- tired Intact to light touch in all 4 extremities per pt- says feels the same   Psychiatric:     Comments: Fatigued, not a lot of spontaneous speech        Lab Results Last 48 Hours  No results found for this or any previous visit (from the past 48 hour(s)).   Imaging Results (Last 48 hours)  No results found.         Blood pressure 119/71, pulse 61, temperature 98 F (36.7 C), temperature source Oral, resp. rate 13, height '5\' 7"'$  (1.702 m), weight 62.8 kg, SpO2 94 %.   Medical Problem List and Plan: 1. Functional deficits secondary to acute infarcts in the left precentral gyrus and left posterior frontal lobe likely embolic in the setting of A-fib             -patient may  shower             -ELOS/Goals: 12-14 days  min A 2.  Antithrombotics: -DVT/anticoagulation:  Pharmaceutical: Eliquis             -antiplatelet therapy: N/A 3. Pain Management: Tylenol as needed 4. Mood/Behavior/Sleep: Namenda 10 mg twice daily, melatonin QHS             -antipsychotic agents: N/A 5. Neuropsych/cognition: This patient is not capable of making decisions on her own behalf. Has moderate dementia 6. Skin/Wound Care:  Routine skin checks 7. Fluids/Electrolytes/Nutrition: Routine in and outs with follow-up chemistries 8.  Atrial fibrillation with RVR LAA thrombus.  Continue Eliquis, Cardizem 360 mg daily.  Cardiac rate controlled 9.  UTI/Proteus Mirabalis.  Completed  course of Rocephin 10.  Hyperlipidemia.  Lipitor 11.  Essential hypertension.  Continue Cardizem as directed 12.  Diabetes mellitus.  Diet controlled at baseline.  Hemoglobin A1c 5.6. 13.  Chronic bronchitis.  Continue inhalers as indicated 14. Urinary incontinence- daughter wants purewick- I think due to her issues, that's reasonable and timed voiding q4 hours during day- since cannot get her OOB easily at night due to her stroke- hopefully will improve. 15. Constipation with incontinence- suggest Sorbitol once seen therapy, so can get her on [place], PA-C 03/20/2022   I have personally performed a face to face diagnostic evaluation of this patient and formulated the key components of the plan.  Additionally, I have personally reviewed laboratory data, imaging studies, as well as relevant notes and concur with the physician assistant's documentation above.   The patient's status has not changed from the original H&P.  Any changes in documentation from the acute care chart have been noted above.

## 2022-03-20 NOTE — Progress Notes (Signed)
PMR Admission Coordinator Pre-Admission Assessment   Patient: Bridget [name] is an 89 y.o., female MRN: [id number] DOB: 07/21/1921 Height: '5\' 7"'$  (170.2 cm) Weight: 62.8 kg   Insurance Information HMO:     PPO:      PCP:      IPA:      80/20:      OTHER:  PRIMARY: Medicare A/B      Policy#: [id number]      Subscriber: pt CM Name:       Phone#:      Fax#:  Pre-Cert#: verified Civil engineer, contracting:  Benefits:  Phone #:      Name:  Eff. Date: 11/06/88 A/B     Deduct: $1632      Out of Pocket Max: n/a      Life Max: n/a CIR: 100%      SNF: 20 full days Outpatient: 80%     Co-Pay: 20% Home Health: 100%      Co-Pay:  DME: 80%     Co-Pay: 20% Providers:  SECONDARY: BCBS       Policy#: [id number]     Phone#: [phone]   Financial Counselor:       Phone#:    The "Data Collection Information Summary" for patients in Inpatient Rehabilitation Facilities with attached "Privacy Act [place] Records" was provided and verbally reviewed with: Patient and Family   Emergency Contact Information Contact Information       Name Relation Home Work Mobile    causey,jacque Daughter [phone]   [phone]    Ermalene Searing Daughter [phone]   [phone]    Dipali, Basquez [phone]   [phone]           Current Medical History  Patient Admitting Diagnosis: CVA   History of Present Illness: Bridget [name] is a 89 year old right-handed female with history of dementia maintained on Namenda, requiring 24/7 assistance, diet-controlled diabetes mellitus, atrial fibrillation maintained on Eliquis with thrombus of atrial appendage, prior history of CVA with no residual deficits, history of breast cancer with lumpectomy.  Presented 03/15/2022 with right-sided facial droop and slurred speech of acute onset.  CT/MRI showed acute infarct in the left posterior frontal cortex including the precentral gyrus.  Additional smaller subacute infarcts in the more anterior left frontal lobe.   Patient did not receive tPA and maintained on Eliquis.  CT angiogram head and neck with no intracranial large vessel occlusion.  No hemodynamically significant stenosis in the neck.  CT of the abdomen/pelvis due to abdominal pain showed no acute intra-abdominal pathology no bowel obstruction.  Admission chemistries unremarkable except sodium 130, chloride 95, urine culture greater 100,000 Proteus Mirabalis.  Echocardiogram ejection fraction of 55 to 60% no wall motion abnormalities.  Neurology follow-up patient remains on Eliquis as prior to admission.  Rocephin was added for UTI.  Tolerating a regular consistency diet.  Therapy evaluations completed and pt was recommended for a comprehensive rehab program.    Complete NIHSS TOTAL: 2   [facility]'s medical record [facility] has been reviewed by the rehabilitation admission coordinator and physician.   Past Medical History      Past Medical History:  Diagnosis Date   Atherosclerosis of aorta (HCC)     Atrial fibrillation (Bridget [name])     Breast cancer (Bridget [name])     Chronic Bronchitis     Coronary atherosclerosis due to calcified coronary lesion     Nocturnal polyuria     Stroke (Bridget [name])  Thrombus of left atrial appendage        Has the patient had major surgery during 100 days prior to admission? No   Family History   family history includes Other in her sister.   Current Medications   Current Facility-Administered Medications:     stroke: early stages of recovery book, , Does not apply, Once, Leonie Man, Pramod S, MD   acetaminophen (TYLENOL) tablet 650 mg, 650 mg, Oral, Q6H PRN, Kayleen Memos, DO, 650 mg at 03/17/22 2047   albuterol (PROVENTIL) (2.5 MG/3ML) 0.083% nebulizer solution 2.5 mg, 2.5 mg, Inhalation, Q6H PRN, [place] Crane, Carole N, DO   apixaban (ELIQUIS) tablet 5 mg, 5 mg, Oral, BID, Reeves Forth, Velna Ochs, MD, 5 mg at 03/18/22 0849   atorvastatin (LIPITOR) tablet 80 mg, 80 mg, Oral, Daily, Irene Pap N, DO, 80 mg at 03/18/22 0849   cefTRIAXone  (ROCEPHIN) 2 g in sodium chloride 0.9 % 100 mL IVPB, 2 g, Intravenous, Q24H, [place] Bridget Poag, DO, Last Rate: 200 mL/hr at 03/18/22 0854, 2 g at 03/18/22 0854   diltiazem (CARDIZEM CD) 24 hr capsule 360 mg, 360 mg, Oral, Daily, Tolia, Sunit, DO, 360 mg at 03/18/22 0849   labetalol (NORMODYNE) injection 5 mg, 5 mg, Intravenous, Q2H PRN, Hall, Carole N, DO   melatonin tablet 3 mg, 3 mg, Oral, QHS PRN, Hall, Carole N, DO   melatonin tablet 3 mg, 3 mg, Oral, QHS, [place] Eric J, DO, 3 mg at 03/17/22 2048   memantine (NAMENDA) tablet 10 mg, 10 mg, Oral, BID, Hall, Carole N, DO, 10 mg at 03/18/22 0849   mometasone-formoterol (DULERA) 200-5 MCG/ACT inhaler 2 puff, 2 puff, Inhalation, BID, 2 puff at 03/18/22 0852 **AND** umeclidinium bromide (INCRUSE ELLIPTA) 62.5 MCG/ACT 1 puff, 1 puff, Inhalation, Daily, [place] Eric J, DO   polyethylene glycol (MIRALAX / GLYCOLAX) packet 17 g, 17 g, Oral, Daily PRN, Hall, Carole N, DO   prochlorperazine (COMPAZINE) injection 5 mg, 5 mg, Intravenous, Q6H PRN, Irene Pap N, DO   Patients Current Diet:  Diet Order                  Diet regular Room service appropriate? Yes; Fluid consistency: Thin  Diet effective now                         Precautions / Restrictions Precautions Precautions: Fall Precaution Comments: watch HR Restrictions Weight Bearing Restrictions: No    Has the patient had 2 or more falls or a fall with injury in the past year? No   Prior Activity Level Household: limited household ambulation, supervision with rollator for mobility, assist for ADLs, hx of dementia   Prior Functional Level Self Care: Did the patient need help bathing, dressing, using the toilet or eating? Needed some help   Indoor Mobility: Did the patient need assistance with walking from room to room (with or without device)? Needed some help   Stairs: Did the patient need assistance with internal or external stairs (with or without device)? Needed some help    Functional Cognition: Did the patient need help planning regular tasks such as shopping or remembering to take medications? Dependent   Patient Information Are you of Hispanic, Latino/a,or Spanish origin?: A. No, not of Hispanic, Latino/a, or Spanish origin, X. Patient unable to respond (proxy) What is your race?: A. White, X. Patient unable to respond (proxy) Do you need or want an interpreter to communicate with a doctor or health  care staff?: 9. Unable to respond   Patient's Response To:  Health Literacy and Transportation Is the patient able to respond to health literacy and transportation needs?: No Health Literacy - How often do you need to have someone help you when you read instructions, pamphlets, or other written material from your doctor or pharmacy?: Patient unable to respond   [place] / Equipment Home Equipment: Rollator (4 wheels), Cane - single point, Grab bars - tub/shower, Grab bars - toilet, Toilet riser, Tub bench   Prior Device Use: Indicate devices/aids used by the patient prior to current illness, exacerbation or injury? Walker   Current Functional Level Cognition   Overall Cognitive Status: History of cognitive impairments - at baseline Orientation Level: Oriented to person, Oriented to place, Disoriented to time, Disoriented to situation General Comments: Daughter at bedside, reporting better night of sleep. Pt able to state her name and reports her daughter is her mother, which the daughter says is normal for her to say. Pt less agitated and more participatory and pleasant today    Extremity Assessment (includes Sensation/Coordination)   Upper Extremity Assessment: RUE deficits/detail RUE Deficits / Details: grossly 2/5, difficult to fully assess due to impaired cognition RUE Sensation: decreased light touch, decreased proprioception RUE Coordination: decreased fine motor, decreased gross motor  Lower Extremity Assessment: Defer to PT  evaluation RLE Deficits / Details: Grossly 2/5     ADLs   Overall ADL's : Needs assistance/impaired Eating/Feeding: Minimal assistance, Sitting Grooming: Minimal assistance, Sitting Upper Body Bathing: Minimal assistance, Moderate assistance, Sitting Lower Body Bathing: Maximal assistance, +2 for safety/equipment, +2 for physical assistance, Sit to/from stand Upper Body Dressing : Moderate assistance, Sitting Lower Body Dressing: Maximal assistance, +2 for physical assistance, +2 for safety/equipment, Sit to/from stand Toilet Transfer: Maximal assistance, +2 for physical assistance, With caregiver independent assisting, Stand-pivot Toileting- Clothing Manipulation and Hygiene: Maximal assistance, +2 for physical assistance, Sit to/from stand, +2 for safety/equipment Functional mobility during ADLs: Maximal assistance, +2 for physical assistance, +2 for safety/equipment General ADL Comments: limited by R hemi deficits, baseline dementia and generalized weakness     Mobility   Overal bed mobility: Needs Assistance Bed Mobility: Supine to Sit, Sit to Supine Supine to sit: Mod assist, HOB elevated Sit to supine: Max assist, HOB elevated General bed mobility comments: Assist for BLE management and trunk support with use of bed rail, pt initiating with supine>sit able to pull on therapist to assist into sitting, maxA required for trunk and BLE to return to supine     Transfers   Overall transfer level: Needs assistance Equipment used: 2 person hand held assist Transfers: Sit to/from Stand Sit to Stand: Max assist, +2 physical assistance General transfer comment: pt declining any attempts with increased encouragement     Ambulation / Gait / Stairs / Proofreader / Balance Dynamic Sitting Balance Sitting balance - Comments: pt with BUE in lap, requiring min-modA to maintain sitting balance but brief moments of minG and use of trunk to correct posterior  lean Balance Overall balance assessment: Needs assistance Sitting-balance support: Feet supported, No upper extremity supported Sitting balance-Leahy Scale: Poor Sitting balance - Comments: pt with BUE in lap, requiring min-modA to maintain sitting balance but brief moments of minG and use of trunk to correct posterior lean Postural control: Posterior lean Standing balance support: Bilateral upper extremity supported Standing balance-Leahy Scale: Poor Standing balance comment: reliant on external support  Special needs/care consideration Behavioral consideration dementia    Previous Home Environment (from acute therapy documentation) Living Arrangements: Children Available Help at Discharge: Family, Available 24 hours/day Type of Home: House Home Layout: One level Home Access: Stairs to enter Entrance Stairs-Rails: Left Entrance Stairs-Number of Steps: 3 Bathroom Shower/Tub: Government social research officer Accessibility: Yes   Discharge Living Setting Plans for Discharge Living Setting: Lives with (comment) (daughter, Kennyth Lose) Type of Home at Discharge: House Discharge Home Layout: One level Discharge Home Access: Stairs to enter Entrance Stairs-Rails: Left Entrance Stairs-Number of Steps: 3 Discharge Bathroom Shower/Tub: Tub/shower unit Discharge Bathroom Toilet: Standard Discharge Bathroom Accessibility: Yes How Accessible: Accessible via walker Does the patient have any problems obtaining your medications?: No   Social/Family/Support Systems Anticipated Caregiver: daughter, Kennyth Lose, is primary caregiver and contact Anticipated Caregiver's Contact Information: [phone] Ability/Limitations of Caregiver: min assist mobility/ADLs Caregiver Availability: 24/7 Discharge Plan Discussed with Primary Caregiver: Yes Is Caregiver In Agreement with Plan?: Yes Does Caregiver/Family have Issues with Lodging/Transportation while Pt is in Rehab?: No    Goals Patient/Family Goal for Rehab: PT/OT min assist, SLP n/a Expected length of stay: 12-14 days Additional Information: Discharge plan: discharge to daughter's home with Kennyth Lose providing 24/7 assist, can d/c to SNF if needed, has 20 SNF days remaining Pt/Family Agrees to Admission and willing to participate: Yes Program Orientation Provided & Reviewed with Pt/Caregiver Including Roles  & Responsibilities: Yes  Barriers to Discharge: Home environment access/layout, Decreased caregiver support   Decrease burden of Care through IP rehab admission: n/a   Possible need for SNF placement upon discharge: Potentially.  Pt's daughter can provide up to min assist for mobility and ADLs and 24/7 care.  If pt unable to reach this level may need short term SNF.    Patient Condition: I have reviewed medical records from [facility], spoken with CSW, and patient and daughter. I met with patient at the bedside for inpatient rehabilitation assessment.  Patient will benefit from ongoing PT and OT, can actively participate in 3 hours of therapy a day 5 days of the week, and can make measurable gains during the admission.  Patient will also benefit from the coordinated team approach during an Inpatient Acute Rehabilitation admission.  The patient will receive intensive therapy as well as Rehabilitation physician, nursing, social worker, and care management interventions.  Due to bladder management, bowel management, safety, skin/wound care, disease management, medication administration, pain management, and patient education the patient requires 24 hour a day rehabilitation nursing.  The patient is currently max assist with mobility and basic ADLs.  Discharge setting and therapy post discharge at  home  is anticipated.  Patient has agreed to participate in the Acute Inpatient Rehabilitation Program and will admit today.   Preadmission Screen Completed By:  Michel Santee, PT, DPT 03/18/2022 2:50  PM ______________________________________________________________________   Discussed status with Dr. Dagoberto Ligas on 03/20/22  at 9:43 AM  and received approval for admission today.   Admission Coordinator:  Michel Santee, PT, DPT time 2:59 PM Sudie Grumbling 03/18/22     Assessment/Plan: Diagnosis: Does the need for close, 24 hr/day Medical supervision in concert with the patient's rehab needs make it unreasonable for this patient to be served in a less intensive setting? Yes Co-Morbidities requiring supervision/potential complications: Afib, diet DM, CVA with L frontal stroke; dementia Due to bladder management, bowel management, safety, skin/wound care, disease management, medication administration, pain management, and patient education, does the patient require 24 hr/day rehab nursing? Yes  Does the patient require coordinated care of a physician, rehab nurse, PT, OT, and SLP to address physical and functional deficits in the context of the above medical diagnosis(es)? Yes Addressing deficits in the following areas: balance, endurance, locomotion, strength, transferring, bowel/bladder control, bathing, dressing, feeding, grooming, toileting, and cognition Can the patient actively participate in an intensive therapy program of at least 3 hrs of therapy 5 days a week? Yes The potential for patient to make measurable gains while on inpatient rehab is good and fair Anticipated functional outcomes upon discharge from inpatient rehab: min assist PT, min assist OT, n/a SLP Estimated rehab length of stay to reach the above functional goals is: 12-14 days Anticipated discharge destination: Home 10. Overall Rehab/Functional Prognosis: good and fair     MD Signature:

## 2022-03-20 NOTE — TOC Transition Note (Signed)
Transition of Care [facility]) - CM/SW Discharge Note   Patient Details  Name: Bridget [name] MRN: [id number] Date of Birth: 05/16/1921  [facility] CM/SW Contact:  Benard Halsted, LCSW Phone Number: 03/20/2022, 10:43 AM   Clinical Narrative:    Patient discharging to CIR today. Family at bedside. No other needs identified.    Final next level of care: IP Rehab Facility Barriers to Discharge: Barriers Resolved   Patient Goals and CMS Choice CMS Medicare.gov Compare Post Acute Care list provided to:: Patient Represenative (must comment) Choice offered to / list presented to : Adult Children  Discharge Placement                      Patient and family notified of of transfer: 03/20/22  Discharge Plan and Services Additional resources added to the After Visit Summary for   In-house Referral: Clinical Social Work   Post Acute Care Choice: IP Rehab                               Social Determinants of Health (SDOH) Interventions SDOH Screenings   Tobacco Use: Medium Risk (02/02/2022)     Readmission Risk Interventions     No data to display

## 2022-03-20 NOTE — Progress Notes (Signed)
Physical Therapy Treatment Patient Details Name: Bridget [name] MRN: [id number] DOB: April 19, 1921 Today's Date: 03/20/2022   History of Present Illness Bridget [name] is a 89 y.o. female who presents 03/15/2022 who presented to [facility] ED from home as a code stroke with right facial drooling and dysarthria. Acute infarcts in the left posterior frontal cortex, including the  precentral gyrus.  UA was positive for pyuri. Medical history significant for dementia requiring 24/7 assistance, permanent atrial fibrillation on Eliquis, thrombus of atrial appendage, prior history of stroke with no residual deficits, history of breast cancer status post lumpectomy, osteoporosis and bronchiectasis.    PT Comments    Pt tolerated today's session well, continuing to progress with balance and mobility. Pt noted increased active movement on RUE and RLE, with pt adjusting RUE on her lap and ensuring it is in a comfortable position throughout session. Noted improved seated balance from pt, tolerating sitting EOB well with close guard provided but pt able to self-correct posterior leans with intermittent cueing for upright posture. Pt reports feeling as if she was falling forward but provided education on proper alignment. Pt able to reach forward to targets with RUE, with cueing to lean outside of BOS, pt able to complete. Pt stood x3 trials today with maxA and B knee blocking, first trial pt unable to obtain full upright posture but with cueing for forward gaze and hip extension, pt able to fully stand on the last two trials. Acute PT will continue to follow up with pt to progress mobility during admission, discharge plans remain appropriate.     Recommendations for follow up therapy are one component of a multi-disciplinary discharge planning process, led by the attending physician.  Recommendations may be updated based on patient status, additional functional criteria and insurance authorization.  Follow Up Recommendations   Acute inpatient rehab (3hours/day)     Assistance Recommended at Discharge Frequent or constant Supervision/Assistance  Patient can return home with the following Two people to help with walking and/or transfers;A lot of help with bathing/dressing/bathroom;Assist for transportation;Help with stairs or ramp for entrance   Equipment Recommendations  Other (comment) (defer to next level)    Recommendations for Other Services Rehab consult     Precautions / Restrictions Precautions Precautions: Fall Precaution Comments: watch HR Restrictions Weight Bearing Restrictions: No     Mobility  Bed Mobility Overal bed mobility: Needs Assistance Bed Mobility: Supine to Sit, Sit to Supine     Supine to sit: Mod assist, HOB elevated Sit to supine: Max assist, HOB elevated   General bed mobility comments: cueing required to initiate BLE to EOB, assist for BLE management and trunk support, maxA needed to return to bed with management of BLE and trunk, assist to pivot hips to EOB    Transfers Overall transfer level: Needs assistance Equipment used: 1 person hand held assist Transfers: Sit to/from Stand             General transfer comment: stood x3 trials with maxA, cueing for forward gaze and hip extension, progressing with each trial    Ambulation/Gait               General Gait Details: unable at this time   Stairs             Wheelchair Mobility    Modified Rankin (Stroke Patients Only) Modified Rankin (Stroke Patients Only) Pre-Morbid Rankin Score: Moderately severe disability Modified Rankin: Severe disability     Balance Overall balance assessment: Needs assistance  Sitting-balance support: Single extremity supported, Feet supported Sitting balance-Leahy Scale: Fair Sitting balance - Comments: close guard provided but pt toleraing sitting EOB well Postural control: Posterior lean Standing balance support: Bilateral upper extremity supported, During  functional activity Standing balance-Leahy Scale: Poor Standing balance comment: requiring assist to maintain static standing                            Cognition Arousal/Alertness: Awake/alert Behavior During Therapy: WFL for tasks assessed/performed Overall Cognitive Status: History of cognitive impairments - at baseline                                 General Comments: following commands with increased time and occasional cueing, family at bedside        Exercises      General Comments General comments (skin integrity, edema, etc.): VSS throughout on room air      Pertinent Vitals/Pain Pain Assessment Pain Assessment: No/denies pain    Home Living                          Prior Function            PT Goals (current goals can now be found in the care plan section) Acute Rehab PT Goals Patient Stated Goal: get better PT Goal Formulation: With patient Time For Goal Achievement: 03/30/22 Potential to Achieve Goals: Good Progress towards PT goals: Progressing toward goals    Frequency    Min 4X/week      PT Plan Current plan remains appropriate    Co-evaluation              AM-PAC PT "6 Clicks" Mobility   Outcome Measure  Help needed turning from your back to your side while in a flat bed without using bedrails?: A Lot Help needed moving from lying on your back to sitting on the side of a flat bed without using bedrails?: A Lot Help needed moving to and from a bed to a chair (including a wheelchair)?: Total Help needed standing up from a chair using your arms (e.g., wheelchair or bedside chair)?: A Lot Help needed to walk in hospital room?: Total Help needed climbing 3-5 steps with a railing? : Total 6 Click Score: 9    End of Session Equipment Utilized During Treatment: Gait belt Activity Tolerance: Patient tolerated treatment well Patient left: in bed;with call bell/phone within reach;with bed alarm set;with  family/visitor present Nurse Communication: Mobility status PT Visit Diagnosis: Unsteadiness on feet (R26.81);Hemiplegia and hemiparesis Hemiplegia - Right/Left: Right Hemiplegia - dominant/non-dominant: Dominant Hemiplegia - caused by: Cerebral infarction     Time: [id number] PT Time Calculation (min) (ACUTE ONLY): 33 min  Charges:  $Therapeutic Activity: 8-22 mins $Neuromuscular Re-education: 8-22 mins                     Charlynne Cousins, PT DPT Acute Rehabilitation Services Office [phone]    Luvenia Heller 03/20/2022, 12:47 PM

## 2022-03-20 NOTE — Discharge Summary (Signed)
PATIENT DETAILS Name: Bridget [name] Age: 89 y.o. Sex: female Date of Birth: Dec 05, 1921 MRN: [id number]. Admitting Physician: Kayleen Memos, DO [id number], Fransico Him, MD  Admit Date: 03/15/2022 Discharge date: 03/20/2022  Recommendations for Outpatient Follow-up:  Follow up with PCP in 1-2 weeks Please obtain CMP/CBC in one week Please ensure follow-up with stroke clinic.  Admitted From:  Home  Disposition: CIR   Discharge Condition: good  CODE STATUS:   Code Status: DNR   Diet recommendation:  Diet Order             Diet - low sodium heart healthy           Diet regular Room service appropriate? Yes; Fluid consistency: Thin  Diet effective now                    Brief Summary: Patient is a 89 y.o.  female with history of A-fib, CAD, dementia who presented with right-sided weakness/slurred speech-further evaluation confirmed acute CVA    Significant events: 3/9>> admit to [facility]   Significant studies: 3/9>> CT angio head/neck: No LVO, severe stenosis in the left A2 segment.  No significant stenosis in the neck. 3/10>> MRI brain: Acute infarct in the left posterior frontal cortex, additional small acute infarcts in the anterior left frontal lobe 3/10>> LDL: 154 3/10>> A1c 5.6 3/10>> echo: EF 55-60% 3/10>> CT abdomen/pelvis: No acute pathology-3.2 centimeter right ovarian cyst.   Significant microbiology data: 3/9>> urine culture: Proteus   Procedures: None   Consults: Cardiology Neurology  Brief Hospital Course: Acute CVA Continues to have right-sided weakness Thought to be embolic-in spite of patient being on Eliquis. Workup as above Neurology recommending we continue Eliquis-as there is no definitive data suggesting switching to Xarelto or Pradaxa is necessarily better.  Per neurology note-she is not a candidate for Watchman device Will to be transferred to CIR when bed available.   Permanent atrial fibrillation with RVR Known L LAA  thrombus Rate controlled with Cardizem On Eliquis Cardiology consulted during this hospitalization   Proteus UTI Completed antibiotic course on 3/13   HTN  BP stable Continue Cardizem   HLD On high intensity statin   DM-2 Diet controlled   Dementia Minimally-pleasantly confused this morning-great granddaughter at bedside. Maintain delirium precautions Resume Namenda on d/c  3.2 cm right ovarian cyst Incidental finding on CT abdomen Given advanced age-not sure if this is necessary to be followed in the outpatient setting but will defer to PCP.    BMI: Estimated body mass index is 21.68 kg/m as calculated from the following:   Height as of this encounter: '5\' 7"'$  (1.702 m).   Weight as of this encounter: 62.8 kg.   Discharge Diagnoses:  Principal Problem:   CVA (cerebral vascular accident) [facility]) Active Problems:   Coronary artery calcification seen on computed tomography   Dementia (HCC)   Atrial fibrillation with rapid ventricular response (HCC)   Permanent atrial fibrillation (HCC)   Current use of long term anticoagulation   Abnormality of left atrial appendage   Atherosclerosis of aorta (HCC)   Former smoker   Pure hypercholesterolemia   Acute CVA (cerebrovascular accident) [facility])   Discharge Instructions:  Activity:  As tolerated with Full fall precautions use walker/cane & assistance as needed   Discharge Instructions     Ambulatory referral to Neurology   Complete by: As directed    An appointment is requested in approximately: 8 weeks   Diet - low sodium heart healthy  Complete by: As directed    Increase activity slowly   Complete by: As directed       Allergies as of 03/20/2022       Reactions   Pulmicort [budesonide] Swelling   Eye swelling, ankle and knee swelling   Erythromycin Other (See Comments)   Unknown, but "all the mycins bother me"   Roxicodone [oxycodone] Other (See Comments)   Confusion    Ultram [tramadol] Other (See  Comments)   Confusion         Medication List     STOP taking these medications    benzonatate 200 MG capsule Commonly known as: TESSALON   Besivance 0.6 % Susp Generic drug: Besifloxacin HCl   D-MANNOSE PO   FISH OIL PO   ketoconazole 2 % shampoo Commonly known as: NIZORAL   nitrofurantoin 50 MG capsule Commonly known as: MACRODANTIN   OVER THE COUNTER MEDICATION   OVER THE COUNTER MEDICATION   OVER THE COUNTER MEDICATION   PreserVision AREDS 2 Caps   QC TUMERIC COMPLEX PO   Resveratrol 250 MG Caps   UritraX Powd       TAKE these medications    acetaminophen 500 MG tablet Commonly known as: TYLENOL Take 1,000 mg by mouth daily as needed for mild pain or headache.   albuterol 108 (90 Base) MCG/ACT inhaler Commonly known as: VENTOLIN HFA Inhale 2 puffs into the lungs every 6 (six) hours as needed for shortness of breath or wheezing.   apixaban 5 MG Tabs tablet Commonly known as: Eliquis Take 1 tablet (5 mg total) by mouth 2 (two) times daily. What changed:  medication strength how much to take   atorvastatin 80 MG tablet Commonly known as: LIPITOR Take 1 tablet (80 mg total) by mouth daily. Start taking on: March 21, 2022   B COMPLEX PO Take 1 capsule by mouth daily.   Breztri Aerosphere 160-9-4.8 MCG/ACT Aero Generic drug: Budeson-Glycopyrrol-Formoterol Inhale 2 puffs into the lungs in the morning and at bedtime.   diltiazem 180 MG 24 hr capsule Commonly known as: TIAZAC Take 2 capsules (360 mg total) by mouth daily. What changed: how much to take   furosemide 20 MG tablet Commonly known as: LASIX Take 20 mg by mouth daily as needed for edema.   melatonin 3 MG Tabs tablet Take 1 tablet (3 mg total) by mouth at bedtime.   memantine 10 MG tablet Commonly known as: NAMENDA Take 1 tablet by mouth 2 (two) times daily.   Prolia 60 MG/ML Sosy injection Generic drug: denosumab Inject 60 mg into the skin See admin instructions. Every 6  months   senna 8.6 MG tablet Commonly known as: SENOKOT Take 1 tablet by mouth daily as needed for constipation.        Follow-up Information     Rex Kras, DO Follow up on 04/21/2022.   Specialties: Cardiology, Vascular Surgery Why: 2:30 PM Contact information: [place] [address] [phone]         Garvin Fila, MD. Schedule an appointment as soon as possible for a visit in 8 week(s).   Specialties: Neurology, Radiology Contact information: [address] [place] [address] [phone]         Tisovec, Fransico Him, MD. Schedule an appointment as soon as possible for a visit in 1 week(s).   Specialty: Internal Medicine Contact information: [address] [place] [address] [phone]  Allergies  Allergen Reactions   Pulmicort [Budesonide] Swelling    Eye swelling, ankle and knee swelling   Erythromycin Other (See Comments)    Unknown, but "all the mycins bother me"   Roxicodone [Oxycodone] Other (See Comments)    Confusion    Ultram [Tramadol] Other (See Comments)    Confusion      Other Procedures/Studies: ECHOCARDIOGRAM COMPLETE BUBBLE STUDY  Result Date: 03/16/2022    ECHOCARDIOGRAM REPORT   Patient Name:   Bridget [name] Date of Exam: 03/16/2022 Medical Rec #:  [id number]    Height:       67.0 in Accession #:    [id number]   Weight:       138.4 lb Date of Birth:  1921/12/07    BSA:          1.730 m Patient Age:    37 years     BP:           159/83 mmHg Patient Gender: F            HR:           102 bpm. Exam Location:  Inpatient Procedure: 2D Echo, Cardiac Doppler and Color Doppler Indications:    Stroke 434.91 / I63.9  History:        Patient has prior history of Echocardiogram examinations, most                 recent 09/20/2020. Stroke; Risk Factors:Former Smoker.  Sonographer:    Wilkie Aye RVT RCS Referring Phys: [id number] [place]  1. Left ventricular ejection  fraction, by estimation, is 55 to 60%. The left ventricle has normal function. The left ventricle has no regional wall motion abnormalities. Left ventricular diastolic parameters are indeterminate.  2. Right ventricular systolic function is normal. The right ventricular size is mildly enlarged. There is mildly elevated pulmonary artery systolic pressure.  3. Left atrial size was severely dilated.  4. Right atrial size was severely dilated.  5. The mitral valve is abnormal. Mild mitral valve regurgitation. No evidence of mitral stenosis.  6. The tricuspid valve is abnormal. Tricuspid valve regurgitation is moderate to severe.  7. The aortic valve was not well visualized. There is severe calcifcation of the aortic valve. There is severe thickening of the aortic valve. Aortic valve regurgitation is moderate. No aortic stenosis is present.  8. Agitated saline contrast bubble study was negative, with no evidence of any interatrial shunt. FINDINGS  Left Ventricle: Left ventricular ejection fraction, by estimation, is 55 to 60%. The left ventricle has normal function. The left ventricle has no regional wall motion abnormalities. The left ventricular internal cavity size was normal in size. There is  no left ventricular hypertrophy. Left ventricular diastolic parameters are indeterminate. Right Ventricle: The right ventricular size is mildly enlarged. Right vetricular wall thickness was not well visualized. Right ventricular systolic function is normal. There is mildly elevated pulmonary artery systolic pressure. The tricuspid regurgitant  velocity is 2.97 m/s, and with an assumed right atrial pressure of 3 mmHg, the estimated right ventricular systolic pressure is 0000000 mmHg. Left Atrium: Left atrial size was severely dilated. Right Atrium: Right atrial size was severely dilated. Pericardium: There is no evidence of pericardial effusion. Mitral Valve: The mitral valve is abnormal. Mild mitral valve regurgitation. No  evidence of mitral valve stenosis. Tricuspid Valve: The tricuspid valve is abnormal. Tricuspid valve regurgitation is moderate to severe. No evidence of tricuspid stenosis. Aortic Valve: The aortic valve  was not well visualized. There is severe calcifcation of the aortic valve. There is severe thickening of the aortic valve. There is severe aortic valve annular calcification. Aortic valve regurgitation is moderate. No aortic stenosis is present. Aortic valve mean gradient measures 8.0 mmHg. Aortic valve peak gradient measures 14.1 mmHg. Aortic valve area, by VTI measures 2.50 cm. Pulmonic Valve: The pulmonic valve was not well visualized. Pulmonic valve regurgitation is not visualized. No evidence of pulmonic stenosis. Aorta: The aortic root is normal in size and structure. IAS/Shunts: No atrial level shunt detected by color flow Doppler. Agitated saline contrast bubble study was negative, with no evidence of any interatrial shunt.  LEFT VENTRICLE PLAX 2D LVIDd:         4.30 cm   Diastology LVIDs:         3.90 cm   LV e' medial:    6.50 cm/s LV PW:         1.05 cm   LV E/e' medial:  17.6 LV IVS:        1.05 cm   LV e' lateral:   11.00 cm/s LVOT diam:     2.20 cm   LV E/e' lateral: 10.4 LV SV:         67 LV SV Index:   39 LVOT Area:     3.80 cm  RIGHT VENTRICLE            IVC RV Basal diam:  4.20 cm    IVC diam: 2.10 cm RV S prime:     7.62 cm/s TAPSE (M-mode): 1.5 cm LEFT ATRIUM              Index        RIGHT ATRIUM           Index LA diam:        4.30 cm  2.49 cm/m   RA Area:     33.60 cm LA Vol (A2C):   127.0 ml 73.43 ml/m  RA Volume:   117.00 ml 67.65 ml/m LA Vol (A4C):   111.0 ml 64.18 ml/m LA Biplane Vol: 120.0 ml 69.38 ml/m  AORTIC VALVE AV Area (Vmax):    2.64 cm AV Area (Vmean):   2.23 cm AV Area (VTI):     2.50 cm AV Vmax:           188.00 cm/s AV Vmean:          132.000 cm/s AV VTI:            0.269 m AV Peak Grad:      14.1 mmHg AV Mean Grad:      8.0 mmHg LVOT Vmax:         130.71 cm/s LVOT  Vmean:        77.391 cm/s LVOT VTI:          0.177 m LVOT/AV VTI ratio: 0.66 AR Vena Contracta: 0.40 cm  AORTA Ao Root diam: 2.70 cm MITRAL VALVE                TRICUSPID VALVE MV Area (PHT): 3.94 cm     TR Peak grad:   35.3 mmHg MV Decel Time: 193 msec     TR Vmax:        297.00 cm/s MV E velocity: 114.67 cm/s                             SHUNTS  Systemic VTI:  0.18 m                             Systemic Diam: 2.20 cm Carlyle Dolly MD Electronically signed by Carlyle Dolly MD Signature Date/Time: 03/16/2022/6:55:53 PM    Final    CT ABDOMEN PELVIS W CONTRAST  Result Date: 03/16/2022 CLINICAL DATA:  Abdominal pain. EXAM: CT ABDOMEN AND PELVIS WITH CONTRAST TECHNIQUE: Multidetector CT imaging of the abdomen and pelvis was performed using the standard protocol following bolus administration of intravenous contrast. RADIATION DOSE REDUCTION: This exam was performed according to the departmental dose-optimization program which includes automated exposure control, adjustment of the mA and/or kV according to patient size and/or use of iterative reconstruction technique. CONTRAST:  43m OMNIPAQUE IOHEXOL 350 MG/ML SOLN COMPARISON:  None Available. FINDINGS: Evaluation of this exam is limited due to respiratory motion as well as loose stool streak artifact caused by patient's arms. Lower chest: There are bibasilar interstitial coarsening. There is moderate cardiomegaly. Three vessel coronary vascular calcification. No intra-abdominal free air for free fluid. Hepatobiliary: Several small liver cysts. The liver is otherwise unremarkable. No biliary ductal dilatation. The gallbladder is unremarkable. Pancreas: The pancreas is unremarkable as visualized. Spleen: Normal in size without focal abnormality. Adrenals/Urinary Tract: The adrenal glands unremarkable. There is no hydronephrosis on either side. There is symmetric enhancement and excretion of contrast by both kidneys. The visualized  ureters and urinary bladder appear unremarkable. Stomach/Bowel: There is moderate stool throughout the colon. There are scattered sigmoid diverticula without active inflammatory changes. There is no bowel obstruction or active inflammation. The appendix is not visualized with certainty. No inflammatory changes identified in the right lower quadrant. Vascular/Lymphatic: The aorta is tortuous. There is advanced aortoiliac atherosclerotic disease. An infrarenal IVC filter is noted. No portal venous gas. There is no adenopathy. Reproductive: The uterus is not visualized, likely surgically absent. There is a 3.2 cm right ovarian cyst. Soft tissue associated with the cyst likely represent the ovarian tissue. This can be better evaluated with ultrasound on a nonemergent/outpatient basis. The left ovary is not identified with certainty. Other: None Musculoskeletal: Osteopenia with degenerative changes of the spine. Bilateral femoral neck ORIF. No acute osseous pathology. IMPRESSION: 1. No acute intra-abdominal or pelvic pathology. 2. Constipation.  No bowel obstruction. 3. Sigmoid diverticulosis. 4. A 3.2 cm right ovarian cyst. This can be better evaluated with ultrasound on a nonemergent/outpatient basis. 5.  Aortic Atherosclerosis (ICD10-I70.0). Electronically Signed   By: AAnner CreteM.D.   On: 03/16/2022 01:17   MR BRAIN WO CONTRAST  Result Date: 03/16/2022 CLINICAL DATA:  Neuro deficit, stroke suspected EXAM: MRI HEAD WITHOUT CONTRAST TECHNIQUE: Multiplanar, multiecho pulse sequences of the brain and surrounding structures were obtained without intravenous contrast. COMPARISON:  No prior MRI available, correlation is made with CT head 03/15/2022 FINDINGS: Brain: Restricted diffusion with ADC correlate in the left posterior frontal cortex, including the precentral gyrus (series 5, images 82-87), consistent with acute infarcts. This areas not associated with significantly increased T2 hyperintense signal.  Additional smaller foci of mildly hyperintense signal on diffusion-weighted imaging without definite ADC correlate in the more anterior left frontal lobe may represent more subacute infarcts (series 5, images 82-83). No acute hemorrhage, mass, mass effect, or midline shift. No hydrocephalus or extra-axial collection. Encephalomalacia in the right greater than left frontal lobes, consistent with remote infarcts. Age related cerebral atrophy, with slightly more pronounced atrophy in the bilateral medial temporal lobes. T2  hyperintense signal in the periventricular white matter, likely the sequela of moderate chronic small vessel ischemic disease. Vascular: Normal arterial flow voids. Skull and upper cervical spine: Normal marrow signal. Sinuses/Orbits: Clear paranasal sinuses. Status post bilateral lens replacements. Other: Fluid in the mastoid air cells. IMPRESSION: Acute infarcts in the left posterior frontal cortex, including the precentral gyrus. Additional smaller subacute infarcts in the more anterior left frontal lobe. Imaging results were communicated on 03/16/2022 at 12:43 am to provider Dr. Rory Percy via secure text paging. Electronically Signed   By: Merilyn Baba M.D.   On: 03/16/2022 00:47   DG Chest Port 1 View  Result Date: 03/15/2022 CLINICAL DATA:  Screening EXAM: PORTABLE CHEST 1 VIEW COMPARISON:  Radiographs 01/16/2022 FINDINGS: No change from 01/16/2022. Airspace opacity in the right lower lung may represent chronic middle lobe collapse as there is slight rightward deviation of the mediastinum. Cardiomegaly. Aortic atherosclerotic calcification. Chronic bronchitic change. No displaced rib fractures. IMPRESSION: No change from 01/16/2022. Airspace opacity in the right lower lung may represent chronic middle lobe collapse as there is slight rightward deviation of the mediastinum. Electronically Signed   By: Placido Sou M.D.   On: 03/15/2022 23:54   CT ANGIO HEAD NECK W WO CM (CODE  STROKE)  Result Date: 03/15/2022 CLINICAL DATA:  Stroke suspected EXAM: CT ANGIOGRAPHY HEAD AND NECK TECHNIQUE: Multidetector CT imaging of the head and neck was performed using the standard protocol during bolus administration of intravenous contrast. Multiplanar CT image reconstructions and MIPs were obtained to evaluate the vascular anatomy. Carotid stenosis measurements (when applicable) are obtained utilizing NASCET criteria, using the distal internal carotid diameter as the denominator. RADIATION DOSE REDUCTION: This exam was performed according to the departmental dose-optimization program which includes automated exposure control, adjustment of the mA and/or kV according to patient size and/or use of iterative reconstruction technique. CONTRAST:  45m OMNIPAQUE IOHEXOL 350 MG/ML SOLN COMPARISON:  No prior CTA available, correlation is made with CT head 03/15/2022 FINDINGS: CT HEAD FINDINGS For noncontrast findings, please see same day CT head. CTA NECK FINDINGS Aortic arch: Incompletely imaged, but standard aortic branching is suspected. Imaged portion shows no evidence of aneurysm or dissection. No significant stenosis of the major arch vessel origins. Right carotid system: No evidence of dissection, occlusion, or hemodynamically significant stenosis (greater than 50%). Atherosclerotic disease at the bifurcation and in the proximal ICA is not hemodynamically significant. Tortuous distal ICAs. Left carotid system: No evidence of dissection, occlusion, or hemodynamically significant stenosis (greater than 50%). Atherosclerotic disease at the bifurcation and in the proximal ICA is not hemodynamically significant. Vertebral arteries: No evidence of dissection, occlusion, or hemodynamically significant stenosis (greater than 50%). The left vertebral artery is diminutive throughout its course. Skeleton: No acute osseous abnormality. Severe degenerative changes in the cervical spine, with reversal of the normal  cervical lordosis. Other neck: Negative. Upper chest: No focal pulmonary opacity or pleural effusion. Review of the MIP images confirms the above findings CTA HEAD FINDINGS Anterior circulation: Both internal carotid arteries are patent to the termini, with mild stenosis in the bilateral supraclinoid segments. Patent right A1. Severely hypoplastic left A1. Normal anterior communicating artery. Severe stenosis in the left A2 segment (series 7, images 70-72). Anterior cerebral arteries are otherwise patent to their distal aspects. No M1 stenosis or occlusion. MCA branches perfused and symmetric. Posterior circulation: The left vertebral artery terminates in PICA. The right vertebral artery is patent to the vertebrobasilar junction without stenosis. Posterior inferior cerebellar arteries patent proximally. Basilar  patent to its distal aspect. Superior cerebellar arteries patent proximally. Patent P1 segments. PCAs perfused to their distal aspects without stenosis. The bilateral posterior communicating arteries are not visualized. Venous sinuses: As permitted by contrast timing, patent. Anatomic variants: None significant. Review of the MIP images confirms the above findings IMPRESSION: 1. No intracranial large vessel occlusion. Severe stenosis in the left A2 segment. 2. No hemodynamically significant stenosis in the neck. Imaging results were communicated on 03/15/2022 at 11:26 pm to provider Dr. Rory Percy via secure text paging. Electronically Signed   By: Merilyn Baba M.D.   On: 03/15/2022 23:27   CT HEAD CODE STROKE WO CONTRAST  Result Date: 03/15/2022 CLINICAL DATA:  Code stroke. EXAM: CT HEAD WITHOUT CONTRAST TECHNIQUE: Contiguous axial images were obtained from the base of the skull through the vertex without intravenous contrast. RADIATION DOSE REDUCTION: This exam was performed according to the departmental dose-optimization program which includes automated exposure control, adjustment of the mA and/or kV  according to patient size and/or use of iterative reconstruction technique. COMPARISON:  03/22/2020 FINDINGS: Brain: No evidence of acute infarction, hemorrhage, mass, mass effect, or midline shift. No hydrocephalus or extra-axial collection. Redemonstrated right greater than left frontal cortical infarcts. Periventricular white matter changes, likely the sequela of chronic small vessel ischemic disease. Vascular: No hyperdense vessel. Atherosclerotic calcifications in the intracranial carotid and vertebral arteries. Skull: Negative for fracture or focal lesion. Sinuses/Orbits: No acute finding. Status post bilateral lens replacements. Other: The mastoid air cells are well aerated. ASPECTS [facility] Stroke Program Early CT Score) - Ganglionic level infarction (caudate, lentiform nuclei, internal capsule, insula, M1-M3 cortex): 7 - Supraganglionic infarction (M4-M6 cortex): 3 Total score (0-10 with 10 being normal): 10 IMPRESSION: 1. No acute intracranial process. 2. ASPECTS is 10. Imaging results were communicated on 03/15/2022 at 10:30 pm to provider Dr. Rory Percy via secure text paging. Electronically Signed   By: Merilyn Baba M.D.   On: 03/15/2022 22:30     TODAY-DAY OF DISCHARGE:  Subjective:   Bridget [name] today has no headache,no chest abdominal pain,no new weakness tingling or numbness, feels much better wants to go home today.   Objective:   Blood pressure 139/78, pulse 72, temperature 98 F (36.7 C), temperature source Oral, resp. rate 19, height '5\' 7"'$  (1.702 m), weight 62.8 kg, SpO2 95 %.  Intake/Output Summary (Last 24 hours) at 03/20/2022 0959 Last data filed at 03/20/2022 0200 Gross per 24 hour  Intake --  Output 800 ml  Net -800 ml   Filed Weights   03/15/22 2200  Weight: 62.8 kg    Exam: Awake Alert, Oriented *3, No new F.N deficits, Normal affect [place].AT,PERRAL Supple Neck,No JVD, No cervical lymphadenopathy appriciated.  Symmetrical Chest wall movement, Good air movement  bilaterally, CTAB RRR,No Gallops,Rubs or new Murmurs, No Parasternal Heave +ve B.Sounds, Abd Soft, Non tender, No organomegaly appriciated, No rebound -guarding or rigidity. No Cyanosis, Clubbing or edema, No new Rash or bruise   PERTINENT RADIOLOGIC STUDIES: No results found.   PERTINENT LAB RESULTS: CBC: No results for input(s): "WBC", "HGB", "HCT", "PLT" in the last 72 hours. CMET CMP     Component Value Date/Time   NA 136 03/16/2022 0450   NA 137 12/03/2021 1304   K 4.1 03/16/2022 0450   CL 101 03/16/2022 0450   CO2 25 03/16/2022 0450   GLUCOSE 109 (H) 03/16/2022 0450   BUN 15 03/16/2022 0450   BUN 15 12/03/2021 1304   CREATININE 0.75 03/16/2022 0450   CALCIUM 8.8 (  L) 03/16/2022 0450   PROT 6.1 (L) 03/15/2022 2220   PROT 6.0 08/28/2020 1402   ALBUMIN 3.4 (L) 03/15/2022 2220   ALBUMIN 3.8 08/28/2020 1402   AST 21 03/15/2022 2220   ALT 13 03/15/2022 2220   ALKPHOS 55 03/15/2022 2220   BILITOT 0.8 03/15/2022 2220   BILITOT 0.6 08/28/2020 1402   GFRNONAA >60 03/16/2022 0450    GFR Estimated Creatinine Clearance: 38.2 mL/min (by C-G formula based on SCr of 0.75 mg/dL). No results for input(s): "LIPASE", "AMYLASE" in the last 72 hours. No results for input(s): "CKTOTAL", "CKMB", "CKMBINDEX", "TROPONINI" in the last 72 hours. Invalid input(s): "POCBNP" No results for input(s): "DDIMER" in the last 72 hours. No results for input(s): "HGBA1C" in the last 72 hours. No results for input(s): "CHOL", "HDL", "LDLCALC", "TRIG", "CHOLHDL", "LDLDIRECT" in the last 72 hours. No results for input(s): "TSH", "T4TOTAL", "T3FREE", "THYROIDAB" in the last 72 hours.  Invalid input(s): "FREET3" No results for input(s): "VITAMINB12", "FOLATE", "FERRITIN", "TIBC", "IRON", "RETICCTPCT" in the last 72 hours. Coags: No results for input(s): "INR" in the last 72 hours.  Invalid input(s): "PT" Microbiology: Recent Results (from the past 240 hour(s))  Urine Culture (for pregnant,  neutropenic or urologic patients or patients with an indwelling urinary catheter)     Status: Abnormal   Collection Time: 03/15/22 11:00 PM   Specimen: Urine, Clean Catch  Result Value Ref Range Status   Specimen Description URINE, CLEAN CATCH  Final   Special Requests   Final    NONE Performed at [facility], [place] [address], [place], [place] [address]    Culture >=100,000 COLONIES/mL PROTEUS MIRABILIS (A)  Final   Report Status 03/18/2022 FINAL  Final   Organism ID, Bacteria PROTEUS MIRABILIS (A)  Final      Susceptibility   Proteus mirabilis - MIC*    AMPICILLIN <=2 SENSITIVE Sensitive     CEFAZOLIN <=4 SENSITIVE Sensitive     CEFEPIME <=0.12 SENSITIVE Sensitive     CEFTRIAXONE <=0.25 SENSITIVE Sensitive     CIPROFLOXACIN <=0.25 SENSITIVE Sensitive     GENTAMICIN <=1 SENSITIVE Sensitive     IMIPENEM 2 SENSITIVE Sensitive     NITROFURANTOIN 256 RESISTANT Resistant     TRIMETH/SULFA <=20 SENSITIVE Sensitive     AMPICILLIN/SULBACTAM <=2 SENSITIVE Sensitive     PIP/TAZO <=4 SENSITIVE Sensitive     * >=100,000 COLONIES/mL PROTEUS MIRABILIS    FURTHER DISCHARGE INSTRUCTIONS:  Get Medicines reviewed and adjusted: Please take all your medications with you for your next visit with your Primary MD  Laboratory/radiological data: Please request your Primary MD to go over all hospital tests and procedure/radiological results at the follow up, please ask your Primary MD to get all Hospital records sent to his/her office.  In some cases, they will be blood work, cultures and biopsy results pending at the time of your discharge. Please request that your primary care M.D. goes through all the records of your hospital data and follows up on these results.  Also Note the following: If you experience worsening of your admission symptoms, develop shortness of breath, life threatening emergency, suicidal or homicidal thoughts you must seek medical attention immediately by calling 911 or  calling your MD immediately  if symptoms less severe.  You must read complete instructions/literature along with all the possible adverse reactions/side effects for all the Medicines you take and that have been prescribed to you. Take any new Medicines after you have completely understood and accpet all the  possible adverse reactions/side effects.   Do not drive when taking Pain medications or sleeping medications (Benzodaizepines)  Do not take more than prescribed Pain, Sleep and Anxiety Medications. It is not advisable to combine anxiety,sleep and pain medications without talking with your primary care practitioner  Special Instructions: If you have smoked or chewed Tobacco  in the last 2 yrs please stop smoking, stop any regular Alcohol  and or any Recreational drug use.  Wear Seat belts while driving.  Please note: You were cared for by a hospitalist during your hospital stay. Once you are discharged, your primary care physician will handle any further medical issues. Please note that NO REFILLS for any discharge medications will be authorized once you are discharged, as it is imperative that you return to your primary care physician (or establish a relationship with a primary care physician if you do not have one) for your post hospital discharge needs so that they can reassess your need for medications and monitor your lab values.  Total Time spent coordinating discharge including counseling, education and face to face time equals greater than 30 minutes.  SignedOren Binet 03/20/2022 9:59 AM

## 2022-03-21 ENCOUNTER — Encounter (HOSPITAL_COMMUNITY): Payer: Self-pay | Admitting: Physical Medicine & Rehabilitation

## 2022-03-21 ENCOUNTER — Other Ambulatory Visit: Payer: Self-pay

## 2022-03-21 LAB — COMPREHENSIVE METABOLIC PANEL
ALT: 15 U/L (ref 0–44)
AST: 19 U/L (ref 15–41)
Albumin: 3 g/dL — ABNORMAL LOW (ref 3.5–5.0)
Alkaline Phosphatase: 50 U/L (ref 38–126)
Anion gap: 9 (ref 5–15)
BUN: 13 mg/dL (ref 8–23)
CO2: 21 mmol/L — ABNORMAL LOW (ref 22–32)
Calcium: 8.3 mg/dL — ABNORMAL LOW (ref 8.9–10.3)
Chloride: 108 mmol/L (ref 98–111)
Creatinine, Ser: 0.63 mg/dL (ref 0.44–1.00)
GFR, Estimated: >60 mL/min (ref >60)
Glucose, Bld: 96 mg/dL (ref 70–99)
Potassium: 3.8 mmol/L (ref 3.5–5.1)
Sodium: 138 mmol/L (ref 135–145)
Total Bilirubin: 0.7 mg/dL (ref 0.3–1.2)
Total Protein: 5.7 g/dL — ABNORMAL LOW (ref 6.5–8.1)

## 2022-03-21 LAB — CBC WITH DIFFERENTIAL/PLATELET
Abs Immature Granulocytes: 0.01 10*3/uL (ref 0.00–0.07)
Basophils Absolute: 0 10*3/uL (ref 0.0–0.1)
Basophils Relative: 1 %
Eosinophils Absolute: 0.1 10*3/uL (ref 0.0–0.5)
Eosinophils Relative: 2 %
HCT: 37.7 % (ref 36.0–46.0)
Hemoglobin: 13 g/dL (ref 12.0–15.0)
Immature Granulocytes: 0 %
Lymphocytes Relative: 24 %
Lymphs Abs: 1.2 10*3/uL (ref 0.7–4.0)
MCH: 33.2 pg (ref 26.0–34.0)
MCHC: 34.5 g/dL (ref 30.0–36.0)
MCV: 96.2 fL (ref 80.0–100.0)
Monocytes Absolute: 0.4 10*3/uL (ref 0.1–1.0)
Monocytes Relative: 8 %
Neutro Abs: 3.3 10*3/uL (ref 1.7–7.7)
Neutrophils Relative %: 65 %
Platelets: 176 10*3/uL (ref 150–400)
RBC: 3.92 MIL/uL (ref 3.87–5.11)
RDW: 14.2 % (ref 11.5–15.5)
WBC: 5.1 10*3/uL (ref 4.0–10.5)
nRBC: 0 % (ref 0.0–0.2)

## 2022-03-21 MED ORDER — NITROFURANTOIN MACROCRYSTAL 50 MG PO CAPS: 50.0000 mg | ORAL_CAPSULE | Freq: Every day | ORAL | Status: DC

## 2022-03-21 MED ORDER — MUSCLE RUB 10-15 % EX CREA: TOPICAL_CREAM | CUTANEOUS | Status: DC | PRN

## 2022-03-21 MED ORDER — CHOLECALCIFEROL 10 MCG (400 UNIT) PO TABS
400.0000 [IU] | ORAL_TABLET | Freq: Every day | ORAL | Status: DC
  Administered 2022-03-21 – 2022-04-05 (×15): 400 [IU] via ORAL
  Filled 2022-03-21 (×18): qty 1

## 2022-03-21 MED ORDER — APIXABAN 2.5 MG PO TABS
2.5000 mg | ORAL_TABLET | Freq: Two times a day (BID) | ORAL | Status: DC
  Administered 2022-03-21 – 2022-04-05 (×31): 2.5 mg via ORAL
  Filled 2022-03-21 (×32): qty 1

## 2022-03-21 MED ORDER — NITROFURANTOIN MACROCRYSTAL 50 MG PO CAPS
50.0000 mg | ORAL_CAPSULE | Freq: Every day | ORAL | Status: DC
  Administered 2022-03-21 – 2022-04-04 (×15): 50 mg via ORAL
  Filled 2022-03-21 (×15): qty 1

## 2022-03-21 NOTE — Plan of Care (Signed)
Problem: RH Balance Goal: LTG: Patient will maintain dynamic sitting balance (OT) Description: LTG:  Patient will maintain dynamic sitting balance with assistance during activities of daily living (OT) Flowsheets (Taken 03/21/2022 1255) LTG: Pt will maintain dynamic sitting balance during ADLs with: Contact Guard/Touching assist   Problem: Sit to Stand Goal: LTG:  Patient will perform sit to stand in prep for activites of daily living with assistance level (OT) Description: LTG:  Patient will perform sit to stand in prep for activites of daily living with assistance level (OT) Flowsheets (Taken 03/21/2022 1255) LTG: PT will perform sit to stand in prep for activites of daily living with assistance level: Minimal Assistance - Patient > 75%   Problem: RH Grooming Goal: LTG Patient will perform grooming w/assist,cues/equip (OT) Description: LTG: Patient will perform grooming with assist, with/without cues using equipment (OT) Flowsheets (Taken 03/21/2022 1255) LTG: Pt will perform grooming with assistance level of: Minimal Assistance - Patient > 75%   Problem: RH Bathing Goal: LTG Patient will bathe all body parts with assist levels (OT) Description: LTG: Patient will bathe all body parts with assist levels (OT) Flowsheets (Taken 03/21/2022 1255) LTG: Pt will perform bathing with assistance level/cueing: Moderate Assistance - Patient 50 - 74% LTG: Position pt will perform bathing: Shower   Problem: RH Dressing Goal: LTG Patient will perform upper body dressing (OT) Description: LTG Patient will perform upper body dressing with assist, with/without cues (OT). Flowsheets (Taken 03/21/2022 1255) LTG: Pt will perform upper body dressing with assistance level of: Minimal Assistance - Patient > 75% Goal: LTG Patient will perform lower body dressing w/assist (OT) Description: LTG: Patient will perform lower body dressing with assist, with/without cues in positioning using equipment (OT) Flowsheets  (Taken 03/21/2022 1255) LTG: Pt will perform lower body dressing with assistance level of: Moderate Assistance - Patient 50 - 74%   Problem: RH Toileting Goal: LTG Patient will perform toileting task (3/3 steps) with assistance level (OT) Description: LTG: Patient will perform toileting task (3/3 steps) with assistance level (OT)  Flowsheets (Taken 03/21/2022 1255) LTG: Pt will perform toileting task (3/3 steps) with assistance level: Minimal Assistance - Patient > 75%   Problem: RH Functional Use of Upper Extremity Goal: LTG Patient will use RT/LT upper extremity as a (OT) Description: LTG: Patient will use right/left upper extremity as a stabilizer/gross assist/diminished/nondominant/dominant level with assist, with/without cues during functional activity (OT) Flowsheets (Taken 03/21/2022 1255) LTG: Use of upper extremity in functional activities: RUE as diminished level LTG: Pt will use upper extremity in functional activity with assistance level of: Supervision/Verbal cueing   Problem: RH Toilet Transfers Goal: LTG Patient will perform toilet transfers w/assist (OT) Description: LTG: Patient will perform toilet transfers with assist, with/without cues using equipment (OT) Flowsheets (Taken 03/21/2022 1255) LTG: Pt will perform toilet transfers with assistance level of: Minimal Assistance - Patient > 75%   Problem: RH Tub/Shower Transfers Goal: LTG Patient will perform tub/shower transfers w/assist (OT) Description: LTG: Patient will perform tub/shower transfers with assist, with/without cues using equipment (OT) Flowsheets (Taken 03/21/2022 1255) LTG: Pt will perform tub/shower stall transfers with assistance level of: Minimal Assistance - Patient > 75% LTG: Pt will perform tub/shower transfers from: Tub/shower combination   Problem: RH Memory Goal: LTG Patient will demonstrate ability for day to day recall/carry over during activities of daily living with assistance level  (OT) Description: LTG:  Patient will demonstrate ability for day to day recall/carry over during activities of daily living with assistance level (OT). Flowsheets (  Taken 03/21/2022 1255) LTG:  Patient will demonstrate ability for day to day recall/carry over during activities of daily living with assistance level (OT): Supervision

## 2022-03-21 NOTE — Progress Notes (Signed)
Inpatient Rehabilitation Admission Medication Review by a Pharmacist  A complete drug regimen review was completed for this patient to identify any potential clinically significant medication issues.  High Risk Drug Classes Is patient taking? Indication by Medication  Antipsychotic No   Anticoagulant Yes Apixaban-PAF  Antibiotic No   Opioid No   Antiplatelet No   Hypoglycemics/insulin No   Vasoactive Medication Yes Diltiazem-PAF  Chemotherapy No   Other Yes Albuterol SOA, COPD Atorvastatin-HLD Melatonin-sleep Memantine-dementia Dulera/Incruse-COPD Miralax-constipation     Type of Medication Issue Identified Description of Issue Recommendation(s)  Drug Interaction(s) (clinically significant)     Duplicate Therapy     Allergy     No Medication Administration End Date     Incorrect Dose     Additional Drug Therapy Needed     Significant med changes from prior encounter (inform family/care partners about these prior to discharge).    Other       Clinically significant medication issues were identified that warrant physician communication and completion of prescribed/recommended actions by midnight of the next day:  No  Name of provider notified for urgent issues identified:   Provider Method of Notification:     Pharmacist comments:   Time spent performing this drug regimen review (minutes):  20   Markisha Meding A. Levada Dy, PharmD, BCPS, FNKF Clinical Pharmacist [place] Please utilize Amion for appropriate phone number to reach the unit pharmacist ([place])  03/20/2022 1:22 PM

## 2022-03-21 NOTE — Progress Notes (Signed)
Patient ID: Bridget [name], female   DOB: [birth year], 89 y.o.   MRN: [id number] Met with the patient, daughter and great- grand daughter to review current situation, rehab process, team conference and plan of care. Reviewed secondary risk management, medications and dietary modification recommendations. Daughter asking about medications for vision, bronchiectasis Curcuplex and N-AC and Vit D3. Also reporting neck (muscular) pain; requested muscle rub and OT checking on Kinesio taping.  Hx of a UTI; taking cranberry supplement PTA. [facility] notified of request to continue home meds/supplements. Continue to follow along to address educational needs to facilitate preparation for discharge. Margarito Liner

## 2022-03-21 NOTE — Progress Notes (Signed)
PROGRESS NOTE   Subjective/Complaints:  [place] grand daughter and daughter at bedside, pt asking why RIght hand and wrist are weak, unaware of CVA Cannot name her family members (baseline per family)  ROS- limited by cognition  Objective:   No results found. Recent Labs    03/21/22 0716  WBC 5.1  HGB 13.0  HCT 37.7  PLT 176   Recent Labs    03/21/22 0716  NA 138  K 3.8  CL 108  CO2 21*  GLUCOSE 96  BUN 13  CREATININE 0.63  CALCIUM 8.3*    Intake/Output Summary (Last 24 hours) at 03/21/2022 0921 Last data filed at 03/21/2022 0730 Gross per 24 hour  Intake --  Output 1000 ml  Net -1000 ml        Physical Exam: Vital Signs Blood pressure (!) 132/53, pulse (!) 59, temperature 97.7 F (36.5 C), temperature source Oral, resp. rate 18, height 5\' 5"  (1.651 m), weight 58.5 kg, SpO2 94 %.   General: No acute distress Mood and affect are appropriate Heart: Regular rate and rhythm no rubs murmurs or extra sounds Lungs: Clear to auscultation, breathing unlabored, no rales or wheezes Abdomen: Positive bowel sounds, soft nontender to palpation, nondistended Extremities: No clubbing, cyanosis, or edema Skin: No evidence of breakdown, no evidence of rash Neurologic: Cranial nerves II through XII intact, motor strength is 5/5 in left  deltoid, bicep, tricep, grip, hip flexor, knee extensors, ankle dorsiflexor and plantar flexor 3- RIght delt, biceps, 2- wrist and finger flexors and extensor 3- Right HF, KE, ADF  Musculoskeletal: Full range of motion in all 4 extremities. No joint swelling, crepitus bilateral knees   Assessment/Plan: 1. Functional deficits which require 3+ hours per day of interdisciplinary therapy in a comprehensive inpatient rehab setting. Physiatrist is providing close team supervision and 24 hour management of active medical problems listed below. Physiatrist and rehab team continue to assess  barriers to discharge/monitor patient progress toward functional and medical goals  Care Tool:  Bathing              Bathing assist       Upper Body Dressing/Undressing Upper body dressing        Upper body assist      Lower Body Dressing/Undressing Lower body dressing            Lower body assist       Toileting Toileting    Toileting assist       Transfers Chair/bed transfer  Transfers assist           Locomotion Ambulation   Ambulation assist              Walk 10 feet activity   Assist           Walk 50 feet activity   Assist           Walk 150 feet activity   Assist           Walk 10 feet on uneven surface  activity   Assist           Wheelchair     Assist  Wheelchair 50 feet with 2 turns activity    Assist            Wheelchair 150 feet activity     Assist          Blood pressure (!) 132/53, pulse (!) 59, temperature 97.7 F (36.5 C), temperature source Oral, resp. rate 18, height 5\' 5"  (1.651 m), weight 58.5 kg, SpO2 94 %.  Medical Problem List and Plan: 1. 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  Antithrombotics: -DVT/anticoagulation:  Pharmaceutical: Eliquis according to pharmacy given age and GFR, will use 2.5mg  BID              -antiplatelet therapy: N/A 3. Pain Management: Tylenol as needed Hx of endstage OA of knees gets gel and cortisone injections last performed ~3wks ago per daughter 4. Mood/Behavior/Sleep: Namenda 10 mg twice daily, melatonin QHS             -antipsychotic agents: N/A 5. Neuropsych/cognition: This patient is not capable of making decisions on her own behalf. Has moderate dementia 6. Skin/Wound Care: Routine skin checks 7.  Fluids/Electrolytes/Nutrition: Routine in and outs with follow-up chemistries 8.  Atrial fibrillation with RVR LAA thrombus.  Continue Eliquis, Cardizem 360 mg daily.  Cardiac rate controlled 9.  UTI/Proteus Mirabalis.  Completed  course of Rocephin 10.  Hyperlipidemia.  Lipitor 11.  Essential hypertension.  Continue Cardizem as directed 12.  Diabetes mellitus.  Diet controlled at baseline.  Hemoglobin A1c 5.6. 13.  Chronic bronchitis.  Continue inhalers as indicated 14. Urinary incontinence- daughter wants purewick- I think due to her issues, that's reasonable and timed voiding q4 hours during day- since cannot get her OOB easily at night due to her stroke- hopefully will improve. 15. Constipation with incontinence- suggest Sorbitol once seen therapy, so can get her on BSC    LOS: 1 days A FACE TO [place] E Laporchia Nakajima 03/21/2022, 9:21 AM

## 2022-03-21 NOTE — Progress Notes (Signed)
[facility] Individual Statement of Services  Patient Name:  Bridget [name]  Date:  03/21/2022  Welcome to the [facility].  Our goal is to provide you with an individualized program based on your diagnosis and situation, designed to meet your specific needs.  With this comprehensive rehabilitation program, you will be expected to participate in at least 3 hours of rehabilitation therapies Monday-Friday, with modified therapy programming on the weekends.  Your rehabilitation program will include the following services:  Physical Therapy (PT), Occupational Therapy (OT), Speech Therapy (ST), 24 hour per day rehabilitation nursing, Therapeutic Recreaction (TR), Neuropsychology, Care Coordinator, Rehabilitation Medicine, Nutrition Services, Pharmacy Services, and Other  Weekly team conferences will be held on Wednesdays to discuss your progress.  Your Inpatient Rehabilitation Care Coordinator will talk with you frequently to get your input and to update you on team discussions.  Team conferences with you and your family in attendance may also be held.  Expected length of stay:  12-14 Days  Overall anticipated outcome:  Min A  Depending on your progress and recovery, your program may change. Your Inpatient Rehabilitation Care Coordinator will coordinate services and will keep you informed of any changes. Your Inpatient Rehabilitation Care Coordinator's name and contact numbers are listed  below.  The following services may also be recommended but are not provided by the [facility]:   [place] will be made to provide these services after discharge if needed.  Arrangements include referral to agencies that provide these services.  Your insurance has been verified to be:   Medicare A & B Your primary doctor is:  Tisovec, Richard, MD  Pertinent information will be  shared with your doctor and your insurance company.  Inpatient Rehabilitation Care Coordinator:  Erlene Quan, [place] or [phone]  Information discussed with and copy given to patient by: [name], 03/21/2022, 7:47 AM

## 2022-03-21 NOTE — Discharge Instructions (Addendum)
Inpatient Rehab Discharge Instructions  Bridget [name] Discharge date and time: No discharge date for patient encounter.   Activities/Precautions/ Functional Status: Activity: activity as tolerated Diet: regular diet Wound Care: Routine skin checks Functional status:  ___ No restrictions     ___ Walk up steps independently ___ 24/7 supervision/assistance   ___ Walk up steps with assistance ___ Intermittent supervision/assistance  ___ Bathe/dress independently ___ Walk with walker     _x__ Bathe/dress with assistance ___ Walk Independently    ___ Shower independently ___ Walk with assistance    ___ Shower with assistance ___ No alcohol     ___ Return to work/school ________  COMMUNITY REFERRALS UPON DISCHARGE:    Home Health:   PT     OT        SNA                    Agency: [facility]  Phone: [phone]    Medical Equipment/Items Ordered: Lap tray, Wheelchair, Transfer Board, [employer], Bariatric Drop Arm, Hospital Bed.                                                  Agency/Supplier: [id number]   Special Instructions: No driving smoking or alcoholSTROKE/TIA DISCHARGE INSTRUCTIONS SMOKING Cigarette smoking nearly doubles your risk of having a stroke & is the single most alterable risk factor  If you smoke or have smoked in the last 12 months, you are advised to quit smoking for your health. Most of the excess cardiovascular risk related to smoking disappears within a year of stopping. Ask you doctor about anti-smoking medications [place] Quit Line: 1-800-QUIT NOW Free Smoking Cessation Classes [phone]  CHOLESTEROL Know your levels; limit fat & cholesterol in your diet  Lipid Panel     Component Value Date/Time   CHOL 252 (H) 03/16/2022 0144   TRIG 68 03/16/2022 0144   HDL 84 03/16/2022 0144   CHOLHDL 3.0 03/16/2022 0144   VLDL 14 03/16/2022 0144   LDLCALC 154 (H) 03/16/2022 0144     Many patients benefit from treatment even if their cholesterol is at  goal. Goal: Total Cholesterol (CHOL) less than 160 Goal:  Triglycerides (TRIG) less than 150 Goal:  HDL greater than 40 Goal:  LDL (LDLCALC) less than 100   BLOOD PRESSURE American Stroke Association blood pressure target is less that 120/80 mm/Hg  Your discharge blood pressure is:  BP: (!) 135/53 Monitor your blood pressure Limit your salt and alcohol intake Many individuals will require more than one medication for high blood pressure  DIABETES (A1c is a blood sugar average for last 3 months) Goal HGBA1c is under 7% (HBGA1c is blood sugar average for last 3 months)  Diabetes: No known diagnosis of diabetes    Lab Results  Component Value Date   HGBA1C 5.6 03/16/2022    Your HGBA1c can be lowered with medications, healthy diet, and exercise. Check your blood sugar as directed by your physician Call your physician if you experience unexplained or low blood sugars.  PHYSICAL ACTIVITY/REHABILITATION Goal is 30 minutes at least 4 days per week  Activity: Increase activity slowly, Therapies: Physical Therapy: Home Health Return to work:  Activity decreases your risk of heart attack and stroke and makes your heart stronger.  It helps control your weight and blood pressure; helps you  relax and can improve your mood. Participate in a regular exercise program. Talk with your doctor about the best form of exercise for you (dancing, walking, swimming, cycling).  DIET/WEIGHT Goal is to maintain a healthy weight  Your discharge diet is:  Diet Order             Diet regular Room service appropriate? Yes; Fluid consistency: Thin  Diet effective now                   liquids Your height is:  Height: 5\' 5"  (165.1 cm) Your current weight is: Weight: 58.5 kg Your Body Mass Index (BMI) is:  BMI (Calculated): 21.46 Following the type of diet specifically designed for you will help prevent another stroke. Your goal weight range is:   Your goal Body Mass Index (BMI) is 19-24. Healthy food  habits can help reduce 3 risk factors for stroke:  High cholesterol, hypertension, and excess weight.  RESOURCES Stroke/Support Group:  Call [phone]   STROKE EDUCATION PROVIDED/REVIEWED AND GIVEN TO PATIENT Stroke warning signs and symptoms How to activate emergency medical system (call 911). Medications prescribed at discharge. Need for follow-up after discharge. Personal risk factors for stroke. Pneumonia vaccine given: No Flu vaccine given: No My questions have been answered, the writing is legible, and I understand these instructions.  I will adhere to these goals & educational materials that have been provided to me after my discharge from the hospital.      My questions have been answered and I understand these instructions. I will adhere to these goals and the provided educational materials after my discharge from the hospital.  Patient/Caregiver Signature _______________________________ Date __________  Clinician Signature _______________________________________ Date __________  Please bring this form and your medication list with you to all your follow-up doctor's appointments.    >>>>>>>>>>>>>>>>>>>>>>>>>>>>>>>>>>>>>>>>>>>>>>>>>>>>>>>>>>>>>>>>>>>>>>>>>>>>>>>>>>>>>>>>>>>>>>>>>>>>>>>>>>>> Information on my medicine - ELIQUIS (apixaban)  This medication education was reviewed with me or my healthcare representative as part of my discharge preparation.    You were taking this medication prior to this hospital admission.   Why was Eliquis prescribed for you? Eliquis was prescribed for you to reduce the risk of a blood clot forming that can cause a stroke if you have a medical condition called atrial fibrillation (a type of irregular heartbeat).  What do You need to know about Eliquis ? Take your Eliquis TWICE DAILY - one tablet in the morning and one tablet in the evening with or without food. If you have difficulty swallowing the tablet whole please discuss with  your pharmacist how to take the medication safely.  Take Eliquis exactly as prescribed by your doctor and DO NOT stop taking Eliquis without talking to the doctor who prescribed the medication.  Stopping may increase your risk of developing a stroke.  Refill your prescription before you run out.  After discharge, you should have regular check-up appointments with your healthcare provider that is prescribing your Eliquis.  In the future your dose may need to be changed if your kidney function or weight changes by a significant amount or as you get older.  What do you do if you miss a dose? If you miss a dose, take it as soon as you remember on the same day and resume taking twice daily.  Do not take more than one dose of ELIQUIS at the same time to make up a missed dose.  Important Safety Information A possible side effect of Eliquis is bleeding. You should call your healthcare  provider right away if you experience any of the following: Bleeding from an injury or your nose that does not stop. Unusual colored urine (red or dark brown) or unusual colored stools (red or black). Unusual bruising for unknown reasons. A serious fall or if you hit your head (even if there is no bleeding).  Some medicines may interact with Eliquis and might increase your risk of bleeding or clotting while on Eliquis. To help avoid this, consult your healthcare provider or pharmacist prior to using any new prescription or non-prescription medications, including herbals, vitamins, non-steroidal anti-inflammatory drugs (NSAIDs) and supplements.  This website has more information on Eliquis (apixaban): http://www.eliquis.com/eliquis/home

## 2022-03-21 NOTE — Plan of Care (Addendum)
I have read and reviewed the attached note and am in agreement with the documentation provided.           This licensed clinician was present and actively directing care throughout the  session at all times.        Judieth Keens PT, DPT, CSRS ------------------------------------------  Problem: RH Balance Goal: LTG Patient will maintain dynamic sitting balance (PT) Description: LTG:  Patient will maintain dynamic sitting balance with assistance during mobility activities (PT) Flowsheets (Taken 03/21/2022 1602) LTG: Pt will maintain dynamic sitting balance during mobility activities with:: Minimal Assistance - Patient > 75% Goal: LTG Patient will maintain dynamic standing balance (PT) Description: LTG:  Patient will maintain dynamic standing balance with assistance during mobility activities (PT) Flowsheets (Taken 03/21/2022 1602) LTG: Pt will maintain dynamic standing balance during mobility activities with:: Minimal Assistance - Patient > 75%   Problem: Sit to Stand Goal: LTG:  Patient will perform sit to stand with assistance level (PT) Description: LTG:  Patient will perform sit to stand with assistance level (PT) Flowsheets (Taken 03/21/2022 1602) LTG: PT will perform sit to stand in preparation for functional mobility with assistance level: Minimal Assistance - Patient > 75%   Problem: RH Bed Mobility Goal: LTG Patient will perform bed mobility with assist (PT) Description: LTG: Patient will perform bed mobility with assistance, with/without cues (PT). Flowsheets (Taken 03/21/2022 1602) LTG: Pt will perform bed mobility with assistance level of: Minimal Assistance - Patient > 75%   Problem: RH Bed to Chair Transfers Goal: LTG Patient will perform bed/chair transfers w/assist (PT) Description: LTG: Patient will perform bed to chair transfers with assistance (PT). Flowsheets (Taken 03/21/2022 1602) LTG: Pt will perform Bed to Chair Transfers with assistance level: Minimal Assistance -  Patient > 75%   Problem: RH Ambulation Goal: LTG Patient will ambulate in controlled environment (PT) Description: LTG: Patient will ambulate in a controlled environment, # of feet with assistance (PT). Flowsheets (Taken 03/21/2022 1602) LTG: Pt will ambulate in controlled environ  assist needed:: Minimal Assistance - Patient > 75% LTG: Ambulation distance in controlled environment: 56ft Goal: LTG Patient will ambulate in home environment (PT) Description: LTG: Patient will ambulate in home environment, # of feet with assistance (PT). Flowsheets (Taken 03/21/2022 1602) LTG: Pt will ambulate in home environ  assist needed:: Minimal Assistance - Patient > 75% LTG: Ambulation distance in home environment: 31ft Goal: LTG Patient will ambulate in community environment (PT) Description: LTG: Patient will ambulate in community environment, # of feet with assistance (PT). Flowsheets (Taken 03/21/2022 1602) LTG: Pt will ambulate in community environ  assist needed:: Minimal Assistance - Patient > 75% LTG: Ambulation distance in community environment: 35ft   Problem: RH Wheelchair Mobility Goal: LTG Patient will propel w/c in controlled environment (PT) Description: LTG: Patient will propel wheelchair in controlled environment, # of feet with assist (PT) Flowsheets (Taken 03/21/2022 1602) LTG: Pt will propel w/c in controlled environ  assist needed:: Moderate Assistance - Patient 50 - 74% LTG: Propel w/c distance in controlled environment: 78ft Goal: LTG Patient will propel w/c in home environment (PT) Description: LTG: Patient will propel wheelchair in home environment, # of feet with assistance (PT). Flowsheets (Taken 03/21/2022 1602) LTG: Pt will propel w/c in home environ  assist needed:: Moderate Assistance - Patient 50 - 74% LTG: Propel w/c distance in home environment: 85ft Goal: LTG Patient will propel w/c in community environment (PT) Description: LTG: Patient will propel wheelchair in  community environment, # of feet  with assist (PT) Flowsheets (Taken 03/21/2022 1602) LTG: Pt will propel w/c in community environ  assist needed:: Moderate Assistance - Patient 50 - 74%   Problem: RH Stairs Goal: LTG Patient will ambulate up and down stairs w/assist (PT) Description: LTG: Patient will ambulate up and down # of stairs with assistance (PT) Flowsheets (Taken 03/21/2022 1602) LTG: Pt will ambulate up/down stairs assist needed:: Minimal Assistance - Patient > 75% LTG: Pt will  ambulate up and down number of stairs: 3

## 2022-03-21 NOTE — Progress Notes (Signed)
Inpatient Rehabilitation  Patient information reviewed and entered into eRehab system by Savva Beamer Naya Ilagan, OTR/L, Rehab Quality Coordinator.   Information including medical coding, functional ability and quality indicators will be reviewed and updated through discharge.   

## 2022-03-21 NOTE — Progress Notes (Signed)
Inpatient Rehabilitation Care Coordinator Assessment and Plan Patient Details  Name: Bridget [name] MRN: [id number] Date of Birth: 1921-09-25  Today's Date: 03/21/2022  Hospital Problems: Principal Problem:   Ischemic stroke of frontal lobe [facility]) Active Problems:   Embolic cerebral infarction [facility])  Past Medical History:  Past Medical History:  Diagnosis Date   Atherosclerosis of aorta ([place])    Atrial fibrillation ([place])    Breast cancer ([place])    Chronic Bronchitis    Coronary atherosclerosis due to calcified coronary lesion    Nocturnal polyuria    Stroke ([place])    Thrombus of left atrial appendage    Past Surgical History:  Past Surgical History:  Procedure Laterality Date   BREAST LUMPECTOMY Left 2001   FEMUR FRACTURE SURGERY Right 2019   RIGHT - 2015   TOTAL ABDOMINAL HYSTERECTOMY  1967   Social History:  reports that she quit smoking about 74 years ago. Her smoking use included cigarettes. She has a 10.50 pack-year smoking history. She has never used smokeless tobacco. She reports that she does not drink alcohol and does not use drugs.  Family / Support Systems Marital Status: Widow/Widower Patient Roles: Parent Spouse/Significant Other: N/A Children: 2 daughter 1 son Other Supports: Granddaughter, 30 Anticipated Caregiver: Daughter, Kennyth Lose Ability/Limitations of Caregiver: Min A overall Caregiver Availability: 24/7 Family Dynamics: support from children and granddaughter  Social History Preferred language: English Religion: [employer] Cultural Background: N/A Education: [place] - How often do you need to have someone help you when you read instructions, pamphlets, or other written material from your doctor or pharmacy?: Always Writes: Yes Employment Status: Unemployed Public relations account executive Issues: N/A Guardian/Conservator: Photographer (daughter)   Abuse/Neglect Abuse/Neglect Assessment Can Be Completed: Yes Physical Abuse: Denies Verbal Abuse:  Denies Sexual Abuse: Denies Exploitation of patient/patient's resources: Denies Self-Neglect: Denies  Patient response to: Social Isolation - How often do you feel lonely or isolated from those around you?: Patient unable to respond  Emotional Status Pt's affect, behavior and adjustment status: pleasant daughter at bedside assisting Recent Psychosocial Issues: Coping Psychiatric History: N/A Substance Abuse History: N/A  Patient / Family Perceptions, Expectations & Goals Pt/Family understanding of illness & functional limitations: yes, daughter at beside Premorbid pt/family roles/activities: Living with daughter, limited in the community. Assistance with ADLS and Sup for mobility with rollator Anticipated changes in roles/activities/participation: Daughter plans to care for patient at home at [facility] level, if unable to manage [facility] SNF Pt/family expectations/goals: [place] Agencies: None Premorbid Home Care/DME Agencies: Other (Comment) Media planner, rollator, SPC, Grab bards, toilet riser) Transportation available at discharge: [place] or granddaughter Is the patient able to respond to transportation needs?: Yes In the past 12 months, has lack of transportation kept you from medical appointments or from getting medications?: No In the past 12 months, has lack of transportation kept you from meetings, work, or from getting things needed for daily living?: No  Discharge Planning Living Arrangements: Children Support Systems: Children, Other relatives Type of Residence: Private residence (3 steps) Insurance Resources: Chartered certified accountant Resources: Radio broadcast assistant Screen Referred: No Living Expenses: Lives with family Money Management: Family Does the patient have any problems obtaining your medications?: No Home Management: Assistance from daughter Patient/Family Preliminary Plans: Daughter anticpates to continue Care Coordinator  Barriers to Discharge: Inaccessible home environment, Home environment access/layout, Decreased caregiver support, Insurance for SNF coverage Care Coordinator Barriers to Discharge Comments: 20 days remaining for SNF Care Coordinator Anticipated Follow Up Needs: HH/OP  Expected length of stay: 12-14 Days  Clinical Impression SW met with patient, introduced self and explained role. Patient previously living with daughter, supervision (household discharge with rollator) and receiving assistance with ADLS, with limited visits in the community. Daughter anticipates for patient to return to her home with her able to assist Min A overall. Daughter have 3 steps to enter their home and patient currently has a rollator, SPC, grab bars, toilet riser and TTB. Daughter able to provide 24/7 supervision. Patient have 20 remaining SNF days, if needed. No additional questions or concerns.   [name] 03/21/2022, 1:12 PM

## 2022-03-21 NOTE — Evaluation (Signed)
Occupational Therapy Assessment and Plan  Patient Details  Name: Bridget [name] MRN: [id number] Date of Birth: [birth year]  OT Diagnosis: abnormal posture, acute pain, ataxia, cognitive deficits, hemiplegia affecting dominant side, muscular wasting and disuse atrophy, muscle weakness (generalized), pain in joint, and swelling of limb Rehab Potential: Rehab Potential (ACUTE ONLY): Fair ELOS: 2-3 weeks   Today's Date: 03/21/2022 OT Individual Time: [id number] OT Individual Time Calculation (min): 74 min     Hospital Problem: Principal Problem:   Ischemic stroke of frontal lobe ([place]) Active Problems:   Embolic cerebral infarction A [facility])   Past Medical History:  Past Medical History:  Diagnosis Date   Atherosclerosis of aorta ([place])    Atrial fibrillation ([place])    Breast cancer ([place])    Chronic Bronchitis    Coronary atherosclerosis due to calcified coronary lesion    Nocturnal polyuria    Stroke ([place])    Thrombus of left atrial appendage    Past Surgical History:  Past Surgical History:  Procedure Laterality Date   BREAST LUMPECTOMY Left 2001   FEMUR FRACTURE SURGERY Right 2019   RIGHT - 2015   TOTAL ABDOMINAL HYSTERECTOMY  1967    Assessment & Plan Clinical Impression: Bridget [name] is a 89 year old right-handed female with history of  moderatdementia maintained on Namenda requiring 24/7 assistance, diet-controlled diabetes mellitus, atrial fibrillation maintained on Eliquis with thrombus of atrial appendage, prior history of CVA with no residual deficits, history of breast cancer with lumpectomy.  Per chart review patient lives with daughter and granddaughter with 24-hour assistance as needed.  Used a rollator at baseline for limited household ambulation.  1 level home with 3 steps to entry.  Presented 03/15/2022 with right-sided facial droop and slurred speech of acute onset.  CT/MRI showed acute infarct in the left posterior frontal cortex including the precentral gyrus.  Additional  smaller subacute infarcts in the more anterior left frontal lobe.  Patient did not receive tPA and maintained on Eliquis.  CT angiogram head and neck with no intracranial large vessel occlusion.  No hemodynamically significant stenosis in the neck.  CT of the abdomen/pelvis due to abdominal pain showed no acute intra-abdominal pathology no bowel obstruction.  Admission chemistries unremarkable except sodium 130, chloride 95, urine culture greater 100,000 Proteus Mirabalis.  Echocardiogram ejection fraction of 55 to 60% no wall motion abnormalities.  Neurology follow-up patient remains on Eliquis as prior to admission.  Rocephin was added for UTI.  Tolerating a regular consistency diet.  Therapy evaluations completed due to patient decreased functional mobility/dysarthria was admitted for a comprehensive rehab program.   Per pt's daughter, she forms an Astronomical amount of urine" at night- can soak bed multiple times- she wakes her up time void her at home, however since stroke, daughter emphatic wants purewick.  Usually walks with rollator/prior to stroke; and does 3 steps with cane- also uses brief at home to void- doesn't know when needs to go. Patient transferred to CIR on 03/20/2022 .    Patient currently requires max-total with basic self-care skills secondary to muscle weakness and muscle joint tightness, decreased cardiorespiratoy endurance, impaired timing and sequencing, unbalanced muscle activation, motor apraxia, ataxia, decreased coordination, and decreased motor planning, decreased attention to right and decreased motor planning, decreased initiation, decreased attention, decreased awareness, decreased problem solving, decreased safety awareness, decreased memory, and delayed processing, and decreased sitting balance, decreased standing balance, decreased postural control, hemiplegia, and decreased balance strategies.  Prior to hospitalization, patient could complete BADLs with min.  Patient  will benefit from skilled intervention to decrease level of assist with basic self-care skills, increase independence with basic self-care skills, and increase level of independence with iADL prior to discharge home with care partner.  Anticipate patient will require 24 hour supervision and follow up home health.  OT - End of Session Activity Tolerance: Decreased this session Endurance Deficit: Yes OT Assessment Rehab Potential (ACUTE ONLY): Fair OT Patient demonstrates impairments in the following area(s): Balance;Safety;Perception;Sensory;Cognition;Skin Integrity;Endurance;Motor;Pain;Behavior OT Basic ADL's Functional Problem(s): Grooming;Bathing;Dressing;Toileting;Eating OT Transfers Functional Problem(s): Toilet;Tub/Shower OT Additional Impairment(s): Fuctional Use of Upper Extremity OT Plan OT Intensity: Minimum of 1-2 x/day, 45 to 90 minutes OT Frequency: 5 out of 7 days OT Duration/Estimated Length of Stay: 2-3 weeks OT Treatment/Interventions: Balance/vestibular training;Discharge planning;Functional electrical stimulation;Self Care/advanced ADL retraining;Therapeutic Activities;UE/LE Coordination activities;Cognitive remediation/compensation;Disease mangement/prevention;Functional mobility training;Patient/family education;Skin care/wound managment;Therapeutic Exercise;Visual/perceptual remediation/compensation;UE/LE Strength taining/ROM;Splinting/orthotics;Psychosocial support;Neuromuscular re-education;DME/adaptive equipment instruction;Community reintegration;Wheelchair propulsion/positioning;Pain management OT Self Feeding Anticipated Outcome(s): Supervision OT Basic Self-Care Anticipated Outcome(s): Min A OT Toileting Anticipated Outcome(s): Min A OT Bathroom Transfers Anticipated Outcome(s): Min A OT Recommendation Patient destination: Home Follow Up Recommendations: 24 hour supervision/assistance Equipment Recommended: 3 in 1 bedside comode;To be determined   OT  Evaluation Precautions/Restrictions  Precautions Precautions: Fall Precaution Comments: watch HR, HOH Restrictions Weight Bearing Restrictions: No General Chart Reviewed: Yes Additional Pertinent History: diet-controlled diabetes mellitus, atrial fibrillation maintained on Eliquis, breast cancer wth lumpectomy Family/Caregiver Present: Yes (DTR and great grand DTR) Vital Signs  Pain Pain Assessment Pain Scale: 0-10 Pain Score: 0-No pain Pain Type: Acute pain Pain Location: Neck Pain Orientation: Mid;Lower Pain Radiating Towards: shoulders Pain Descriptors / Indicators: Aching Pain Frequency: Intermittent Pain Onset: Gradual Patients Stated Pain Goal: 0 Pain Intervention(s): Medication (See eMAR) Home Living/Prior [place] expects to be discharged to:: Private residence Living Arrangements: Children Available Help at Discharge: Family, Available 24 hours/day Type of Home: House Home Access: Stairs to enter Technical brewer of Steps: 3 Entrance Stairs-Rails: Left Home Layout: One level Bathroom Shower/Tub: Tub/shower unit (uses pivot set to get into tub) Bathroom Toilet: Handicapped height Bathroom Accessibility: Yes Additional Comments: Daughter Kennyth Lose provides caregiving and home environment  Lives With: Family, Daughter IADL History Homemaking Responsibilities: No Current License: No Mode of Transportation: Family Occupation: Retired Prior Function Level of Independence: Needs assistance with ADLs, Needs assistance with gait, Needs assistance with tranfers Bath: Moderate Toileting: Minimal Dressing: Moderate Grooming: Minimal  Able to Take Stairs?: Yes Driving: No Vocation: Retired Surveyor, mining Baseline Vision/History: 1 Wears glasses Ability to See in Adequate Light: 2 Moderately impaired Patient Visual Report: No change from baseline Vision Assessment?: Yes Tracking/Visual Pursuits: Decreased smoothness of horizontal  tracking;Decreased smoothness of vertical tracking Saccades: Decreased speed of saccadic movement Convergence: Within functional limits Perception  Perception: Impaired Praxis Praxis: Impaired Praxis Impairment Details: Motor planning Cognition Cognition Overall Cognitive Status: History of cognitive impairments - at baseline Orientation Level: Person Memory: Impaired (dementia at baseline) Memory Impairment: Storage deficit;Decreased long term memory;Decreased short term memory;Retrieval deficit Decreased Long Term Memory: Functional basic Decreased Short Term Memory: Functional basic Attention: Selective Selective Attention: Impaired Selective Attention Impairment: Functional basic Awareness: Impaired Awareness Impairment: Emergent impairment Problem Solving: Impaired Problem Solving Impairment: Functional basic Executive Function:  (all impaired) Safety/Judgment: Impaired Brief Interview for Mental Status (BIMS) Repetition of Three Words (First Attempt): None Temporal Orientation: Year: No answer Temporal Orientation: Month: Missed by more than 1 month Temporal Orientation: Day: No answer Recall: "Sock": No, could not recall Recall: "Blue": No, could not recall  Recall: "Bed": No, could not recall BIMS Summary Score: 0 Sensation Sensation Light Touch: Impaired by gross assessment Hot/Cold: Appears Intact Proprioception: Not tested Stereognosis: Not tested Coordination Gross Motor Movements are Fluid and Coordinated: No Fine Motor Movements are Fluid and Coordinated: No Finger Nose Finger Test: Unable to complete with RUE; challenges following directions for tasks 9 Hole Peg Test: Not completed d/t decreased RUE function Motor  Motor Motor: Hemiplegia;Abnormal tone;Motor apraxia;Ataxia Motor - Skilled Clinical Observations: Pt presenting with R sided weakness and fatigue this session with deficits  Trunk/Postural Assessment  Cervical Assessment Cervical Assessment:  Exceptions to [facility] (forward head) Thoracic Assessment Thoracic Assessment: Exceptions to [facility] (kyphotic posture) Lumbar Assessment Lumbar Assessment: Exceptions to [facility] (posterior pelvic tilt) Postural Control Postural Control: Deficits on evaluation Trunk Control: decreased Righting Reactions: decreased Protective Responses: delayed  Balance Balance Balance Assessed: Yes Static Sitting Balance Static Sitting - Balance Support: Feet supported Static Sitting - Level of Assistance: 5: Stand by assistance (CGA) Dynamic Sitting Balance Dynamic Sitting - Balance Support: Feet supported Dynamic Sitting - Level of Assistance: 4: Min assist Dynamic Sitting - Balance Activities:  (ADLs) Sitting balance - Comments: CGA to Min a required with Pt presenting with posterior lean Static Standing Balance Static Standing - Level of Assistance: 2: Max assist;3: Mod assist Static Standing - Comment/# of Minutes: ~20 seconds Dynamic Standing Balance Dynamic Standing - Balance Support: Left upper extremity supported Dynamic Standing - Level of Assistance: 2: Max assist Dynamic Standing - Balance Activities:  (ADLs) Extremity/Trunk Assessment RUE Assessment RUE Assessment: Exceptions to [facility] Active Range of Motion (AROM) Comments: ROM > proximally vs. distally, 2/5 grossly however difficult to assess d/t Pt cognitive deficits and unwillingness to attempt arm movements, 3-/5 at tricep/bicep, 3-/5 shoudler level LUE Assessment LUE Assessment: Within Functional Limits General Strength Comments: decreased strength d/t general deconditioning  Care Tool Care Tool Self Care Eating   Eating Assist Level: Moderate Assistance - Patient 50 - 74%    Oral Care    Oral Care Assist Level: Moderate Assistance - Patient 50 - 74%    Bathing   Body parts bathed by patient: Right arm;Face Body parts bathed by helper: Buttocks;Left arm;Front perineal area;Right upper leg;Left upper leg;Abdomen;Chest;Left lower  leg;Right lower leg   Assist Level: Total Assistance - Patient < 25%    Upper Body Dressing(including orthotics)   What is the patient wearing?: Pull over shirt;Button up shirt   Assist Level: Maximal Assistance - Patient 25 - 49%    Lower Body Dressing (excluding footwear)   What is the patient wearing?: Pants;Underwear/pull up Assist for lower body dressing: Total Assistance - Patient < 25%    Putting on/Taking off footwear   What is the patient wearing?: Socks;Shoes Assist for footwear: Total Assistance - Patient < 25%       Care Tool Toileting Toileting activity Toileting Activity did not occur (Clothing management and hygiene only): N/A (no void or bm) Assist for toileting: Total Assistance - Patient < 25% (simulated)     Care Tool Bed Mobility Roll left and right activity   Roll left and right assist level: Maximal Assistance - Patient 25 - 49%    Sit to lying activity   Sit to lying assist level: Maximal Assistance - Patient 25 - 49%    Lying to sitting on side of bed activity   Lying to sitting on side of bed assist level: the ability to move from lying on the back to sitting on the side of  the bed with no back support.: Maximal Assistance - Patient 25 - 49%     Care Tool Transfers Sit to stand transfer   Sit to stand assist level: Maximal Assistance - Patient 25 - 49%    Chair/bed transfer         Toilet transfer   Assist Level: Total Assistance - Patient < 25%     Care Tool Cognition  Expression of Ideas and Wants Expression of Ideas and Wants: 3. Some difficulty - exhibits some difficulty with expressing needs and ideas (e.g, some words or finishing thoughts) or speech is not clear  Understanding Verbal and Non-Verbal Content Understanding Verbal and Non-Verbal Content: 3. Usually understands - understands most conversations, but misses some part/intent of message. Requires cues at times to understand   Memory/Recall Ability Memory/Recall Ability : That he  or she is in a hospital/hospital unit   Refer to Care Plan for Long Term Goals  SHORT TERM GOAL WEEK 1 OT Short Term Goal 1 (Week 1): Pt will feed self with min A ~50% of meal using adaptive strategies and AD PRN OT Short Term Goal 2 (Week 1): Pt will complete squat pivot transfers with mod A consistently OT Short Term Goal 3 (Week 1): Pt will complete LB dressing mod A  Recommendations for other services: None    Skilled Therapeutic Intervention Skilled Therapeutic Intervention/progress Updates:  1:1 OT evaluation and intervention initiated with skilled education provided on OT role, goals, and POC. Pt received supine in bed with DTR and great grand DTR present in room with Pt agreeable to skilled OT session and Pt reporting un-rated pain in RUE with Rn in/out during session to provided morning medications. MD in/out during session for morning rounds. Pt noted to have had BM in bed upon OT arrival. Pt completed rolls R/L in bed to assist with clean up max A. Pt completed BADLs this session at levels listed below with sponge bathing and dressing completed seated EOB. Pt hypersensitive to pain during session with therapeutic support provided. Skilled interventions including functional transfer training, family education, BADL retraining, activity modification, and pain management. Pt would benefit from continued OT services. Pt left resting in wc with call bell in reach, seat belt alarm on, family present in room, and all needs met.  ADL ADL Eating: Minimal assistance;Moderate assistance Where Assessed-Eating: Bed level Grooming: Moderate assistance Where Assessed-Grooming: Edge of bed Upper Body Bathing: Maximal assistance Where Assessed-Upper Body Bathing: Edge of bed Lower Body Bathing: Maximal assistance Where Assessed-Lower Body Bathing: Edge of bed Upper Body Dressing: Maximal assistance Where Assessed-Upper Body Dressing: Edge of bed Lower Body Dressing: Dependent;Maximal  assistance Where Assessed-Lower Body Dressing: Edge of bed Toileting: Maximal assistance;Dependent (simulated) Where Assessed-Toileting: Bedside Commode Toilet Transfer: Dependent Toilet Transfer Method:  (+2 stabilizing wc for safety) Science writer: Radiographer, therapeutic: Not assessed Social research officer, government: Dependent (simulated) Social research officer, government Method: Education officer, environmental: Shower seat with back Mobility  Bed Mobility Bed Mobility: Rolling Right;Rolling Left;Supine to Sit Rolling Right: Maximal Assistance - Patient 25-49%;Total Assistance - Patient < 25% Rolling Left: Total Assistance - Patient < 25%;Maximal Assistance - Patient 25-49% Supine to Sit: Maximal Assistance - Patient - Patient 25-49% Transfers Sit to Stand: Maximal Assistance - Patient 25-49% Stand to Sit: Maximal Assistance - Patient 25-49%   Discharge Criteria: Patient will be discharged from OT if patient refuses treatment 3 consecutive times without medical reason, if treatment goals not met, if there is a change  in medical status, if patient makes no progress towards goals or if patient is discharged from hospital.  The above assessment, treatment plan, treatment alternatives and goals were discussed and mutually agreed upon: by patient and by family  Janey Genta 03/21/2022, 12:37 PM

## 2022-03-21 NOTE — Evaluation (Signed)
Physical Therapy Assessment and Plan  Patient Details  Name: Bridget [name] MRN: [id number] Date of Birth: 1921-12-20  PT Diagnosis: Abnormal posture, Abnormality of gait, Cognitive deficits, Coordination disorder, Difficulty walking, Hemiplegia dominant, Impaired sensation, and Muscle weakness Rehab Potential: Fair ELOS: 2.5-3 weeks   Today's Date: 03/21/2022 PT Individual Time: 1030-1150 PT Individual Time Calculation (min): 80 min    Hospital Problem: Principal Problem:   Ischemic stroke of frontal lobe ([place]) Active Problems:   Embolic cerebral infarction [facility])   Past Medical History:  Past Medical History:  Diagnosis Date   Atherosclerosis of aorta ([place])    Atrial fibrillation ([place])    Breast cancer ([place])    Chronic Bronchitis    Coronary atherosclerosis due to calcified coronary lesion    Nocturnal polyuria    Stroke ([place])    Thrombus of left atrial appendage    Past Surgical History:  Past Surgical History:  Procedure Laterality Date   BREAST LUMPECTOMY Left 2001   FEMUR FRACTURE SURGERY Right 2019   RIGHT - 2015   TOTAL ABDOMINAL HYSTERECTOMY  1967    Assessment & Plan Clinical Impression: Patient is a 89 y.o. year old right-handed female with history of  moderatdementia maintained on Namenda requiring 24/7 assistance, diet-controlled diabetes mellitus, atrial fibrillation maintained on Eliquis with thrombus of atrial appendage, prior history of CVA with no residual deficits, history of breast cancer with lumpectomy.  Per chart review patient lives with daughter and granddaughter with 24-hour assistance as needed.  Used a rollator at baseline for limited household ambulation.  1 level home with 3 steps to entry.  Presented 03/15/2022 with right-sided facial droop and slurred speech of acute onset.  CT/MRI showed acute infarct in the left posterior frontal cortex including the precentral gyrus.  Additional smaller subacute infarcts in the more anterior left frontal lobe.   Patient did not receive tPA and maintained on Eliquis.  CT angiogram head and neck with no intracranial large vessel occlusion.  No hemodynamically significant stenosis in the neck.  CT of the abdomen/pelvis due to abdominal pain showed no acute intra-abdominal pathology no bowel obstruction.  Admission chemistries unremarkable except sodium 130, chloride 95, urine culture greater 100,000 Proteus Mirabalis.  Echocardiogram ejection fraction of 55 to 60% no wall motion abnormalities.  Neurology follow-up patient remains on Eliquis as prior to admission.  Rocephin was added for UTI.  Tolerating a regular consistency diet.  Therapy evaluations completed due to patient decreased functional mobility/dysarthria was admitted for a comprehensive rehab program. .  Patient transferred to CIR on 03/20/2022 .   Patient currently requires max with mobility secondary to muscle weakness, decreased cardiorespiratoy endurance, motor apraxia, decreased coordination, and decreased motor planning, decreased midline orientation, decreased problem solving, decreased safety awareness, decreased memory, and delayed processing, and decreased sitting balance, decreased standing balance, decreased postural control, hemiplegia, and decreased balance strategies.  Prior to hospitalization, patient was modified independent  with mobility and lived with Family, Daughter in a House home.  Home access is 3Stairs to enter.  Patient will benefit from skilled PT intervention to maximize safe functional mobility, minimize fall risk, and decrease caregiver burden for planned discharge home with 24 hour assist.  Anticipate patient will benefit from follow up [facility] at discharge.  PT - End of Session Activity Tolerance: Tolerates 10 - 20 min activity with multiple rests Endurance Deficit: Yes PT Assessment Rehab Potential (ACUTE/IP ONLY): Fair PT Barriers to Discharge: Inaccessible home environment;Decreased caregiver support;Home environment  access/layout;Incontinence;Behavior PT Barriers to  Discharge Comments: 3 STE w/ reduced caregiver physical capabilities PT Patient demonstrates impairments in the following area(s): Balance;Safety;Behavior;Sensory;Endurance;Motor;Perception;Pain PT Transfers Functional Problem(s): Bed Mobility;Bed to Chair;Car;Furniture PT Locomotion Functional Problem(s): Ambulation;Wheelchair Mobility;Stairs PT Plan PT Intensity: Minimum of 1-2 x/day ,45 to 90 minutes PT Frequency: 5 out of 7 days PT Duration Estimated Length of Stay: 2.5-3 weeks PT Treatment/Interventions: Ambulation/gait training;Community reintegration;DME/adaptive equipment instruction;Neuromuscular re-education;Stair training;Psychosocial support;UE/LE Strength taining/ROM;Wheelchair propulsion/positioning;UE/LE Coordination activities;Therapeutic Activities;Pain management;Discharge planning;Functional electrical stimulation;Balance/vestibular training;Cognitive remediation/compensation;Disease management/prevention;Patient/family education;Functional mobility training;Splinting/orthotics;Therapeutic Exercise;Visual/perceptual remediation/compensation PT Transfers Anticipated Outcome(s): Min A w/ LRAD PT Locomotion Anticipated Outcome(s): Mod A w/ LRAD PT Recommendation Recommendations for Other Services: Therapeutic Recreation consult Therapeutic Recreation Interventions: Pet therapy;Kitchen group;Stress management;Outing/community reintergration Follow Up Recommendations: Home health PT;24 hour supervision/assistance Patient destination: Home Equipment Recommended: To be determined   PT Evaluation Precautions/Restrictions Precautions Precautions: Fall Precaution Comments: watch HR, HOH, R-hemi, Restrictions Weight Bearing Restrictions: No General   Vital Signs Pain Pain Assessment Pain Scale: 0-10 Pain Score: 0-No pain Pain Type: Acute pain Pain Location: Neck Pain Orientation: Mid;Lower Pain Radiating Towards:  shoulders Pain Descriptors / Indicators: Aching Pain Frequency: Intermittent Pain Onset: Gradual Patients Stated Pain Goal: 0 Pain Intervention(s): Medication (See eMAR) Pain Interference Pain Interference Pain Effect on Sleep: 1. Rarely or not at all Pain Interference with Therapy Activities: 1. Rarely or not at all Pain Interference with Day-to-Day Activities: 1. Rarely or not at all Home Living/Prior [place] Living Arrangements: Children Available Help at Discharge: Family;Available 24 hours/day Type of Home: House Home Access: Stairs to enter [employer] of Steps: 3 Entrance Stairs-Rails: Left Home Layout: One level Bathroom Shower/Tub: Tub/shower unit (uses pivot set to get into tub) Bathroom Toilet: Handicapped height Bathroom Accessibility: Yes Additional Comments: Daughter Kennyth Lose provides caregiving and home environment  Lives With: Family;Daughter Prior Function Level of Independence: Needs assistance with ADLs;Needs assistance with gait;Needs assistance with tranfers Bath: Moderate Toileting: Minimal Dressing: Moderate Grooming: Minimal  Able to Take Stairs?: Yes Driving: No Vocation: Retired Vision/Perception  Vision - History Ability to See in Adequate Light: 2 Moderately impaired Vision - Assessment Tracking/Visual Pursuits: Decreased smoothness of horizontal tracking;Decreased smoothness of vertical tracking Saccades: Decreased speed of saccadic movement Convergence: Within functional limits Perception Perception: Impaired Praxis Praxis: Impaired Praxis Impairment Details: Motor planning  Cognition Overall Cognitive Status: History of cognitive impairments - at baseline Orientation Level: Oriented to person;Disoriented to place;Disoriented to time;Disoriented to situation Attention: Selective Selective Attention: Impaired Selective Attention Impairment: Functional basic Memory: Impaired (dementia at baseline) Memory  Impairment: Storage deficit;Decreased long term memory;Decreased short term memory;Retrieval deficit Decreased Long Term Memory: Functional basic Decreased Short Term Memory: Functional basic Awareness: Impaired Awareness Impairment: Emergent impairment Problem Solving: Impaired Problem Solving Impairment: Functional basic Executive Function:  (all impaired) Safety/Judgment: Impaired Sensation Sensation Light Touch: Impaired by gross assessment Hot/Cold: Appears Intact Proprioception: Not tested Stereognosis: Not tested Coordination Gross Motor Movements are Fluid and Coordinated: No Fine Motor Movements are Fluid and Coordinated: No Finger Nose Finger Test: Unable to complete with RUE; challenges following directions for tasks 9 Hole Peg Test: Not completed d/t decreased RUE function Motor  Motor Motor: Hemiplegia;Abnormal tone;Motor apraxia;Ataxia Motor - Skilled Clinical Observations: Pt presenting with R sided weakness and fatigue this session with deficits   Trunk/Postural Assessment  Cervical Assessment Cervical Assessment: Exceptions to [facility] (forward head) Thoracic Assessment Thoracic Assessment: Exceptions to [facility] (kyphotic posture) Lumbar Assessment Lumbar Assessment: Exceptions to [facility] (posterior pelvic tilt) Postural Control Postural Control: Deficits on evaluation Trunk Control: decreased Righting  Reactions: decreased Protective Responses: delayed  Balance Balance Balance Assessed: Yes Static Sitting Balance Static Sitting - Balance Support: Feet supported Static Sitting - Level of Assistance: 5: Stand by assistance Dynamic Sitting Balance Dynamic Sitting - Balance Support: Feet supported Dynamic Sitting - Level of Assistance: 4: Min assist Dynamic Sitting - Balance Activities:  (ADLs) Sitting balance - Comments: CGA to Min a required with Pt presenting with posterior lean Static Standing Balance Static Standing - Level of Assistance: 2: Max assist Static  Standing - Comment/# of Minutes: ~20 seconds Dynamic Standing Balance Dynamic Standing - Balance Support: Left upper extremity supported Dynamic Standing - Level of Assistance: 2: Max assist Dynamic Standing - Balance Activities:  (ADLs) Extremity Assessment  RUE Assessment RUE Assessment: Exceptions to [facility] Active Range of Motion (AROM) Comments: ROM > proximally vs. distally, 2/5 grossly however difficult to assess d/t Pt cognitive deficits and unwillingness to attempt arm movements, 3-/5 at tricep/bicep, 3-/5 shoudler level LUE Assessment LUE Assessment: Within Functional Limits General Strength Comments: decreased strength d/t general deconditioning RLE Assessment RLE Assessment: Exceptions to [facility] RLE Strength RLE Overall Strength: Deficits Right Hip Flexion: 3-/5 Right Hip ABduction: 3/5 Right Hip ADduction: 4-/5 Right Knee Flexion: 3/5 Right Knee Extension: 3+/5 Right Ankle Dorsiflexion: 3/5 LLE Assessment LLE Assessment: Exceptions to WFL LLE Strength LLE Overall Strength: Deficits Left Hip Flexion: 3+/5 Left Hip ABduction: 4/5 Left Hip ADduction: 4/5 Left Knee Flexion: 4-/5 Left Knee Extension: 4-/5 Left Ankle Dorsiflexion: 4-/5  Care Tool Care Tool Bed Mobility Roll left and right activity   Roll left and right assist level: Maximal Assistance - Patient 25 - 49%    Sit to lying activity   Sit to lying assist level: Maximal Assistance - Patient 25 - 49%    Lying to sitting on side of bed activity   Lying to sitting on side of bed assist level: the ability to move from lying on the back to sitting on the side of the bed with no back support.: Maximal Assistance - Patient 25 - 49%     Care Tool Transfers Sit to stand transfer   Sit to stand assist level: Maximal Assistance - Patient 25 - 49%    Chair/bed transfer   Chair/bed transfer assist level: Maximal Assistance - Patient 25 - 49%     Toilet transfer   Assist Level: Maximal Assistance - Patient 24 - 49%     Car transfer Car transfer activity did not occur: Safety/medical concerns        Care Tool Locomotion Ambulation Ambulation activity did not occur: Safety/medical concerns        Walk 10 feet activity Walk 10 feet activity did not occur: Safety/medical concerns       Walk 50 feet with 2 turns activity Walk 50 feet with 2 turns activity did not occur: Safety/medical concerns      Walk 150 feet activity Walk 150 feet activity did not occur: Safety/medical concerns      Walk 10 feet on uneven surfaces activity Walk 10 feet on uneven surfaces activity did not occur: Safety/medical concerns      Stairs Stair activity did not occur: Safety/medical concerns        Walk up/down 1 step activity Walk up/down 1 step or curb (drop down) activity did not occur: Safety/medical concerns      Walk up/down 4 steps activity Walk up/down 4 steps activity did not occur: Safety/medical concerns      Walk up/down 12 steps activity Walk  up/down 12 steps activity did not occur: Safety/medical concerns      Pick up small objects from floor Pick up small object from the floor (from standing position) activity did not occur: Safety/medical concerns      Wheelchair Is the patient using a wheelchair?: Yes Type of Wheelchair: Manual   Wheelchair assist level: Dependent - Patient 0% Max wheelchair distance: 239ft  Wheel 50 feet with 2 turns activity   Assist Level: Dependent - Patient 0%  Wheel 150 feet activity   Assist Level: Dependent - Patient 0%    Refer to Care Plan for Long Term Goals  SHORT TERM GOAL WEEK 1 PT Short Term Goal 1 (Week 1): Pt will perform transfers w/ Mod A using LRAD PT Short Term Goal 2 (Week 1): Pt will tolerate standing for >2 minutes PT Short Term Goal 3 (Week 1): Pt will intiate gait of at [place] 5 feet  Recommendations for other services: Therapeutic Recreation  Pet therapy, Kitchen group, Stress management, and Outing/community reintegration  Skilled  Therapeutic Intervention Mobility Bed Mobility Bed Mobility: Rolling Right;Rolling Left;Supine to Sit Rolling Right: Maximal Assistance - Patient 25-49%;Total Assistance - Patient < 25% Rolling Left: Total Assistance - Patient < 25%;Maximal Assistance - Patient 25-49% Supine to Sit: Maximal Assistance - Patient - Patient 25-49% Transfers Transfers: Sit to Stand;Stand to Sit;Transfer via [place] to Stand: Maximal Assistance - Patient 25-49% Stand to Sit: Maximal Assistance - Patient 25-49% Squat Pivot Transfers: Maximal Assistance - Patient 25-49% Transfer (Assistive device): Rolling walker Transfer via Lift Equipment: Probation officer Ambulation: No Gait Gait: No Stairs / Additional Locomotion Stairs: No Wheelchair Mobility Wheelchair Mobility: Yes Wheelchair Assistance: Dependent - Patient 0% Wheelchair Parts Management: Needs assistance Distance: 275ft  Pt received sitting upright in [facility] w/ daughter present. Pt agreeable to PT services. New patient evaluation performed by therpaist as noted above. Due to pt's hx of dementia and subsequent CVA impairment, the pt's daughter provided therapist w/ the relevant pt hx and home environment.   Pt transported to main therapy gym dependently in [place]. Sit <>stand 2x w/ Max A x 2. Mod vc and tactile cueing for the motor planning. Pt instructed to use LUE to and push up from [facility] arm rest due to R sided hemi and pt reporting discomfort throughout RUE. Therapists providing B anterior knee block to facilitate knee extension and anterior shift at pelvis to facilitate hip extension to assist in pt coming to complete stand. Mod Vc for pt to keep head up to facilitate trunk extension. Each time, pt tolerated standing for ~1 minute before requesting to sit down.   Pt transported back to room dependently in order to use the restroom. Pt transferred to toilet using [place] x2 for assist. Pt required max vc to come to standing in the [place]. Pt  was adamant about not being able to stand, however, w/ encouragement from therapists and pt's daughter pt was able to stand w/ Max A x2. Pillow was placed in front of pt's knees to limit discomfort. Pt Mod A x 1 to control descent on to toilet. Pt required total assist for pericare. Using [facility] x2, performed sit<>stand from toilet and was returned to sitting in [place]. Pt rests in increased hip IR on the R side > the L in standing and sitting.   Pt was left sitting in [facility] w/ chair alarm on, call bell in reach, and w/ daughter present.   Discharge Criteria: Patient will be discharged from  PT if patient refuses treatment 3 consecutive times without medical reason, if treatment goals not met, if there is a change in medical status, if patient makes no progress towards goals or if patient is discharged from hospital.  The above assessment, treatment plan, treatment alternatives and goals were discussed and mutually agreed upon: by patient and by family  Jillaine Waren 03/21/2022, 12:49 PM

## 2022-03-21 NOTE — Progress Notes (Signed)
Occupational Therapy Session Note  Patient Details  Name: Bridget [name] MRN: [id number] Date of Birth: 22-Oct-1921  Today's Date: 03/21/2022 OT Individual Time: [id number] OT Individual Time Calculation (min): 70 min    Short Term Goals: Week 1:  OT Short Term Goal 1 (Week 1): Pt will feed self with min A ~50% of meal using adaptive strategies and AD PRN OT Short Term Goal 2 (Week 1): Pt will complete squat pivot transfers with mod A consistently OT Short Term Goal 3 (Week 1): Pt will complete LB dressing mod A  Skilled Therapeutic Interventions/Progress Updates:  Pt greeted seated in w/c, pt initially declining session but with encouragement from daughter pt agreeable. Donned R elbow guard to protect skin d/t impaired skin integrity and impaired proprioception in RUE.   FAST-UL Outcome Measure  Hand-to-mouth (HtM) Movement Starting Position: Participant seated on a standard chair without armrests. Trunk leaning on back support of chair. Both hands placed in pronated position on the ipsilateral middle thigh. Feet placed flat on the floor. If participants have any difficulty in understanding instructions (i.e. aphasia) a visual demonstration is suggested. For each of the 5 tasks of the FAST-UL, the subject at first performs the movement with the less affected UL and then with the affected one. The movement can be repeated 3 times and the best score of the three attempts is assigned.   Instructions: Each subject is asked to move the hand towards the mouth, touch it with fingertips and return to the thigh. Motor task occurs without moving the trunk off the back support and without moving the head toward the hand.   Scoring: Clinical score from 0 to 3 is provided by comparing affected side with less affected one as follows: 0 = no movement at all. 1 = The movement task is not completed (less of 50% of the contralateral HtM movement). 2 = The movement task is not completed (more of 50% of the  contralateral HtM movement but the mouth is not reached) or the movement task is completed with compensations. If the mouth is touched with the wrist or the palm or the movement is performed with head or trunk compensations (flexion of the head and trunk towards the hand) the score is 2.   3 = movement carried out at 100% of the contralateral HtM movement. HtM occurs with adequate shoulder flexion and abduction, elbow flexion, and forearm supination. The mouth is touched with fingertips.  Patient Score: 3   Reach to Target (RtT) Movement Starting Position: Same starting conditions of HtM movement. Instructions: Each subject is asked to move the hand toward a target (i.e. the hand of the examiner) located in front of the subject in the ipsilateral workspace at shoulder height, at a distance corresponding to 100% of the fully extended UL within arm's reach (less affected arm as reference). Participants have to reach, touch the target, and return. Motor task occurs without moving the trunk off the back support. Scoring: Clinical score from 0 to 3 is provided by comparing affected side with less affected one as follows: 0 = no movement at all. 1 = The movement task is not completed (less of 50% of the contralateral RtT movement).  2 = The movement task is not completed (more of 50% of the contralateral RtT movement but the target is not reached) or the movement task is completed with compensations (i.e. the trunk loses contact with the back support of the chair with forward displacement, shoulder flexion occurs with excessive  scapular elevation, or shoulder excessive abduction). If the target is reached with trunk or shoulder compensations for inadequate elbow and finger extension the score is 2.  3 = movement performed at 100% of the contralateral RtT. The target is reached with adequate shoulder flexion, elbow, wrist and finger extension.  Patient Score: 2   Prono-supination (PS) Movement Starting  Position: Same starting conditions of HtM movement. Instructions: Motor task occurs without moving the trunk anteriorly or laterally, the medial side of the humerus is against the body, the forearm is fully pronated with the hand resting on the thigh. Scoring: Clinical score from 0 to 3 is provided by comparing paretic side with less affected one as follows: 0 = no movement at all. 1 = The movement task is not completed (less of 50% of the contralateral PS movement).  2 = The movement task is not completed (more of 50% of the contralateral PS movement but the forearm is not fully supinated) or the movement task is completed with compensations (i.e. excessive trunk inclination, shoulder abduction). If the movement is completed with compensations at elbow, shoulder or trunk level the score is 2. 3 = movement performed at 100% of the contralateral PS (complete supination of the forearm with the dorsal part of the hand in contact with the thigh).   Patient Score: 2   Grasp and Release (GaR) Movement Starting position: Participant seated on a standard chair. Hip and knees in 90 flexion, feet flat on the floor. Upper limb (UL) resting on a table in front of the participant with approximately 90 elbow flexion, forearm pronated and fingers in a relaxed extended and adducted position.  Instructions: The subject performs a grasping movement of a cylindrical rigid glass (at least 6 cm diameter) placed proximally to an imaginary line connecting the distal joints of thumb and index finger. The subject is asked to grasp the glass, lift it at least 2 cm (elbow remains in contact with the table), and release it. Scoring:  Clinical score from 0 to 3 is provided by comparing affected side with less affected one as follows: 0 = No movement. The grasp is not possible. 1 = The movement task is not completed (less of 50% of the task). Some prehension is possible but the grasp is not sufficiently stable to lift the  object; the grasp can be performed with the use of the less affected hand only to stabilize the glass for inadequate hand/finger opening and the release is not possible. Some hand opening is required otherwise the score is 0. 2 = The movement task is not completed (more of 50% of the task). The object is grasped and lifted but it falls or the task is completed using alternative grasping strategies (i.e. multi-pulpar, palmar, digito palmar; grasping and releasing of the object is possible with abnormal orientation of the wrist and fingers toward the object and the forearm is lifted off the table). 3 = The task is completed using the expected pattern (normal orientation of fingers or wrist toward the object, the grasp occurs with thumb and fingers in opposition, forearm supination, elbow flexion; thumb abduction and finger extension to release the object).  Patient Score: 1   Pinch and Release (PaR) Movement Starting position: Same starting conditions of GaR movement The participant performs a PaR movement of a pen placed on a table in the midline of an imaginary line connecting the distal joints of thumb and index finger. The participants asked to pinch the pen with the tips  of thumb and index finger, lift it at least 2 cm (elbow remains in contact with the table), and release it. Clinical score from 0 to 3 is provided by comparing affected side with non-affected one as follows: 0 = No movement. The pinch is not possible. 1 = The movement task is not completed (less of 50% of the task). Some prehension is possible but the pinch is not sufficiently stable to lift the object; the pinch occurs with the use of the less affected hand to stabilize the object for inadequate finger opening and the release is not possible. Some fingers movement is required otherwise the score is 0. 2 = The movement task is not completed (more of 50% of the task). The object is pinched and lifted but it falls or the task is  completed using alternative pinching strategies (e.g. pinching with all the fingers, tripod pinch, pinching and releasing of the object is possible with abnormal orientation of fingers and wrist toward the object and the forearm is lifted off the table). 3 = The task is completed using the expected pattern (normal orientation of fingers or wrist toward the object, the pinch occurs with opposition of pads of index finger and thumb, and wrist extension).  Patient Score: 1  Total score: 9  Pt needed max step by step cues to sequence through assessment. Pt transported to gym with total A for time mgmt. In gym worked on various NMR tasks for improved RUE/RLE AROM. Pt able to complete active shoulder horizontal abd/add/ scapular protraction/retraction with RUE placed on table and wash cloth placed underneath R hand. Pt even able to pronate R wrist to neutral position, pt does have active elbow flexion however difficult to fully assess as pt reports pain from her neck down.   Worked on sit>stands from w/c for RLE NMR with pt able to stand x2 with MAX A +2. Pt with impaired motor planning/initiation needing MAX cues to use LUE to push off of arm rest on chair. Pt with difficulty shifting hips anteriorly and elevating trunk into full standing position. After 2 stands pt declined any further standing work.             Ended session with pt seated in w/c with all needs within reach and safety belt alarm activated.                    Therapy Documentation Precautions:  Precautions Precautions: Fall Precaution Comments: watch HR, HOH Restrictions Weight Bearing Restrictions: No   Pain: unrated pain reported from neck down, rest breaks, repositioning and distraction/redirection all used as pain mgmt strategies.     Therapy/Group: Individual Therapy  Corinne Ports [facility] 03/21/2022, 3:49 PM

## 2022-03-21 NOTE — IPOC Note (Signed)
Overall Plan of Care [facility]) Patient Details Name: Bridget [name] MRN: [id number] DOB: [birth year]  Admitting Diagnosis: Ischemic stroke of frontal lobe 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[facility])     Functional Problem List: Nursing Bladder, Safety, Endurance, Medication Management  PT Balance, Safety, Behavior, Sensory, Endurance, Motor, Perception, Pain  OT Balance, Safety, Perception, Sensory, Cognition, Skin Integrity, Endurance, Motor, Pain, Behavior  SLP    TR         Basic ADL's: OT Grooming, Bathing, Dressing, Toileting, Eating     Advanced  ADL's: OT       Transfers: PT Bed Mobility, Bed to Chair, Car, Manufacturing systems engineer, Metallurgist: PT Ambulation, Emergency planning/management officer, Stairs     Additional Impairments: OT Fuctional Use of Upper Extremity  SLP        TR      Anticipated Outcomes Item Anticipated Outcome  Self Feeding Supervision  Swallowing      Basic self-care  Min A  Toileting  Min A   Bathroom Transfers Min A  Bowel/Bladder  manage bladder w mod I assist  Transfers  Min A w/ LRAD  Locomotion  Mod A w/ LRAD  Communication     Cognition     Pain  n/a  Safety/Judgment  manage w cues   Therapy Plan: PT Intensity: Minimum of 1-2 x/day ,45 to 90 minutes PT Frequency: 5 out of 7 days PT Duration Estimated Length of Stay: 2.5-3 weeks OT Intensity: Minimum of 1-2 x/day, 45 to 90 minutes OT Frequency: 5 out of 7 days OT Duration/Estimated Length of Stay: 2-3 weeks     Team Interventions: Nursing Interventions Bladder Management, Medication Management, Discharge Planning, Disease Management/Prevention, Patient/Family Education, Cognitive Remediation/Compensation  PT interventions Ambulation/gait training, Community reintegration, DME/adaptive equipment instruction, Neuromuscular re-education, IT trainer, Psychosocial support,  UE/LE Strength taining/ROM, Wheelchair propulsion/positioning, UE/LE Coordination activities, Therapeutic Activities, Pain management, Discharge planning, Functional electrical stimulation, Training and development officer, Cognitive remediation/compensation, Disease management/prevention, Patient/family education, Functional mobility training, Splinting/orthotics, Therapeutic Exercise, Visual/perceptual remediation/compensation  OT Interventions Balance/vestibular training, Discharge planning, Functional electrical stimulation, Self Care/advanced ADL retraining, Therapeutic Activities, UE/LE Coordination activities, Cognitive remediation/compensation, Disease mangement/prevention, Functional mobility training, Patient/family education, Skin care/wound managment, Therapeutic Exercise, Visual/perceptual remediation/compensation, UE/LE Strength taining/ROM, Splinting/orthotics, Psychosocial support, Neuromuscular re-education, DME/adaptive equipment instruction, Community reintegration, Wheelchair propulsion/positioning, Pain management  SLP Interventions    TR Interventions    SW/CM Interventions Discharge Planning, Psychosocial Support, Patient/Family Education, Disease Management/Prevention   Barriers to Discharge MD   Baseline cognitive deficits  Nursing Decreased caregiver support, Home environment access/layout 1 level/ 3ste left rail w daughter;Pt's daughter can provide up to min assist for mobility and ADLs and 24/7 care.  If pt unable to reach this level may need short term SNF.  PT Inaccessible home environment, Decreased caregiver support, Home environment access/layout, Incontinence, Behavior 3 STE w/ reduced caregiver physical capabilities  OT      SLP      SW Inaccessible home environment, Home environment access/layout, Decreased caregiver support, Insurance for SNF coverage 20 days remaining for SNF   Team Discharge Planning: Destination: PT-Home ,OT- Home , SLP-  Projected Follow-up:  PT-Home health PT, 24 hour supervision/assistance, OT-  24 hour supervision/assistance, SLP-  Projected Equipment Needs: PT-To be determined, OT- 3 in 1 bedside comode, To be determined, SLP-  Equipment Details: PT- , OT-  Patient/family involved in discharge planning: PT-  Patient, Family member/caregiver,  OT-Patient, Family member/caregiver, SLP-   MD ELOS: 14-18d Medical Rehab Prognosis:  Guarded Assessment: The patient has been admitted for CIR therapies with the diagnosis of Left frontal infarct. The team will be addressing functional mobility, strength, stamina, balance, safety, adaptive techniques and equipment, self-care, bowel and bladder mgt, patient and caregiver education, poor endurance , baseline moderate to severe cognitive deficits. Goals have been set at [facility]. Anticipated discharge destination is Home vs SNF.    See Team Conference Notes for weekly updates to the plan of care

## 2022-03-22 DIAGNOSIS — I10 Essential (primary) hypertension: Secondary | ICD-10-CM

## 2022-03-22 DIAGNOSIS — K5901 Slow transit constipation: Secondary | ICD-10-CM

## 2022-03-22 MED ORDER — SORBITOL 70 % SOLN
30.0000 mL | Freq: Every day | Status: DC | PRN
  Administered 2022-04-04: 30 mL via ORAL

## 2022-03-22 MED ORDER — SENNOSIDES-DOCUSATE SODIUM 8.6-50 MG PO TABS
1.0000 | ORAL_TABLET | Freq: Every day | ORAL | Status: DC
  Administered 2022-03-22 – 2022-04-05 (×14): 1 via ORAL
  Filled 2022-03-22 (×15): qty 1

## 2022-03-22 MED ORDER — DOCUSATE SODIUM 100 MG PO CAPS
100.0000 mg | ORAL_CAPSULE | Freq: Every day | ORAL | Status: DC
  Administered 2022-03-22 – 2022-03-30 (×9): 100 mg via ORAL
  Filled 2022-03-22 (×9): qty 1

## 2022-03-22 NOTE — Progress Notes (Signed)
Occupational Therapy Session Note  Patient Details  Name: ALITHEA AVILA MRN: [id number] Date of Birth: 1921-05-28  Today's Date: 03/22/2022 OT Individual Time: 1000-1115 OT Individual Time Calculation (min): 75 min    Short Term Goals: Week 1:  OT Short Term Goal 1 (Week 1): Pt will feed self with min A ~50% of meal using adaptive strategies and AD PRN OT Short Term Goal 2 (Week 1): Pt will complete squat pivot transfers with mod A consistently OT Short Term Goal 3 (Week 1): Pt will complete LB dressing mod A  Skilled Therapeutic Interventions/Progress Updates:    OT session focused on R-NMR, functional transfers, and activity tolerance. Pt received in w/c with daughter present, however daughter remained in room during session. Daughter informed of pt's interest in housekeeping therefore transitioned to ADL apartment for motivation with increased affect initially noted. Engaged in towel slides with RUE 3x10 reps elbow flexion/extension and shoulder adduction/abduction. Pt attended to slides for 5-7 reps demonstrating ability to initiating movements and requiring assist to complete. OT provided manual techniques to minimize compensation with trunk. Completed squat pivot transfer w/c<>mat table with max A. Engaged in reaching activity focusing on weight bearing through [place] with pt showing fair/+ tolerance. Practiced sit<>stand 1x at counter with pt able to complete with max assist, sustaining ~10 sec and quickly requesting to sit. Pt disoriented throughout session requiring reminders of hospital, stroke, and R weakness. Pt with increased confusion towards end of session and showing shutdown behaviors. Transitioned back to room with daughter as pt remained seated in w/c with all needs in reach and safety belt donned. Educated daughter on activities completed during session.   Therapy Documentation Precautions:  Precautions Precautions: Fall Precaution Comments: watch HR, HOH,  R-hemi, Restrictions Weight Bearing Restrictions: No General:   Vital Signs: Oxygen Therapy SpO2: 97 % O2 Device: Room Air Pain: Pain Assessment Pain Score: 0-No pain Faces Pain Scale: No hurt ADL: ADL Eating: Minimal assistance, Moderate assistance Where Assessed-Eating: Bed level Grooming: Moderate assistance Where Assessed-Grooming: Edge of bed Upper Body Bathing: Maximal assistance Where Assessed-Upper Body Bathing: Edge of bed Lower Body Bathing: Maximal assistance Where Assessed-Lower Body Bathing: Edge of bed Upper Body Dressing: Maximal assistance Where Assessed-Upper Body Dressing: Edge of bed Lower Body Dressing: Dependent, Maximal assistance Where Assessed-Lower Body Dressing: Edge of bed Toileting: Maximal assistance, Dependent (simulated) Where Assessed-Toileting: Bedside Commode Toilet Transfer: Dependent Toilet Transfer Method:  (+2 stabilizing wc for safety) Science writer: Radiographer, therapeutic: Not assessed Social research officer, government: Dependent (simulated) Social research officer, government Method: Education officer, environmental: Civil engineer, contracting with back Research officer, political party   Exercises:   Other Treatments:     Therapy/Group: Individual Therapy  Duayne Cal 03/22/2022, 12:14 PM

## 2022-03-22 NOTE — Progress Notes (Signed)
Physical Therapy Session Note  Patient Details  Name: Bridget [name] MRN: [id number] Date of Birth: 19-Sep-1921  Today's Date: 03/22/2022 PT Individual Time: 0805-0905 PT Individual Time Calculation (min): 60 min   Short Term Goals: Week 1:  PT Short Term Goal 1 (Week 1): Pt will perform transfers w/ Mod A using LRAD PT Short Term Goal 2 (Week 1): Pt will tolerate standing for >2 minutes PT Short Term Goal 3 (Week 1): Pt will intiate gait of at [facility] 5 feet  Skilled Therapeutic Interventions/Progress Updates:    Pt received supine in bed w/ daughter and great-grand daughter present. Pt's family had called for the nurse to remove the pt's purewick. Pt agreeable to PT services and denied any pain this morning. Nurse entered and assisted in removing pt's purewick. Therapist suggested family be present to learn the process in care they choose to use a purewick at home upon pt's discharge. Therapist assisted pt in donning bilateral tedhose, UE and LE garments. Therapists donned ted hose w/ pt in supine w/ total assist. Pants donned in supine w/ pt performing demonstrating ability to perform and mini bridge to allow therapist to slide her pants up. Pt performed supine<>sit transfer using the log roll method w/ Mod A for power up and stability. Pt sitting at EOB havd a R lateral LOB, however, was able to come back to sitting upright w/ Min A. Therapist donned pt's shoes w/ total A. Pt performed sit <>stand from EOB using RW and Max x2 for power up and standing tolerance. Pt required mod vc to stand straight up w/ cueing to lift her head up pt was able to follow, however, was unable to maintain standing upright. Pt showed decreased wb through RLE and quickly expressing the desire to sit down. Therapist guided pt through the sequence for a stand pivot in the pt's WC. With Max +2 for motor control and RW management. Pt required max vc for the sequencing of the transfer.  Pt's RUE limited due to pain. Pt cued to  place R hand on RW however expressed discomfort, so therapist managed pt's RUE during transfer. Vc required for pt to reach LUE back to [facility] armrest upon descent.   Pt left in WC at the end of session w/ chair alarm on, call bell in reach and family present.   Therapy Documentation Precautions:  Precautions Precautions: Fall Precaution Comments: watch HR, HOH, R-hemi, Restrictions Weight Bearing Restrictions: No General:   Vital Signs: Oxygen Therapy SpO2: 97 % O2 Device: Room Air Pain: pt denies any pain at the start of session, however, expresses discomfort with any ROM in RUE distally more than proximally      Therapy/Group: Individual Therapy  Amiley Shishido 03/22/2022, 11:56 AM

## 2022-03-22 NOTE — Progress Notes (Signed)
PROGRESS NOTE   Subjective/Complaints:  Doing alright this morning. Slept well per family. Some neck pain but pain meds help when she takes it (tylenol). Urinating well, using purewick at night. Unsure of LBM but it's been a couple days at least. Family concerned with her constipation. States she takes Colace and Senokot at home.  Denies any other complaints.   ROS- +constipation. Somewhat limited by cognition but denies CP, SOB, abd pain, n/v/d  Objective:   No results found. Recent Labs    03/21/22 0716  WBC 5.1  HGB 13.0  HCT 37.7  PLT 176   Recent Labs    03/21/22 0716  NA 138  K 3.8  CL 108  CO2 21*  GLUCOSE 96  BUN 13  CREATININE 0.63  CALCIUM 8.3*    Intake/Output Summary (Last 24 hours) at 03/22/2022 1231 Last data filed at 03/22/2022 0900 Gross per 24 hour  Intake 474 ml  Output 1101 ml  Net -627 ml        Physical Exam: Vital Signs Blood pressure (!) 158/58, pulse 66, temperature 98.1 F (36.7 C), resp. rate 17, height 5\' 5"  (1.651 m), weight 58.5 kg, SpO2 97 %.   General: No acute distress, pleasant, sitting in w/c Mood and affect are appropriate Heart: Regular rate and rhythm no rubs murmurs or extra sounds Lungs: Clear to auscultation, breathing unlabored, no rales or wheezes Abdomen: Positive bowel sounds, soft, very mildly tender to palpation throughout but no significant or focal tenderness, nondistended Extremities: No clubbing, cyanosis, or edema Skin: No evidence of breakdown, no evidence of rash  PRIOR EXAM: Neurologic: Cranial nerves II through XII intact, motor strength is 5/5 in left  deltoid, bicep, tricep, grip, hip flexor, knee extensors, ankle dorsiflexor and plantar flexor 3- Right delt, biceps, 2- wrist and finger flexors and extensor 3- Right HF, KE, ADF  Musculoskeletal: Full range of motion in all 4 extremities. No joint swelling, crepitus bilateral  knees   Assessment/Plan: 1. Functional deficits which require 3+ hours per day of interdisciplinary therapy in a comprehensive inpatient rehab setting. Physiatrist is providing close team supervision and 24 hour management of active medical problems listed below. Physiatrist and rehab team continue to assess barriers to discharge/monitor patient progress toward functional and medical goals  Care Tool:  Bathing    Body parts bathed by patient: Right arm, Face   Body parts bathed by helper: Buttocks, Left arm, Front perineal area, Right upper leg, Left upper leg, Abdomen, Chest, Left lower leg, Right lower leg     Bathing assist Assist Level: Total Assistance - Patient < 25%     Upper Body Dressing/Undressing Upper body dressing   What is the patient wearing?: Pull over shirt, Button up shirt    Upper body assist Assist Level: Maximal Assistance - Patient 25 - 49%    Lower Body Dressing/Undressing Lower body dressing      What is the patient wearing?: Pants, Underwear/pull up     Lower body assist Assist for lower body dressing: Total Assistance - Patient < 25%     Toileting Toileting Toileting Activity did not occur (Clothing management and hygiene only): N/A (no void or  bm)  Toileting assist Assist for toileting: Total Assistance - Patient < 25% (simulated)     Transfers Chair/bed transfer  Transfers assist     Chair/bed transfer assist level: Maximal Assistance - Patient 25 - 49%     Locomotion Ambulation   Ambulation assist   Ambulation activity did not occur: Safety/medical concerns          Walk 10 feet activity   Assist  Walk 10 feet activity did not occur: Safety/medical concerns        Walk 50 feet activity   Assist Walk 50 feet with 2 turns activity did not occur: Safety/medical concerns         Walk 150 feet activity   Assist Walk 150 feet activity did not occur: Safety/medical concerns         Walk 10 feet on uneven  surface  activity   Assist Walk 10 feet on uneven surfaces activity did not occur: Safety/medical concerns         Wheelchair     Assist Is the patient using a wheelchair?: Yes Type of Wheelchair: Manual    Wheelchair assist level: Dependent - Patient 0% Max wheelchair distance: 264ft    Wheelchair 50 feet with 2 turns activity    Assist        Assist Level: Dependent - Patient 0%   Wheelchair 150 feet activity     Assist      Assist Level: Dependent - Patient 0%   Blood pressure (!) 158/58, pulse 66, temperature 98.1 F (36.7 C), resp. rate 17, height 5\' 5"  (1.651 m), weight 58.5 kg, SpO2 97 %.  Medical Problem List and Plan: 1. 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  Antithrombotics: -DVT/anticoagulation:  Pharmaceutical: Eliquis according to pharmacy given age and GFR, will use 2.5mg  BID              -antiplatelet therapy: N/A 3. Pain Management: Tylenol as needed, muscle rub cream PRN -Hx of endstage OA of knees gets gel and cortisone injections last performed ~3wks ago per daughter 4. Mood/Behavior/Sleep: Namenda 10 mg twice daily, melatonin 3mg  QHS PRN             -antipsychotic agents: N/A 5. Neuropsych/cognition: This patient is not capable of making decisions on her own behalf. Has moderate dementia 6. Skin/Wound Care: Routine skin checks 7. Fluids/Electrolytes/Nutrition: Routine in and outs with follow-up chemistries weekly starting 03/24/22  -Cont Vit D3 400IU daily 8.  Atrial fibrillation with RVR LAA thrombus.  Continue Eliquis, Cardizem 360 mg daily.  Cardiac rate controlled 9.  UTI/Proteus Mirabalis.  Completed  course of Rocephin 10.  Hyperlipidemia.  Lipitor 80mg  QD 11.  Essential hypertension.  Continue Cardizem as directed  -03/22/22 BPs  stable, monitor Vitals:   03/20/22 1749 03/20/22 2010 03/21/22 0538 03/21/22 1414  BP: (!) 134/58 (!) 135/53 (!) 132/53 (!) 120/45   03/21/22 1951 03/22/22 0636  BP: (!) 136/59 (!) 158/58    12.  Diabetes mellitus.  Diet controlled at baseline.  Hemoglobin A1c 5.6. 13.  Chronic bronchitis.  Continue Dulera and Incruse inhalers as indicated 14. Urinary incontinence: daughter wants purewick- I think due to her issues, that's  reasonable and timed voiding q4 hours during day- since cannot get her OOB easily at night due to her stroke- hopefully will improve. 15. Constipation with incontinence- suggest Sorbitol once seen therapy, so can get her on [facility] -03/22/22 no BM in a few days per family, documented LBM 3/14, takes Colace and Senokot at home; ordered Colace 100mg  QD, Senokot 1tab QD, and Sorbitol 61ml PRN, monitor    LOS: 2 days A FACE TO [place] 03/22/2022, 12:31 PM

## 2022-03-22 NOTE — Progress Notes (Signed)
Occupational Therapy Session Note  Patient Details  Name: Bridget [name] MRN: [id number] Date of Birth: April 13, 1921  Today's Date: 03/22/2022 OT Individual Time: [id number] OT Individual Time Calculation (min): 55 min   Skilled Therapeutic Interventions/Progress Updates: Patient received sitting up in w/c visiting with her daughter. Agreeable to go with therapist for treatment session. Patient assisted by w/c to the therapy kitchen. Patient's daughter reporting she does better with exercises to mirror that of home care tasks. Stood x 4 to the sink with BUE's in support, Mod-Max of one with sit to stand and then min-mod assist to maintain standing with therapist blocking the R knee. Patient tolerated standing with BUE hands in support well. When attempting to put dishes in the cabinet above the sink patient with significant lean to the right side requiring assist to safely sit down. Patient followed dynamic standing task with RUE functional grasp and reach tasks. Patient needing hand over hand and verbal cuing re: the reason behind the repetitive reaching or grasping with the R hand. Patient participatory throughout the session, but requested to go home at the end of the session so she could start  making dinner for her family. Oriented patient to situation and place, but even back in the hospital room patient was adamant that she had thing she needed to go home and tend to. Patient left in w/c with call bell on half lap tray. Educated on use of call bell and left with chair alarm on. Continue with skilled OT poc to improve functional transfers for safe discharge home with family assist.     Therapy Documentation Precautions:  Precautions Precautions: Fall Precaution Comments: watch HR, HOH, R-hemi, Restrictions Weight Bearing Restrictions: No General:   Vital Signs: Therapy Vitals Temp: 97.6 F (36.4 C) Pulse Rate: 73 Resp: 17 BP: (!) 117/57 Patient Position (if appropriate): Sitting Oxygen  Therapy SpO2: 96 % O2 Device: Room Air Pain:No report of pain even with audible crepitus in the R knee with standing.     Therapy/Group: Individual Therapy  Hermina Barters 03/22/2022, 4:05 PM

## 2022-03-23 DIAGNOSIS — R32 Unspecified urinary incontinence: Secondary | ICD-10-CM

## 2022-03-23 NOTE — Progress Notes (Signed)
PROGRESS NOTE   Subjective/Complaints:  Pt doing alright this morning, a little mad because they were moving her around into the w/c. Family reports she had a pretty good night and slept well for about 7hrs. Had a BM this morning and multiple small ones yesterday-- still feel that she has some left to clear out, but feel she's progressing. Pain is controlled. Reports some abd pain with palpation but family states she hasn't mentioned anything else to them. Urinating well, her urine color is still amber but not cloudy. They are encouraging her to drink fluids. They understand the purewick is a temporary measure in most cases.  Denies any other complaints.   ROS- +constipation-improving, +abd discomfort with palpation. Somewhat limited by cognition but denies CP, SOB, n/v/d  Objective:   No results found. Recent Labs    03/21/22 0716  WBC 5.1  HGB 13.0  HCT 37.7  PLT 176   Recent Labs    03/21/22 0716  NA 138  K 3.8  CL 108  CO2 21*  GLUCOSE 96  BUN 13  CREATININE 0.63  CALCIUM 8.3*    Intake/Output Summary (Last 24 hours) at 03/23/2022 1156 Last data filed at 03/23/2022 0908 Gross per 24 hour  Intake 477 ml  Output 2650 ml  Net -2173 ml        Physical Exam: Vital Signs Blood pressure (!) 138/46, pulse 71, temperature 97.8 F (36.6 C), temperature source Oral, resp. rate 15, height 5\' 5"  (1.651 m), weight 58.5 kg, SpO2 94 %.   General: No acute distress, sitting in w/c, a little more irritable this morning Heart: Regular rate and rhythm no rubs murmurs or extra sounds Lungs: Clear to auscultation, breathing unlabored, no rales or wheezes Abdomen: Positive bowel sounds, soft, very mildly tender to palpation throughout but no significant or focal tenderness, nondistended, slightly firm area in the R lateral abdomen suspicious for large stool burden Extremities: No clubbing, cyanosis, or edema Skin: No evidence  of breakdown, no evidence of rash  PRIOR EXAM: Neurologic: Cranial nerves II through XII intact, motor strength is 5/5 in left  deltoid, bicep, tricep, grip, hip flexor, knee extensors, ankle dorsiflexor and plantar flexor 3- Right delt, biceps, 2- wrist and finger flexors and extensor 3- Right HF, KE, ADF  Musculoskeletal: Full range of motion in all 4 extremities. No joint swelling, crepitus bilateral knees   Assessment/Plan: 1. Functional deficits which require 3+ hours per day of interdisciplinary therapy in a comprehensive inpatient rehab setting. Physiatrist is providing close team supervision and 24 hour management of active medical problems listed below. Physiatrist and rehab team continue to assess barriers to discharge/monitor patient progress toward functional and medical goals  Care Tool:  Bathing    Body parts bathed by patient: Right arm, Face   Body parts bathed by helper: Buttocks, Left arm, Front perineal area, Right upper leg, Left upper leg, Abdomen, Chest, Left lower leg, Right lower leg     Bathing assist Assist Level: Total Assistance - Patient < 25%     Upper Body Dressing/Undressing Upper body dressing   What is the patient wearing?: Pull over shirt, Button up shirt    Upper  body assist Assist Level: Maximal Assistance - Patient 25 - 49%    Lower Body Dressing/Undressing Lower body dressing      What is the patient wearing?: Pants, Underwear/pull up     Lower body assist Assist for lower body dressing: Total Assistance - Patient < 25%     Toileting Toileting Toileting Activity did not occur (Clothing management and hygiene only): N/A (no void or bm)  Toileting assist Assist for toileting: Total Assistance - Patient < 25% (simulated)     Transfers Chair/bed transfer  Transfers assist     Chair/bed transfer assist level: Maximal Assistance - Patient 25 - 49%     Locomotion Ambulation   Ambulation assist   Ambulation activity did  not occur: Safety/medical concerns          Walk 10 feet activity   Assist  Walk 10 feet activity did not occur: Safety/medical concerns        Walk 50 feet activity   Assist Walk 50 feet with 2 turns activity did not occur: Safety/medical concerns         Walk 150 feet activity   Assist Walk 150 feet activity did not occur: Safety/medical concerns         Walk 10 feet on uneven surface  activity   Assist Walk 10 feet on uneven surfaces activity did not occur: Safety/medical concerns         Wheelchair     Assist Is the patient using a wheelchair?: Yes Type of Wheelchair: Manual    Wheelchair assist level: Dependent - Patient 0% Max wheelchair distance: 244ft    Wheelchair 50 feet with 2 turns activity    Assist        Assist Level: Dependent - Patient 0%   Wheelchair 150 feet activity     Assist      Assist Level: Dependent - Patient 0%   Blood pressure (!) 138/46, pulse 71, temperature 97.8 F (36.6 C), temperature source Oral, resp. rate 15, height 5\' 5"  (1.651 m), weight 58.5 kg, SpO2 94 %.  Medical Problem List and Plan: 1. 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  Antithrombotics: -DVT/anticoagulation:  Pharmaceutical: Eliquis according to pharmacy given age and GFR, will use 2.5mg  BID              -antiplatelet therapy: N/A 3. Pain Management: Tylenol as needed, muscle rub cream PRN -Hx of endstage OA of knees gets gel and cortisone injections last performed ~3wks ago per daughter 4. Mood/Behavior/Sleep: Namenda 10 mg twice daily, melatonin 3mg  QHS PRN             -antipsychotic agents: N/A  -03/23/22 a little more irritable today, monitor behavior 5. Neuropsych/cognition: This patient is not capable of making decisions  on her own behalf. Has moderate dementia 6. Skin/Wound Care: Routine skin checks 7. Fluids/Electrolytes/Nutrition: Routine in and outs with follow-up chemistries weekly starting 03/24/22  -Cont Vit D3 400IU daily 8.  Atrial fibrillation with RVR LAA thrombus.  Continue Eliquis, Cardizem 360 mg daily.  Cardiac rate controlled 9.  UTI/Proteus Mirabalis.  Completed  course of Rocephin -03/23/22 family concerned with urine still being amber; encourage PO fluids, monitor  for cloudiness or UTI symptoms/behavior changes 10.  Hyperlipidemia.  Lipitor 80mg  QD 11.  Essential hypertension.  Continue Cardizem as directed  -3/16-17/24 BPs stable, monitor Vitals:   03/20/22 1749 03/20/22 2010 03/21/22 0538 03/21/22 1414  BP: (!) 134/58 (!) 135/53 (!) 132/53 (!) 120/45   03/21/22 1951 03/22/22 0636 03/22/22 1312 03/22/22 1928  BP: (!) 136/59 (!) 158/58 (!) 117/57 (!) 134/46   03/23/22 0553  BP: (!) 138/46    12.  Diabetes mellitus.  Diet controlled at baseline.  Hemoglobin A1c 5.6. 13.  Chronic bronchitis.  Continue Dulera and Incruse inhalers as indicated 14. Urinary incontinence: daughter wants purewick- I think due to her issues, that's reasonable and timed voiding q4 hours during day- since cannot get her OOB easily at night due to her stroke- hopefully will improve. -03/23/22 family with questions, discussed that purewick is likely temporary unless planning to get one at home; defer to weekday team regarding purewick duration 15. Constipation with incontinence- suggest Sorbitol once seen therapy, so can get her on [facility] -03/22/22 no BM in a few days per family, documented LBM 3/14, takes Colace and Senokot at home; ordered Colace 100mg  QD, Senokot 1tab QD, and Sorbitol 20ml PRN, monitor -03/23/22 BM yesterday, still feels like there's more to evacuate but continue regimen for now; advised family about PRN order as well.     LOS: 3 days A FACE TO [place] 03/23/2022, 11:56 AM

## 2022-03-24 LAB — BASIC METABOLIC PANEL
Anion gap: 6 (ref 5–15)
BUN: 14 mg/dL (ref 8–23)
CO2: 24 mmol/L (ref 22–32)
Calcium: 8.2 mg/dL — ABNORMAL LOW (ref 8.9–10.3)
Chloride: 106 mmol/L (ref 98–111)
Creatinine, Ser: 0.63 mg/dL (ref 0.44–1.00)
GFR, Estimated: >60 mL/min (ref >60)
Glucose, Bld: 97 mg/dL (ref 70–99)
Potassium: 3.8 mmol/L (ref 3.5–5.1)
Sodium: 136 mmol/L (ref 135–145)

## 2022-03-24 LAB — CBC
HCT: 37.4 % (ref 36.0–46.0)
Hemoglobin: 12.7 g/dL (ref 12.0–15.0)
MCH: 32.8 pg (ref 26.0–34.0)
MCHC: 34 g/dL (ref 30.0–36.0)
MCV: 96.6 fL (ref 80.0–100.0)
Platelets: 201 10*3/uL (ref 150–400)
RBC: 3.87 MIL/uL (ref 3.87–5.11)
RDW: 13.9 % (ref 11.5–15.5)
WBC: 4.3 10*3/uL (ref 4.0–10.5)
nRBC: 0 % (ref 0.0–0.2)

## 2022-03-24 NOTE — Progress Notes (Signed)
Occupational Therapy Session Note  Patient Details  Name: Bridget [name] MRN: [id number] Date of Birth: [birth year]  Today's Date: 03/24/2022 OT Individual Time: [id number] Individual Time Calculation (min): 40 min    Short Term Goals: Week 1:  OT Short Term Goal 1 (Week 1): Pt will feed self with min A ~50% of meal using adaptive strategies and AD PRN OT Short Term Goal 2 (Week 1): Pt will complete squat pivot transfers with mod A consistently OT Short Term Goal 3 (Week 1): Pt will complete LB dressing mod A  Skilled Therapeutic Interventions/Progress Updates:  Pt greeted seated in w/c with daughter present, pt initially skeptical of going to therapy session but with gentle encouragement and redirection pt agreeable. Total A transport to gym for time mgmt.   Pt completed below NMR tasks to facilitate improved RUE AROM:  Rolling weighed ball with RUE to R<>L to facilitate improved AROM wrist supination/pronation and rolling ball forward/backwards to facilitate improved scapular protraction/retraction. Pt also able to roll ball in circles to R and L side with an emphasis on shoulder circumduction Pt able to pass Weighted ball from R knee> L knee with an emphasis on elbow flexion/extension and wrist supination/pronation x15 reps   Pt able to grasp bean bags when placed in hand with wrist supinated and reach to basket to release bean bags.  Graded task up with pt able to grasp slender cones with RUE and stack cones one on top of the other with an emphasis on bimanual control and functional cylindrical grasp.   Pt also able to fold wash cloths with an emphasis on bimanual tasks and RUE coordination. Pt using RUE mostly as gross assist but with cues able to incorporate RUE appropriately.   Pt transported back to room with total A for time mgmt. Ended session with pt seated in w/c with daughter present and all needs within reach.  Therapy Documentation Precautions:  Precautions Precautions:  Fall Precaution Comments: watch HR, HOH, R-hemi, Restrictions Weight Bearing Restrictions: No  Pain: Unrated pain in R hand, rest breaks, repositioning provided as needed.     Therapy/Group: Individual Therapy  Corinne Ports [facility] 03/24/2022, 3:44 PM

## 2022-03-24 NOTE — Progress Notes (Signed)
Orthopedic Tech Progress Note Patient Details:  Bridget [name] [birth year] [id number] Velcro wrist brace was applied by PT upon delivery  Ortho Devices Type of Ortho Device: Velcro wrist splint Ortho Device/Splint Location: Right wrist Ortho Device/Splint Interventions: Application   Post Interventions Patient Tolerated: Well  Bridget [name] 03/24/2022, 9:36 AM

## 2022-03-24 NOTE — Progress Notes (Signed)
PROGRESS NOTE   Subjective/Complaints:  No pain c/os but OT notes wrist pain with ROM Labs reviewed   ROS- limited by cognition  Objective:   No results found. Recent Labs    03/24/22 0710  WBC 4.3  HGB 12.7  HCT 37.4  PLT 201    Recent Labs    03/24/22 0710  NA 136  K 3.8  CL 106  CO2 24  GLUCOSE 97  BUN 14  CREATININE 0.63  CALCIUM 8.2*     Intake/Output Summary (Last 24 hours) at 03/24/2022 0901 Last data filed at 03/24/2022 0841 Gross per 24 hour  Intake 716 ml  Output 1800 ml  Net -1084 ml         Physical Exam: Vital Signs Blood pressure (!) 129/56, pulse 61, temperature 98 F (36.7 C), resp. rate 16, height 5\' 5"  (1.651 m), weight 58.5 kg, SpO2 95 %.   General: No acute distress, sitting in w/c, a little more irritable this morning Heart: Regular rate and rhythm no rubs murmurs or extra sounds Lungs: Clear to auscultation, breathing unlabored, no rales or wheezes Abdomen: Positive bowel sounds, soft, very mildly tender to palpation throughout but no significant or focal tenderness, nondistended, slightly firm area in the R lateral abdomen suspicious for large stool burden Extremities: No clubbing, cyanosis, or edema Skin: No evidence of breakdown, no evidence of rash  PRIOR EXAM: Neurologic: Cranial nerves II through XII intact, motor strength is 5/5 in left  deltoid, bicep, tricep, grip, hip flexor, knee extensors, ankle dorsiflexor and plantar flexor 3- Right delt, biceps, 2- wrist and finger flexors and extensor 3- Right HF, KE, ADF  Musculoskeletal: Full range of motion in all 4 extremities. Right wrist drop no pain with ROM , no wrist effusion mild pain with sup/pronation, mild pain to palp over R ulnar styloid    Assessment/Plan: 1. Functional deficits which require 3+ hours per day of interdisciplinary therapy in a comprehensive inpatient rehab setting. Physiatrist is providing  close team supervision and 24 hour management of active medical problems listed below. Physiatrist and rehab team continue to assess barriers to discharge/monitor patient progress toward functional and medical goals  Care Tool:  Bathing    Body parts bathed by patient: Right arm, Face   Body parts bathed by helper: Buttocks, Left arm, Front perineal area, Right upper leg, Left upper leg, Abdomen, Chest, Left lower leg, Right lower leg     Bathing assist Assist Level: Total Assistance - Patient < 25%     Upper Body Dressing/Undressing Upper body dressing   What is the patient wearing?: Pull over shirt, Button up shirt    Upper body assist Assist Level: Maximal Assistance - Patient 25 - 49%    Lower Body Dressing/Undressing Lower body dressing      What is the patient wearing?: Pants, Underwear/pull up     Lower body assist Assist for lower body dressing: Total Assistance - Patient < 25%     Toileting Toileting Toileting Activity did not occur (Clothing management and hygiene only): N/A (no void or bm)  Toileting assist Assist for toileting: Total Assistance - Patient < 25% (simulated)  Transfers Chair/bed transfer  Transfers assist     Chair/bed transfer assist level: Maximal Assistance - Patient 25 - 49%     Locomotion Ambulation   Ambulation assist   Ambulation activity did not occur: Safety/medical concerns          Walk 10 feet activity   Assist  Walk 10 feet activity did not occur: Safety/medical concerns        Walk 50 feet activity   Assist Walk 50 feet with 2 turns activity did not occur: Safety/medical concerns         Walk 150 feet activity   Assist Walk 150 feet activity did not occur: Safety/medical concerns         Walk 10 feet on uneven surface  activity   Assist Walk 10 feet on uneven surfaces activity did not occur: Safety/medical concerns         Wheelchair     Assist Is the patient using a  wheelchair?: Yes Type of Wheelchair: Manual    Wheelchair assist level: Dependent - Patient 0% Max wheelchair distance: 253ft    Wheelchair 50 feet with 2 turns activity    Assist        Assist Level: Dependent - Patient 0%   Wheelchair 150 feet activity     Assist      Assist Level: Dependent - Patient 0%   Blood pressure (!) 129/56, pulse 61, temperature 98 F (36.7 C), resp. rate 16, height 5\' 5"  (1.651 m), weight 58.5 kg, SpO2 95 %.  Medical Problem List and Plan: 1. 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  Antithrombotics: -DVT/anticoagulation:  Pharmaceutical: Eliquis according to pharmacy given age and GFR, will use 2.5mg  BID              -antiplatelet therapy: N/A 3. Pain Management: Tylenol as needed, muscle rub cream PRN -Hx of endstage OA of knees gets gel and cortisone injections last performed ~3wks ago per daughter 4. Mood/Behavior/Sleep: Namenda 10 mg twice daily, melatonin 3mg  QHS PRN             -antipsychotic agents: N/A  -03/23/22 a little more irritable today, monitor behavior 5. Neuropsych/cognition: This patient is not capable of making decisions on her own behalf. Has moderate dementia 6. Skin/Wound Care: Routine skin checks 7. Fluids/Electrolytes/Nutrition: Routine in and outs with follow-up chemistries weekly starting 03/24/22  -Cont Vit D3 400IU daily 8.  Atrial fibrillation with RVR LAA thrombus.  Continue Eliquis, Cardizem 360 mg daily.  Cardiac rate controlled 9.  UTI/Proteus Mirabalis.  Completed  course of Rocephin -03/23/22 family concerned with urine still being amber; encourage PO fluids, monitor for cloudiness or UTI symptoms/behavior  changes 10.  Hyperlipidemia.  Lipitor 80mg  QD 11.  Essential hypertension.  Continue Cardizem as directed  3/18 controlled  Vitals:   03/20/22 1749 03/20/22 2010 03/21/22 0538 03/21/22 1414  BP: (!) 134/58 (!) 135/53 (!) 132/53 (!) 120/45   03/21/22 1951 03/22/22 0636 03/22/22 1312 03/22/22 1928  BP: (!) 136/59 (!) 158/58 (!) 117/57 (!) 134/46   03/23/22 0553 03/23/22 1304  03/23/22 2002 03/24/22 0554  BP: (!) 138/46 (!) 122/58 (!) 160/61 (!) 129/56    12.  Diabetes mellitus.  Diet controlled at baseline.  Hemoglobin A1c 5.6. 13.  Chronic bronchitis.  Continue Dulera and Incruse inhalers as indicated 14. Urinary incontinence: daughter wants purewick- I think due to her issues, that's reasonable and timed voiding q4 hours during day- since cannot get her OOB easily at night due to her stroke- hopefully will improve. -03/23/22 family with questions, discussed that purewick is likely temporary unless planning to get one at home; defer to weekday team regarding purewick duration 15. Constipation with incontinence- suggest Sorbitol once seen therapy, so can get her on [facility] -03/22/22 no BM in a few days per family, documented LBM 3/14, takes Colace and Senokot at home; ordered Colace 100mg  QD, Senokot 1tab QD, and Sorbitol 83ml PRN, monitor -03/23/22 BM yesterday, still feels like there's more to evacuate but continue regimen for now; advised family about PRN order as well.     LOS: 4 days A FACE TO FACE EVALUATION WAS PERFORMED  Charlett Blake 03/24/2022, 9:01 AM

## 2022-03-24 NOTE — Progress Notes (Signed)
Physical Therapy Session Note  Patient Details  Name: Bridget [name] MRN: [id number] Date of Birth: [birth year]  Today's Date: 03/24/2022 PT Individual Time: 1400-1500 PT Individual Time Calculation (min): 60 min   Short Term Goals: Week 1:  PT Short Term Goal 1 (Week 1): Pt will perform transfers w/ Mod A using LRAD PT Short Term Goal 2 (Week 1): Pt will tolerate standing for >2 minutes PT Short Term Goal 3 (Week 1): Pt will intiate gait of at [place] 5 feet  Skilled Therapeutic Interventions/Progress Updates:    Pt received sitting in [facility] w/ daughter present. Pt agreeable to PT services. Pt transported to hallway dependently in [place]. Using LHR in the hallway Pt performed 1STS w/ Mod A +2 to stand. Pt unable to extended B knees w/o causing pain. Max vc required for pt to keep head up to avoid forward trunk flexion. Pt continued to express that she could not stand up. Pt tolerated standing for ~2 minutes before requesting to sit down. Pt required Mod A to control descent. In order to avoid increasing knee joint pain, pt performed seated open chain LE exercises to work on [employer] which included:  Isometric hip flexion x5 B Seated hip extension using yellow t-band 1x10 B. Pt was adamant that she could not perform exercise on LLE even though she has increased R sided weakness. Required physical and verbal cues to initiate LLE movement.   Pt transported back to room to use the restroom. Mod A x2 using Stedy to transfer from [facility] to toilet and back to [facility]. Pt incontinent in brief. Pt voided again in toilet. Pt total assist for pericare and for LE dressing. Pt required increased time to transfer from toilet back to [facility] using stedy, w/ max vc from therapist to participate. Pt left sitting in [facility] w/ all needs met and in the care of her daughter.   Therapy Documentation Precautions:  Precautions Precautions: Fall Precaution Comments: watch HR, HOH, R-hemi, Restrictions Weight Bearing Restrictions:  No General:  Pain: pt denies any pain      Therapy/Group: Individual Therapy  Jivan Symanski 03/24/2022, 5:20 PM

## 2022-03-24 NOTE — Progress Notes (Signed)
2 assist transfer using stedy. Leans heavy to right. Using purewick at [facility] with timed toileting every 4 hours during day. Bilateral heels red but blanchable. Elevated off bed with pillows. Educated great granddaughter about assessing skin, keeping heels elevated off bed. Patient very [place], hearing aid placed on charger by family. Denies pain. Patrici Ranks A

## 2022-03-24 NOTE — Progress Notes (Addendum)
Occupational Therapy Session Note  Patient Details  Name: Bridget [name] MRN: [id number] Date of Birth: 12/21/1921  Today's Date: 03/24/2022 OT Individual Time: [id number] and [phone]  OT Individual Time Calculation (min): 70 min and 50 min    Short Term Goals: Week 1:  OT Short Term Goal 1 (Week 1): Pt will feed self with min A ~50% of meal using adaptive strategies and AD PRN OT Short Term Goal 2 (Week 1): Pt will complete squat pivot transfers with mod A consistently OT Short Term Goal 3 (Week 1): Pt will complete LB dressing mod A  Skilled Therapeutic Interventions/Progress Updates:    Visit 1:  pt c/o R wrist pain, informed MD, wrist cock up splint ordered and delivered  Pt received in bed with daughter present.  Spent time with pt to build rapport, pt cooperative and participated well this session.  Had pt sit to EOB with max A to move legs toward EOB and then max to Sit up.   Once sitting, she held balance well, even demonstrating the ability to reach toward the floor with CGA.  Max A scoot pivot to Wc to her R needing 4 scoots to complete bed to chair transfer. Once in wc pt worked on oral care with min A, UB bathing with A with hand over hand guiding of R hand to wash L arm. Pt able to wash thighs and lower legs, but not feet.  Tried to have pt stand at sink but unable to get her to put weight on her legs and to have her push through them to extend them.  For dressing, pt donned shirt and sweater with max A and pants over feet with max A.  Tried to have pt stand with stedy 2x with max A but pt unable to lift hips to rise to stand. then needed to get A from a NT, with total A of 2 pt lifted hips enough to have her daughter pull her pants up. Pt did not extend her legs c/o knee pain.  "You can hear my knees crunch".   Pt positioned in w/c with lap tray. Ortho tech brought wrist splint and applied to pt.  It fit well and pt liked how it supported her wrist.  Daughter in room with patient.      Visit 2: no c/o pain this session.  Pt received in wc with dtr present. Pt agreeable to going to therapy. Pt taken to gym and due to prior knee pain this am,  had pt try a sliding board to transfer wc to mat to her R with mod A as 2nd therapist helped to keep board stabilized.  Pt did not c/o leg pain as she did not need to put as much pressure on her legs. Pt did well sitting on mat for over 35 minutes with upright posture and midline control as she engaged in several RUE NMR activities including UE ranger (pt able to range arm out and make arm circles without A), cone pick up with min A hand over hand guidance, folding wash clothes with hand over hand guidance with R hand, scrunching towel with R hand and pushing R hand forward and back on towel on table.  Provided pt with yellow foam block to work on thumb and finger squeezes.  Also provided wash mit to use for next self care session.  Pt participated well with exercises and stated it felt good to move her arm.   Pt used slide board back  to wc to L with mod A and 2nd person to stabilize the board.  Pt returned to her room with her dtr with all needs met.    FAST-UL for RUE Assessed this session: FAST-UL Outcome Measure (Performed sitting edge of mat) Hand-to-mouth (HtM) Movement Starting Position: Participant seated on a standard chair without armrests. Trunk leaning on back support of chair. Both hands placed in pronated position on the ipsilateral middle thigh. Feet placed flat on the floor. If participants have any difficulty in understanding instructions (i.e. aphasia) a visual demonstration is suggested. For each of the 5 tasks of the FAST-UL, the subject at first performs the movement with the less affected UL and then with the affected one. The movement can be repeated 3 times and the best score of the three attempts is assigned.   Instructions: Each subject is asked to move the hand towards the mouth, touch it with fingertips and  return to the thigh. Motor task occurs without moving the trunk off the back support and without moving the head toward the hand.   Scoring: Clinical score from 0 to 3 is provided by comparing affected side with less affected one as follows: 0 = no movement at all. 1 = The movement task is not completed (less of 50% of the contralateral HtM movement). 2 = The movement task is not completed (more of 50% of the contralateral HtM movement but the mouth is not reached) or the movement task is completed with compensations. If the mouth is touched with the wrist or the palm or the movement is performed with head or trunk compensations (flexion of the head and trunk towards the hand) the score is 2.   3 = movement carried out at 100% of the contralateral HtM movement. HtM occurs with adequate shoulder flexion and abduction, elbow flexion, and forearm supination. The mouth is touched with fingertips.  Patient Score: 3   Reach to Target (RtT) Movement Starting Position: Same starting conditions of HtM movement. Instructions: Each subject is asked to move the hand toward a target (i.e. the hand of the examiner) located in front of the subject in the ipsilateral workspace at shoulder height, at a distance corresponding to 100% of the fully extended UL within arm's reach (less affected arm as reference). Participants have to reach, touch the target, and return. Motor task occurs without moving the trunk off the back support. Scoring: Clinical score from 0 to 3 is provided by comparing affected side with less affected one as follows: 0 = no movement at all. 1 = The movement task is not completed (less of 50% of the contralateral RtT movement).  2 = The movement task is not completed (more of 50% of the contralateral RtT movement but the target is not reached) or the movement task is completed with compensations (i.e. the trunk loses contact with the back support of the chair with forward displacement, shoulder  flexion occurs with excessive scapular elevation, or shoulder excessive abduction). If the target is reached with trunk or shoulder compensations for inadequate elbow and finger extension the score is 2.  3 = movement performed at 100% of the contralateral RtT. The target is reached with adequate shoulder flexion, elbow, wrist and finger extension.  Patient Score: 2   Prono-supination (PS) Movement Starting Position: Same starting conditions of HtM movement. Instructions: Motor task occurs without moving the trunk anteriorly or laterally, the medial side of the humerus is against the body, the forearm is fully pronated with the  hand resting on the thigh. Scoring: Clinical score from 0 to 3 is provided by comparing paretic side with less affected one as follows: 0 = no movement at all. 1 = The movement task is not completed (less of 50% of the contralateral PS movement).  2 = The movement task is not completed (more of 50% of the contralateral PS movement but the forearm is not fully supinated) or the movement task is completed with compensations (i.e. excessive trunk inclination, shoulder abduction). If the movement is completed with compensations at elbow, shoulder or trunk level the score is 2. 3 = movement performed at 100% of the contralateral PS (complete supination of the forearm with the dorsal part of the hand in contact with the thigh).   Patient Score: 2   Grasp and Release (GaR) Movement Starting position: Participant seated on a standard chair. Hip and knees in 90 flexion, feet flat on the floor. Upper limb (UL) resting on a table in front of the participant with approximately 90 elbow flexion, forearm pronated and fingers in a relaxed extended and adducted position.  Instructions: The subject performs a grasping movement of a cylindrical rigid glass (at least 6 cm diameter) placed proximally to an imaginary line connecting the distal joints of thumb and index finger. The subject  is asked to grasp the glass, lift it at least 2 cm (elbow remains in contact with the table), and release it. Scoring:  Clinical score from 0 to 3 is provided by comparing affected side with less affected one as follows: 0 = No movement. The grasp is not possible. 1 = The movement task is not completed (less of 50% of the task). Some prehension is possible but the grasp is not sufficiently stable to lift the object; the grasp can be performed with the use of the less affected hand only to stabilize the glass for inadequate hand/finger opening and the release is not possible. Some hand opening is required otherwise the score is 0. 2 = The movement task is not completed (more of 50% of the task). The object is grasped and lifted but it falls or the task is completed using alternative grasping strategies (i.e. multi-pulpar, palmar, digito palmar; grasping and releasing of the object is possible with abnormal orientation of the wrist and fingers toward the object and the forearm is lifted off the table). 3 = The task is completed using the expected pattern (normal orientation of fingers or wrist toward the object, the grasp occurs with thumb and fingers in opposition, forearm supination, elbow flexion; thumb abduction and finger extension to release the object).  Patient Score: 1   Pinch and Release (PaR) Movement Starting position: Same starting conditions of GaR movement The participant performs a PaR movement of a pen placed on a table in the midline of an imaginary line connecting the distal joints of thumb and index finger. The participants asked to pinch the pen with the tips of thumb and index finger, lift it at least 2 cm (elbow remains in contact with the table), and release it. Clinical score from 0 to 3 is provided by comparing affected side with non-affected one as follows: 0 = No movement. The pinch is not possible. 1 = The movement task is not completed (less of 50% of the task). Some  prehension is possible but the pinch is not sufficiently stable to lift the object; the pinch occurs with the use of the less affected hand to stabilize the object for inadequate finger opening  and the release is not possible. Some fingers movement is required otherwise the score is 0. 2 = The movement task is not completed (more of 50% of the task). The object is pinched and lifted but it falls or the task is completed using alternative pinching strategies (e.g. pinching with all the fingers, tripod pinch, pinching and releasing of the object is possible with abnormal orientation of fingers and wrist toward the object and the forearm is lifted off the table). 3 = The task is completed using the expected pattern (normal orientation of fingers or wrist toward the object, the pinch occurs with opposition of pads of index finger and thumb, and wrist extension).  Patient Score: 1  Total score: 9   Therapy Documentation Precautions:  Precautions Precautions: Fall Precaution Comments: watch HR, HOH, R-hemi, Restrictions Weight Bearing Restrictions: No    Vital Signs: Therapy Vitals Pulse Rate: 61 Resp: 16 Oxygen Therapy SpO2: 95 % O2 Device: Room Air    Therapy/Group: Individual Therapy  [place] 03/24/2022, 10:40 AM

## 2022-03-25 NOTE — Plan of Care (Signed)
  Problem: Consults Goal: RH STROKE PATIENT EDUCATION Description: See Patient Education module for education specifics  Outcome: Progressing   Problem: RH SAFETY Goal: RH STG ADHERE TO SAFETY PRECAUTIONS W/ASSISTANCE/DEVICE Description: STG Adhere to Safety Precautions With  cues Assistance/Device. Outcome: Progressing   

## 2022-03-25 NOTE — Progress Notes (Signed)
PROGRESS NOTE   Subjective/Complaints:  Pt alert eating banana using RUE to eat  ROS- limited by cognition  Objective:   No results found. Recent Labs    03/24/22 0710  WBC 4.3  HGB 12.7  HCT 37.4  PLT 201    Recent Labs    03/24/22 0710  NA 136  K 3.8  CL 106  CO2 24  GLUCOSE 97  BUN 14  CREATININE 0.63  CALCIUM 8.2*     Intake/Output Summary (Last 24 hours) at 03/25/2022 0804 Last data filed at 03/25/2022 0437 Gross per 24 hour  Intake 714 ml  Output 1365 ml  Net -651 ml         Physical Exam: Vital Signs Blood pressure (!) 156/75, pulse 74, temperature 98 F (36.7 C), resp. rate 16, height 5\' 5"  (1.651 m), weight 58.5 kg, SpO2 95 %.   General: No acute distress, sitting in w/c, a little more irritable this morning Heart: Regular rate and rhythm no rubs murmurs or extra sounds Lungs: Clear to auscultation, breathing unlabored, no rales or wheezes Abdomen: Positive bowel sounds, soft, very mildly tender to palpation throughout but no significant or focal tenderness, nondistended, slightly firm area in the R lateral abdomen suspicious for large stool burden Extremities: No clubbing, cyanosis, or edema Skin: No evidence of breakdown, no evidence of rash  PRIOR EXAM: Neurologic: Cranial nerves II through XII intact, motor strength is 5/5 in left  deltoid, bicep, tricep, grip, hip flexor, knee extensors, ankle dorsiflexor and plantar flexor 3- Right delt, biceps, 3- wrist and finger flexors and extensor 3- Right HF, KE, ADF  Musculoskeletal: Full range of motion in all 4 extremities. Right wrist drop no pain with ROM , no wrist effusion mild pain with sup/pronation, mild pain to palp over R ulnar styloid    Assessment/Plan: 1. Functional deficits which require 3+ hours per day of interdisciplinary therapy in a comprehensive inpatient rehab setting. Physiatrist is providing close team supervision  and 24 hour management of active medical problems listed below. Physiatrist and rehab team continue to assess barriers to discharge/monitor patient progress toward functional and medical goals  Care Tool:  Bathing    Body parts bathed by patient: Right arm, Face   Body parts bathed by helper: Buttocks, Left arm, Front perineal area, Right upper leg, Left upper leg, Abdomen, Chest, Left lower leg, Right lower leg     Bathing assist Assist Level: Total Assistance - Patient < 25%     Upper Body Dressing/Undressing Upper body dressing   What is the patient wearing?: Pull over shirt, Button up shirt    Upper body assist Assist Level: Maximal Assistance - Patient 25 - 49%    Lower Body Dressing/Undressing Lower body dressing      What is the patient wearing?: Pants, Underwear/pull up     Lower body assist Assist for lower body dressing: Total Assistance - Patient < 25%     Toileting Toileting Toileting Activity did not occur (Clothing management and hygiene only): N/A (no void or bm)  Toileting assist Assist for toileting: Total Assistance - Patient < 25% (simulated)     Transfers Chair/bed transfer  Transfers  assist     Chair/bed transfer assist level: Maximal Assistance - Patient 25 - 49%     Locomotion Ambulation   Ambulation assist   Ambulation activity did not occur: Safety/medical concerns          Walk 10 feet activity   Assist  Walk 10 feet activity did not occur: Safety/medical concerns        Walk 50 feet activity   Assist Walk 50 feet with 2 turns activity did not occur: Safety/medical concerns         Walk 150 feet activity   Assist Walk 150 feet activity did not occur: Safety/medical concerns         Walk 10 feet on uneven surface  activity   Assist Walk 10 feet on uneven surfaces activity did not occur: Safety/medical concerns         Wheelchair     Assist Is the patient using a wheelchair?: Yes Type of  Wheelchair: Manual    Wheelchair assist level: Dependent - Patient 0% Max wheelchair distance: 22ft    Wheelchair 50 feet with 2 turns activity    Assist        Assist Level: Dependent - Patient 0%   Wheelchair 150 feet activity     Assist      Assist Level: Dependent - Patient 0%   Blood pressure (!) 156/75, pulse 74, temperature 98 F (36.7 C), resp. rate 16, height 5\' 5"  (1.651 m), weight 58.5 kg, SpO2 95 %.  Medical Problem List and Plan: 1. 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  Antithrombotics: -DVT/anticoagulation:  Pharmaceutical: Eliquis according to pharmacy given age and GFR, will use 2.5mg  BID              -antiplatelet therapy: N/A 3. Pain Management: Tylenol as needed, muscle rub cream PRN -Hx of endstage OA of knees gets gel and cortisone injections last performed ~3wks ago per daughter 4. Mood/Behavior/Sleep: Namenda 10 mg twice daily, melatonin 3mg  QHS PRN             -antipsychotic agents: N/A  Slept well, pleasant today  5. Neuropsych/cognition: This patient is not capable of making decisions on her own behalf. Has moderate dementia 6. Skin/Wound Care: Routine skin checks 7. Fluids/Electrolytes/Nutrition: Routine in and outs with follow-up chemistries weekly starting 03/24/22  -Cont Vit D3 400IU daily 8.  Atrial fibrillation with RVR LAA thrombus.  Continue Eliquis, Cardizem 360 mg daily.  Cardiac rate controlled 3/19 9.  UTI/Proteus Mirabilis.  Completed  course of Rocephin -03/23/22 family concerned with urine still being amber; encourage PO fluids, monitor for cloudiness or UTI  symptoms/behavior changes 10.  Hyperlipidemia.  Lipitor 80mg  QD 11.  Essential hypertension.  Continue Cardizem as directed  [id number] mild systolic elevation - check ortho vitals  Vitals:   03/21/22 0538 03/21/22 1414 03/21/22 1951 03/22/22 0636  BP: (!) 132/53 (!) 120/45 (!) 136/59 (!) 158/58   03/22/22 1312 03/22/22 1928 03/23/22 0553 03/23/22 1304  BP: (!) 117/57 Marland Kitchen)  134/46 (!) 138/46 (!) 122/58   03/23/22 2002 03/24/22 0554 03/24/22 1344 03/24/22 2004  BP: (!) 160/61 (!) 129/56 (!) 123/56 (!) 156/75    12.  Diabetes mellitus.  Diet controlled at baseline.  Hemoglobin A1c 5.6. 13.  Chronic bronchitis.  Continue Dulera and Incruse inhalers as indicated 14. Urinary incontinence: daughter wants purewick- I think due to her issues, that's reasonable and timed voiding q4 hours during day- since cannot get her OOB easily at night due to her stroke- hopefully will improve. -03/23/22 family with questions, discussed that purewick is likely temporary unless planning to get one at home; defer to weekday team regarding purewick duration 15. Constipation with incontinence- suggest Sorbitol once seen therapy, so can get her on [facility] -3/19- incont BM last noc takes Colace and Senokot at home; ordered Colace 100mg  QD, Senokot 1tab QD, and Sorbitol 73ml PRN, monitor   LBM 3/19 lg type 4  LOS: 5 days A FACE TO [place] Laniyah Rosenwald 03/25/2022, 8:04 AM

## 2022-03-25 NOTE — Progress Notes (Signed)
Physical Therapy Session Note  Patient Details  Name: Bridget [name] MRN: [id number] Date of Birth: 1921/07/23  Today's Date: 03/25/2022 PT Individual Time: 0905-1000 PT Individual Time Calculation (min): 55 min  PT Individual Time: 1400-1500 PT Individual Time Calculation (min): 60 min Short Term Goals: Week 1:  PT Short Term Goal 1 (Week 1): Pt will perform transfers w/ Mod A using LRAD PT Short Term Goal 2 (Week 1): Pt will tolerate standing for >2 minutes PT Short Term Goal 3 (Week 1): Pt will intiate gait of at [place] 5 feet  Skilled Therapeutic Interventions/Progress Updates:  Session 1   Pt received supine in bed w/ daughter present and agreeable to PT services. Therapist donned pt's ted hose and socks in supine w/ total A. Therapist assisted in LE dressing w/ pt in supine. Pt assisted in supine bridge to pull undergarments up. Log Roll w/ Mod A+2 to sit at EOB. Therapist donned pt's shoes w/ total a. Sit<>stand w/ Mod A +2. Pt unable to come to compete stand due to pt reported B knee pain. Vc to lift head up were dismissed w/ pt expressing that she "can't" and requesting to sit down. Performed slide board transfer from EOB to [facility] w/ Mod A for board placement. Vc for pt to use LUE to assist in lateral shift. Pt transported to therapy gym dependently.   Grade 2 knee joint distraction performed on R knee. Pt exhibited muscle guarding at first, however, w/ gentle oscillations pt was able to relax. Pt did not tolerate grade 3 mobilizations vocalizing pain. Grade 3 L knee joint distraction w/ oscillations. Grade 3 PA mobs done bilaterally and distraction done in order to increase joint space to promote increased extension to improve standing tolerance:  Sit <>stand x1 to retest. Pt required vc to use LUE to push up from [facility]. Therapist noted mild increase in R knee extension, however, noted moderate crepitus and pt continued to report knee pain and requested to sit down. Pt has functional extension PROM  however AROM in WB causes pt pain which results in decreased knee extension more so on the R than L.  Pt transported back to room dependently and left in the care of Nurse tech due to pt reporting the need to have a bowel movement.   Session 2:  Pt received sitting upright in WC and agreeable to PT services. Pt transported to therapy gym dependently.   Manual therapy: Bilateral Grade 3 knee joint distraction w/ oscillations and Bilateral Grade 3 PA mobs. Pt tolerated mobilizations more this afternoon than in the morning session.   Pt performed lateral slide board transfer from [facility] to Nu-step w/ Mod A for set up and lateral wt shift at hips to complete transfer. Pt participated 58mins at level 4 w/ continuous encouragement to participate in task. Therapist note no knee joint crepitus while pt performed task.   Pt transported back to room dependently and performed slide board transfer to EOB w/ the help of nurse tech. Mod Ax2 for step up and hand placement. Daughter present to observe how the transfer is done. Will follow-up at later sessions to incorporate more family education.   Therapy Documentation Precautions:  Precautions Precautions: Fall Precaution Comments: watch HR, HOH, R-hemi, Restrictions Weight Bearing Restrictions: No General:   Vital Signs:  Pain: pt did not verbalize pain        Therapy/Group: Individual Therapy  Bridget [name] 03/25/2022, 11:33 AM

## 2022-03-25 NOTE — Progress Notes (Signed)
Occupational Therapy Session Note  Patient Details  Name: Bridget [name] MRN: [id number] Date of Birth: [birth year]  Today's Date: 03/25/2022 OT Individual Time: 1110-1145 OT Individual Time Calculation (min): 35 min  25 mins missed d/t fatigue    Short Term Goals: Week 1:  OT Short Term Goal 1 (Week 1): Pt will feed self with min A ~50% of meal using adaptive strategies and AD PRN OT Short Term Goal 2 (Week 1): Pt will complete squat pivot transfers with mod A consistently OT Short Term Goal 3 (Week 1): Pt will complete LB dressing mod A  Skilled Therapeutic Interventions/Progress Updates:  Pt greeted seated in w/c with daughter present. Daughter reports that pt was "grumpy" from being tired but, pt agreeable to OT intervention. Total A transport to gym for time mgmt.   In gym, worked on seated RUE therapeutic activities with an emphasis on targeted reach and RUE [facility]. Pt able to gross grasp hockey pucks with RUE with pt needing step by step cues to sequence functional grasp such as reach, slide, grasp, supinate wrist, lift arm, reach for bucket.   Attempted to work on stacking 4 inch blocks with RUE to work on proprioception and control however pt then stopped following directions and placed her head in her hands. Pt unable to arouse, therefore transported back to room. Daughter reports that is her normal to fall asleep sitting up and reports that pt has been really tired today.                Ended session with pt seated in w/c with all needs within reach and daughter present.             Therapy Documentation Precautions:  Precautions Precautions: Fall Precaution Comments: watch HR, HOH, R-hemi, Restrictions Weight Bearing Restrictions: No  Pain: no pain reported     Therapy/Group: Individual Therapy  Corinne Ports [facility] 03/25/2022, 12:07 PM

## 2022-03-25 NOTE — Progress Notes (Signed)
Physical Therapy Session Note  Patient Details  Name: Bridget [name] MRN: [id number] Date of Birth: July 12, 1921  Today's Date: 03/25/2022 PT Individual Time: 1300-1330 PT Individual Time Calculation (min): 30 min   Short Term Goals: Week 1:  PT Short Term Goal 1 (Week 1): Pt will perform transfers w/ Mod A using LRAD PT Short Term Goal 2 (Week 1): Pt will tolerate standing for >2 minutes PT Short Term Goal 3 (Week 1): Pt will intiate gait of at [place] 5 feet  Skilled Therapeutic Interventions/Progress Updates:    Pt seated in w/c on arrival and agreeable to therapy. Pt's daughter present in room. Pt does not c/o pain at rest, but mild pain with some activity. Discussed discomfort to work through vs pain that indicates we need to stop. Pt agrees current pain is tolerable, but provided rest as needed for comfort. Pt used kinetron at 60 cm/sec for posterior chain strength x 5 min. Pt participated in conversation requiring intermittent cues to continue pedaling. Pt then remains in w/c and participates in LAQ and LAQ isometric to build strength and comfort with knee extension. Attempted orienting to pt's deficits, pt was unable to state why she was in the hospital or why her R side is weaker. Pt further confused by attempts at orientation, so discontinued and returned to room. Pt was left with all needs in reach and daughter present.   Therapy Documentation Precautions:  Precautions Precautions: Fall Precaution Comments: watch HR, HOH, R-hemi, Restrictions Weight Bearing Restrictions: No General:       Therapy/Group: Individual Therapy  Mickel Fuchs 03/25/2022, 3:46 PM

## 2022-03-26 MED ORDER — UMECLIDINIUM BROMIDE 62.5 MCG/ACT IN AEPB
1.0000 | INHALATION_SPRAY | Freq: Every day | RESPIRATORY_TRACT | Status: DC
  Administered 2022-03-28 – 2022-04-05 (×9): 1 via RESPIRATORY_TRACT
  Filled 2022-03-26: qty 7

## 2022-03-26 MED ORDER — MOMETASONE FURO-FORMOTEROL FUM 200-5 MCG/ACT IN AERO
2.0000 | INHALATION_SPRAY | Freq: Two times a day (BID) | RESPIRATORY_TRACT | Status: DC
  Administered 2022-03-26 – 2022-04-05 (×19): 2 via RESPIRATORY_TRACT
  Filled 2022-03-26: qty 8.8

## 2022-03-26 NOTE — Progress Notes (Signed)
Physical Therapy Session Note  Patient Details  Name: Bridget [name] MRN: [id number] Date of Birth: 1921/01/19  Today's Date: 03/26/2022 PT Individual Time: 1305-1400 PT Individual Time Calculation (min): 55 min   Short Term Goals: Week 1:  PT Short Term Goal 1 (Week 1): Pt will perform transfers w/ Mod A using LRAD PT Short Term Goal 2 (Week 1): Pt will tolerate standing for >2 minutes PT Short Term Goal 3 (Week 1): Pt will intiate gait of at [place] 5 feet  Skilled Therapeutic Interventions/Progress Updates:    Pt received sitting upright in WC and agreeable to PT services. Pt transported to therapy gym dependently. Pt engaged in [facility] propulsion using B LE for ~173ft w/ 2 turns, Min A to at the start of propulsion. Emphasis on functional independence and LE NMR. Pt required visual and verbal cueing for the task. Pull heels under the WC to propel. L Slide board transfer to [facility] w/ Mod A for board stabilization and power across board. Pt required verbal and tactile cues for R lateral lean in order to place the board.   Pt engaged in seated task w Min A to encourage lateral leans in order to improve slide board transfers. Pt instructed to lean R and reach for a lego block using the LUE and then lean to the L to stack the blocks. Pt performed task 15x. Therapist seated the R to stabilize RUE and increase WB and approximation of the RUE. When leaning to the pt demonstrated decreased trunk control and required Min A from therapist to return to midline.   Pt performed L slide board transfer from EOM to [facility] w/ Min A for board stabilization and power across board. Min cues to lean laterally required for transfer.   Pt transported dependently back to room and left in the care of daughter w/ all needs met.  Therapy Documentation Precautions:  Precautions Precautions: Fall Precaution Comments: watch HR, HOH, R-hemi, Restrictions Weight Bearing Restrictions: No General:  Pt denies any pain        Therapy/Group: Individual Therapy  Loraine Freid 03/26/2022, 4:19 PM

## 2022-03-26 NOTE — Progress Notes (Signed)
Patient ID: Bridget [name], female   DOB: 1921-09-04, 89 y.o.   MRN: [id number]  Team Conference Report to Patient/Family  Team Conference discussion was reviewed with the patient and caregiver, including goals, any changes in plan of care and target discharge date.  Patient and caregiver express understanding and are in agreement.  The patient has a target discharge date of 04/05/22.   Sw met with patient and daughter in room and provided team conference updates. Patient still complaining on neck and knee pain.  Patient self-limiting and requiring max x 2 overall. Therapy team recommending a ramp inside the home, daughter will begin this process. Daughter and grand daughter plan to attend family education and will follow up with SW tomorrow on preference of day. No additional questions or concerns.   [name] 03/26/2022, 1:07 PM

## 2022-03-26 NOTE — Plan of Care (Signed)
  Problem: Consults Goal: RH STROKE PATIENT EDUCATION Description: See Patient Education module for education specifics  Outcome: Progressing   Problem: RH BLADDER ELIMINATION Goal: RH STG MANAGE BLADDER WITH ASSISTANCE Description: STG Manage Bladder With toileting  Assistance Outcome: Progressing   Problem: RH SAFETY Goal: RH STG ADHERE TO SAFETY PRECAUTIONS W/ASSISTANCE/DEVICE Description: STG Adhere to Safety Precautions With cues Assistance/Device. Outcome: Progressing   Problem: RH COGNITION-NURSING Goal: RH STG USES MEMORY AIDS/STRATEGIES W/ASSIST TO PROBLEM SOLVE Description: STG Uses Memory Aids/Strategies With cues Assistance to Problem Solve. Outcome: Progressing   Problem: RH KNOWLEDGE DEFICIT Goal: RH STG INCREASE KNOWLEDGE OF DIABETES Description: Patient and daughter will be able to manage DM, medications and dietary modifications using educational resources independently Outcome: Progressing Goal: RH STG INCREASE KNOWLEDGE OF HYPERTENSION Description: Patient and daughter will be able to manage HTN, medications and dietary modifications using educational resources independently Outcome: Progressing Goal: RH STG INCREASE KNOWLEGDE OF HYPERLIPIDEMIA Description: Patient and daughter will be able to manage HLD, medications and dietary modifications using educational resources independently Outcome: Progressing Goal: RH STG INCREASE KNOWLEDGE OF STROKE PROPHYLAXIS Description: Patient and daughter will be able to manage secondary risks, medications and dietary modifications using educational resources independently Outcome: Progressing

## 2022-03-26 NOTE — Patient Care Conference (Signed)
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 Alysia Penna Social Worker Present: Erlene Quan, BSW Nurse Present: Dorien Chihuahua, RN PT Present: Alden Hipp, PT OT Present: Meriel Pica, OT PPS Coordinator present : Gunnar Fusi, SLP     Current Status/Progress Goal Weekly Team Focus  Bowel/Bladder   incontient of bowel and bladder   tolieting to prevent skin breakdown   Q2H tolieting rounds    Swallow/Nutrition/ Hydration   total A- MAX A for ADLs, MOD A +2 for SB transfers, MAX A +2 in stedy, RUE progressing well, working on incorporating into functional tasks and using as gross assist. FAST 9/15   MIN - MOD goals  transfer training, BADL reeducation, functional mobilty, RUE NMR, family ed, DME training    ADL's                Mobility   Mod A for bed mobility, Mod Ax2 for slide board transfer, Max Ax2 sit to stand transfer, have not initated gait   Min A for gait and transfers  functional transfers, LE strenghtening, pain mangement, standing tolerance, caregiver education    Communication                Safety/Cognition/ Behavioral Observations               Pain   Denies   maintain pain status   QS and PRN pain management    Skin   Intact   Maintain skin integrity  Q2H rounds repositioning and PRN       Discharge Planning:  Discharging back home with daughter. 24/7 supervision. 3 steps to enter. Pt has: grab bars, rolltor, TTB, SPC and Toilet Riser   Team Discussion: Patient with labile blood pressure and moderate dementia, arthritic knees post frontal lobe CVA. Incontinent of bowel and bladder; premorbid status, used pads/briefs PTA.   Patient on target to meet rehab goals: Currently needs max assist + 2 overall. Requires min assist +2 for slide-board transfers, +2 with a stedy for sit - stand due to pain in knees.   *See Care Plan and progress notes for long and short-term goals.   Revisions to Treatment Plan:  N/a   Teaching Needs: Safety, medications, dietary modifications, transfers/slide-board use, toileting, etc.   Current Barriers to Discharge: Decreased caregiver support, Home enviroment access/layout, and Behavior  Possible Resolutions to Barriers: Family education Ramp for entry to home recommended; 3 ste     Medical Summary Current Status: poor endurance , elevated BP but labile, incont of bowel  Barriers to Discharge: Uncontrolled Hypertension   Possible Resolutions to [employer] Focus: may need to resume lasix once oral intake improves   Continued Need for Acute Rehabilitation Level of Care: The patient requires daily medical management by a physician with specialized training in physical medicine and  rehabilitation for the following reasons: Direction of a multidisciplinary physical rehabilitation program to maximize functional independence : Yes Medical management of patient stability for increased activity during participation in an intensive rehabilitation regime.: Yes Analysis of laboratory values and/or radiology reports with any subsequent need for medication adjustment and/or medical intervention. : Yes   I attest that I was present, lead the team conference, and concur with the assessment and plan of the team.   Dorien Chihuahua B 03/26/2022,  2:50 PM

## 2022-03-26 NOTE — Progress Notes (Signed)
Occupational Therapy Session Note  Patient Details  Name: Bridget [name] MRN: [id number] Date of Birth: 29-May-1921  Today's Date: 03/26/2022 OT Individual Time: 1005-1100 OT Individual Time Calculation (min): 55 min    Short Term Goals: Week 1:  OT Short Term Goal 1 (Week 1): Pt will feed self with min A ~50% of meal using adaptive strategies and AD PRN OT Short Term Goal 2 (Week 1): Pt will complete squat pivot transfers with mod A consistently OT Short Term Goal 3 (Week 1): Pt will complete LB dressing mod A  Skilled Therapeutic Interventions/Progress Updates:     Pt received in w/c with no pain   ADL: Pt completes ADL at overall MOD-max Level. Skilled interventions include: UB dressing pull over shirt followed by 2 pull around jackets. Hemi dressing +increased time for processing 1 step cues and encouragement to persist even with struggle. Pt perseverative on RUE not working. Pt completes grooming at sink with significantly increased time for pt to imitate using RUE with HOH A MOD A at sink  Neuromuscular Reeducation Seated AAROM in gravity eliminated positions at table top with towel slides in mod-max ranges. Quiet environment used to improve attentionto RUE.    Pt left at end of session in w/c with exit alarm on, call light in reach and all needs met   Therapy Documentation Precautions:  Precautions Precautions: Fall Precaution Comments: watch HR, HOH, R-hemi, Restrictions Weight Bearing Restrictions: No General:    Therapy/Group: Individual Therapy  Tonny Branch 03/26/2022, 6:45 AM

## 2022-03-26 NOTE — Progress Notes (Signed)
PROGRESS NOTE   Subjective/Complaints:  No issues overnite , was awakened for inhaler last noc   ROS- limited by cognition  Objective:   No results found. Recent Labs    03/24/22 0710  WBC 4.3  HGB 12.7  HCT 37.4  PLT 201    Recent Labs    03/24/22 0710  NA 136  K 3.8  CL 106  CO2 24  GLUCOSE 97  BUN 14  CREATININE 0.63  CALCIUM 8.2*     Intake/Output Summary (Last 24 hours) at 03/26/2022 0932 Last data filed at 03/26/2022 [id number] Gross per 24 hour  Intake 340 ml  Output 1822 ml  Net -1482 ml         Physical Exam: Vital Signs Blood pressure (!) 161/74, pulse 64, temperature 98 F (36.7 C), resp. rate 16, height 5\' 5"  (1.651 m), weight 58.5 kg, SpO2 98 %.   General: No acute distress, sitting in w/c, a little more irritable this morning Heart: Regular rate and rhythm no rubs murmurs or extra sounds Lungs: Clear to auscultation, breathing unlabored, no rales or wheezes Abdomen: Positive bowel sounds, soft, very mildly tender to palpation throughout but no significant or focal tenderness, nondistended, slightly firm area in the R lateral abdomen suspicious for large stool burden Extremities: No clubbing, cyanosis, or edema Skin: No evidence of breakdown, no evidence of rash  PRIOR EXAM: Neurologic: Cranial nerves II through XII intact, motor strength is 5/5 in left  deltoid, bicep, tricep, grip, hip flexor, knee extensors, ankle dorsiflexor and plantar flexor 3- Right delt, biceps, 3- wrist and finger flexors and extensor 3- Right HF, KE, ADF  Musculoskeletal: Full range of motion in all 4 extremities. Right wrist drop no pain with ROM , no wrist effusion mild pain with sup/pronation, mild pain to palp over R ulnar styloid    Assessment/Plan: 1. Functional deficits which require 3+ hours per day of interdisciplinary therapy in a comprehensive inpatient rehab setting. Physiatrist is providing close  team supervision and 24 hour management of active medical problems listed below. Physiatrist and rehab team continue to assess barriers to discharge/monitor patient progress toward functional and medical goals  Care Tool:  Bathing    Body parts bathed by patient: Right arm, Face   Body parts bathed by helper: Buttocks, Left arm, Front perineal area, Right upper leg, Left upper leg, Abdomen, Chest, Left lower leg, Right lower leg     Bathing assist Assist Level: Total Assistance - Patient < 25%     Upper Body Dressing/Undressing Upper body dressing   What is the patient wearing?: Pull over shirt, Button up shirt    Upper body assist Assist Level: Maximal Assistance - Patient 25 - 49%    Lower Body Dressing/Undressing Lower body dressing      What is the patient wearing?: Pants, Underwear/pull up     Lower body assist Assist for lower body dressing: Total Assistance - Patient < 25%     Toileting Toileting Toileting Activity did not occur (Clothing management and hygiene only): N/A (no void or bm)  Toileting assist Assist for toileting: Total Assistance - Patient < 25% (simulated)     Transfers Chair/bed  transfer  Transfers assist     Chair/bed transfer assist level: Maximal Assistance - Patient 25 - 49%     Locomotion Ambulation   Ambulation assist   Ambulation activity did not occur: Safety/medical concerns          Walk 10 feet activity   Assist  Walk 10 feet activity did not occur: Safety/medical concerns        Walk 50 feet activity   Assist Walk 50 feet with 2 turns activity did not occur: Safety/medical concerns         Walk 150 feet activity   Assist Walk 150 feet activity did not occur: Safety/medical concerns         Walk 10 feet on uneven surface  activity   Assist Walk 10 feet on uneven surfaces activity did not occur: Safety/medical concerns         Wheelchair     Assist Is the patient using a wheelchair?:  Yes Type of Wheelchair: Manual    Wheelchair assist level: Dependent - Patient 0% Max wheelchair distance: 225ft    Wheelchair 50 feet with 2 turns activity    Assist        Assist Level: Dependent - Patient 0%   Wheelchair 150 feet activity     Assist      Assist Level: Dependent - Patient 0%   Blood pressure (!) 161/74, pulse 64, temperature 98 F (36.7 C), resp. rate 16, height 5\' 5"  (1.651 m), weight 58.5 kg, SpO2 98 %.  Medical Problem List and Plan: 1. 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 Formulized individual treatment plan based on medical history, underlying problem and comorbidities.   -Continue CIR 2.  Antithrombotics: -DVT/anticoagulation:  Pharmaceutical: Eliquis according to pharmacy given age and GFR, will use 2.5mg  BID              -antiplatelet therapy: N/A 3. Pain Management: Tylenol as needed, muscle rub cream PRN -Hx of endstage OA of knees gets gel and cortisone injections last performed ~3wks ago per daughter 4. Mood/Behavior/Sleep: Namenda 10 mg twice daily, melatonin 3mg  QHS PRN             -antipsychotic agents: N/A  Slept well, pleasant today  5. Neuropsych/cognition: This patient is not capable of making decisions on her own behalf. Has moderate dementia 6. Skin/Wound Care: Routine skin checks 7. Fluids/Electrolytes/Nutrition: Routine in and outs with follow-up chemistries weekly starting 03/24/22  -Cont Vit D3 400IU daily 8.  Atrial fibrillation with RVR LAA thrombus.  Continue  Eliquis, Cardizem 360 mg daily.  Cardiac rate controlled 3/19 9.  UTI/Proteus Mirabilis.  Completed  course of Rocephin -03/23/22 family concerned with urine still being amber; encourage PO fluids, monitor for cloudiness or UTI symptoms/behavior changes 10.  Hyperlipidemia.  Lipitor 80mg  QD 11.  Essential hypertension.  Continue Cardizem as directed  [id number] mild systolic elevation - check ortho vitals  Vitals:   03/22/22  1312 03/22/22 1928 03/23/22 0553 03/23/22 1304  BP: (!) 117/57 (!) 134/46 (!) 138/46 (!) 122/58   03/23/22 2002 03/24/22 0554 03/24/22 1344 03/24/22 2004  BP: (!) 160/61 (!) 129/56 (!) 123/56 (!) 156/75   03/25/22 1356 03/25/22 1956 03/26/22 0550 03/26/22 0905  BP: 126/68 (!) 173/82 (!) 162/55 (!) 161/74    12.  Diabetes mellitus.  Diet controlled at baseline.  Hemoglobin A1c 5.6. 13.  Chronic bronchitis.  Continue Dulera and Incruse inhalers as indicated 14. Urinary incontinence: daughter wants purewick- I think due to her issues, that's reasonable and timed voiding q4 hours during day- since cannot get her OOB easily at night due to her stroke- hopefully will improve. -03/23/22 family with questions, discussed that purewick is likely temporary unless planning to get one at home; defer to weekday team regarding purewick duration 15. Constipation with incontinence- suggest Sorbitol once seen therapy, so can get her on [facility] -3/19- incont BM last noc takes Colace and Senokot at home; ordered Colace 100mg  QD, Senokot 1tab QD, and Sorbitol 3ml PRN, monitor   LBM 3/19 lg type 4  LOS: 6 days A FACE TO FACE EVALUATION WAS PERFORMED  Charlett Blake 03/26/2022, 9:32 AM

## 2022-03-26 NOTE — Progress Notes (Signed)
Occupational Therapy Session Note  Patient Details  Name: Bridget [name] MRN: [id number] Date of Birth: 01-03-1922  Today's Date: 03/26/2022 OT Individual Time: 1135-1210 OT Individual Time Calculation (min): 35 min    Short Term Goals: Week 1:  OT Short Term Goal 1 (Week 1): Pt will feed self with min A ~50% of meal using adaptive strategies and AD PRN OT Short Term Goal 2 (Week 1): Pt will complete squat pivot transfers with mod A consistently OT Short Term Goal 3 (Week 1): Pt will complete LB dressing mod A  Skilled Therapeutic Interventions/Progress Updates:    Pt received in w/c with daughter present.  Spent time this session discussing options for toileting and LB dressing as pt is not able to tolerate standing.  Did try having pt push up to semi squat to see if she could tolerate that position, but only able to use L hand and still needed max A to lift hips.  Tried 3x then had pt try a full stand 1 x with her daughter assisting, max A +2 to lift up and pt almost stood but could not extend her knees.  Daughter stated that at home pt used rollator but heavily relied on her arms.  Discussed how pt could use board to [facility] and using a large flat BSC may be easier (although likely not covered by insurance), pt will need to try lateral leans for clothing management. Will try these transfers tomorrow.  Pt worked on lifting R arm to 90 degrees and practiced squeezing foam block.  Pt in wc with daughter and all needs met.     Therapy Documentation Precautions:  Precautions Precautions: Fall Precaution Comments: watch HR, HOH, R-hemi, Restrictions Weight Bearing Restrictions: No    Vital Signs: Therapy Vitals Temp: 98 F (36.7 C) Pulse Rate: 64 Resp: 16 BP: (Abnormal) 161/74 Patient Position (if appropriate): Lying Oxygen Therapy SpO2: 98 % O2 Device: Room Air Pain:  No pain at rest, c/o pain in R wrist and knees with movement - modified activity and used R wrist  splint ADL: ADL Eating: Minimal assistance, Moderate assistance Where Assessed-Eating: Bed level Grooming: Moderate assistance Where Assessed-Grooming: Edge of bed Upper Body Bathing: Maximal assistance Where Assessed-Upper Body Bathing: Edge of bed Lower Body Bathing: Maximal assistance Where Assessed-Lower Body Bathing: Edge of bed Upper Body Dressing: Maximal assistance Where Assessed-Upper Body Dressing: Edge of bed Lower Body Dressing: Dependent, Maximal assistance Where Assessed-Lower Body Dressing: Edge of bed Toileting: Maximal assistance, Dependent (simulated) Where Assessed-Toileting: Bedside Commode Toilet Transfer: Dependent Toilet Transfer Method:  (+2 stabilizing wc for safety) Science writer: Radiographer, therapeutic: Not assessed Social research officer, government: Dependent (simulated) Social research officer, government Method: Education officer, environmental: Shower seat with back   Therapy/Group: Individual Therapy  [place] 03/26/2022, 9:13 AM

## 2022-03-26 NOTE — Progress Notes (Signed)
Physical Therapy Session Note  Patient Details  Name: Bridget [name] MRN: [id number] Date of Birth: 1921/04/05  Today's Date: 03/26/2022 PT Individual Time: [id number] PT Individual Time Calculation (min): 42 min   Short Term Goals: Week 1:  PT Short Term Goal 1 (Week 1): Pt will perform transfers w/ Mod A using LRAD PT Short Term Goal 2 (Week 1): Pt will tolerate standing for >2 minutes PT Short Term Goal 3 (Week 1): Pt will intiate gait of at [place] 5 feet  Skilled Therapeutic Interventions/Progress Updates:    Patient resting in bed at start of session, family present and RN providing morning meds. Patient agreeable to therapy session. Assisted pt to don bottoms in supine, Total assist to thread pants on bil LE's, pt able to flex bil knees with verbal cuing and mod assist provided to maintain foot position for attempted bridge to pull up bottoms. Pt rolled Rt/Lt with use of bed rail and mod assist, Total assist provided to pull up pants on both hips. Max assist for supine>sit EOB with cues for sequencing hand placement on bed rail to roll Lt and to bring LE's off EOB. Pt required mod-max assist to weight shift Rt/Lt and scoot hips anterior to EOB to place feet on floor. Sit<>stand completed 1x from EOB to fully pull up bottoms. Slide board transfer toward Lt completed with Mod assist and cues to guide head/hip relationship and hand placement for direction of transfer. Once in [facility] Mod assist to center hips. Repeated 2x sit<>stand completed from [facility] with therapist blocking bil feet and Rt knee stabilize. Max assist at hips to initiate power up and cue at hips to facilitate anterior weight shift and trunk extension. Pt obtained upright posture when cued to look up at family. MD arrived at EOS, to discuss pt's progress with pt and family.   Therapy Documentation Precautions:  Precautions Precautions: Fall Precaution Comments: watch HR, HOH, R-hemi, Restrictions Weight Bearing Restrictions: No Pain:  pt c/o generalized pain with activity/mobility. Pt unable to rate pain with cuing. Reports pain in tolerable and intermittent with activity but subsides with rest. Therapeutic rest breaks provided for management.      Therapy/Group: Individual Therapy  Verner Mould, DPT Acute Rehabilitation Services Office [phone]  03/26/22 7:44 AM

## 2022-03-26 NOTE — Plan of Care (Signed)
Patient alert to self only, baseline confusion.  VSS throughout shift.  Denied pain and SOB.  All meds given on time as ordered.  Pt Right arm weaker compared to left arm.  Diminished lungs, IS encouraged.  Purewick in place.  POC maintained, will continue to monitor.  Problem: Consults Goal: RH STROKE PATIENT EDUCATION Description: See Patient Education module for education specifics  Outcome: Progressing   Problem: RH BLADDER ELIMINATION Goal: RH STG MANAGE BLADDER WITH ASSISTANCE Description: STG Manage Bladder With toileting  Assistance Outcome: Progressing   Problem: RH SAFETY Goal: RH STG ADHERE TO SAFETY PRECAUTIONS W/ASSISTANCE/DEVICE Description: STG Adhere to Safety Precautions With cues Assistance/Device. Outcome: Progressing   Problem: RH COGNITION-NURSING Goal: RH STG USES MEMORY AIDS/STRATEGIES W/ASSIST TO PROBLEM SOLVE Description: STG Uses Memory Aids/Strategies With cues Assistance to Problem Solve. Outcome: Progressing   Problem: RH KNOWLEDGE DEFICIT Goal: RH STG INCREASE KNOWLEDGE OF DIABETES Description: Patient and daughter will be able to manage DM, medications and dietary modifications using educational resources independently Outcome: Progressing Goal: RH STG INCREASE KNOWLEDGE OF HYPERTENSION Description: Patient and daughter will be able to manage HTN, medications and dietary modifications using educational resources independently Outcome: Progressing Goal: RH STG INCREASE KNOWLEGDE OF HYPERLIPIDEMIA Description: Patient and daughter will be able to manage HLD, medications and dietary modifications using educational resources independently Outcome: Progressing Goal: RH STG INCREASE KNOWLEDGE OF STROKE PROPHYLAXIS Description: Patient and daughter will be able to manage secondary risks, medications and dietary modifications using educational resources independently Outcome: Progressing

## 2022-03-26 NOTE — Progress Notes (Signed)
Patient presents as a +2 stedy for transfer. Patient is unable to pull up with purposeful left hand and has little to no grip with right hand. Patient was not able to any of the work of pulling up with the stedy. Sanda Linger, RN

## 2022-03-27 NOTE — Plan of Care (Signed)
  Problem: Sit to Stand Goal: LTG:  Patient will perform sit to stand in prep for activites of daily living with assistance level (OT) Description: LTG:  Patient will perform sit to stand in prep for activites of daily living with assistance level (OT) Flowsheets (Taken 03/27/2022 1308) LTG: PT will perform sit to stand in prep for activites of daily living with assistance level: (LTG discontinued as pt is not able to tolerate putting weight on her legs due to severe osteoarthritis.) -- Note: LTG discontinued as pt is not able to tolerate putting weight on her legs due to severe osteoarthritis.    Problem: RH Tub/Shower Transfers Goal: LTG Patient will perform tub/shower transfers w/assist (OT) Description: LTG: Patient will perform tub/shower transfers with assist, with/without cues using equipment (OT) Flowsheets (Taken 03/27/2022 1308) LTG: Pt will perform tub/shower stall transfers with assistance level of: (LTG discontinued due to safety concerns with transfers.  Pt's daughter reports that pt prefers sponge baths.) -- Note: LTG discontinued due to safety concerns with transfers. Pt's daughter reports that pt prefers sponge baths.    Problem: RH Memory Goal: LTG Patient will demonstrate ability for day to day recall/carry over during activities of daily living with assistance level (OT) Description: LTG:  Patient will demonstrate ability for day to day recall/carry over during activities of daily living with assistance level (OT). Flowsheets (Taken 03/27/2022 1308) LTG:  Patient will demonstrate ability for day to day recall/carry over during activities of daily living with assistance level (OT): (LTG discontinued as pt has severe dementia and has no ability to recall.) -- Note: LTG discontinued as pt has severe dementia and has no ability to recall.

## 2022-03-27 NOTE — Plan of Care (Signed)
  Problem: RH Balance Goal: LTG: Patient will maintain dynamic sitting balance (OT) Description: LTG:  Patient will maintain dynamic sitting balance with assistance during activities of daily living (OT) Flowsheets (Taken 03/27/2022 1302) LTG: Pt will maintain dynamic sitting balance during ADLs with: (LTG downgraded due to slow progress and difficulty with learning due to cognitive deficits.) Minimal Assistance - Patient > 75% Note: LTG downgraded due to slow progress and difficulty with learning due to cognitive deficits.    Problem: RH Bathing Goal: LTG Patient will bathe all body parts with assist levels (OT) Description: LTG: Patient will bathe all body parts with assist levels (OT) Flowsheets (Taken 03/27/2022 1302) LTG: Pt will perform bathing with assistance level/cueing: (LTG downgraded due to slow progress and difficulty with learning due to cognitive deficits.) Maximal Assistance - Patient 25 - 49% LTG: Position pt will perform bathing: Edge of bed Note: LTG downgraded due to slow progress and difficulty with learning due to cognitive deficits.    Problem: RH Dressing Goal: LTG Patient will perform upper body dressing (OT) Description: LTG Patient will perform upper body dressing with assist, with/without cues (OT). Flowsheets (Taken 03/27/2022 1302) LTG: Pt will perform upper body dressing with assistance level of: (LTG downgraded due to slow progress and difficulty with learning due to cognitive deficits.) Moderate Assistance - Patient 50 - 74% Note: LTG downgraded due to slow progress and difficulty with learning due to cognitive deficits.  Goal: LTG Patient will perform lower body dressing w/assist (OT) Description: LTG: Patient will perform lower body dressing with assist, with/without cues in positioning using equipment (OT) Flowsheets (Taken 03/27/2022 1302) LTG: Pt will perform lower body dressing with assistance level of: (LTG downgraded due to slow progress and difficulty  with learning due to cognitive deficits.) Total Assistance - Patient < 25% Note: LTG downgraded due to slow progress and difficulty with learning due to cognitive deficits.    Problem: RH Toileting Goal: LTG Patient will perform toileting task (3/3 steps) with assistance level (OT) Description: LTG: Patient will perform toileting task (3/3 steps) with assistance level (OT)  Flowsheets (Taken 03/27/2022 1302) LTG: Pt will perform toileting task (3/3 steps) with assistance level: (LTG downgraded due to slow progress and difficulty with learning due to cognitive deficits.) Total Assistance - Patient < 25% Note: LTG downgraded due to slow progress and difficulty with learning due to cognitive deficits.    Problem: RH Functional Use of Upper Extremity Goal: LTG Patient will use RT/LT upper extremity as a (OT) Description: LTG: Patient will use right/left upper extremity as a stabilizer/gross assist/diminished/nondominant/dominant level with assist, with/without cues during functional activity (OT) Flowsheets (Taken 03/27/2022 1302) LTG: Use of upper extremity in functional activities: (LTG downgraded due to slow progress and difficulty with learning due to cognitive deficits.) RUE as gross assist level LTG: Pt will use upper extremity in functional activity with assistance level of: Moderate Assistance - Patient 50 - 74% Note: LTG downgraded due to slow progress and difficulty with learning due to cognitive deficits.    Problem: RH Toilet Transfers Goal: LTG Patient will perform toilet transfers w/assist (OT) Description: LTG: Patient will perform toilet transfers with assist, with/without cues using equipment (OT) Flowsheets (Taken 03/27/2022 1302) LTG: Pt will perform toilet transfers with assistance level of: (LTG downgraded due to slow progress and difficulty with learning due to cognitive deficits.) Maximal Assistance - Patient 25 - 49% Note: LTG downgraded due to slow progress and difficulty  with learning due to cognitive deficits.

## 2022-03-27 NOTE — Progress Notes (Signed)
PROGRESS NOTE   Subjective/Complaints:  BMs x 4 yesterday , family reports  blood in stool but none noted by nsg  ROS- limited by cognition  Objective:   No results found. No results for input(s): "WBC", "HGB", "HCT", "PLT" in the last 72 hours.  No results for input(s): "NA", "K", "CL", "CO2", "GLUCOSE", "BUN", "CREATININE", "CALCIUM" in the last 72 hours.   Intake/Output Summary (Last 24 hours) at 03/27/2022 0829 Last data filed at 03/26/2022 1840 Gross per 24 hour  Intake 360 ml  Output 100 ml  Net 260 ml         Physical Exam: Vital Signs Blood pressure (!) 153/64, pulse 62, temperature 98.1 F (36.7 C), temperature source Oral, resp. rate 16, height 5\' 5"  (1.651 m), weight 58.5 kg, SpO2 97 %.   General: No acute distress, sitting in w/c, a little more irritable this morning Heart: Regular rate and rhythm no rubs murmurs or extra sounds Lungs: Clear to auscultation, breathing unlabored, no rales or wheezes Abdomen: Positive bowel sounds, soft, very mildly tender to palpation throughout but no significant or focal tenderness, nondistended, Extremities: No clubbing, cyanosis, or edema Skin: No evidence of breakdown, no evidence of rash  PRIOR EXAM: Neurologic: Cranial nerves II through XII intact, motor strength is 5/5 in left  deltoid, bicep, tricep, grip, hip flexor, knee extensors, ankle dorsiflexor and plantar flexor 3- Right delt, biceps, 3- wrist and finger flexors and extensor 3- Right HF, KE, ADF  Musculoskeletal: Full range of motion in all 4 extremities. Right wrist drop no pain with ROM , no wrist effusion mild pain with sup/pronation, mild pain to palp over R ulnar styloid    Assessment/Plan: 1. Functional deficits which require 3+ hours per day of interdisciplinary therapy in a comprehensive inpatient rehab setting. Physiatrist is providing close team supervision and 24 hour management of active  medical problems listed below. Physiatrist and rehab team continue to assess barriers to discharge/monitor patient progress toward functional and medical goals  Care Tool:  Bathing    Body parts bathed by patient: Right arm, Face   Body parts bathed by helper: Buttocks, Left arm, Front perineal area, Right upper leg, Left upper leg, Abdomen, Chest, Left lower leg, Right lower leg     Bathing assist Assist Level: Total Assistance - Patient < 25%     Upper Body Dressing/Undressing Upper body dressing   What is the patient wearing?: Pull over shirt, Button up shirt    Upper body assist Assist Level: Maximal Assistance - Patient 25 - 49%    Lower Body Dressing/Undressing Lower body dressing      What is the patient wearing?: Pants, Underwear/pull up     Lower body assist Assist for lower body dressing: Total Assistance - Patient < 25%     Toileting Toileting Toileting Activity did not occur (Clothing management and hygiene only): N/A (no void or bm)  Toileting assist Assist for toileting: Total Assistance - Patient < 25% (simulated)     Transfers Chair/bed transfer  Transfers assist     Chair/bed transfer assist level: Maximal Assistance - Patient 25 - 49%     Locomotion Ambulation   Ambulation assist  Ambulation activity did not occur: Safety/medical concerns          Walk 10 feet activity   Assist  Walk 10 feet activity did not occur: Safety/medical concerns        Walk 50 feet activity   Assist Walk 50 feet with 2 turns activity did not occur: Safety/medical concerns         Walk 150 feet activity   Assist Walk 150 feet activity did not occur: Safety/medical concerns         Walk 10 feet on uneven surface  activity   Assist Walk 10 feet on uneven surfaces activity did not occur: Safety/medical concerns         Wheelchair     Assist Is the patient using a wheelchair?: Yes Type of Wheelchair: Manual    Wheelchair  assist level: Dependent - Patient 0% Max wheelchair distance: 283ft    Wheelchair 50 feet with 2 turns activity    Assist        Assist Level: Dependent - Patient 0%   Wheelchair 150 feet activity     Assist      Assist Level: Dependent - Patient 0%   Blood pressure (!) 153/64, pulse 62, temperature 98.1 F (36.7 C), temperature source Oral, resp. rate 16, height 5\' 5"  (1.651 m), weight 58.5 kg, SpO2 97 %.  Medical Problem List and Plan: 1. 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  Antithrombotics: -DVT/anticoagulation:  Pharmaceutical: Eliquis according to pharmacy given age and GFR, will use 2.5mg  BID              -antiplatelet therapy: N/A 3. Pain Management: Tylenol as needed, muscle rub cream PRN -Hx of endstage OA of knees gets gel and cortisone injections last performed ~3wks ago per daughter 4. Mood/Behavior/Sleep: Namenda 10 mg twice daily, melatonin 3mg  QHS PRN             -antipsychotic agents: N/A  Slept well, pleasant today  5. Neuropsych/cognition: This patient is not capable of making decisions on her own behalf. Has moderate dementia 6. Skin/Wound Care: Routine skin checks 7. Fluids/Electrolytes/Nutrition: Routine in and outs with follow-up chemistries weekly starting 03/24/22  -Cont Vit D3 400IU daily 8.  Atrial fibrillation with RVR LAA thrombus.  Continue Eliquis, Cardizem 360 mg daily.  Cardiac rate controlled 3/19 9.  UTI/Proteus Mirabilis.  Completed  course of Rocephin -03/23/22 family concerned with urine still being amber; encourage PO fluids, monitor for cloudiness or UTI symptoms/behavior changes 10.   Hyperlipidemia.  Lipitor 80mg  QD 11.  Essential hypertension.  Continue Cardizem as directed  [id number] mild systolic elevation - check ortho vitals  Vitals:   03/23/22 1304 03/23/22 2002 03/24/22 0554 03/24/22 1344  BP: (!) 122/58 (!) 160/61 (!) 129/56 (!) 123/56   03/24/22 2004 03/25/22 1356 03/25/22 1956 03/26/22 0550  BP: (!) 156/75 126/68 (!) 173/82 (!) 162/55   03/26/22 0905 03/26/22 1423 03/26/22 1951 03/27/22 0533  BP: (!) 161/74 (!) 157/67 (!) 155/77 (!) 153/64    12.  Diabetes mellitus.  Diet controlled at baseline.  Hemoglobin A1c 5.6. 13.  Chronic bronchitis.  Continue Dulera and Incruse inhalers as indicated 14. Urinary incontinence: daughter wants purewick- I think due to her issues, that's reasonable and timed voiding q4 hours during day- since cannot get her OOB easily at night due to her stroke- hopefully will improve. -03/23/22 family with questions, discussed that purewick is likely temporary unless planning to get one at home; defer to weekday team regarding purewick duration 15. Constipation with incontinence- suggest Sorbitol once seen therapy, so can get her on [facility] -3/19- incont BM last noc takes Colace and Senokot at home; ordered Colace 100mg  QD, Senokot 1tab QD, and Sorbitol 62ml PRN, monitor   LBM 3/20 lg type 4 Family reports blood in stool but not noted by Nsg , check stool OB and a CBC, has been constipated but with multiple BMs associated with laxative use  LOS: 7 days A FACE TO FACE EVALUATION WAS PERFORMED  Charlett Blake 03/27/2022, 8:29 AM

## 2022-03-27 NOTE — Progress Notes (Signed)
Physical Therapy Session Note  Patient Details  Name: Bridget [name] MRN: [id number] Date of Birth: [birth year]  Today's Date: 03/27/2022 PT Individual Time: 1st Treatment Session: 1000-1030; 2nd Treatment Session: 1300-1400  PT Individual Time Calculation (min): 30 min; 60 min  Short Term Goals: Week 1:  PT Short Term Goal 1 (Week 1): Pt will perform transfers w/ Mod A using LRAD PT Short Term Goal 2 (Week 1): Pt will tolerate standing for >2 minutes PT Short Term Goal 3 (Week 1): Pt will intiate gait of at [place] 5 feet  Skilled Therapeutic Interventions/Progress Updates:  1st Treatment Session- Patient greeted sitting upright in wheelchair in room with daughter present and agreeable to PT treatment session. Hands-on family training completed in order to ensure a safe discharge home and decrease caregiver burden of care. Patient wheeled to rehab gym for time management and energy conservation. Patient performed lateral transfer to/from wheelchair and mat table via slideboard and [place] from daughter (with therapist providing Min/Hands-on assist for safety). Daughter demonstrated good recall of proper wheelchair set-up and management of wheelchair parts. Minor VC throughout transfers for board placement, crossing one LE over the other in order to improve bottom clearance and assist with board placement, proper hand placement, proper placement of daughter in front of patient in order to provide the proper amount of support, etc. Patient returned to her room sitting upright in wheelchair with daughter present and all needs met.    2nd Treatment Session- Patient greeted sitting upright in wheelchair with daughter present and agreeable to PT treatment session- Patient denied pain throughout treatment session. Daughter reported practicing a slideboard transfer to/from drop-arm bari-BSC with OT earlier this morning and agreeable to practicing again this afternoon. Patient agreeable to practicing [facility] transfer  and reports need to void. Patient performed slideboard transfer to/from wheelchair and [facility] with [place] from patient's daughter and therapist. When transferring to the commode, patient was able to assist with the final few scoots in order to be completely on and aligned with the commode. Patient leaned L and R several times with MinA in order to doff brief and pants. Patient was able to have a continent void and perform pericare with assistance for tearing the toilet paper with her R hand. Therapist demonstrated how to don brief while patient is sitting on the [facility] with daughter reporting understanding. In order to pull briefs and pants up, therapist assisted with lifting patient's bottom into a mini-stand with good effort from the patient while daughter pulled brief and pants over hips. While transferring back to wheelchair, patient's daughter required minor VC for proper sequencing and board placement. With [facility] assistance and VC, patient was able to scoot the remaining distance to the wheelchair with Min/ModA from therapist. Therapist answered all questions throughout treatment session and provided extensive education regarding all options for toileting and pericare. Patient left sitting upright in wheelchair with daughter present and all needs met.     Therapy Documentation Precautions:  Precautions Precautions: Fall Precaution Comments: watch HR, HOH, R-hemi, Restrictions Weight Bearing Restrictions: No  Therapy/Group: Individual Therapy  Bridget [name] 03/27/2022, 7:55 AM

## 2022-03-27 NOTE — Progress Notes (Signed)
Occupational Therapy Session Note  Patient Details  Name: TAHJA EIDEM MRN: [id number] Date of Birth: 03/23/1921  Today's Date: 03/27/2022 OT Individual Time: 0805-0905 session 1  OT Individual Time Calculation (min): 60 min  Session 2: [id number]   Short Term Goals: Week 1:  OT Short Term Goal 1 (Week 1): Pt will feed self with min A ~50% of meal using adaptive strategies and AD PRN OT Short Term Goal 2 (Week 1): Pt will complete squat pivot transfers with mod A consistently OT Short Term Goal 3 (Week 1): Pt will complete LB dressing mod A  Skilled Therapeutic Interventions/Progress Updates:  Session 1: Pt greeted supine in bed with with great granddaughter present, pt agreeable to OT intervention. With assist from great granddaughter, worked on dressing from bed level to decrease burden of care. Pt currently requires total A - MAX A +2 for all dressing from bed level. Able to roll R<>L with MAX A to pull pants to waist line. Pt completed supine>sit with MAXA, pt initially assisting with maneuvering BLEs to EOB but then stops and states "that all I can do" assisted pt remainder of distance.    Pt completed SB transfer to w/c to L side with MODA with education provided throughout to family  on set- up and technique. Once in w/c transported pt to gym with family present and provided fam education on SB transfers to EOM to L side. Education provided on placement of board, scooting technique, head/hips relationship, and importance of stabilizing the board. Granddaughter and daughter both demonstrated good technique with transfer.    Ended session with pt seated in w/c with all needs within reach.   Session 2: Pt greeted seated in w/c with daughter present, pt agreeable to OT intervention. Session focus on family education with daughter on SB transfers to [facility]. Visual demo provided on transfer technique, this therapist provided total A to place board and pt able to complete SB transfer to L side  with MODA +2 with daughter holding the board. Once on [facility] provided education on various techniques for pericare such as lateral leans or scooting back to bed to complete pericare from supine. Education provided on option of scooting to [facility] with no LB garments with pillow case on SB to protect skin integrity and then scoot back to bed for pericare.   Had daughter complete return transfer back to w/c to L side with daughter needing MAX cues for set- up and technique and therapist to ultimately take over transfer for safety as pt began leaning too far R. Pt required MOD A +2 ( +2 to stabilize board) to scoot back to w/c.   Total A transport to gym where pt completed Sb transfer to EOM to R side with as little as MIN A, +2 still to hold board. Pt did seem to do better transferring to weaker side. Once EOM worked on lateral lean techniques with theraband placed around pts waist to simulate doffing LB clothing and pt instructed to lean R<>L to pull band down. Pt only able to use LUE to maneuver band down but did well leaning to R side. Had pt also lateral lean to place bean bags underneath buttock to practice pericare. Pt able to place bean bags under buttock but not close enough to rectum to complete pericare. Additionally practiced an anterior weight shift with pt still having difficulty leaning forward enough to access rectum for pericare.  Ended session with pt seated in w/c with all needs within reach.                 Therapy Documentation Precautions:  Precautions Precautions: Fall Precaution Comments: watch HR, HOH, R-hemi, Restrictions Weight Bearing Restrictions: No  Pain: Session 1: unrated pain reported in neck, rest breaks provided as needed as well as distraction.  Session 2: unrated pain reported in buttock during SB transfers, repositioning of board provided as needed.    Therapy/Group: Individual Therapy  Corinne Ports [facility] 03/27/2022, 12:11 PM

## 2022-03-28 LAB — CBC
HCT: 40 % (ref 36.0–46.0)
Hemoglobin: 13.2 g/dL (ref 12.0–15.0)
MCH: 33 pg (ref 26.0–34.0)
MCHC: 33 g/dL (ref 30.0–36.0)
MCV: 100 fL (ref 80.0–100.0)
Platelets: 223 10*3/uL (ref 150–400)
RBC: 4 MIL/uL (ref 3.87–5.11)
RDW: 13.8 % (ref 11.5–15.5)
WBC: 4.1 10*3/uL (ref 4.0–10.5)
nRBC: 0 % (ref 0.0–0.2)

## 2022-03-28 NOTE — Progress Notes (Signed)
Physical Therapy Weekly Progress Note  Patient Details  Name: Bridget [name] MRN: [id number] Date of Birth: Jun 17, 1921  Beginning of progress report period: March 21, 2022 End of progress report period: March 28, 2022  Today's Date: 03/28/2022 PT Individual Time: [id number] PT Individual Time Calculation (min): 53 min   Patient has met 0 of 3 short term goals. Pt has made slow progress w tranfers. Pt is now performing slide board transfer w/ Mod A +2. Pt has not bene able to stand completley for more than 1 minute, and requires Max A +2 to achieve this time. Gait has not been initiated due to pt's intolerance to standing, due to severe B knee OA and variable willingness to participate. Therapy sessions have shifted to increasing caregiver education and finding ways to safely and functionally transfer pt.   Patient continues to demonstrate the following deficits muscle weakness and muscle joint tightness, decreased cardiorespiratoy endurance, motor apraxia, decreased attention, decreased awareness, decreased safety awareness, decreased memory, and delayed processing, and decreased sitting balance, decreased standing balance, decreased postural control, hemiplegia, and decreased balance strategies and therefore will continue to benefit from skilled PT intervention to increase functional independence with mobility.  Patient not progressing toward long term goals.  See goal revision..  Pt is not progressing towards ambulation due to severe B knee OA and acute post-CVA impairments. Continue plan of care.  PT Short Term Goals Week 1:  PT Short Term Goals - Week 1 PT Short Term Goal 1 (Week 1): Pt will perform transfers w/ Mod A using LRAD PT Short Term Goal 1 - Progress (Week 1): Not met PT Short Term Goal 2 (Week 1): Pt will tolerate standing for >2 minutes PT Short Term Goal 2 - Progress (Week 1): Not met PT Short Term Goal 3 (Week 1): Pt will intiate gait of at [place] 5 feet PT Short Term Goal 3 -  Progress (Week 1): Not met PT Short Term Goals - Week 2 PT Short Term Goal 1 (Week 2): STG=LTG due to ELOS  Skilled Therapeutic Interventions/Progress Updates:   Pt received sitting in [facility] w/ daughter present. Pt agreeable to PT session. The focus of today's interventions was caregiver education on safely performing slide board transfers. Pt transported dependently to ortho gym in [facility]. Pt's daughter assisted pt w/ L slide board transfer from [facility] to EOM. Pt transferred w/ Mod A +3. Education provided to caregiver on [facility] set-up, board placement and safe handling, and sequence of transfer. Pt required mod vc to initiate and complete transfer. Therapist noticed that pt tends to pull instead of push to slide. Using that information, therapist modified technique so pt would lean to the L then use LUE to pull, which assisted in a more functional transfer. Pt performed R slide board transfer from EOM back to [facility] w/ Mod A+3. Pt leaned to the R and using RUE as much as tolerable pulled w/ assistance due to intermittent R wrist pain.    Pt transferred back to room dependently and was left seated in [facility] in the care of daughter w/ all needs met.   Therapy Documentation Precautions:  Precautions Precautions: Fall Precaution Comments: watch HR, HOH, R-hemi, Restrictions Weight Bearing Restrictions: No General:   Pain: pt denies any pain        Therapy/Group: Individual Therapy  Danyiel Crespin 03/28/2022, 4:45 PM

## 2022-03-28 NOTE — Progress Notes (Signed)
Occupational Therapy Weekly Progress Note  Patient Details  Name: Bridget [name] MRN: [id number] Date of Birth: July 31, 1921  Beginning of progress report period: March 21, 2022 End of progress report period: April 01, 2022  Today's Date: 03/28/2022 OT Individual Time: [id number] OT Individual Time Calculation (min): 60 min    Patient has met 1 of 3 short term goals.  Pt has made some progress with self feeding but has not been able to progress with her squat pivot transfers and LB self care due to arthritic pain, hemiplegia, cognitive limitations. The focus of therapy has shifted from pt progressing with mobility to finding the safest ways for her to transfer requiring the least burden of care.   Patient continues to demonstrate the following deficits: muscle weakness and muscle joint tightness, decreased cardiorespiratoy endurance, motor apraxia, decreased attention to right, decreased initiation, decreased attention, decreased awareness, decreased problem solving, decreased safety awareness, decreased memory, and delayed processing, and decreased sitting balance, decreased standing balance, decreased postural control, hemiplegia, and decreased balance strategies and therefore will continue to benefit from skilled OT intervention to enhance overall performance with BADL and Reduce care partner burden.  Patient not progressing toward long term goals.  See goal revision..  Plan of care revisions: .Marland Kitchen Problem: Sit to Stand Goal: LTG:  Patient will perform sit to stand in prep for activites of daily living with assistance level (OT) Description: LTG:  Patient will perform sit to stand in prep for activites of daily living with assistance level (OT) Flowsheets (Taken 03/27/2022 1308) LTG: PT will perform sit to stand in prep for activites of daily living with assistance level: (LTG discontinued as pt is not able to tolerate putting weight on her legs due to severe osteoarthritis.) -- Note: LTG  discontinued as pt is not able to tolerate putting weight on her legs due to severe osteoarthritis.    Problem: RH Tub/Shower Transfers Goal: LTG Patient will perform tub/shower transfers w/assist (OT) Description: LTG: Patient will perform tub/shower transfers with assist, with/without cues using equipment (OT) Flowsheets (Taken 03/27/2022 1308) LTG: Pt will perform tub/shower stall transfers with assistance level of: (LTG discontinued due to safety concerns with transfers.  Pt's daughter reports that pt prefers sponge baths.) -- Note: LTG discontinued due to safety concerns with transfers. Pt's daughter reports that pt prefers sponge baths.    Problem: RH Memory Goal: LTG Patient will demonstrate ability for day to day recall/carry over during activities of daily living with assistance level (OT) Description: LTG:  Patient will demonstrate ability for day to day recall/carry over during activities of daily living with assistance level (OT). Flowsheets (Taken 03/27/2022 1308) LTG:  Patient will demonstrate ability for day to day recall/carry over during activities of daily living with assistance level (OT): (LTG discontinued as pt has severe dementia and has no ability to recall.) -- Note: LTG discontinued as pt has severe dementia and has no ability to recall.   Problem: RH Balance Goal: LTG: Patient will maintain dynamic sitting balance (OT) Description: LTG:  Patient will maintain dynamic sitting balance with assistance during activities of daily living (OT) Flowsheets (Taken 03/27/2022 1302) LTG: Pt will maintain dynamic sitting balance during ADLs with: (LTG downgraded due to slow progress and difficulty with learning due to cognitive deficits.) Minimal Assistance - Patient > 75% Note: LTG downgraded due to slow progress and difficulty with learning due to cognitive deficits.    Problem: RH Bathing Goal: LTG Patient will bathe all body parts with assist levels (OT)  Description: LTG:  Patient will bathe all body parts with assist levels (OT) Flowsheets (Taken 03/27/2022 1302) LTG: Pt will perform bathing with assistance level/cueing: (LTG downgraded due to slow progress and difficulty with learning due to cognitive deficits.) Maximal Assistance - Patient 25 - 49% LTG: Position pt will perform bathing: Edge of bed Note: LTG downgraded due to slow progress and difficulty with learning due to cognitive deficits.    Problem: RH Dressing Goal: LTG Patient will perform upper body dressing (OT) Description: LTG Patient will perform upper body dressing with assist, with/without cues (OT). Flowsheets (Taken 03/27/2022 1302) LTG: Pt will perform upper body dressing with assistance level of: (LTG downgraded due to slow progress and difficulty with learning due to cognitive deficits.) Moderate Assistance - Patient 50 - 74% Note: LTG downgraded due to slow progress and difficulty with learning due to cognitive deficits.  Goal: LTG Patient will perform lower body dressing w/assist (OT) Description: LTG: Patient will perform lower body dressing with assist, with/without cues in positioning using equipment (OT) Flowsheets (Taken 03/27/2022 1302) LTG: Pt will perform lower body dressing with assistance level of: (LTG downgraded due to slow progress and difficulty with learning due to cognitive deficits.) Total Assistance - Patient < 25% Note: LTG downgraded due to slow progress and difficulty with learning due to cognitive deficits.    Problem: RH Toileting Goal: LTG Patient will perform toileting task (3/3 steps) with assistance level (OT) Description: LTG: Patient will perform toileting task (3/3 steps) with assistance level (OT)  Flowsheets (Taken 03/27/2022 1302) LTG: Pt will perform toileting task (3/3 steps) with assistance level: (LTG downgraded due to slow progress and difficulty with learning due to cognitive deficits.) Total Assistance - Patient < 25% Note: LTG downgraded due to slow  progress and difficulty with learning due to cognitive deficits.    Problem: RH Functional Use of Upper Extremity Goal: LTG Patient will use RT/LT upper extremity as a (OT) Description: LTG: Patient will use right/left upper extremity as a stabilizer/gross assist/diminished/nondominant/dominant level with assist, with/without cues during functional activity (OT) Flowsheets (Taken 03/27/2022 1302) LTG: Use of upper extremity in functional activities: (LTG downgraded due to slow progress and difficulty with learning due to cognitive deficits.) RUE as gross assist level LTG: Pt will use upper extremity in functional activity with assistance level of: Moderate Assistance - Patient 50 - 74% Note: LTG downgraded due to slow progress and difficulty with learning due to cognitive deficits.    Problem: RH Toilet Transfers Goal: LTG Patient will perform toilet transfers w/assist (OT) Description: LTG: Patient will perform toilet transfers with assist, with/without cues using equipment (OT) Flowsheets (Taken 03/27/2022 1302) LTG: Pt will perform toilet transfers with assistance level of: (LTG downgraded due to slow progress and difficulty with learning due to cognitive deficits.) Maximal Assistance - Patient 25 - 49% Note: LTG downgraded due to slow progress and difficulty with learning due to cognitive deficits.     OT Short Term Goals Week 1:  OT Short Term Goal 1 (Week 1): Pt will feed self with min A ~50% of meal using adaptive strategies and AD PRN OT Short Term Goal 1 - Progress (Week 1): Met OT Short Term Goal 2 (Week 1): Pt will complete squat pivot transfers with mod A consistently OT Short Term Goal 2 - Progress (Week 1): Not progressing OT Short Term Goal 3 (Week 1): Pt will complete LB dressing mod A OT Short Term Goal 3 - Progress (Week 1): Not progressing Week 2:  OT  Short Term Goal 1 (Week 2): STGs = LTGs  Skilled Therapeutic Interventions/Progress Updates:    Pt received in w/c with  daughter present and ready to participate in therapy.  Discussed spending more time practicing with slide board transfers to the wide drop arm BSC and practicing clothing management. Overall this session, pt needed max A of 1, plus A from daughter to hold board as she moved to [facility] and from [facility].  On BSC, max A to lean side to side with total A with getting pants down.  Pt was incontinent of urine. Had pt cleanse herself with max cues.  Total A to get pants over hips and don brief.  The process of toileting with clothing management is quite time consuming as the steps of the process need to go slowly to avoid causing discomfort to the patient.  Her daughter will need A from the pt's granddtr.  Daughter drew a diagram of bedroom and OT discussed recommended set up of hospital bed, BSC and wc to facilitate the smoothest transfers.  Pt resting in wc with disposable warm packs in pillow case on her neck.    Therapy Documentation Precautions:  Precautions Precautions: Fall Precaution Comments: watch HR, HOH, R-hemi, Restrictions Weight Bearing Restrictions: No   Vital Signs: Oxygen Therapy SpO2: 96 % O2 Device: Room Air Pain: Pain Assessment Pain Scale: 0-10 Pain Score: 3  Faces Pain Scale: Hurts a little bit Pain Type: Acute pain Pain Location: Generalized Pain Intervention(s): Medication (See eMAR) Pt c/o pain frequently during session in various body areas.  C/o neck pain, provided pt with small heat packs to place in pillow case for neck relief.  ADL: ADL Eating: Minimal assistance, Minimal cueing Where Assessed-Eating: Bed level Grooming: Moderate assistance Where Assessed-Grooming: Edge of bed Upper Body Bathing: Moderate assistance Where Assessed-Upper Body Bathing: Edge of bed Lower Body Bathing: Maximal assistance Where Assessed-Lower Body Bathing: Edge of bed Upper Body Dressing: Maximal assistance Where Assessed-Upper Body Dressing: Edge of bed Lower Body Dressing: Dependent,  Maximal assistance Where Assessed-Lower Body Dressing: Edge of bed Toileting: Maximal assistance, Dependent (simulated) Where Assessed-Toileting: Bedside Commode Toilet Transfer: Dependent Toilet Transfer Method:  (+2 stabilizing wc for safety) Science writer: Radiographer, therapeutic: Not assessed Social research officer, government: Dependent (simulated) Social research officer, government Method: Education officer, environmental: Shower seat with back  Therapy/Group: Individual Therapy  Rhett Najera 03/28/2022, 12:58 PM

## 2022-03-28 NOTE — Plan of Care (Signed)
  Problem: RH Ambulation Goal: LTG Patient will ambulate in controlled environment (PT) Description: LTG: Patient will ambulate in a controlled environment, # of feet with assistance (PT). Outcome: Not Applicable Note: Pt is not progress into toward ambulation d/t severe Bil knee OA and new onset R sided weakness  from CVA.  Goal: LTG Patient will ambulate in home environment (PT) Description: LTG: Patient will ambulate in home environment, # of feet with assistance (PT). Outcome: Not Applicable Note: Pt is not progress into toward ambulation d/t severe Bil knee OA and new onset R sided weakness  from CVA.  Goal: LTG Patient will ambulate in community environment (PT) Description: LTG: Patient will ambulate in community environment, # of feet with assistance (PT). Outcome: Not Applicable Note: Pt is not progress into toward ambulation d/t severe Bil knee OA and new onset R sided weakness  from CVA.    Problem: RH Wheelchair Mobility Goal: LTG Patient will propel w/c in community environment (PT) Description: LTG: Patient will propel wheelchair in community environment, # of feet with assist (PT) Outcome: Not Applicable Note: Pt will be able to use w/c for in-home mobility but will not be a community mobilizer without dependent physical assist.    Problem: RH Stairs Goal: LTG Patient will ambulate up and down stairs w/assist (PT) Description: LTG: Patient will ambulate up and down # of stairs with assistance (PT) Outcome: Not Applicable Note: Pt is unable to stand at this time and is not progressing toward ambulation d/t progression of severe knee OA bilaterally and new onset R sided weakness.

## 2022-03-28 NOTE — Progress Notes (Signed)
Physical Therapy Session Note  Patient Details  Name: Bridget [name] MRN: [id number] Date of Birth: Sep 28, 1921  Today's Date: 03/28/2022 PT Individual Time: [id number] PT Individual Time Calculation (min): 29 min  PT Amount of Missed Time (min): 16 Minutes PT Missed Treatment Reason: Patient unwilling to participate;Patient fatigue  Short Term Goals: Week 1:  PT Short Term Goal 1 (Week 1): Pt will perform transfers w/ Mod A using LRAD PT Short Term Goal 2 (Week 1): Pt will tolerate standing for >2 minutes PT Short Term Goal 3 (Week 1): Pt will intiate gait of at [place] 5 feet  Skilled Therapeutic Interventions/Progress Updates:    Pt received seated in [facility] w/ daughter present. Pt denies any pain, however, seemingly fatigued from earlier sessions. Attempted L knee mobilizations w/ pt seated in WC, however, pt did not tolerate and requested to not have it done. Encouraged pt to participate in seated open chain exercises, however, pt continually stated that she couldn't lift her leg, instead pressed her leg down into foot rest. Therapist tried to encourage pt to participate, however, she remained adamant on not engaging. Therapist spent time educating pt's daughter on ways to ensure pt and caregiver safety and improved function upon D/C, including wheelchair management, car transfers, toileting and home set up. Therapist verbalized education and assured daughter that they would spend time practicing.   Pt missed 16 minutes of skilled PT care due to unwillingness to participate and fatigue. Will follow up w/ pt to make up time as pt's and therapist's time allows.   Therapy Documentation Precautions:  Precautions Precautions: Fall Precaution Comments: watch HR, HOH, R-hemi, Restrictions Weight Bearing Restrictions: No General: PT Amount of Missed Time (min): 16 Minutes PT Missed Treatment Reason: Patient unwilling to participate;Patient fatigue Pain: pt denied any pain        Therapy/Group:  Individual Therapy  Katrenia Alkins 03/28/2022, 12:31 PM

## 2022-03-28 NOTE — Progress Notes (Signed)
Occupational Therapy Note  Patient Details  Name: Bridget [name] MRN: [id number] Date of Birth: 08-03-1921    The following the equipment is recommended by occupational therapy to increase pt's ability to perform ADL and decrease burden of care:    Pt will need a wide drop arm Bedside Commode (the bariatric version with a flat surface) as the flat surface is the safest method for her to complete a slide board transfer.  If the slide board is used with a regular drop arm BSC, the board is less stable and pt complains the board is pinching her as it does not lay as flat. Due to severe osteoarthritis in B knees and R hemiplegia, pt is no longer able to stand or complete a squat pivot. The board takes pressure off her knees.  This also enables a less strenuous transfer for her caregivers (her daughter and granddaughter).  The flat surface also makes it possible for pt to do more of a lateral lean, so her caregivers can manage clothing over hips.  Pt is not able to tolerate any standing for clothing management.    Pt will also benefit from a Purewick to use at night.  She has frequent nighttime incontinence and it will not be feasible for her to access the [facility] numerous times in the evening.  Due to her thin skin, the purewick will help to prevent skin breakdown from wetness from the briefs.            Ely 03/28/2022, 8:45 AM

## 2022-03-28 NOTE — Progress Notes (Signed)
Patient ID: Bridget [name], female   DOB: Aug 18, 1921, 89 y.o.   MRN: [id number]  Family education arranged tentatively for next Wednesday or Thursday 9 am- 12 pm.

## 2022-03-28 NOTE — Progress Notes (Signed)
PROGRESS NOTE   Subjective/Complaints:  No further blood in stool reported , has not had CBC drawn yet, pt was sleeping when phlebotomy came in family did not want pt to be awakened Pt seen in PT, progressing with transfers   ROS- limited by cognition  Objective:   No results found. No results for input(s): "WBC", "HGB", "HCT", "PLT" in the last 72 hours.  No results for input(s): "NA", "K", "CL", "CO2", "GLUCOSE", "BUN", "CREATININE", "CALCIUM" in the last 72 hours.   Intake/Output Summary (Last 24 hours) at 03/28/2022 0906 Last data filed at 03/28/2022 0551 Gross per 24 hour  Intake 240 ml  Output 1100 ml  Net -860 ml         Physical Exam: Vital Signs Blood pressure (!) 154/76, pulse 70, temperature 97.8 F (36.6 C), temperature source Oral, resp. rate 18, height 5\' 5"  (1.651 m), weight 58.5 kg, SpO2 96 %.   General: No acute distress, sitting in w/c, a little more irritable this morning Heart: Regular rate and rhythm no rubs murmurs or extra sounds Lungs: Clear to auscultation, breathing unlabored, no rales or wheezes Abdomen: Positive bowel sounds, soft, very mildly tender to palpation throughout but no significant or focal tenderness, nondistended, Extremities: No clubbing, cyanosis, or edema Skin: No evidence of breakdown, no evidence of rash  PRIOR EXAM: Neurologic: Cranial nerves II through XII intact, motor strength is 5/5 in left  deltoid, bicep, tricep, grip, hip flexor, knee extensors, ankle dorsiflexor and plantar flexor 3- Right delt, biceps, 3- wrist and finger flexors and extensor 3- Right HF, KE, ADF  Musculoskeletal: Full range of motion in all 4 extremities. Right wrist drop no pain with ROM , no wrist effusion mild pain with sup/pronation, mild pain to palp over R ulnar styloid    Assessment/Plan: 1. Functional deficits which require 3+ hours per day of interdisciplinary therapy in a  comprehensive inpatient rehab setting. Physiatrist is providing close team supervision and 24 hour management of active medical problems listed below. Physiatrist and rehab team continue to assess barriers to discharge/monitor patient progress toward functional and medical goals  Care Tool:  Bathing    Body parts bathed by patient: Right arm, Face   Body parts bathed by helper: Buttocks, Left arm, Front perineal area, Right upper leg, Left upper leg, Abdomen, Chest, Left lower leg, Right lower leg     Bathing assist Assist Level: Total Assistance - Patient < 25%     Upper Body Dressing/Undressing Upper body dressing   What is the patient wearing?: Pull over shirt, Button up shirt    Upper body assist Assist Level: Maximal Assistance - Patient 25 - 49%    Lower Body Dressing/Undressing Lower body dressing      What is the patient wearing?: Pants, Underwear/pull up     Lower body assist Assist for lower body dressing: Total Assistance - Patient < 25%     Toileting Toileting Toileting Activity did not occur (Clothing management and hygiene only): N/A (no void or bm)  Toileting assist Assist for toileting: Total Assistance - Patient < 25% (simulated)     Transfers Chair/bed transfer  Transfers assist     Chair/bed  transfer assist level: Maximal Assistance - Patient 25 - 49%     Locomotion Ambulation   Ambulation assist   Ambulation activity did not occur: Safety/medical concerns          Walk 10 feet activity   Assist  Walk 10 feet activity did not occur: Safety/medical concerns        Walk 50 feet activity   Assist Walk 50 feet with 2 turns activity did not occur: Safety/medical concerns         Walk 150 feet activity   Assist Walk 150 feet activity did not occur: Safety/medical concerns         Walk 10 feet on uneven surface  activity   Assist Walk 10 feet on uneven surfaces activity did not occur: Safety/medical concerns          Wheelchair     Assist Is the patient using a wheelchair?: Yes Type of Wheelchair: Manual    Wheelchair assist level: Dependent - Patient 0% Max wheelchair distance: 226ft    Wheelchair 50 feet with 2 turns activity    Assist        Assist Level: Dependent - Patient 0%   Wheelchair 150 feet activity     Assist      Assist Level: Dependent - Patient 0%   Blood pressure (!) 154/76, pulse 70, temperature 97.8 F (36.6 C), temperature source Oral, resp. rate 18, height 5\' 5"  (1.651 m), weight 58.5 kg, SpO2 96 %.  Medical Problem List and Plan: 1. 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  Antithrombotics: -DVT/anticoagulation:  Pharmaceutical: Eliquis according to pharmacy given age and GFR, will use 2.5mg  BID              -antiplatelet therapy: N/A 3. Pain Management: Tylenol as needed, muscle rub cream PRN -Hx of endstage OA of knees gets gel and cortisone injections last performed ~3wks ago per daughter 4. Mood/Behavior/Sleep: Namenda 10 mg twice daily, melatonin 3mg  QHS PRN             -antipsychotic agents: N/A  Slept well, pleasant today  5. Neuropsych/cognition: This patient is not capable of making decisions on her own behalf. Has moderate dementia- needs to be reminded everyday that she has suffered a stroke , appears surprised everyday  6. Skin/Wound Care: Routine skin checks 7. Fluids/Electrolytes/Nutrition: Routine in and outs with follow-up chemistries weekly starting 03/24/22  -Cont Vit D3 400IU daily 8.  Atrial fibrillation with RVR LAA thrombus.  Continue Eliquis, Cardizem 360 mg daily.  Cardiac rate controlled  3/19 9.  UTI/Proteus Mirabilis.  Completed  course of Rocephin -03/23/22 family concerned with urine still being amber; encourage PO fluids, monitor for cloudiness or UTI symptoms/behavior changes 10.  Hyperlipidemia.  Lipitor 80mg  QD 11.  Essential hypertension.  Continue Cardizem as directed  [id number] mild systolic elevation - check ortho vitals  Vitals:   03/24/22 1344 03/24/22 2004 03/25/22 1356 03/25/22 1956  BP: (!) 123/56 (!) 156/75 126/68 (!) 173/82   03/26/22 0550 03/26/22 0905 03/26/22  1423 03/26/22 1951  BP: (!) 162/55 (!) 161/74 (!) 157/67 (!) 155/77   03/27/22 0533 03/27/22 1423 03/27/22 2021 03/28/22 0549  BP: (!) 153/64 (!) 152/76 (!) 146/60 (!) 154/76    12.  Diabetes mellitus.  Diet controlled at baseline.  Hemoglobin A1c 5.6. 13.  Chronic bronchitis.  Continue Dulera and Incruse inhalers as indicated 14. Urinary incontinence: daughter wants purewick- I think due to her issues, that's reasonable and timed voiding q4 hours during day- since cannot get her OOB easily at night due to her stroke- hopefully will improve. -03/23/22 family with questions, discussed that purewick is likely temporary unless planning to get one at home; defer to weekday team regarding purewick duration 15. Constipation with incontinence- suggest Sorbitol once seen therapy, so can get her on [facility] -3/19- incont BM last noc takes Colace and Senokot at home; ordered Colace 100mg  QD, Senokot 1tab QD, and Sorbitol 72ml PRN, monitor   LBM 3/20 lg type 4 Family reports blood in stool but not noted by Nsg , check stool OB and a CBC, has been constipated but with multiple BMs associated with laxative use  LOS: 8 days A FACE TO FACE EVALUATION WAS PERFORMED  Bridget [name] 03/28/2022, 9:06 AM

## 2022-03-28 NOTE — Progress Notes (Addendum)
Physical Therapy Session Note  Patient Details  Name: Bridget [name] MRN: [id number] Date of Birth: 1921/10/16  Today's Date: 03/28/2022 PT Individual Time: 0800-0844 PT Individual Time Calculation (min): 44 min   Short Term Goals: Week 1:  PT Short Term Goal 1 (Week 1): Pt will perform transfers w/ Mod A using LRAD PT Short Term Goal 2 (Week 1): Pt will tolerate standing for >2 minutes PT Short Term Goal 3 (Week 1): Pt will intiate gait of at [place] 5 feet    Skilled Therapeutic Interventions/Progress Updates:   Pt supine in bed upon arrival with granddaughter in room, pt's daughter arrived during session. Pt agreeable to therapy. Pt reports pain in R wrist, and B shoulder. Treatment focused on functional mobility, strengthening, and standing tolerance.   PT donned pants while pt supine in bed, pt performed R and L rolling with max A, therapist provided max A to don pants. Pt  performed supine to sit EOB with HOB raised, and mod A to help scoot hips to EOB.   Pt doffed night time shirt and donned buttoned up shirt while sitting EOB, pt required assist to take shirt off of involved UE, but able to pull over her head and take off uninvolved UE with supervision. Pt required assist to donn shirt on involved UE but able to put on uninvolved UE with min A, PT buttoned shirt due to decreased time.   Pt performed SBT bed/WC to R with +2 mod A, hand over hand provided to increase Wbing through R UE and improve technique, tactile cuing provided for head hip ratio and forward trunk lean.   Pt transported dependent in [facility] for energy conservation. Pt performed STS x2 in // bars with +2 mod A, Cuing provided for upright posture, hip extension. Trial 1 demos signfiicant forward trunk lean and hip flexion. Trial 2 utilized sheet around hips to facilitate hip extension. Pt able to stand for ~30 seconds each trial prior to requesting to sit down.   Pt transported dependent in [facility] to room. Pt seated in WC at end  of session with all needs within reach and family in the room.      Therapy Documentation Precautions:  Precautions Precautions: Fall Precaution Comments: watch HR, HOH, R-hemi, Restrictions Weight Bearing Restrictions: No   Therapy/Group: Individual Therapy  [facility] Alfredia Ferguson, [place], DPT  03/28/2022, 7:37 AM

## 2022-03-29 NOTE — Progress Notes (Signed)
PROGRESS NOTE   Subjective/Complaints:  Pt slept well per family, denies pain. LBM was 1-2 days ago per family but recorded as 03/26/22. Urinating well, using purewick at night but gets up with the [facility] during the day. Denies any other complaints or concerns today.   ROS- limited by cognition  Objective:   No results found. Recent Labs    03/28/22 0914  WBC 4.1  HGB 13.2  HCT 40.0  PLT 223   No results for input(s): "NA", "K", "CL", "CO2", "GLUCOSE", "BUN", "CREATININE", "CALCIUM" in the last 72 hours.   Intake/Output Summary (Last 24 hours) at 03/29/2022 1146 Last data filed at 03/29/2022 0340 Gross per 24 hour  Intake 480 ml  Output 1050 ml  Net -570 ml        Physical Exam: Vital Signs Blood pressure 124/71, pulse 63, temperature 98 F (36.7 C), resp. rate 16, height 5\' 5"  (1.651 m), weight 58.5 kg, SpO2 96 %.   General: No acute distress, sitting in bed, a little irritable this morning Heart: Regular rate and rhythm no rubs murmurs or extra sounds Lungs: Clear to auscultation, breathing unlabored, no rales or wheezes Abdomen: Positive bowel sounds, soft, very mildly tender to palpation throughout but no significant or focal tenderness and seems to resolve with distraction, nondistended, no rebound/guarding/rigidity Extremities: No clubbing, cyanosis, or edema Skin: No evidence of breakdown, no evidence of rash  PRIOR EXAM: Neurologic: Cranial nerves II through XII intact, motor strength is 5/5 in left  deltoid, bicep, tricep, grip, hip flexor, knee extensors, ankle dorsiflexor and plantar flexor 3- Right delt, biceps, 3- wrist and finger flexors and extensor 3- Right HF, KE, ADF  Musculoskeletal: Full range of motion in all 4 extremities. Right wrist drop no pain with ROM , no wrist effusion mild pain with sup/pronation, mild pain to palp over R ulnar styloid    Assessment/Plan: 1. Functional deficits  which require 3+ hours per day of interdisciplinary therapy in a comprehensive inpatient rehab setting. Physiatrist is providing close team supervision and 24 hour management of active medical problems listed below. Physiatrist and rehab team continue to assess barriers to discharge/monitor patient progress toward functional and medical goals  Care Tool:  Bathing    Body parts bathed by patient: Right arm, Face   Body parts bathed by helper: Buttocks, Left arm, Front perineal area, Right upper leg, Left upper leg, Abdomen, Chest, Left lower leg, Right lower leg     Bathing assist Assist Level: Total Assistance - Patient < 25%     Upper Body Dressing/Undressing Upper body dressing   What is the patient wearing?: Pull over shirt, Button up shirt    Upper body assist Assist Level: Maximal Assistance - Patient 25 - 49%    Lower Body Dressing/Undressing Lower body dressing      What is the patient wearing?: Pants, Underwear/pull up     Lower body assist Assist for lower body dressing: Total Assistance - Patient < 25%     Toileting Toileting Toileting Activity did not occur (Clothing management and hygiene only): N/A (no void or bm)  Toileting assist Assist for toileting: Total Assistance - Patient < 25% (simulated)  Transfers Chair/bed transfer  Transfers assist     Chair/bed transfer assist level: Maximal Assistance - Patient 25 - 49%     Locomotion Ambulation   Ambulation assist   Ambulation activity did not occur: Safety/medical concerns          Walk 10 feet activity   Assist  Walk 10 feet activity did not occur: Safety/medical concerns        Walk 50 feet activity   Assist Walk 50 feet with 2 turns activity did not occur: Safety/medical concerns         Walk 150 feet activity   Assist Walk 150 feet activity did not occur: Safety/medical concerns         Walk 10 feet on uneven surface  activity   Assist Walk 10 feet on uneven  surfaces activity did not occur: Safety/medical concerns         Wheelchair     Assist Is the patient using a wheelchair?: Yes Type of Wheelchair: Manual    Wheelchair assist level: Dependent - Patient 0% Max wheelchair distance: 24ft    Wheelchair 50 feet with 2 turns activity    Assist        Assist Level: Dependent - Patient 0%   Wheelchair 150 feet activity     Assist      Assist Level: Dependent - Patient 0%   Blood pressure 124/71, pulse 63, temperature 98 F (36.7 C), resp. rate 16, height 5\' 5"  (1.651 m), weight 58.5 kg, SpO2 96 %.  Medical Problem List and Plan: 1. 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  Antithrombotics: -DVT/anticoagulation:  Pharmaceutical: Eliquis according to pharmacy given age and GFR, will use 2.5mg  BID              -antiplatelet therapy: N/A 3. Pain Management: Tylenol as needed, muscle rub cream PRN -Hx of endstage OA of knees gets gel and cortisone injections last performed ~3wks ago per daughter 4. Mood/Behavior/Sleep: Namenda 10 mg twice daily, melatonin 3mg  QHS PRN             -antipsychotic agents: N/A  -03/29/22 sleeping well per family 5. Neuropsych/cognition: This patient is not capable of making decisions on her own behalf. Has moderate dementia- needs to be reminded everyday that she has suffered a stroke , appears surprised everyday  6. Skin/Wound Care: Routine skin checks 7. Fluids/Electrolytes/Nutrition: Routine in and outs with follow-up chemistries weekly, next 03/31/22  -Cont Vit D3 400IU daily 8.  Atrial fibrillation with RVR LAA thrombus.  Continue  Eliquis, Cardizem 360 mg daily.  Cardiac rate controlled 03/29/22 9.  UTI/Proteus Mirabilis.  Completed  course of Rocephin -03/23/22 family concerned with urine still being amber; encourage PO fluids, monitor for cloudiness or UTI symptoms/behavior changes -03/29/22 no mention of concerns of urine color or UTI symptoms, continue monitoring 10.  Hyperlipidemia.  Lipitor 80mg  QD 11.  Essential hypertension.  Continue Cardizem as directed  -[id number] mild systolic elevation - check ortho vitals   -03/29/22 BPs fairly stable/downtrending,  continue monitoring Vitals:   03/25/22 1956 03/26/22 0550 03/26/22 0905 03/26/22 1423  BP: (!) 173/82 (!) 162/55 (!) 161/74 (!) 157/67   03/26/22 1951 03/27/22 0533 03/27/22 1423 03/27/22 2021  BP: (!) 155/77 (!) 153/64 (!) 152/76 (!) 146/60   03/28/22 0549 03/28/22 1433 03/28/22 1941 03/29/22 0535  BP: (!) 154/76 (!) 140/61 (!) 148/60 124/71    12.  Diabetes mellitus.  Diet controlled at baseline.  Hemoglobin A1c 5.6. 13.  Chronic bronchitis.  Continue Dulera and Incruse inhalers as indicated 14. Urinary incontinence: daughter wants purewick- I think due to her issues, that's reasonable and timed voiding q4 hours during day- since cannot get her OOB easily at night due to her stroke- hopefully will improve. -03/23/22 family with questions, discussed that purewick is likely temporary unless planning to get one at home; defer to weekday team regarding purewick duration 15. Constipation with incontinence- suggest Sorbitol once seen therapy, so can get her on [facility] -3/19- incont BM last noc takes Colace and Senokot at home; ordered Colace 100mg  QD, Senokot 1tab QD, and Sorbitol 66ml PRN, monitor -LBM 3/20 lg type 4 -Family reports blood in stool but not noted by Nsg , check stool OB and a CBC, has been constipated but with multiple BMs associated with laxative use  -03/29/22 LBM 1-2 days ago per family, but not documented on flow sheet; cont current regimen for now, but  monitor closely. H/H stable yesterday, no further bloody stools.   LOS: 9 days A FACE TO [place] 03/29/2022, 11:46 AM

## 2022-03-30 MED ORDER — ORAL CARE MOUTH RINSE: 15.0000 mL | OROMUCOSAL | Status: DC | PRN

## 2022-03-30 MED ORDER — DOCUSATE SODIUM 100 MG PO CAPS
100.0000 mg | ORAL_CAPSULE | Freq: Two times a day (BID) | ORAL | Status: DC
  Administered 2022-03-30 – 2022-04-05 (×12): 100 mg via ORAL
  Filled 2022-03-30 (×12): qty 1

## 2022-03-30 NOTE — Progress Notes (Signed)
Occupational Therapy Session Note  Patient Details  Name: Bridget [name] MRN: [id number] Date of Birth: October 03, 1921  Today's Date: 03/30/2022 OT Individual Time: 0100-0145 OT Individual Time Calculation (min): 45 min    Short Term Goals: Week 1:  OT Short Term Goal 1 (Week 1): Pt will feed self with min A ~50% of meal using adaptive strategies and AD PRN OT Short Term Goal 1 - Progress (Week 1): Met OT Short Term Goal 2 (Week 1): Pt will complete squat pivot transfers with mod A consistently OT Short Term Goal 2 - Progress (Week 1): Not progressing OT Short Term Goal 3 (Week 1): Pt will complete LB dressing mod A OT Short Term Goal 3 - Progress (Week 1): Not progressing  Skilled Therapeutic Interventions/Progress Updates:   Patient seated at w/c LOF finishing up lunch.  The pt was able to verbalize that she rested well last night and that she had no pain at the time of treatment. I attempted to get the pt to come from sit to stand at the sink and she was Dep in relation to her level of assistance. I went on to have the pt work on UB strength using the 1lb dowel for shld flex, horizontal abduction, shld rotation and large circles 1 set of 10 with rest breaks as needed. The pt required 1 rest break with each exercise and was encouraged to incorporate relaxation breathing.  The pt went on to remove suction peg place on the sink using her  RUE, further, a yellow modified grippy sock was place on the pt's hand to improve her attentiveness to the RUE.  The pt was then asked to remove the pegs based on the color that was called, the pt was able to execute the task with 60% accuracy, the pt completed the exercise 2x.  The pt family was instructed in relation to AE to adapt fher tooth brush for greater ease, red tubing was provided at the end of the treatment session.  The pt remained at w/c LOF with her call light and bedside table within reach.  All additional needs were addressed prior to exiting the room.         Therapy Documentation Precautions:  Precautions Precautions: Fall Precaution Comments: watch HR, HOH, R-hemi, Restrictions Weight Bearing Restrictions: No  Therapy/Group: Individual Therapy  Yvonne Kendall 03/30/2022, 4:08 PM

## 2022-03-30 NOTE — Progress Notes (Signed)
PROGRESS NOTE   Subjective/Complaints:  Pt doing pretty well today, slept well again. Still no BM, ok with the idea of increasing colace just a little to avoid getting constipated. Urinating well, still uses purewick at night. Denies pain. Denies any other complaints or concerns today.   ROS- limited by cognition  Objective:   No results found. Recent Labs    03/28/22 0914  WBC 4.1  HGB 13.2  HCT 40.0  PLT 223   No results for input(s): "NA", "K", "CL", "CO2", "GLUCOSE", "BUN", "CREATININE", "CALCIUM" in the last 72 hours.   Intake/Output Summary (Last 24 hours) at 03/30/2022 1138 Last data filed at 03/30/2022 0955 Gross per 24 hour  Intake 480 ml  Output 1500 ml  Net -1020 ml        Physical Exam: Vital Signs Blood pressure (!) 143/56, pulse 64, temperature 97.8 F (36.6 C), temperature source Oral, resp. rate 18, height 5\' 5"  (1.651 m), weight 58.5 kg, SpO2 97 %.   General: No acute distress, sitting in bed, a little irritable this morning Heart: Regular rate and rhythm no rubs murmurs or extra sounds Lungs: Clear to auscultation, breathing unlabored, no rales or wheezes Abdomen: Positive bowel sounds, soft, very mildly tender to palpation throughout but no significant or focal tenderness and seems to resolve with distraction, nondistended, no rebound/guarding/rigidity Extremities: No clubbing, cyanosis, or edema Skin: No evidence of breakdown, no evidence of rash  PRIOR EXAM: Neurologic: Cranial nerves II through XII intact, motor strength is 5/5 in left  deltoid, bicep, tricep, grip, hip flexor, knee extensors, ankle dorsiflexor and plantar flexor 3- Right delt, biceps, 3- wrist and finger flexors and extensor 3- Right HF, KE, ADF  Musculoskeletal: Full range of motion in all 4 extremities. Right wrist drop no pain with ROM , no wrist effusion mild pain with sup/pronation, mild pain to palp over R ulnar  styloid    Assessment/Plan: 1. Functional deficits which require 3+ hours per day of interdisciplinary therapy in a comprehensive inpatient rehab setting. Physiatrist is providing close team supervision and 24 hour management of active medical problems listed below. Physiatrist and rehab team continue to assess barriers to discharge/monitor patient progress toward functional and medical goals  Care Tool:  Bathing    Body parts bathed by patient: Right arm, Face   Body parts bathed by helper: Buttocks, Left arm, Front perineal area, Right upper leg, Left upper leg, Abdomen, Chest, Left lower leg, Right lower leg     Bathing assist Assist Level: Total Assistance - Patient < 25%     Upper Body Dressing/Undressing Upper body dressing   What is the patient wearing?: Pull over shirt, Button up shirt    Upper body assist Assist Level: Maximal Assistance - Patient 25 - 49%    Lower Body Dressing/Undressing Lower body dressing      What is the patient wearing?: Pants, Underwear/pull up     Lower body assist Assist for lower body dressing: Total Assistance - Patient < 25%     Toileting Toileting Toileting Activity did not occur (Clothing management and hygiene only): N/A (no void or bm)  Toileting assist Assist for toileting: Total Assistance -  Patient < 25% (simulated)     Transfers Chair/bed transfer  Transfers assist     Chair/bed transfer assist level: Maximal Assistance - Patient 25 - 49%     Locomotion Ambulation   Ambulation assist   Ambulation activity did not occur: Safety/medical concerns          Walk 10 feet activity   Assist  Walk 10 feet activity did not occur: Safety/medical concerns        Walk 50 feet activity   Assist Walk 50 feet with 2 turns activity did not occur: Safety/medical concerns         Walk 150 feet activity   Assist Walk 150 feet activity did not occur: Safety/medical concerns         Walk 10 feet on  uneven surface  activity   Assist Walk 10 feet on uneven surfaces activity did not occur: Safety/medical concerns         Wheelchair     Assist Is the patient using a wheelchair?: Yes Type of Wheelchair: Manual    Wheelchair assist level: Dependent - Patient 0% Max wheelchair distance: 241ft    Wheelchair 50 feet with 2 turns activity    Assist        Assist Level: Dependent - Patient 0%   Wheelchair 150 feet activity     Assist      Assist Level: Dependent - Patient 0%   Blood pressure (!) 143/56, pulse 64, temperature 97.8 F (36.6 C), temperature source Oral, resp. rate 18, height 5\' 5"  (1.651 m), weight 58.5 kg, SpO2 97 %.  Medical Problem List and Plan: 1. 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  Antithrombotics: -DVT/anticoagulation:  Pharmaceutical: Eliquis according to pharmacy given age and GFR, will use 2.5mg  BID              -antiplatelet therapy: N/A 3. Pain Management: Tylenol as needed, muscle rub cream PRN -Hx of endstage OA of knees gets gel and cortisone injections last performed ~3wks ago per daughter 4. Mood/Behavior/Sleep: Namenda 10 mg twice daily, melatonin 3mg  QHS PRN             -antipsychotic agents: N/A  -03/30/22 sleeping well  5. Neuropsych/cognition: This patient is not capable of making decisions on her own behalf. Has moderate dementia- needs to be reminded everyday that she has suffered a stroke , appears surprised everyday  6. Skin/Wound Care: Routine skin checks 7. Fluids/Electrolytes/Nutrition: Routine in and outs with follow-up chemistries weekly, next 03/31/22  -Cont  Vit D3 400IU daily 8.  Atrial fibrillation with RVR LAA thrombus.  Continue Eliquis, Cardizem 360 mg daily.  Cardiac rate controlled 03/29/22 9.  UTI/Proteus Mirabilis.  Completed  course of Rocephin -03/23/22 family concerned with urine still being amber; encourage PO fluids, monitor for cloudiness or UTI symptoms/behavior changes -03/29/22 no mention of concerns of urine color or UTI symptoms, continue monitoring 10.  Hyperlipidemia.  Lipitor 80mg  QD 11.  Essential hypertension.  Continue Cardizem as directed  -123XX123 mild systolic  elevation - check ortho vitals   -03/29/22 BPs fairly stable/downtrending, continue monitoring  -03/30/22 BPs overall improving, monitor Vitals:   03/26/22 1423 03/26/22 1951 03/27/22 0533 03/27/22 1423  BP: (!) 157/67 (!) 155/77 (!) 153/64 (!) 152/76   03/27/22 2021 03/28/22 0549 03/28/22 1433 03/28/22 1941  BP: (!) 146/60 (!) 154/76 (!) 140/61 (!) 148/60   03/29/22 0535 03/29/22 1340 03/29/22 1955 03/30/22 0507  BP: 124/71 125/60 (!) 132/46 (!) 143/56    12.  Diabetes mellitus.  Diet controlled at baseline.  Hemoglobin A1c 5.6. 13.  Chronic bronchitis.  Continue Dulera and Incruse inhalers as indicated 14. Urinary incontinence: daughter wants purewick- I think due to her issues, that's reasonable and timed voiding q4 hours during day- since cannot get her OOB easily at night due to her stroke- hopefully will improve. -03/23/22 family with questions, discussed that purewick is likely temporary unless planning to get one at home; defer to weekday team regarding purewick duration 15. Constipation with incontinence- suggest Sorbitol once seen therapy, so can get her on [facility] -3/19- incont BM last noc takes Colace and Senokot at home; ordered Colace 100mg  QD, Senokot 1tab QD, and Sorbitol 81ml PRN, monitor -LBM 3/20 lg type 4 -Family reports blood in stool but not noted by Nsg , check stool OB and a CBC, has been constipated but with multiple BMs associated with laxative use   -03/29/22 LBM 1-2 days ago per family, but not documented on flow sheet; cont current regimen for now, but monitor closely. H/H stable yesterday, no further bloody stools.  -03/30/22 still no BM at time of rounds, increase Colace 100 mg BID, monitor for oversoftening  LOS: 10 days A FACE TO [place] 03/30/2022, 11:38 AM

## 2022-03-31 LAB — BASIC METABOLIC PANEL
Anion gap: 10 (ref 5–15)
BUN: 14 mg/dL (ref 8–23)
CO2: 21 mmol/L — ABNORMAL LOW (ref 22–32)
Calcium: 8.9 mg/dL (ref 8.9–10.3)
Chloride: 104 mmol/L (ref 98–111)
Creatinine, Ser: 0.78 mg/dL (ref 0.44–1.00)
GFR, Estimated: >60 mL/min (ref >60)
Glucose, Bld: 179 mg/dL — ABNORMAL HIGH (ref 70–99)
Potassium: 4.2 mmol/L (ref 3.5–5.1)
Sodium: 135 mmol/L (ref 135–145)

## 2022-03-31 LAB — CBC
HCT: 39.5 % (ref 36.0–46.0)
Hemoglobin: 13.3 g/dL (ref 12.0–15.0)
MCH: 32.7 pg (ref 26.0–34.0)
MCHC: 33.7 g/dL (ref 30.0–36.0)
MCV: 97.1 fL (ref 80.0–100.0)
Platelets: 232 10*3/uL (ref 150–400)
RBC: 4.07 MIL/uL (ref 3.87–5.11)
RDW: 14 % (ref 11.5–15.5)
WBC: 3.8 10*3/uL — ABNORMAL LOW (ref 4.0–10.5)
nRBC: 0 % (ref 0.0–0.2)

## 2022-03-31 NOTE — Progress Notes (Signed)
Physical Therapy Session Note  Patient Details  Name: Bridget [name] MRN: [id number] Date of Birth: 07/11/1921  Today's Date: 03/31/2022 PT Individual Time: 1002-1058 PT Individual Time Calculation (min): 56 min   Short Term Goals: Week 2:  PT Short Term Goal 1 (Week 2): STG=LTG due to ELOS  Skilled Therapeutic Interventions/Progress Updates:   Pt seated in [facility] with daughter Kennyth Lose) in room upon arrival. Daughter reports pt is fatigued today due to decreased sleep last night. Pt agreeable to therapy. Treatment session focused on improving functional transfers, weight bearing tolerance, standing tolerance, strength, and postural control. Pt transported to main gym dependent in [facility].   Pt performed 1x10 forward trunk leans, and  1x10 wheelchair pushups with tactile and verbal cuing for hand placement and approximation through R UE and B LE. Pt performed slide board transfer to WC<>mat table to R, with approximation through R hand, and +2 mod A.   Pt performed postural control exercises on EOM: R lateral trunk lean onto R elbow, and pushing up x10 with therapist providing verbal cues and joint approximation to R UE.   Pt performed lateral weight shifting to R LE, by reaching with L UE to suction toy off of mirror.   Pt performed STS x2 in // bars with max A, and +2 on standby; therapist utilized sheet around hips to facilitate hip extension, verbal and tactile cuing provided for upright posture, and glute activation. Pt able to stand for 30 seconds trial 1, and 1 min 30 seconds trial 2.   Pt transported dependent in [facility] to room. Pt seated in [facility] with all needs within reach, and pt's daughter in room at end of session.      Therapy Documentation Precautions:  Precautions Precautions: Fall Precaution Comments: watch HR, HOH, R-hemi, Restrictions Weight Bearing Restrictions: No    Therapy/Group: Individual Therapy  [facility] Alfredia Ferguson, [place], DPT  03/31/2022, 11:33 AM

## 2022-03-31 NOTE — Progress Notes (Signed)
PROGRESS NOTE   Subjective/Complaints:  No issues overnite, working with PT  ROS- limited by cognition  Objective:   No results found. Recent Labs    03/31/22 0754  WBC 3.8*  HGB 13.3  HCT 39.5  PLT 232    Recent Labs    03/31/22 0754  NA 135  K 4.2  CL 104  CO2 21*  GLUCOSE 179*  BUN 14  CREATININE 0.78  CALCIUM 8.9     Intake/Output Summary (Last 24 hours) at 03/31/2022 1028 Last data filed at 03/31/2022 [id number] Gross per 24 hour  Intake 595 ml  Output 825 ml  Net -230 ml         Physical Exam: Vital Signs Blood pressure (!) 143/89, pulse 70, temperature 98.1 F (36.7 C), temperature source Oral, resp. rate 18, height 5\' 5"  (1.651 m), weight 58.5 kg, SpO2 95 %.   General: No acute distress, sitting in bed, a little irritable this morning Heart: Regular rate and rhythm no rubs murmurs or extra sounds Lungs: Clear to auscultation, breathing unlabored, no rales or wheezes Abdomen: Positive bowel sounds, soft, very mildly tender to palpation throughout but no significant or focal tenderness and seems to resolve with distraction, nondistended, no rebound/guarding/rigidity Extremities: No clubbing, cyanosis, or edema Skin: No evidence of breakdown, no evidence of rash  PRIOR EXAM: Neurologic: Cranial nerves II through XII intact, motor strength is 5/5 in left  deltoid, bicep, tricep, grip, hip flexor, knee extensors, ankle dorsiflexor and plantar flexor 3- Right delt, biceps, 3- wrist and finger flexors and extensor 3- Right HF, KE, ADF  Musculoskeletal: Full range of motion in all 4 extremities. Right wrist drop no pain with ROM , no wrist effusion mild pain with sup/pronation, mild pain to palp over R ulnar styloid    Assessment/Plan: 1. Functional deficits which require 3+ hours per day of interdisciplinary therapy in a comprehensive inpatient rehab setting. Physiatrist is providing close team  supervision and 24 hour management of active medical problems listed below. Physiatrist and rehab team continue to assess barriers to discharge/monitor patient progress toward functional and medical goals  Care Tool:  Bathing    Body parts bathed by patient: Right arm, Face   Body parts bathed by helper: Buttocks, Left arm, Front perineal area, Right upper leg, Left upper leg, Abdomen, Chest, Left lower leg, Right lower leg     Bathing assist Assist Level: Total Assistance - Patient < 25%     Upper Body Dressing/Undressing Upper body dressing   What is the patient wearing?: Pull over shirt, Button up shirt    Upper body assist Assist Level: Maximal Assistance - Patient 25 - 49%    Lower Body Dressing/Undressing Lower body dressing      What is the patient wearing?: Pants, Underwear/pull up     Lower body assist Assist for lower body dressing: Total Assistance - Patient < 25%     Toileting Toileting Toileting Activity did not occur (Clothing management and hygiene only): N/A (no void or bm)  Toileting assist Assist for toileting: Total Assistance - Patient < 25% (simulated)     Transfers Chair/bed transfer  Transfers assist  Chair/bed transfer assist level: Maximal Assistance - Patient 25 - 49%     Locomotion Ambulation   Ambulation assist   Ambulation activity did not occur: Safety/medical concerns          Walk 10 feet activity   Assist  Walk 10 feet activity did not occur: Safety/medical concerns        Walk 50 feet activity   Assist Walk 50 feet with 2 turns activity did not occur: Safety/medical concerns         Walk 150 feet activity   Assist Walk 150 feet activity did not occur: Safety/medical concerns         Walk 10 feet on uneven surface  activity   Assist Walk 10 feet on uneven surfaces activity did not occur: Safety/medical concerns         Wheelchair     Assist Is the patient using a wheelchair?:  Yes Type of Wheelchair: Manual    Wheelchair assist level: Dependent - Patient 0% Max wheelchair distance: 284ft    Wheelchair 50 feet with 2 turns activity    Assist        Assist Level: Dependent - Patient 0%   Wheelchair 150 feet activity     Assist      Assist Level: Dependent - Patient 0%   Blood pressure (!) 143/89, pulse 70, temperature 98.1 F (36.7 C), temperature source Oral, resp. rate 18, height 5\' 5"  (1.651 m), weight 58.5 kg, SpO2 95 %.  Medical Problem List and Plan: 1. 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  Antithrombotics: -DVT/anticoagulation:  Pharmaceutical: Eliquis according to pharmacy given age and GFR, will use 2.5mg  BID              -antiplatelet therapy: N/A 3. Pain Management: Tylenol as needed, muscle rub cream PRN -Hx of endstage OA of knees gets gel and cortisone injections last performed ~3wks ago per daughter 4. Mood/Behavior/Sleep: Namenda 10 mg twice daily, melatonin 3mg  QHS PRN             -antipsychotic agents: N/A  -03/30/22 sleeping well  5. Neuropsych/cognition: This patient is not capable of making decisions on her own behalf. Has moderate dementia- needs to be reminded everyday that she has suffered a stroke , appears surprised everyday  6. Skin/Wound Care: Routine skin checks 7. Fluids/Electrolytes/Nutrition: Routine in and outs with follow-up chemistries weekly, next 03/31/22  -Cont Vit D3 400IU daily 8.  Atrial fibrillation with RVR LAA thrombus.  Continue Eliquis, Cardizem 360 mg daily.  Cardiac rate controlled 03/29/22 9.  UTI/Proteus Mirabilis.  Completed  course of  Rocephin -03/23/22 family concerned with urine still being amber; encourage PO fluids, monitor for cloudiness or UTI symptoms/behavior changes -03/29/22 no mention of concerns of urine color or UTI symptoms, continue monitoring 10.  Hyperlipidemia.  Lipitor 80mg  QD 11.  Essential hypertension.  Continue Cardizem as directed  -fair control 3/25 Vitals:   03/27/22 1423 03/27/22 2021 03/28/22 0549 03/28/22 1433  BP: (!) 152/76 (!) 146/60 Marland Kitchen)  154/76 (!) 140/61   03/28/22 1941 03/29/22 0535 03/29/22 1340 03/29/22 1955  BP: (!) 148/60 124/71 125/60 (!) 132/46   03/30/22 0507 03/30/22 1417 03/30/22 1910 03/31/22 0433  BP: (!) 143/56 116/64 133/89 (!) 143/89    12.  Diabetes mellitus.  Diet controlled at baseline.  Hemoglobin A1c 5.6. 13.  Chronic bronchitis.  Continue Dulera and Incruse inhalers as indicated 14. Urinary incontinence: reassess purewick use in care team  15. Constipation with incontinence- suggest Sorbitol once seen therapy, so can get her on [facility] -3/19- incont BM last noc takes Colace and Senokot at home; ordered Colace 100mg  QD, Senokot 1tab QD, and Sorbitol 104ml PRN, monitor   LOS: 11 days A FACE TO FACE EVALUATION WAS PERFORMED  Charlett Blake 03/31/2022, 10:28 AM

## 2022-03-31 NOTE — Progress Notes (Signed)
Occupational Therapy Session Note  Patient Details  Name: Bridget [name] MRN: [id number] Date of Birth: 18-Mar-1921  Today's Date: 03/31/2022 OT Individual Time: [id number] OT Individual Time Calculation (min): 62 min    Short Term Goals: Week 2:  OT Short Term Goal 1 (Week 2): STGs = LTGs  Skilled Therapeutic Interventions/Progress Updates:  Pt greeted supine in bed with granddaughter and NT present, pt wet from purewick leaking per granddaughter. Assisted Nt with pericare and changing brief with total A. Did work on initiating rolling with education provided to granddaughter on technique for rolling in bed.  Completed bed bath with total A with pt declining to assist washing any parts. Donned all clothes from bed level with total A.  Pt completed supine>sit with MODA, education provided to daughter and granddaughter on bed mobility maneuver with attempting to get pt to complete as much as possible. Pt completed SB transfer to w/c to L side with overall MOD A +2 to hold board. Pt with decreased ability to initiate scooting today needing MAX cues for technique.  Transported pt to gym with total A, had granddaughter and daughter practice SB transfers to [facility] with family needing max cues for technique and set- up. Education provided that if family can initiate initial scoot pt usually does well completing incremental scoots towards sitting surface.  Ended session with pt seated in w/c in the care of her family.   Therapy Documentation Precautions:  Precautions Precautions: Fall Precaution Comments: watch HR, HOH, R-hemi, Restrictions Weight Bearing Restrictions: No   Pain: general pain with all mobility tasks, rest breaks provided as needed.     Therapy/Group: Individual Therapy  Precious Haws 03/31/2022, 12:10 PM

## 2022-03-31 NOTE — Progress Notes (Signed)
Occupational Therapy Session Note  Patient Details  Name: Bridget [name] MRN: [id number] Date of Birth: 01/22/1921  Today's Date: 03/31/2022 OT Individual Time: 1410-1440 OT Individual Time Calculation (min): 30 min  and Today's Date: 03/31/2022 OT Missed Time: 15 Minutes Missed Time Reason: Other (comment) (pt was confused (not oriented) and getting agitated with engagement in therapy)   Short Term Goals: Week 1:  OT Short Term Goal 1 (Week 1): Pt will feed self with min A ~50% of meal using adaptive strategies and AD PRN OT Short Term Goal 1 - Progress (Week 1): Met OT Short Term Goal 2 (Week 1): Pt will complete squat pivot transfers with mod A consistently OT Short Term Goal 2 - Progress (Week 1): Not progressing OT Short Term Goal 3 (Week 1): Pt will complete LB dressing mod A OT Short Term Goal 3 - Progress (Week 1): Not progressing Week 2:  OT Short Term Goal 1 (Week 2): STGs = LTGs  Skilled Therapeutic Interventions/Progress Updates:     Pt received in w/c with daughter in the room. Daughter stated that the stedy transfer did not go well yesterday with the nursing students helping her as pt was not well positioned in the stedy.  The sliding board transfers continue to require max A and daughter is unsure of how easily she can get her to [facility] unless her granddaughter is also present. Discussed how to handle toileting needs at home if daughter is alone and needs to A her mom.   If pt is in w/c, she can use slide board to bed and then move to supine for use of a bed pan.  Started to practice this with the patient but pt got upset about the gait belt misinterpreting its use and pt began talking about a wedding she needed to get to.   Initially pt stated she was getting married, then stated her son was getting married.   Suggested to dtr, that I show her how to do the transfer with dtr in the role of the patient.  Dtr began the slide board practice, but pt got upset again thinking  inappropriate actions were taking place. Unable to reorient the patient.    Reviewed steps with daughter but she will need to practice at a time her mom is able to participate.  Provided pt with the 2 versions of the bed pan for her to try tonight. Spoke with her NT to include daughter during bed pan use so she can practice placement and removal of pan.  Daughter also commented that the extended hospital stay seems to be causing more confusion.  She expressed that going home sooner may be best as pt has not been able to progress.  Agreed with her and told her to talk with MD in the morning.   Daughter took pt out of the room to go outside as pt kept perseverating on getting to this "wedding".   Therapy Documentation Precautions:  Precautions Precautions: Fall Precaution Comments: watch HR, HOH, R-hemi, Restrictions Weight Bearing Restrictions: No    Vital Signs: Oxygen Therapy SpO2: 95 % O2 Device: Room Air  Pain: no c/o pain    ADL: ADL Eating: Minimal assistance, Minimal cueing Where Assessed-Eating: Bed level Grooming: Moderate assistance Where Assessed-Grooming: Edge of bed Upper Body Bathing: Moderate assistance Where Assessed-Upper Body Bathing: Edge of bed Lower Body Bathing: Maximal assistance Where Assessed-Lower Body Bathing: Edge of bed Upper Body Dressing: Maximal assistance Where Assessed-Upper Body Dressing: Edge of bed Lower  Body Dressing: Dependent, Maximal assistance Where Assessed-Lower Body Dressing: Edge of bed Toileting: Maximal assistance, Dependent (simulated) Where Assessed-Toileting: Bedside Commode Toilet Transfer: Dependent Toilet Transfer Method:  (+2 stabilizing wc for safety) Toilet Transfer Equipment: Engineer, technical sales Transfer: Not assessed Social research officer, government: Dependent (simulated) Social research officer, government Method: Education officer, environmental: Shower seat with back   Therapy/Group: Individual  Therapy  Dynisha Due 03/31/2022, 9:58 AM

## 2022-03-31 NOTE — Progress Notes (Signed)
Patient ID: Bridget [name], female   DOB: [birth year], 89 y.o.   MRN: [id number]  Transport chair, hospital bed, bari drop arm commode ordered through [employer].

## 2022-03-31 NOTE — Progress Notes (Signed)
Physical Therapy Session Note  Patient Details  Name: Bridget [name] MRN: [id number] Date of Birth: 06-15-1921  Today's Date: 03/31/2022 PT Individual Time: 0105-0154 PT Individual Time Calculation (min): 49 min   Short Term Goals: Week 1:  PT Short Term Goal 1 (Week 1): Pt will perform transfers w/ Mod A using LRAD PT Short Term Goal 1 - Progress (Week 1): Not met PT Short Term Goal 2 (Week 1): Pt will tolerate standing for >2 minutes PT Short Term Goal 2 - Progress (Week 1): Not met PT Short Term Goal 3 (Week 1): Pt will intiate gait of at east 5 feet PT Short Term Goal 3 - Progress (Week 1): Not met  Skilled Therapeutic Interventions/Progress Updates:    Pt received sitting in chair w/ daughter present. Pt agreeable to PT services. Focus of today's session was on slide board transfers and bed mobility w/ pt's daughter/caregiver. Pt performed slide board transfer to the L from [place] to EOB w/ Mod A+3 w/ daughter included. Pt's daughter reminded of proper slide board set-up and hand placement. Pt required mod vc to initiate movement and maintain on task. Seated at EOB pt instructed to lay down while pt's daughter managed LE W/ Mod A. With pt in supine, worked on rolling in bed w/ daughter to prepare for d/c home. Pt rolled to the R w/ Mod A +1 using L hand to help pull her over. Min A provided to place ipsilateral foot in hooklying to push through foot to assist in roll. Rolling to the L was more challenging and required Mod A +2 to perform and would not maintain hooklying position to assist in mobility. Therapist showed pt's daughter how to use chuck pad to facilitate mobility. Performed supine<>sit at EOB w/ Mod A +2 to manage upper trunk and LE. Pt's daughter assisted in R slide board transfer from EOB back to [facility] w/ Mod A +3. Mod verbal cueing for pt to lean head to the R and use RUE to pull herself across the slide board sliding towards her hand. Pt demonstrated little participation. Pt left sitting  in WC in the care of daughter w/ all needs met.   Therapy Documentation Precautions:  Precautions Precautions: Fall Precaution Comments: watch HR, HOH, R-hemi, Restrictions Weight Bearing Restrictions: No General:  Pain: pt denied any pain        Therapy/Group: Individual Therapy  Traves Majchrzak 03/31/2022, 4:09 PM

## 2022-03-31 NOTE — Discharge Summary (Signed)
Physician Discharge Summary  Patient ID: Bridget [name] MRN: [id number] DOB/AGE: Feb 04, 1921 89 y.o.  Admit date: 03/20/2022 Discharge date: 04/05/2022  Discharge Diagnoses:  Principal Problem:   Ischemic stroke of frontal lobe [facility]) Active Problems:   Embolic cerebral infarction [facility]) DVT prophylaxis Mood stabilization Atrial fibrillation with RVR UTI/Proteus UTI/chronic urinary frequency Hypertension Hyperlipidemia Diabetes mellitus Chronic bronchitis Constipation History of breast cancer  Discharged Condition: Stable  Significant Diagnostic Studies: ECHOCARDIOGRAM COMPLETE BUBBLE STUDY  Result Date: 03/16/2022    ECHOCARDIOGRAM REPORT   Patient Name:   Bridget [name] Date of Exam: 03/16/2022 Medical Rec #:  [id number]    Height:       67.0 in Accession #:    [id number]   Weight:       138.4 lb Date of Birth:  1921/11/06    BSA:          1.730 m Patient Age:    12 years     BP:           159/83 mmHg Patient Gender: F            HR:           102 bpm. Exam Location:  Inpatient Procedure: 2D Echo, Cardiac Doppler and Color Doppler Indications:    Stroke 434.91 / I63.9  History:        Patient has prior history of Echocardiogram examinations, most                 recent 09/20/2020. Stroke; Risk Factors:Former Smoker.  Sonographer:    Wilkie Aye RVT RCS Referring Phys: [id number] [place]  1. Left ventricular ejection fraction, by estimation, is 55 to 60%. The left ventricle has normal function. The left ventricle has no regional wall motion abnormalities. Left ventricular diastolic parameters are indeterminate.  2. Right ventricular systolic function is normal. The right ventricular size is mildly enlarged. There is mildly elevated pulmonary artery systolic pressure.  3. Left atrial size was severely dilated.  4. Right atrial size was severely dilated.  5. The mitral valve is abnormal. Mild mitral valve regurgitation. No evidence of mitral stenosis.  6. The tricuspid valve is  abnormal. Tricuspid valve regurgitation is moderate to severe.  7. The aortic valve was not well visualized. There is severe calcifcation of the aortic valve. There is severe thickening of the aortic valve. Aortic valve regurgitation is moderate. No aortic stenosis is present.  8. Agitated saline contrast bubble study was negative, with no evidence of any interatrial shunt. FINDINGS  Left Ventricle: Left ventricular ejection fraction, by estimation, is 55 to 60%. The left ventricle has normal function. The left ventricle has no regional wall motion abnormalities. The left ventricular internal cavity size was normal in size. There is  no left ventricular hypertrophy. Left ventricular diastolic parameters are indeterminate. Right Ventricle: The right ventricular size is mildly enlarged. Right vetricular wall thickness was not well visualized. Right ventricular systolic function is normal. There is mildly elevated pulmonary artery systolic pressure. The tricuspid regurgitant  velocity is 2.97 m/s, and with an assumed right atrial pressure of 3 mmHg, the estimated right ventricular systolic pressure is 0000000 mmHg. Left Atrium: Left atrial size was severely dilated. Right Atrium: Right atrial size was severely dilated. Pericardium: There is no evidence of pericardial effusion. Mitral Valve: The mitral valve is abnormal. Mild mitral valve regurgitation. No evidence of mitral valve stenosis. Tricuspid Valve: The tricuspid valve is abnormal. Tricuspid valve regurgitation is moderate to severe.  No evidence of tricuspid stenosis. Aortic Valve: The aortic valve was not well visualized. There is severe calcifcation of the aortic valve. There is severe thickening of the aortic valve. There is severe aortic valve annular calcification. Aortic valve regurgitation is moderate. No aortic stenosis is present. Aortic valve mean gradient measures 8.0 mmHg. Aortic valve peak gradient measures 14.1 mmHg. Aortic valve area, by VTI measures  2.50 cm. Pulmonic Valve: The pulmonic valve was not well visualized. Pulmonic valve regurgitation is not visualized. No evidence of pulmonic stenosis. Aorta: The aortic root is normal in size and structure. IAS/Shunts: No atrial level shunt detected by color flow Doppler. Agitated saline contrast bubble study was negative, with no evidence of any interatrial shunt.  LEFT VENTRICLE PLAX 2D LVIDd:         4.30 cm   Diastology LVIDs:         3.90 cm   LV e' medial:    6.50 cm/s LV PW:         1.05 cm   LV E/e' medial:  17.6 LV IVS:        1.05 cm   LV e' lateral:   11.00 cm/s LVOT diam:     2.20 cm   LV E/e' lateral: 10.4 LV SV:         67 LV SV Index:   39 LVOT Area:     3.80 cm  RIGHT VENTRICLE            IVC RV Basal diam:  4.20 cm    IVC diam: 2.10 cm RV S prime:     7.62 cm/s TAPSE (M-mode): 1.5 cm LEFT ATRIUM              Index        RIGHT ATRIUM           Index LA diam:        4.30 cm  2.49 cm/m   RA Area:     33.60 cm LA Vol (A2C):   127.0 ml 73.43 ml/m  RA Volume:   117.00 ml 67.65 ml/m LA Vol (A4C):   111.0 ml 64.18 ml/m LA Biplane Vol: 120.0 ml 69.38 ml/m  AORTIC VALVE AV Area (Vmax):    2.64 cm AV Area (Vmean):   2.23 cm AV Area (VTI):     2.50 cm AV Vmax:           188.00 cm/s AV Vmean:          132.000 cm/s AV VTI:            0.269 m AV Peak Grad:      14.1 mmHg AV Mean Grad:      8.0 mmHg LVOT Vmax:         130.71 cm/s LVOT Vmean:        77.391 cm/s LVOT VTI:          0.177 m LVOT/AV VTI ratio: 0.66 AR Vena Contracta: 0.40 cm  AORTA Ao Root diam: 2.70 cm MITRAL VALVE                TRICUSPID VALVE MV Area (PHT): 3.94 cm     TR Peak grad:   35.3 mmHg MV Decel Time: 193 msec     TR Vmax:        297.00 cm/s MV E velocity: 114.67 cm/s  SHUNTS                             Systemic VTI:  0.18 m                             Systemic Diam: 2.20 cm Carlyle Dolly MD Electronically signed by Carlyle Dolly MD Signature Date/Time: 03/16/2022/6:55:53 PM    Final    CT ABDOMEN  PELVIS W CONTRAST  Result Date: 03/16/2022 CLINICAL DATA:  Abdominal pain. EXAM: CT ABDOMEN AND PELVIS WITH CONTRAST TECHNIQUE: Multidetector CT imaging of the abdomen and pelvis was performed using the standard protocol following bolus administration of intravenous contrast. RADIATION DOSE REDUCTION: This exam was performed according to the departmental dose-optimization program which includes automated exposure control, adjustment of the mA and/or kV according to patient size and/or use of iterative reconstruction technique. CONTRAST:  41mL OMNIPAQUE IOHEXOL 350 MG/ML SOLN COMPARISON:  None Available. FINDINGS: Evaluation of this exam is limited due to respiratory motion as well as loose stool streak artifact caused by patient's arms. Lower chest: There are bibasilar interstitial coarsening. There is moderate cardiomegaly. Three vessel coronary vascular calcification. No intra-abdominal free air for free fluid. Hepatobiliary: Several small liver cysts. The liver is otherwise unremarkable. No biliary ductal dilatation. The gallbladder is unremarkable. Pancreas: The pancreas is unremarkable as visualized. Spleen: Normal in size without focal abnormality. Adrenals/Urinary Tract: The adrenal glands unremarkable. There is no hydronephrosis on either side. There is symmetric enhancement and excretion of contrast by both kidneys. The visualized ureters and urinary bladder appear unremarkable. Stomach/Bowel: There is moderate stool throughout the colon. There are scattered sigmoid diverticula without active inflammatory changes. There is no bowel obstruction or active inflammation. The appendix is not visualized with certainty. No inflammatory changes identified in the right lower quadrant. Vascular/Lymphatic: The aorta is tortuous. There is advanced aortoiliac atherosclerotic disease. An infrarenal IVC filter is noted. No portal venous gas. There is no adenopathy. Reproductive: The uterus is not visualized, likely  surgically absent. There is a 3.2 cm right ovarian cyst. Soft tissue associated with the cyst likely represent the ovarian tissue. This can be better evaluated with ultrasound on a nonemergent/outpatient basis. The left ovary is not identified with certainty. Other: None Musculoskeletal: Osteopenia with degenerative changes of the spine. Bilateral femoral neck ORIF. No acute osseous pathology. IMPRESSION: 1. No acute intra-abdominal or pelvic pathology. 2. Constipation.  No bowel obstruction. 3. Sigmoid diverticulosis. 4. A 3.2 cm right ovarian cyst. This can be better evaluated with ultrasound on a nonemergent/outpatient basis. 5.  Aortic Atherosclerosis (ICD10-I70.0). Electronically Signed   By: Anner Crete M.D.   On: 03/16/2022 01:17   MR BRAIN WO CONTRAST  Result Date: 03/16/2022 CLINICAL DATA:  Neuro deficit, stroke suspected EXAM: MRI HEAD WITHOUT CONTRAST TECHNIQUE: Multiplanar, multiecho pulse sequences of the brain and surrounding structures were obtained without intravenous contrast. COMPARISON:  No prior MRI available, correlation is made with CT head 03/15/2022 FINDINGS: Brain: Restricted diffusion with ADC correlate in the left posterior frontal cortex, including the precentral gyrus (series 5, images 82-87), consistent with acute infarcts. This areas not associated with significantly increased T2 hyperintense signal. Additional smaller foci of mildly hyperintense signal on diffusion-weighted imaging without definite ADC correlate in the more anterior left frontal lobe may represent more subacute infarcts (series 5, images 82-83). No acute hemorrhage, mass, mass effect, or midline shift. No hydrocephalus or extra-axial collection.  Encephalomalacia in the right greater than left frontal lobes, consistent with remote infarcts. Age related cerebral atrophy, with slightly more pronounced atrophy in the bilateral medial temporal lobes. T2 hyperintense signal in the periventricular white matter,  likely the sequela of moderate chronic small vessel ischemic disease. Vascular: Normal arterial flow voids. Skull and upper cervical spine: Normal marrow signal. Sinuses/Orbits: Clear paranasal sinuses. Status post bilateral lens replacements. Other: Fluid in the mastoid air cells. IMPRESSION: Acute infarcts in the left posterior frontal cortex, including the precentral gyrus. Additional smaller subacute infarcts in the more anterior left frontal lobe. Imaging results were communicated on 03/16/2022 at 12:43 am to provider Dr. Rory Percy via secure text paging. Electronically Signed   By: Merilyn Baba M.D.   On: 03/16/2022 00:47   DG Chest Port 1 View  Result Date: 03/15/2022 CLINICAL DATA:  Screening EXAM: PORTABLE CHEST 1 VIEW COMPARISON:  Radiographs 01/16/2022 FINDINGS: No change from 01/16/2022. Airspace opacity in the right lower lung may represent chronic middle lobe collapse as there is slight rightward deviation of the mediastinum. Cardiomegaly. Aortic atherosclerotic calcification. Chronic bronchitic change. No displaced rib fractures. IMPRESSION: No change from 01/16/2022. Airspace opacity in the right lower lung may represent chronic middle lobe collapse as there is slight rightward deviation of the mediastinum. Electronically Signed   By: Placido Sou M.D.   On: 03/15/2022 23:54   CT ANGIO HEAD NECK W WO CM (CODE STROKE)  Result Date: 03/15/2022 CLINICAL DATA:  Stroke suspected EXAM: CT ANGIOGRAPHY HEAD AND NECK TECHNIQUE: Multidetector CT imaging of the head and neck was performed using the standard protocol during bolus administration of intravenous contrast. Multiplanar CT image reconstructions and MIPs were obtained to evaluate the vascular anatomy. Carotid stenosis measurements (when applicable) are obtained utilizing NASCET criteria, using the distal internal carotid diameter as the denominator. RADIATION DOSE REDUCTION: This exam was performed according to the departmental dose-optimization  program which includes automated exposure control, adjustment of the mA and/or kV according to patient size and/or use of iterative reconstruction technique. CONTRAST:  33mL OMNIPAQUE IOHEXOL 350 MG/ML SOLN COMPARISON:  No prior CTA available, correlation is made with CT head 03/15/2022 FINDINGS: CT HEAD FINDINGS For noncontrast findings, please see same day CT head. CTA NECK FINDINGS Aortic arch: Incompletely imaged, but standard aortic branching is suspected. Imaged portion shows no evidence of aneurysm or dissection. No significant stenosis of the major arch vessel origins. Right carotid system: No evidence of dissection, occlusion, or hemodynamically significant stenosis (greater than 50%). Atherosclerotic disease at the bifurcation and in the proximal ICA is not hemodynamically significant. Tortuous distal ICAs. Left carotid system: No evidence of dissection, occlusion, or hemodynamically significant stenosis (greater than 50%). Atherosclerotic disease at the bifurcation and in the proximal ICA is not hemodynamically significant. Vertebral arteries: No evidence of dissection, occlusion, or hemodynamically significant stenosis (greater than 50%). The left vertebral artery is diminutive throughout its course. Skeleton: No acute osseous abnormality. Severe degenerative changes in the cervical spine, with reversal of the normal cervical lordosis. Other neck: Negative. Upper chest: No focal pulmonary opacity or pleural effusion. Review of the MIP images confirms the above findings CTA HEAD FINDINGS Anterior circulation: Both internal carotid arteries are patent to the termini, with mild stenosis in the bilateral supraclinoid segments. Patent right A1. Severely hypoplastic left A1. Normal anterior communicating artery. Severe stenosis in the left A2 segment (series 7, images 70-72). Anterior cerebral arteries are otherwise patent to their distal aspects. No M1 stenosis or occlusion. MCA branches perfused and  symmetric. Posterior circulation: The left vertebral artery terminates in PICA. The right vertebral artery is patent to the vertebrobasilar junction without stenosis. Posterior inferior cerebellar arteries patent proximally. Basilar patent to its distal aspect. Superior cerebellar arteries patent proximally. Patent P1 segments. PCAs perfused to their distal aspects without stenosis. The bilateral posterior communicating arteries are not visualized. Venous sinuses: As permitted by contrast timing, patent. Anatomic variants: None significant. Review of the MIP images confirms the above findings IMPRESSION: 1. No intracranial large vessel occlusion. Severe stenosis in the left A2 segment. 2. No hemodynamically significant stenosis in the neck. Imaging results were communicated on 03/15/2022 at 11:26 pm to provider Dr. Rory Percy via secure text paging. Electronically Signed   By: Merilyn Baba M.D.   On: 03/15/2022 23:27   CT HEAD CODE STROKE WO CONTRAST  Result Date: 03/15/2022 CLINICAL DATA:  Code stroke. EXAM: CT HEAD WITHOUT CONTRAST TECHNIQUE: Contiguous axial images were obtained from the base of the skull through the vertex without intravenous contrast. RADIATION DOSE REDUCTION: This exam was performed according to the departmental dose-optimization program which includes automated exposure control, adjustment of the mA and/or kV according to patient size and/or use of iterative reconstruction technique. COMPARISON:  03/22/2020 FINDINGS: Brain: No evidence of acute infarction, hemorrhage, mass, mass effect, or midline shift. No hydrocephalus or extra-axial collection. Redemonstrated right greater than left frontal cortical infarcts. Periventricular white matter changes, likely the sequela of chronic small vessel ischemic disease. Vascular: No hyperdense vessel. Atherosclerotic calcifications in the intracranial carotid and vertebral arteries. Skull: Negative for fracture or focal lesion. Sinuses/Orbits: No acute  finding. Status post bilateral lens replacements. Other: The mastoid air cells are well aerated. ASPECTS [facility] Stroke Program Early CT Score) - Ganglionic level infarction (caudate, lentiform nuclei, internal capsule, insula, M1-M3 cortex): 7 - Supraganglionic infarction (M4-M6 cortex): 3 Total score (0-10 with 10 being normal): 10 IMPRESSION: 1. No acute intracranial process. 2. ASPECTS is 10. Imaging results were communicated on 03/15/2022 at 10:30 pm to provider Dr. Rory Percy via secure text paging. Electronically Signed   By: Merilyn Baba M.D.   On: 03/15/2022 22:30    Labs:  Basic Metabolic Panel: Recent Labs  Lab 03/31/22 0754  NA 135  K 4.2  CL 104  CO2 21*  GLUCOSE 179*  BUN 14  CREATININE 0.78  CALCIUM 8.9    CBC: Recent Labs  Lab 03/28/22 0914 03/31/22 0754  WBC 4.1 3.8*  HGB 13.2 13.3  HCT 40.0 39.5  MCV 100.0 97.1  PLT 223 232    CBG: No results for input(s): "GLUCAP" in the last 168 hours.  Family history.  Positive for hypertension as well as hyperlipidemia.  Denies any colon cancer esophageal cancer or rectal cancer  Brief HPI:   Bridget [name] is a 89 y.o. right-handed female with history of moderate dementia maintained on Namenda requiring 24/7 assistance, diet-controlled diabetes mellitus atrial fibrillation maintained on Eliquis as well as history of CVA with no residual weakness, history of breast cancer with lumpectomy.  Per chart review lives with granddaughter and 24-hour assistance.  Used a rollator at baseline for mobility.  Presented 03/15/2022 with right-sided facial droop and slurred speech of acute onset.  CT/MRI showed acute infarction in the left posterior frontal cortex including the precentral gyrus.  Additional smaller subacute infarcts in the more anterior left frontal lobe.  Patient did not receive tPA and maintained on Eliquis.  CT angiogram head and neck with no intracranial large vessel occlusion.  No significant stenosis  in the neck.  CT of the  abdomen and pelvis due to abdominal pain showed no acute abdominal pathology.  Admission chemistries unremarkable except sodium 130 urine culture 100,000 Proteus.  Echocardiogram ejection fraction of 55 to 60% no wall motion abnormalities.  Neurology follow-up remained on Eliquis as prior to admission.  Rocephin added for UTI.  Therapy evaluations completed due to patient decreased functional mobility and dysarthria was admitted for a comprehensive rehab program.   Hospital Course: Bridget [name] was admitted to rehab 03/20/2022 for inpatient therapies to consist of PT, ST and OT at least three hours five days a week. Past admission physiatrist, therapy team and rehab RN have worked together to provide customized collaborative inpatient rehab.  Pertaining to patient's left precentral gyrus and left posterior frontal lobe infarction likely embolic in the setting of atrial fibrillation.  Patient remain on Eliquis as prior to admission and would follow-up neurology services.  Namenda ongoing for history of dementia with mood stabilization she was using some melatonin as needed for sleep.  History of atrial fibrillation with RVR Cardizem as directed with rate controlled follow-up outpatient.  She completed course of Rocephin for UTI/Proteus no dysuria but she did have some frequency and remained on prophylactic Macrodantin as prior to admission.  Lipitor for hyperlipidemia.  Diet controlled diabetes mellitus hemoglobin A1c 5.6.  Chronic bronchitis with inhalers as directed.  Bouts of constipation resolved with laxative assistance.   Blood pressures were monitored on TID basis and soft and monitored  Diabetes has been monitored with ac/hs CBG checks and SSI was use prn for tighter BS control.    Rehab course: During patient's stay in rehab weekly team conferences were held to monitor patient's progress, set goals and discuss barriers to discharge. At admission, patient required minimal assist upper body bathing  max assist lower body bathing max assist toilet transfers.  Max assist sit to supine  Physical exam.  Blood pressure 119/71 pulse 61 temperature 98 respirations 13 oxygen saturation is 94% room air Constitutional.  No acute distress HEENT.  Mild right facial droop tongue midline Eyes.  Pupils round and reactive to light no discharge without nystagmus Neck.  Supple nontender no JVD without thyromegaly Cardiac regular rate and rhythm without any extra sounds or murmur heard Abdomen.  Soft nontender positive bowel sounds without rebound Respiratory effort normal no respiratory distress without wheeze Musculoskeletal.  Right upper extremity biceps and triceps 4/5 grip and FA 2/5 could not get patient to participate to do WE Left upper extremity 4/5 except FA 3+/5 Right lower extremity hip flexors 2/5 KE/KF 2/5 DF 4 -/5 and PF 4 -/5 Left lower extremity 4+/5 and same muscles tested  He/She  has had improvement in activity tolerance, balance, postural control as well as ability to compensate for deficits. He/She has had improvement in functional use RUE/LUE  and RLE/LLE as well as improvement in awareness.  Perform slide board transfers with moderate assist of 2.  Working with energy conservation techniques.  Gait has not been initiated due to patient's intolerance to standing.  Patient donned pants while supine in bed perform right left rolling with max assist.  Doffed night time shirt and donned but not sure while sitting edge of bed.  Full family teaching completed plan discharge to home       Disposition: Discharge to home    Diet: Regular  Special Instructions: No driving smoking or alcohol  Medications at discharge 1.  Tylenol as needed 2.  Eliquis 2.5  mg p.o. twice daily 3.  Lipitor 80 mg p.o. daily 4.  Vitamin D 400 units p.o. daily 5.  Cardizem 360 mg p.o. daily 6.  Colace 100 mg p.o. twice daily hold for loose stools 7.  Namenda 10 mg p.o. twice daily 8.  Breztri Aerosphere  160 - 9 - 4.8 mcg 2 puffs into the lungs in the morning and bedtime 9.  Ellipta 62.5 mcg 1 puff daily 10.  Macrodantin 50 mg p.o. nightly 11.  Melatonin 3 mg nightly as needed 12.  Prolia 60 mg every 6 months 13.  Ventolin inhaler 2 puffs every 6 hours as needed shortness of breath  30-35 minutes were spent completing discharge summary and discharge planning  Discharge Instructions     Ambulatory referral to Neurology   Complete by: As directed    An appointment is requested in approximately: 4 weeks left precentral gyrus/left posterior frontal lobe infarction   Ambulatory referral to Physical Medicine Rehab   Complete by: As directed    Moderate complexity follow-up 1 to 2 weeks left precentral gyrus/left posterior frontal lobe CVA        Follow-up Information     Kirsteins, Luanna Salk, MD Follow up.   Specialty: Physical Medicine and Rehabilitation Why: Office to call for appointment Contact information: [place] [address] [phone]         Rex Kras, DO Follow up.   Specialties: Cardiology, Vascular Surgery Why: Call for appointment Contact information: [address] [place] [address] [phone]                 Signed: Cathlyn Parsons 04/04/2022, 4:32 AM

## 2022-04-01 NOTE — Progress Notes (Addendum)
Patient ID: Bridget [name], female   DOB: 03/30/1921, 89 y.o.   MRN: [id number]  Sw spoke to granddaughter, Daneen Schick. Confirmed all DME ordered and Confirmed [facility]. Granddaughter assisting her mother with all HH and DME tasks so her mother is able to focus on education. Family inquiring about a Chief Strategy Officer wheelchair. No additional questions or concerns.

## 2022-04-01 NOTE — Progress Notes (Signed)
Patient ID: Bridget [name], female   DOB: [birth year], 89 y.o.   MRN: [id number]  Harrel Lemon ordered through [employer], per family request.

## 2022-04-01 NOTE — Progress Notes (Signed)
Occupational Therapy Session Note  Patient Details  Name: Bridget [name] MRN: [id number] Date of Birth: 11/05/1921  Today's Date: 04/01/2022 OT Individual Time: [id number] session 1 OT Individual Time Calculation (min): 71 min  Session 2: 1452-1547   Short Term Goals: Week 2:  OT Short Term Goal 1 (Week 2): STGs = LTGs  Skilled Therapeutic Interventions/Progress Updates:  Session 1: Pt greeted supine in bed with daughter present, pt agreeable to OT intervention. Session focused on family ed in relation to ADLs and functional mobility. Daughter reports she wants to switch gears to [employer] lift transfers as she feels SB transfers wont work for them. Pt required total A for all aspects of dressing from bed level. Daughter reports feeling confident in completing dressing from bed level.   Retrieved hoyer lift and provided education to daughter on hoyer transfers with NT assisting therapist with transfer. Pt c/o of discomfort during most aspects of hoyer lift however overall pt tolerated transfer well. Plan to continue remainder of sessions today with a focus on hoyer transfers. Messaged care team about change in functional mobility method.                Ended session with pt seated in w/c with all needs within reach and in the care of her daughter.   Session 2: Pt greeted seated in w/c with daughter present, pt agreeable to OT intervention. Session focus on continued family ed with daughter on hoyer lift transfers. Transported pt to gym to practice w/c>mat table, daughter able to don sling behind pt from w/c with MIN cues for technique. Daughter continues to need MAX cues about lift functions such as hydraulic lift/lower, daughter was able to recall correct position of straps on sling with no cues. Daughter directed transfer and able to lift pt from w/c >mat table with increased time and MIN cues for needing to turn pt in lift in order to lower pt on mat table in correct orientation.  Once on mat table  in supine, daughter practiced bed mobility skills I.e rolling pt R<>L to don/doff sling. Daughter completed mat table>w/c transfer with lift with daughter again needing cues to orient pt in the correct direction once in sling in order to transport back to w/c.   Did update pts safety plan to reflect maxi move transfers only and bed level toileting.                   Ended session with pt seated in w/c in the care of of her daughter.                Therapy Documentation Precautions:  Precautions Precautions: Fall Precaution Comments: watch HR, HOH, R-hemi, Restrictions Weight Bearing Restrictions: No  Pain: General aches and pains reported during both session in relation to hoyer transfers, able to redirect pt and provide rest breaks/repositioning as needed.    Therapy/Group: Individual Therapy  Corinne Ports [facility] 04/01/2022, 12:08 PM

## 2022-04-01 NOTE — Progress Notes (Signed)
Physical Therapy Session Note  Patient Details  Name: Bridget [name] MRN: [id number] Date of Birth: 28-May-1921  Today's Date: 04/01/2022 PT Individual Time: [id number] + 1302-1407 PT Individual Time Calculation (min): 49 min + 65 min  Short Term Goals: Week 2:  PT Short Term Goal 1 (Week 2): STG=LTG due to ELOS  Skilled Therapeutic Interventions/Progress Updates:     SESSION 1:  Pt presents in room in [facility] with daughter Kennyth Lose present who expressed concerns for transfer via slideboard and requesting to complete hoyer lift training via manual lift. Pt daughter states that pt reports incontinent episode and requires return to bed anyway, requesting for personal care education to be addressed during session. Pt denies pain prior to session. Pt presents with hoyer pad in position in [facility] upon entry to room, this therapist educates on proper sling placement and maneuverability of hoyer lift. Increased time required to encourage pt to participate with transfer due to pt with reports of discomfort and poor tolerance to tactile sensation however not agreeable to alternate transfer techniques. Trial various placement of colored straps on hoyer lift with pt tolerating purple at head, green at waist, and blue at legs with crossing over of legs straps the best. Pt completes transfer from [facility] to bed with assist from 2nd person Investment banker, corporate) and pt daughter onlooking. Pt completes rolling in bed using hospital bed rails for removing pad with mod assist and verbal cueing for sequencing provided by both therapist and pt daughter. Pt completes rolling bilaterally for changing brief and pants in supine due to incontinent episode with therapist providing verbal cues for technique to pt daughter. Pt demonstrate ability to bridge hips up for doffing/donning pants in supine. Pt remains supine in bed with daughter present and all needs within reach with pt daughter reporting will contact nursing staff for transfer back to bed.  SESSION  2: Pt presents in room in [facility] with daughter Kennyth Lose present for second session of family education. Pt does not report pain this session. Pt transported to main gym dependently via WC to practice utilizing hoyer lift on mat. This therapist places pt daughter Kennyth Lose in hoyer lift to allow pt daughter to experience how the transfer feels to better assist her mother with her transfer and to note any discomforts her mother may be expressing. Pt daughter provided with step by step instructions for placing hoyer pad under her, how to manage hydraulic lift and lower, maneuvering mechanism, brakes on wheel, and how to widen legs of hoyer lift. Following instruction and demonstration on pt daughter this therapist educations pt daughter on how to place hoyer pad under pt while in sitting position with pt completing effective lateral lean bilaterally for hip clearance, wrapped pad under thighs. Pt daughter instructed on which color loops to attach to hoyer lift to maximize comfort for pt (purple at head, green at waist, blue at legs) with pt tolerating transfer much better this session as compared to previous one. Pt daughter manages hydraulic lift and lower as well as maneuvering hoyer lift as this therapist manages pt for positioning over mat, with pt daughter provided with cues for positioning. Pt then lowered to mat and pt daughter able to manage unhooking loops from hoyer lift as well as managing rolling pt for removal of hoyer pad. Pt daughter then replaces hoyer pad for transfer out of bed with verbal cueing from therapist for optimal positioning of hoyer pad, mod assist for rolling pt with pt daughter providing cues. Pt daughter then demonstrates  hooking correct colored loops to hoyer lift with verbal and visual cues from therapist for positioning of hoyer lift. Pt daughter then manages hoyer lift mechanism (hydraulic lift/lower), maneuvering lift, and positioning lift and managing lift legs over wheelchair while this  therapist assists with positioning pt and provides verbal cueing throughout. Once seated in [facility] hoyer pad removed with verbal cueing to pt daughter for technique. Pt transported back to room in [facility] dependently and remains seated in [facility] with daughter present for supervision, all needs within reach.   Therapy Documentation Precautions:  Precautions Precautions: Fall Precaution Comments: watch HR, HOH, R-hemi, Restrictions Weight Bearing Restrictions: No     Therapy/Group: Individual Therapy  Lorna Dibble 04/01/2022, 12:56 PM

## 2022-04-01 NOTE — Progress Notes (Signed)
PROGRESS NOTE   Subjective/Complaints:  OT daughter and patient working on bed mobility.  Daughter does not feel like the patient can be transferred safely at home with sliding board.  She would like to try a [employer] lift before discharge from rehab.  At 1 time they were hoping for earlier discharge however given mobility issues daughter would like to stay with original date.  Also they have a ramp that is being built that may not be completed  ROS- limited by cognition  Objective:   No results found. Recent Labs    03/31/22 0754  WBC 3.8*  HGB 13.3  HCT 39.5  PLT 232    Recent Labs    03/31/22 0754  NA 135  K 4.2  CL 104  CO2 21*  GLUCOSE 179*  BUN 14  CREATININE 0.78  CALCIUM 8.9      Intake/Output Summary (Last 24 hours) at 04/01/2022 0918 Last data filed at 04/01/2022 0645 Gross per 24 hour  Intake 474 ml  Output 1000 ml  Net -526 ml         Physical Exam: Vital Signs Blood pressure (!) 159/74, pulse 72, temperature 98 F (36.7 C), temperature source Oral, resp. rate 16, height 5\' 5"  (1.651 m), weight 58.5 kg, SpO2 94 %.   General: No acute distress, sitting in bed, a little irritable this morning Heart: Regular rate and rhythm no rubs murmurs or extra sounds Lungs: Clear to auscultation, breathing unlabored, no rales or wheezes Abdomen: Positive bowel sounds, soft, very mildly tender to palpation throughout but no significant or focal tenderness and seems to resolve with distraction, nondistended, no rebound/guarding/rigidity Extremities: No clubbing, cyanosis, or edema Skin: No evidence of breakdown, no evidence of rash  PRIOR EXAM: Neurologic: Cranial nerves II through XII intact, motor strength is 5/5 in left  deltoid, bicep, tricep, grip, hip flexor, knee extensors, ankle dorsiflexor and plantar flexor 3- Right delt, biceps, 3- wrist and finger flexors and extensor 3- Right HF, KE,  ADF  Musculoskeletal: Full range of motion in all 4 extremities. Right wrist drop no pain with ROM , no wrist effusion mild pain with sup/pronation, mild pain to palp over R ulnar styloid    Assessment/Plan: 1. Functional deficits which require 3+ hours per day of interdisciplinary therapy in a comprehensive inpatient rehab setting. Physiatrist is providing close team supervision and 24 hour management of active medical problems listed below. Physiatrist and rehab team continue to assess barriers to discharge/monitor patient progress toward functional and medical goals  Care Tool:  Bathing    Body parts bathed by patient: Right arm, Face   Body parts bathed by helper: Buttocks, Left arm, Front perineal area, Right upper leg, Left upper leg, Abdomen, Chest, Left lower leg, Right lower leg     Bathing assist Assist Level: Total Assistance - Patient < 25%     Upper Body Dressing/Undressing Upper body dressing   What is the patient wearing?: Pull over shirt, Button up shirt    Upper body assist Assist Level: Maximal Assistance - Patient 25 - 49%    Lower Body Dressing/Undressing Lower body dressing      What is the patient  wearing?: Pants, Underwear/pull up     Lower body assist Assist for lower body dressing: Total Assistance - Patient < 25%     Toileting Toileting Toileting Activity did not occur (Clothing management and hygiene only): N/A (no void or bm)  Toileting assist Assist for toileting: Total Assistance - Patient < 25% (simulated)     Transfers Chair/bed transfer  Transfers assist     Chair/bed transfer assist level: Maximal Assistance - Patient 25 - 49%     Locomotion Ambulation   Ambulation assist   Ambulation activity did not occur: Safety/medical concerns          Walk 10 feet activity   Assist  Walk 10 feet activity did not occur: Safety/medical concerns        Walk 50 feet activity   Assist Walk 50 feet with 2 turns activity did  not occur: Safety/medical concerns         Walk 150 feet activity   Assist Walk 150 feet activity did not occur: Safety/medical concerns         Walk 10 feet on uneven surface  activity   Assist Walk 10 feet on uneven surfaces activity did not occur: Safety/medical concerns         Wheelchair     Assist Is the patient using a wheelchair?: Yes Type of Wheelchair: Manual    Wheelchair assist level: Dependent - Patient 0% Max wheelchair distance: 244ft    Wheelchair 50 feet with 2 turns activity    Assist        Assist Level: Dependent - Patient 0%   Wheelchair 150 feet activity     Assist      Assist Level: Dependent - Patient 0%   Blood pressure (!) 159/74, pulse 72, temperature 98 F (36.7 C), temperature source Oral, resp. rate 16, height 5\' 5"  (1.651 m), weight 58.5 kg, SpO2 94 %.  Medical Problem List and Plan: 1. 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 2.  Antithrombotics: -DVT/anticoagulation:  Pharmaceutical: Eliquis according to pharmacy given age and GFR, will use 2.5mg  BID              -antiplatelet therapy: N/A 3. Pain Management: Tylenol as needed, muscle rub cream PRN -Hx of endstage OA of knees gets gel and cortisone injections last performed ~3wks ago per daughter 4. Mood/Behavior/Sleep: Namenda 10 mg twice daily, melatonin 3mg  QHS PRN             -antipsychotic agents: N/A  -03/30/22 sleeping well  5. Neuropsych/cognition: This patient is not capable of making decisions on her own behalf. Has moderate dementia- needs to be reminded everyday that  she has suffered a stroke , appears surprised everyday  6. Skin/Wound Care: Routine skin checks 7. Fluids/Electrolytes/Nutrition: Routine in and outs with follow-up chemistries weekly, next 03/31/22  -Cont Vit D3 400IU daily 8.  Atrial fibrillation with RVR LAA thrombus.  Continue Eliquis, Cardizem 360 mg daily.  Cardiac rate controlled 03/29/22 9.  UTI/Proteus  Mirabilis.  Completed  course of Rocephin -03/23/22 family concerned with urine still being amber; encourage PO fluids, monitor for cloudiness or UTI symptoms/behavior changes -03/29/22 no mention of concerns of urine color or UTI symptoms, continue monitoring 10.  Hyperlipidemia.  Lipitor 80mg  QD 11.  Essential hypertension.  Continue Cardizem as directed  -fair control 3/25 Vitals:   03/28/22 1433 03/28/22 1941 03/29/22 0535 03/29/22 1340  BP: (!) 140/61 (!) 148/60 124/71 125/60   03/29/22 1955 03/30/22 0507 03/30/22 1417 03/30/22 1910  BP: (!) 132/46 (!) 143/56 116/64 133/89   03/31/22 0433 03/31/22 1704 03/31/22 2033 04/01/22 0642  BP: (!) 143/89 108/70 112/70 (!) 159/74    12.  Diabetes mellitus.  Diet controlled at baseline.  Hemoglobin A1c 5.6. 13.  Chronic bronchitis.  Continue Dulera and Incruse inhalers as indicated 14. Urinary incontinence: This is chronic, daughter has purchased pure wick for home use. 15. Constipation with incontinence- suggest Sorbitol once seen therapy, so can get her on BSC -takes Colace and Senokot at home; ordered Colace 100mg  QD, Senokot 1tab QD, and Sorbitol 31ml PRN, monitor   LOS: 12 days A FACE TO FACE EVALUATION WAS PERFORMED  Charlett Blake 04/01/2022, 9:18 AM

## 2022-04-01 NOTE — Progress Notes (Signed)
Patient ID: OVAL BIRT, female   DOB: 08-Jan-1921, 89 y.o.   MRN: [id number]  SW followed up with daughter in reference to questions and concerns. Daughter will now her her daughter (pt's granddaughter)  assisting with Dme and [place] questions. Granddaughter should give SW a call today. Daughter provided phone number just in case.   Bridget [name]: [phone]

## 2022-04-02 MED ORDER — ACETAMINOPHEN 325 MG PO TABS: 650.0000 mg | ORAL_TABLET | Freq: Four times a day (QID) | ORAL | Status: DC | PRN

## 2022-04-02 NOTE — Progress Notes (Addendum)
Patient ID: Bridget [name], female   DOB: 04/10/1921, 89 y.o.   MRN: [id number]  [facility] and lap tray ordered through [place].

## 2022-04-02 NOTE — Progress Notes (Signed)
Occupational Therapy Session Note  Patient Details  Name: Bridget [name] MRN: [id number] Date of Birth: 1921-08-06  Today's Date: 04/02/2022 OT Individual Time: [id number] OT Individual Time Calculation (min): 45 min    Short Term Goals: Week 2:  OT Short Term Goal 1 (Week 2): STGs = LTGs  Skilled Therapeutic Interventions/Progress Updates:   Pt seen for skilled OT session with dtr bedside for [facility] Education. Pt already up in w/c upon OT arrival. Dtr requested OT training with simulation for sling placement under pt with rolling techniques with dtr demonstrating excellent recall from previous sessions and appropriate questions and suggestions. Pt reported stiffness in neck and OT applied moist heat to upper shoulders/neck region as nsg arrived for med administration. Pt able to place pill in R hand and self administer with L hand multiple pills with cues only with + integ of R hand. Dtr requested further trng with R UE NMRE and OT obtained items for bedside due to time mngt issues. Removed moist heat and pt reported some relief but unable to qualify/quantify pain. Pt able to demo R UE integration with dtr's hands on facilitation for activities including Slo-mo ball, 1 lb weight, Velcro cards, Squiggs, and clasped hand table slides and "wood chops". OT wrote out ideas for UE HEP, demonstrated each and left in d/c wall bin for carryover for home use. Hand off to NT for vitals.   Therapy Documentation Precautions:  Precautions Precautions: Fall Precaution Comments: watch HR, HOH, R-hemi, Restrictions Weight Bearing Restrictions: No    Therapy/Group: Individual Therapy  Barnabas Lister 04/02/2022, 7:52 AM

## 2022-04-02 NOTE — Progress Notes (Signed)
Occupational Therapy Session Note  Patient Details  Name: Bridget [name] MRN: [id number] Date of Birth: 1921/04/26  Today's Date: 04/02/2022 OT Individual Time: 1100-1205 OT Individual Time Calculation (min): 65 min    Short Term Goals: Week 2:  OT Short Term Goal 1 (Week 2): STGs = LTGs  Skilled Therapeutic Interventions/Progress Updates:    Pt and dtr in room.  Discussed practicing hoyer lift transfers, but first had pt come to the gym to engage in [place] activities.  Pt worked on guided movements with hand to stack cones, pinch yellow clothespins, and used light weight dowel bar.  Grasping the bar was not comfortable for her due to R thumb arthritis. Overall pt participated with cues and cooperated in session.  Returned pt to her room and obtained the hoyer lift from the gym.  Pt in wc and daughter wanted to practice with the lift more.  With demonstration and return demo daughter was able to get sling around patient, set up hoyer and use lift to elevate pt off the chair.   To return to the chair, showed daughter how her granddtr can use the strap on the back of the sling to pull her hips back and up to position hips as pt lowered into wc as daughter is lowering the lift.   Did not get pt w/c to bed as pt was very upset about using the sling and not happy about how it felt. Pt kept saying no, but her daughter and I told pt we would only practice a few minutes so her daughter could feel confident.    Stedy removed from room as that transfer method was not working.  Left hoyer lift in room so daughter could practice with it tonight with A from nursing staff.  Pt in wc with all needs met.   Therapy Documentation Precautions:  Precautions Precautions: Fall Precaution Comments: watch HR, HOH, R-hemi, Restrictions Weight Bearing Restrictions: No    Vital Signs: Therapy Vitals Temp: 97.9 F (36.6 C) Temp Source: Oral Pulse Rate: 65 Resp: 18 BP: (Abnormal) 142/68 Patient Position (if  appropriate): Lying Oxygen Therapy SpO2: 98 % O2 Device: Room Air Pain: Thumb pain with gripping   ADL: ADL Eating: Minimal assistance, Minimal cueing Where Assessed-Eating: Bed level Grooming: Moderate assistance Where Assessed-Grooming: Edge of bed Upper Body Bathing: Moderate assistance Where Assessed-Upper Body Bathing: Edge of bed Lower Body Bathing: Maximal assistance Where Assessed-Lower Body Bathing: Edge of bed Upper Body Dressing: Maximal assistance Where Assessed-Upper Body Dressing: Edge of bed Lower Body Dressing: Dependent, Maximal assistance Where Assessed-Lower Body Dressing: Edge of bed Toileting: Maximal assistance, Dependent (simulated) Where Assessed-Toileting: Bedside Commode Toilet Transfer: Dependent Toilet Transfer Method:  (+2 stabilizing wc for safety) Science writer: Radiographer, therapeutic: Not assessed Social research officer, government: Dependent (simulated) Social research officer, government Method: Education officer, environmental: Shower seat with back   Therapy/Group: Individual Therapy  [place] 04/02/2022, 8:54 AM

## 2022-04-02 NOTE — Progress Notes (Signed)
Occupational Therapy Session Note  Patient Details  Name: Bridget [name] MRN: [id number] Date of Birth: 02/22/1921  Today's Date: 04/02/2022 OT Individual Time: [id number] OT Individual Time Calculation (min): 55 min    Short Term Goals: Week 1:  OT Short Term Goal 1 (Week 1): Pt will feed self with min A ~50% of meal using adaptive strategies and AD PRN OT Short Term Goal 1 - Progress (Week 1): Met OT Short Term Goal 2 (Week 1): Pt will complete squat pivot transfers with mod A consistently OT Short Term Goal 2 - Progress (Week 1): Not progressing OT Short Term Goal 3 (Week 1): Pt will complete LB dressing mod A OT Short Term Goal 3 - Progress (Week 1): Not progressing  Skilled Therapeutic Interventions/Progress Updates:     Pt received in w/c with daughter present. Declines participation of hoyer transfer since mom is frustrated with hoyer requesting LE HEP provided written handout and demo as stated below.   Therapeutic exercise Access Code: [email or web address] URL: [email or web address] Date: 04/02/2022 Prepared by: Mariane Masters  Exercises - Seated Heel Slide  - 1 x daily - 7 x weekly - 3 sets - 10 reps - Seated Ankle Pumps  - 1 x daily - 7 x weekly - 3 sets - 10 reps - Seated Hip Adduction Isometrics with Ball  - 1 x daily - 7 x weekly - 3 sets - 10 reps - Seated Hip Abduction with Resistance  - 1 x daily - 7 x weekly - 3 sets - 10 reps - Seated March with Resistance  - 1 x daily - 7 x weekly - 3 sets - 10 reps - Seated Long Arc Quad  - 1 x daily - 7 x weekly - 3 sets - 10 reps   Therapeutic activity Seated tabletop functional reach of 1 inch cubes- unable to tip to tip pinch, bu table ot use composite grasp to pick up and place in bucket. Sorted silverware with MOD A to maintain grasp with RUE and 7/8 correctly sorted, reached for fake food items in categories but only 30% correct with sorted categories.  Pt left at end of session in w/c with exit alarm on, call  light in reach and all needs met   Therapy Documentation Precautions:  Precautions Precautions: Fall Precaution Comments: watch HR, HOH, R-hemi, Restrictions Weight Bearing Restrictions: No General:       Therapy/Group: Individual Therapy  Tonny Branch 04/02/2022, 6:54 AM

## 2022-04-02 NOTE — Plan of Care (Signed)
  Problem: RH Balance Goal: LTG Patient will maintain dynamic standing balance (PT) Description: LTG:  Patient will maintain dynamic standing balance with assistance during mobility activities (PT) Outcome: Not Applicable Note: Pt's severe Bil knee OA paired with acute CVA and other comorbidities have prevented progression in stance. Standing goals deemed inappropriate.    Problem: Sit to Stand Goal: LTG:  Patient will perform sit to stand with assistance level (PT) Description: LTG:  Patient will perform sit to stand with assistance level (PT) Outcome: Not Applicable Flowsheets (Taken 04/02/2022 0948) LTG: PT will perform sit to stand in preparation for functional mobility with assistance level: 2 Helpers Note: Pt's severe Bil knee OA paired with acute CVA and other comorbidities have prevented progression in stance. Standing goals deemed inappropriate.    Problem: RH Bed Mobility Goal: LTG Patient will perform bed mobility with assist (PT) Description: LTG: Patient will perform bed mobility with assistance, with/without cues (PT). Flowsheets (Taken 04/02/2022 0948) LTG: Pt will perform bed mobility with assistance level of: Moderate Assistance - Patient 50 - 74% Note: Pt's comorbidities are preventing further progress and she requires extensive cueing to participate.     Problem: RH Bed to Chair Transfers Goal: LTG Patient will perform bed/chair transfers w/assist (PT) Description: LTG: Patient will perform bed to chair transfers with assistance (PT). Flowsheets (Taken 04/02/2022 0948) LTG: Pt will perform Bed to Chair Transfers with assistance level: Total Assistance - Patient < 25% Note: Pt continues to have difficulty participating in slide board transfers and requires hoyer lift for all bed to chair transfers.

## 2022-04-02 NOTE — Progress Notes (Signed)
PROGRESS NOTE   Subjective/Complaints:  Awaiting ramp, family working on [employer], pt does not like Hoyer   ROS- limited by cognition  Objective:   No results found. Recent Labs    03/31/22 0754  WBC 3.8*  HGB 13.3  HCT 39.5  PLT 232    Recent Labs    03/31/22 0754  NA 135  K 4.2  CL 104  CO2 21*  GLUCOSE 179*  BUN 14  CREATININE 0.78  CALCIUM 8.9      Intake/Output Summary (Last 24 hours) at 04/02/2022 1013 Last data filed at 04/02/2022 0600 Gross per 24 hour  Intake 477 ml  Output --  Net 477 ml         Physical Exam: Vital Signs Blood pressure (!) 142/68, pulse 65, temperature 97.9 F (36.6 C), temperature source Oral, resp. rate 18, height 5\' 5"  (1.651 m), weight 58.5 kg, SpO2 98 %.   General: No acute distress, sitting in bed, a little irritable this morning Heart: Regular rate and rhythm no rubs murmurs or extra sounds Lungs: Clear to auscultation, breathing unlabored, no rales or wheezes Abdomen: Positive bowel sounds, soft, very mildly tender to palpation throughout but no significant or focal tenderness and seems to resolve with distraction, nondistended, no rebound/guarding/rigidity Extremities: No clubbing, cyanosis, or edema Skin: No evidence of breakdown, no evidence of rash  PRIOR EXAM: Neurologic: Cranial nerves II through XII intact, motor strength is 5/5 in left  deltoid, bicep, tricep, grip, hip flexor, knee extensors, ankle dorsiflexor and plantar flexor 3- Right delt, biceps, 3- wrist and finger flexors and extensor 3- Right HF, KE, ADF  Musculoskeletal: Full range of motion in all 4 extremities. Right wrist drop no pain with ROM , no wrist effusion mild pain with sup/pronation, mild pain to palp over R ulnar styloid    Assessment/Plan: 1. Functional deficits which require 3+ hours per day of interdisciplinary therapy in a comprehensive inpatient rehab  setting. Physiatrist is providing close team supervision and 24 hour management of active medical problems listed below. Physiatrist and rehab team continue to assess barriers to discharge/monitor patient progress toward functional and medical goals  Care Tool:  Bathing    Body parts bathed by patient: Right arm, Face   Body parts bathed by helper: Buttocks, Left arm, Front perineal area, Right upper leg, Left upper leg, Abdomen, Chest, Left lower leg, Right lower leg     Bathing assist Assist Level: Total Assistance - Patient < 25%     Upper Body Dressing/Undressing Upper body dressing   What is the patient wearing?: Pull over shirt, Button up shirt    Upper body assist Assist Level: Maximal Assistance - Patient 25 - 49%    Lower Body Dressing/Undressing Lower body dressing      What is the patient wearing?: Pants, Underwear/pull up     Lower body assist Assist for lower body dressing: Total Assistance - Patient < 25%     Toileting Toileting Toileting Activity did not occur (Clothing management and hygiene only): N/A (no void or bm)  Toileting assist Assist for toileting: Total Assistance - Patient < 25% (simulated)     Transfers Chair/bed transfer  Transfers assist     Chair/bed transfer assist level: Maximal Assistance - Patient 25 - 49%     Locomotion Ambulation   Ambulation assist   Ambulation activity did not occur: Safety/medical concerns          Walk 10 feet activity   Assist  Walk 10 feet activity did not occur: Safety/medical concerns        Walk 50 feet activity   Assist Walk 50 feet with 2 turns activity did not occur: Safety/medical concerns         Walk 150 feet activity   Assist Walk 150 feet activity did not occur: Safety/medical concerns         Walk 10 feet on uneven surface  activity   Assist Walk 10 feet on uneven surfaces activity did not occur: Safety/medical concerns          Wheelchair     Assist Is the patient using a wheelchair?: Yes Type of Wheelchair: Manual    Wheelchair assist level: Dependent - Patient 0% Max wheelchair distance: 256ft    Wheelchair 50 feet with 2 turns activity    Assist        Assist Level: Dependent - Patient 0%   Wheelchair 150 feet activity     Assist      Assist Level: Dependent - Patient 0%   Blood pressure (!) 142/68, pulse 65, temperature 97.9 F (36.6 C), temperature source Oral, resp. rate 18, height 5\' 5"  (1.651 m), weight 58.5 kg, SpO2 98 %.  Medical Problem List and Plan: 1. 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 Formulized individual treatment plan based on medical history, underlying problem and comorbidities.  2.  Antithrombotics: -DVT/anticoagulation:  Pharmaceutical: Eliquis according to pharmacy given age and GFR, will use 2.5mg  BID              -antiplatelet therapy: N/A 3. Pain Management: Tylenol as needed, muscle rub cream PRN -Hx of endstage OA of knees gets gel and cortisone injections last performed ~3wks ago per daughter 4. Mood/Behavior/Sleep: Namenda 10 mg twice daily, melatonin 3mg  QHS PRN             -antipsychotic agents: N/A  -03/30/22 sleeping well  5. Neuropsych/cognition: This patient is not capable of making decisions on her own behalf. Has moderate dementia- needs to be reminded everyday that she has suffered a stroke , appears surprised everyday  6. Skin/Wound Care: Routine  skin checks 7. Fluids/Electrolytes/Nutrition: Routine in and outs with follow-up chemistries weekly, next 03/31/22  -Cont Vit D3 400IU daily 8.  Atrial fibrillation with RVR LAA thrombus.  Continue Eliquis, Cardizem 360 mg daily.  Cardiac rate controlled 04/02/22 9.  UTI/Proteus Mirabilis.  Completed  course of Rocephin -03/23/22 family concerned with urine still being amber; encourage PO fluids, monitor for cloudiness or UTI symptoms/behavior changes -03/29/22 no mention of concerns of urine color or UTI  symptoms, continue monitoring 10.  Hyperlipidemia.  Lipitor 80mg  QD 11.  Essential hypertension.  Continue Cardizem as directed  -fair control 3/27 Vitals:   03/29/22 1340 03/29/22 1955 03/30/22 0507 03/30/22 1417  BP: 125/60 (!) 132/46 (!) 143/56 116/64   03/30/22 1910 03/31/22 0433 03/31/22 1704 03/31/22 2033  BP: 133/89 (!) 143/89 108/70 112/70   04/01/22 0642 04/01/22 1300 04/01/22 1951 04/02/22 0529  BP: (!) 159/74 130/65 132/87 (!) 142/68    12.  Diabetes mellitus.  Diet controlled at baseline.  Hemoglobin A1c 5.6. 13.  Chronic bronchitis.  Continue Dulera and Incruse inhalers as indicated 14. Urinary incontinence: This is chronic, daughter has purchased pure wick for home use. 15. Constipation with incontinence- suggest Sorbitol once seen therapy, so can get her on BSC -takes Colace and Senokot at home; ordered Colace 100mg  QD, Senokot 1tab QD, and Sorbitol 75ml PRN, monitor   LOS: 13 days A FACE TO FACE EVALUATION WAS PERFORMED  Charlett Blake 04/02/2022, 10:13 AM

## 2022-04-02 NOTE — Progress Notes (Signed)
Patient ID: Bridget [name], female   DOB: 1921-10-31, 89 y.o.   MRN: [id number]  Team Conference Report to Patient/Family  Team Conference discussion was reviewed with the patient and caregiver, including goals, any changes in plan of care and target discharge date.  Patient and caregiver express understanding and are in agreement.  The patient has a target discharge date of 04/05/22.  Sw met with patient and daughter and provided conferences. Sw informed daughter that SW is aware of the transport chair to [facility] WC switch. Half lap tray requested as well. Sw will provide daughter with transportation resources for appointments. SW will arrange PTAR for d/c. No additional questions or concerns.   [name] 04/02/2022, 2:10 PM

## 2022-04-02 NOTE — Plan of Care (Signed)
  Problem: RH Pre-functional/Other (Specify) Goal: RH LTG PT (Specify) 1 Description:  RH LTG PT (Specify) 1 Flowsheets (Taken 04/02/2022 1005) LTG: Other PT (Specify) 1: Family will demonstrate ability to perform dependent hoyer transfer of patient with IND.

## 2022-04-02 NOTE — Patient Care Conference (Signed)
Inpatient RehabilitationTeam Conference and Plan of Care Update Date: 04/02/2022   Time: 10:12 AM    Patient Name: Bridget [name]      Medical Record Number: [id number]  Date of Birth: 03-21-1921 Sex: Female         Room/Bed: 4W05C/4W05C-01 Payor Info: Payor: MEDICARE / Plan: MEDICARE PART A AND B / Product Type: *No Product type* /    Admit Date/Time:  03/20/2022  5:30 PM  Primary Diagnosis:  Ischemic stroke of frontal lobe [facility])  Hospital Problems: Principal Problem:   Ischemic stroke of frontal lobe [facility]) Active Problems:   Embolic cerebral infarction [facility])    Expected Discharge Date: Expected Discharge Date: 04/05/22  Team Members Present: Physician leading conference: Dr. Alysia Penna Social Worker Present: Erlene Quan, BSW Nurse Present: Dorien Chihuahua, RN PT Present: Page Spiro, PT OT Present: Other (comment) Lowella Fairy, OT) [place] Coordinator present : Gunnar Fusi, SLP     Current Status/Progress Goal Weekly Team Focus  Bowel/Bladder   Continent of bowel and bladder   Toilet pt  to prevent skin breakdown   Assess skin Q shift and PRN    Swallow/Nutrition/ Hydration               ADL's   total A- MAX A +2 helpful for ADLS from bed level, MOD A +2 for SB transfers, working on toileting from [facility] however d/t pt mood/participation may be more realistic to complete from bed level, family has been very participatory and realistic. daily family ed.   goals downgraded to MOD- MAX A   family ed, decreasing caregiver burden    Mobility   Mod A for bed mobility when motivated, Mod/ MaxA+2 to 3  for slide board transfers, TotA for hoyer transfers   goals downgraded to maxA overall  continued family ed for transfers including car transfers, pain mgmt    Communication                Safety/Cognition/ Behavioral Observations               Pain   Denies pain   Maintian pain free   Assess pain level Q shift and  PRN    Skin   Intact    Maintian skin integrity  Assess skin Q shift and PRN      Discharge Planning:  Discharging home on Saturday with daughter and grandson to assist. Family education on Thursday 9-12.   Team Discussion: Patient continues to be incontinent using Tylenol prn pain control. Progress limited by behavior/limited participation/mood,  etc.  Patient on target to meet rehab goals: no, downgraded goals to max assist. Requires total asisst + 2 for ADLs at the bed level, mod assist for slide board +2 for toileting. Recommended hoyer lift instead of slide- board transfers.   *See Care Plan and progress notes for long and short-term goals.   Revisions to Treatment Plan:  Downgraded goals   Teaching Needs: Safety, equipment use, medications, dietary modification recommendations, transfers, toileting, etc.  Current Barriers to Discharge: Decreased caregiver support, Home enviroment access/layout, Incontinence, and Behavior  Possible Resolutions to Barriers: Family education [employer] for HS [id number] bed, hoyer lift, W/C, DA - BSC, long slide board Non emergent transport home; information on w/c transport for appointments     Medical Summary Current Status: right HP persists,BP lability,  Barriers to Discharge: Incontinence;Medical stability   Possible Resolutions to [employer] Focus: Family training with Harrel Lemon, [facility] training for Pt and family   Continued  Need for Acute Rehabilitation Level of Care: The patient requires daily medical management by a physician with specialized training in physical medicine and rehabilitation for the following reasons: Direction of a multidisciplinary physical rehabilitation program to maximize functional independence : Yes Medical management of patient stability for increased activity during participation in an intensive rehabilitation regime.: Yes Analysis of laboratory values and/or radiology reports with any subsequent need for medication adjustment and/or  medical intervention. : Yes   I attest that I was present, lead the team conference, and concur with the assessment and plan of the team.   Dorien Chihuahua B 04/02/2022, 2:25 PM

## 2022-04-02 NOTE — Progress Notes (Signed)
Physical Therapy Session Note  Patient Details  Name: Bridget [name] MRN: [id number] Date of Birth: [birth year]  Today's Date: 04/02/2022 PT Individual Time: 0803-0849 PT Individual Time Calculation (min): 46 min   Short Term Goals: Week 2:  PT Short Term Goal 1 (Week 2): STG=LTG due to ELOS  Skilled Therapeutic Interventions/Progress Updates:    Pt presents in room in bed with pt great granddaughter [place] Mam present. Pt denies pain at this time however demonstrates decreased tolerance to handling throughout session with vocal outbursts of pain. Family education carried over into this session for continued caregiver training with hoyer lift. Initially pt great granddaughter instructed on differences between manual hoyer lift and electric lift (maxi move) with demonstration of hydraulic lift and lower as well as pad placement. During session pt daughter Kennyth Lose arrives and [employer] [place] requesting to continue practice with all aspects of hoyer lift management for transfer bed to [facility] due to her continuing to requiring cueing. Pt with increased irritability this session and decreased tolerance to handling requiring increased time for all activities with this therapist assisting with donning pants in supine dependently, able to manage up to hips and pt able to lift hips with verbal cueing from pt daughter with pt demonstrating improved participation with cueing from daughter vs therapist. Pt completes rolling with mod/max assist bilaterally with daughter providing cues secondary to pt with decreased motivation to participate, rolling completed to complete donning pants as well as for hoyer pad placement with therapist providing verbal cues for body mechanics (elevating hospital bed) and technique for positioning pad. Pt daughter completes management of hoyer and attachment of colored loops (purple at head, green at waist, blue at legs) with improved carryover for which colors to apply from previous  training sessions however does require verbal cueing required for crossing over leg straps to opposite side as well as bringing straps under legs prior to hooking loop to lift with pt daughter demonstrating understanding. This therapist manages hoyer lift for lift and lower as well as steering mechanism while pt daughter manages pt positioning for transfer to [facility] with verbal cueing provided to pt daughter for positioning pt feet away from chair during transfer as well as managing hips posteriorly into WC. Once sitting in [facility] pt daughter manages taking hoyer pad out from under pt without verbal cueing. Pt daughter then dependently donns pt shoes and socks with verbal cueing provided from therapist for body mechanics to decrease caregiver strain and burden with pt daughter verbalizing understanding. Pt remains seated in [facility] with daughter present, all needs met at end of session.  Therapy Documentation Precautions:  Precautions Precautions: Fall Precaution Comments: watch HR, HOH, R-hemi, Restrictions Weight Bearing Restrictions: No     Therapy/Group: Individual Therapy  Lorna Dibble PT, DPT 04/02/2022, 11:31 AM

## 2022-04-03 NOTE — Progress Notes (Signed)
Patient ID: Bridget [name], female   DOB: Jun 06, 1921, 89 y.o.   MRN: [id number] Referral to [employer] for incontinence supply options available to patient and family. Margarito Liner

## 2022-04-03 NOTE — Progress Notes (Signed)
Patient up in W/C attending PT and OT. Patient has a grounds pass and goes down stairs with family. Denies pain or discomfort. Unable to get patients weight, patient has been unavailable. Will Endorce to PM Nurse.

## 2022-04-03 NOTE — Progress Notes (Signed)
Occupational Therapy Session Note  Patient Details  Name: Bridget [name] MRN: [id number] Date of Birth: [birth year]  Today's Date: 04/03/2022 OT Individual Time: 0906-1006 session 1 OT Individual Time Calculation (min): 60 min  Session 2: 1422-1530 ( 7 mins missed as pt beginning to become agitated)    Short Term Goals: Week 2:  OT Short Term Goal 1 (Week 2): STGs = LTGs  Skilled Therapeutic Interventions/Progress Updates:  Session 1: Pt greeted supine in bed with daughter Bridget [name]) and granddaughter ( Bridget [name]) present for family education. pt agreeable to OT intervention. Worked on bed level ADLS with education provided to Bridget [name] and Bridget [name] on techniques for bed level dressing to decrease burden of care and decrease pain/irritation for pt.  Overall, pt still required total A for dressing, both daughter and granddaughter carrying over techniques well.   Education provided on technique for donning hoyer sling from bed level with pt rolling R<>L to place, did educate that family has the option of placing the sling behind the pt when she transitions into long sitting for dressing. Full demo provided to granddaughter on using manual hoyer lift as this was the first time she had trained on DME. Pt tolerated transfer well with family needing MIN- MOD cues for positioning of w/c during transfer and overall technique when driving hoyer lift into w/c. Demo provided on how to doff sling once pt in w/c. Did provide hands on training regarding w/c parts, family decided to remove lumbar support in w/c as lumbar support seemed to position pt too far forward in chair.   Pt left seated in w/c in care of Bridget [name] and Bridget [name].          Session 2: Pt greeted supine in bed with Bridget [name] present ( daughter). Bridget [name] reports pt had been transferred back to bed for toileting. Bridget [name] with questions regarding options for toileting at home. First, recommended Bridget [name] work towards timed toileting schedule with pt either being  positioned on bed pan or working with Bridget [name] on SB transfer to Bridget [name]. Education provided on option of completing SB transfer to Bridget [name] with pillow case on board and LB clothing doffed and then returning back to bed for pericare.   Pt then stated she wanted to sit up and "walk",even though pt pleasantly confused about LOF allowed pt to transition from supine>sit with assist from Bridget [name]. Placed RW in front of pt and assisted Bridget [name] with attempted sit>stand, pt unable to stand but was able to elevate buttock minimally from EOB.   Utilized SB to transition pt to w/c ( did educate Bridget [name] that sling could be placed behind pt from EOB and pt could be lifted from EOB>w/c with manual hoyer lift). Pt able to complete SB transfer to L side with MOD A +2. Total A transport to gym with continued practice on SB transfers with Bridget [name] leading transfer technique. Bridget [name] continues to require MIN cues for technique and correct placement of board. Continued to recommend that the safety transfer method at home would be hoyer lift but did encourage Bridget [name] to practice SB transfers with Bridget [name].   Education provided on providing visual cues such as dycem on transfer board to provide for optimal hand placement and assist with visual cue of scoot across board towards "yellow square." Additionally reviewed various BLE WB'ing tasks that could be completed from home to maintain BLE muscle integrity such as bridging from bed level during ADLs, dynamic anterior reaching from EOB with an emphasis on Bridget [name].   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   Ended session with pt seated in w/c in care of Bridget [name].              Therapy Documentation Precautions:  Precautions Precautions: Fall Precaution Comments: watch HR, HOH, R-hemi, Restrictions Weight Bearing Restrictions: No  Pain: general aches and pains reported throughout  sessions, rest breaks, repositioning and distraction all provided as pain mgmt strategies.     Therapy/Group: Individual Therapy  Bridget [name] [place] [facility] [name] 04/03/2022, 11:25 AM

## 2022-04-03 NOTE — Progress Notes (Signed)
Physical Therapy Session Note  Patient Details  Name: Bridget [name] MRN: [id number] Date of Birth: 09-02-1921  Today's Date: 04/03/2022 PT Individual Time: 1100-1205 PT Individual Time Calculation (min): 65 min   Short Term Goals: Week 2:  PT Short Term Goal 1 (Week 2): STG=LTG due to ELOS  Skilled Therapeutic Interventions/Progress Updates:  Patient greeted sitting upright in wheelchair in room with daughter and great granddaughter, Kennyth Lose and Overton Mam, present and agreeable to PT treatment session. Family present for hands-on family training/education in order to ensure a safe discharge home. Patient requesting to use the restroom and discussed with family options between [facility], bedpan, [place], etc. Family reporting they purchased a purewick for use at home at night and educated that this could be an option for not only at night, but also when patient reports need to use the bathroom while supine in the bed since patient does not like the bedpan. Family reporting understanding and agreeable to this option in order to decrease overall burden of care and improve ease of transfers, pericare, clothing management, etc. Educated family on how to don hoyer sling while patient is sitting in wheelchair- Family was able to don sling with Min VC for improved positioning and crossing the legs of the sling. Patient was then transferred to the bed dependently via hoyer sling with good safety awareness by family- Minor VC for making the legs of the hoyer narrow in order to improve maneuverability and proper fit under the bed. Daughter educated regarding coming to the opposite side of the bed in order to pull the sling toward her for safe landing and proper positioning in the bed as well as good body mechanics, instead of pushing patient and standing on the same side of the hoyer. Patient was able to bridge in order to doff pants/brief- Patient had not urinated at this time, so family donned brief by having patient  roll and encouraged urination by sitting patient upright in the bed. Therapist provided education regarding how to properly use bed functions. Daughter and granddaughter talked through how they would get the patient back to the wheelchair from the bed via hoyer with good understanding. All questions answered throughout family training session with education regarding safe handling of equipment, proper body mechanics and safe handling of patient.   Patient left sitting upright in bed with family present and all needs met.    Therapy Documentation Precautions:  Precautions Precautions: Fall Precaution Comments: watch HR, HOH, R-hemi, Restrictions Weight Bearing Restrictions: No  Therapy/Group: Individual Therapy  Marialuiza Car 04/03/2022, 10:55 AM

## 2022-04-04 MED ORDER — ATORVASTATIN CALCIUM 80 MG PO TABS: 80.0000 mg | ORAL_TABLET | Freq: Every day | ORAL | 0 refills | Status: DC

## 2022-04-04 MED ORDER — NITROFURANTOIN MACROCRYSTAL 50 MG PO CAPS: 50.0000 mg | ORAL_CAPSULE | Freq: Every day | ORAL | 0 refills | Status: DC

## 2022-04-04 MED ORDER — CHOLECALCIFEROL 10 MCG (400 UNIT) PO TABS: 400.0000 [IU] | ORAL_TABLET | Freq: Every day | ORAL | 0 refills | Status: DC

## 2022-04-04 MED ORDER — ALBUTEROL SULFATE HFA 108 (90 BASE) MCG/ACT IN AERS: 2.0000 | INHALATION_SPRAY | Freq: Four times a day (QID) | RESPIRATORY_TRACT | 0 refills | Status: DC | PRN

## 2022-04-04 MED ORDER — APIXABAN 2.5 MG PO TABS: 2.5000 mg | ORAL_TABLET | Freq: Two times a day (BID) | ORAL | 0 refills | Status: DC

## 2022-04-04 MED ORDER — MELATONIN 3 MG PO TABS: 3.0000 mg | ORAL_TABLET | Freq: Every evening | ORAL | 0 refills | Status: DC | PRN

## 2022-04-04 MED ORDER — MEMANTINE HCL 10 MG PO TABS: 10.0000 mg | ORAL_TABLET | Freq: Two times a day (BID) | ORAL | 0 refills | Status: DC

## 2022-04-04 MED ORDER — DILTIAZEM HCL ER BEADS 180 MG PO CP24: 360.0000 mg | ORAL_CAPSULE | Freq: Every day | ORAL | 0 refills | Status: DC

## 2022-04-04 NOTE — Progress Notes (Signed)
PROGRESS NOTE   Subjective/Complaints:  No issues overnite per daughter , in OT working on Sliding board transfers   ROS- limited by cognition  Objective:   No results found. No results for input(s): "WBC", "HGB", "HCT", "PLT" in the last 72 hours.  No results for input(s): "NA", "K", "CL", "CO2", "GLUCOSE", "BUN", "CREATININE", "CALCIUM" in the last 72 hours.    Intake/Output Summary (Last 24 hours) at 04/04/2022 0931 Last data filed at 04/04/2022 0030 Gross per 24 hour  Intake 300 ml  Output 300 ml  Net 0 ml         Physical Exam: Vital Signs Blood pressure (!) 137/59, pulse 95, temperature 98 F (36.7 C), resp. rate 17, height 5\' 5"  (1.651 m), weight 58.5 kg, SpO2 96 %.   General: No acute distress, sitting in bed, a little irritable this morning Heart: Regular rate and rhythm no rubs murmurs or extra sounds Lungs: Clear to auscultation, breathing unlabored, no rales or wheezes Abdomen: Positive bowel sounds, soft, very mildly tender to palpation throughout but no significant or focal tenderness and seems to resolve with distraction, nondistended, no rebound/guarding/rigidity Extremities: No clubbing, cyanosis, or edema Skin: No evidence of breakdown, no evidence of rash  PRIOR EXAM: Neurologic: Cranial nerves II through XII intact, motor strength is 5/5 in left  deltoid, bicep, tricep, grip, hip flexor, knee extensors, ankle dorsiflexor and plantar flexor 3- Right delt, biceps, 3- wrist and finger flexors and extensor 3- Right HF, KE, ADF  Musculoskeletal: Full range of motion in all 4 extremities. Right wrist drop no pain with ROM , no wrist effusion mild pain with sup/pronation, mild pain to palp over R ulnar styloid    Assessment/Plan: 1. Functional deficits which require 3+ hours per day of interdisciplinary therapy in a comprehensive inpatient rehab setting. Physiatrist is providing close team  supervision and 24 hour management of active medical problems listed below. Physiatrist and rehab team continue to assess barriers to discharge/monitor patient progress toward functional and medical goals  Care Tool:  Bathing    Body parts bathed by patient: Right arm, Face   Body parts bathed by helper: Buttocks, Left arm, Front perineal area, Right upper leg, Left upper leg, Abdomen, Chest, Left lower leg, Right lower leg     Bathing assist Assist Level: Total Assistance - Patient < 25%     Upper Body Dressing/Undressing Upper body dressing   What is the patient wearing?: Pull over shirt, Button up shirt    Upper body assist Assist Level: Maximal Assistance - Patient 25 - 49%    Lower Body Dressing/Undressing Lower body dressing      What is the patient wearing?: Pants, Underwear/pull up     Lower body assist Assist for lower body dressing: Total Assistance - Patient < 25%     Toileting Toileting Toileting Activity did not occur (Clothing management and hygiene only): N/A (no void or bm)  Toileting assist Assist for toileting: Total Assistance - Patient < 25% (simulated)     Transfers Chair/bed transfer  Transfers assist     Chair/bed transfer assist level: Maximal Assistance - Patient 25 - 49%     Locomotion Ambulation  Ambulation assist   Ambulation activity did not occur: Safety/medical concerns          Walk 10 feet activity   Assist  Walk 10 feet activity did not occur: Safety/medical concerns        Walk 50 feet activity   Assist Walk 50 feet with 2 turns activity did not occur: Safety/medical concerns         Walk 150 feet activity   Assist Walk 150 feet activity did not occur: Safety/medical concerns         Walk 10 feet on uneven surface  activity   Assist Walk 10 feet on uneven surfaces activity did not occur: Safety/medical concerns         Wheelchair     Assist Is the patient using a wheelchair?:  Yes Type of Wheelchair: Manual    Wheelchair assist level: Dependent - Patient 0% Max wheelchair distance: 2101ft    Wheelchair 50 feet with 2 turns activity    Assist        Assist Level: Dependent - Patient 0%   Wheelchair 150 feet activity     Assist      Assist Level: Dependent - Patient 0%   Blood pressure (!) 137/59, pulse 95, temperature 98 F (36.7 C), resp. rate 17, height 5\' 5"  (1.651 m), weight 58.5 kg, SpO2 96 %.  Medical Problem List and Plan: 1. 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  Antithrombotics: -DVT/anticoagulation:  Pharmaceutical: Eliquis according to pharmacy given age and GFR, will use 2.5mg  BID              -antiplatelet therapy: N/A 3. Pain Management: Tylenol as needed, muscle rub cream PRN -Hx of endstage OA of knees gets gel and cortisone injections last performed ~3wks ago per daughter 4. Mood/Behavior/Sleep: Namenda 10 mg twice daily, melatonin 3mg  QHS PRN             -antipsychotic agents: N/A  -03/30/22 sleeping well  5. Neuropsych/cognition: This patient is not capable of making decisions on her own behalf. Has moderate dementia- needs to be reminded everyday that she has suffered a stroke , appears surprised everyday  6. Skin/Wound Care: Routine skin checks 7. Fluids/Electrolytes/Nutrition: Routine in and outs with follow-up chemistries weekly, next 03/31/22  -Cont Vit D3 400IU daily 8.  Atrial fibrillation with RVR LAA thrombus.  Continue Eliquis, Cardizem  360 mg daily.  Cardiac rate controlled 04/02/22 9.  UTI/Proteus Mirabilis.  Completed  course of Rocephin -03/23/22 family concerned with urine still being amber; encourage PO fluids, monitor for cloudiness or UTI symptoms/behavior changes -03/29/22 no mention of concerns of urine color or UTI symptoms, continue monitoring 10.  Hyperlipidemia.  Lipitor 80mg  QD 11.  Essential hypertension.  Continue Cardizem as directed  -fair control 3/27 Vitals:   03/31/22 0433 03/31/22 1704 03/31/22 2033 04/01/22 0642  BP: (!) 143/89  108/70 112/70 (!) 159/74   04/01/22 1300 04/01/22 1951 04/02/22 0529 04/02/22 1358  BP: 130/65 132/87 (!) 142/68 122/60   04/02/22 1942 04/03/22 0518 04/03/22 1712 04/03/22 1957  BP: (!) 143/73 134/61 124/64 (!) 137/59    12.  Diabetes mellitus.  Diet controlled at baseline.  Hemoglobin A1c 5.6. 13.  Chronic bronchitis.  Continue Dulera and Incruse inhalers as indicated 14. Urinary incontinence: This is chronic, daughter has purchased pure wick for home use. 15. Constipation with incontinence- suggest Sorbitol once seen therapy, so can get her on BSC -takes Colace and Senokot at home; ordered Colace 100mg  QD, Senokot 1tab QD, and Sorbitol 83ml PRN, monitor   LOS: 15 days A FACE TO FACE EVALUATION WAS PERFORMED  Bridget [name] 04/04/2022, 9:31 AM

## 2022-04-04 NOTE — Progress Notes (Signed)
Inpatient Rehabilitation Discharge Medication Review by a Pharmacist  A complete drug regimen review was completed for this patient to identify any potential clinically significant medication issues.  High Risk Drug Classes Is patient taking? Indication by Medication  Antipsychotic No   Anticoagulant Yes Eliquis - PAF  Antibiotic Yes Po Nitrofurantoin - UTI prevention  Opioid No   Antiplatelet No   Hypoglycemics/insulin No   Vasoactive Medication Yes Diltiazem-PAF  Chemotherapy No   Other Yes Breztri, Albuterol inhalers - COPD Prolia - osteoporosis Atorvastatin - HLD Memantine - dementia     Type of Medication Issue Identified Description of Issue Recommendation(s)  Drug Interaction(s) (clinically significant)     Duplicate Therapy     Allergy     No Medication Administration End Date     Incorrect Dose     Additional Drug Therapy Needed     Significant med changes from prior encounter (inform family/care partners about these prior to discharge).    Other       Clinically significant medication issues were identified that warrant physician communication and completion of prescribed/recommended actions by midnight of the next day:  No  Pharmacist comments: None  Time spent performing this drug regimen review (minutes): 20 minutes  Thank you Anette Guarneri, PharmD

## 2022-04-04 NOTE — Progress Notes (Signed)
Physical Therapy Session Note  Patient Details  Name: Bridget [name] MRN: [id number] Date of Birth: [birth year]  Today's Date: 04/04/2022 PT Individual Time: 1100-1155 PT Individual Time Calculation (min): 55 min   Short Term Goals: Week 1:  PT Short Term Goal 1 (Week 1): Pt will perform transfers w/ Mod A using LRAD PT Short Term Goal 1 - Progress (Week 1): Not met PT Short Term Goal 2 (Week 1): Pt will tolerate standing for >2 minutes PT Short Term Goal 2 - Progress (Week 1): Not met PT Short Term Goal 3 (Week 1): Pt will intiate gait of at east 5 feet PT Short Term Goal 3 - Progress (Week 1): Not met Week 2:  PT Short Term Goal 1 (Week 2): STG=LTG due to ELOS  Skilled Therapeutic Interventions/Progress Updates:    Pt received sitting upright in [facility] w/ daughter, Kennyth Lose present. Both agreeable to PT session. Focus of today's session was on family education and practicing functional transfers, due to pt going home tomorrow.   Pt's daughter worked on Transport planner on pt in sitting. Pt's daughter educated on how best to position the sling to assist in having the pt in a more upright position vs laid back. Pt's daughter demonstrated understanding of this. Worked on [employer] transfer from [facility] to [facility]. Pt performed R transfer to [facility] w/ Mod A +2. Verbal cues for pt to lean to the L so daughter could position the board under her. Pt demonstrated improved tolerance to transfer and increased participation allowing for a more functional transfer. From [facility], pt performed L lateral transfer back to [facility] Mod A +2 and similar cueing used. Daughter has shown ability to safely transfer pt Mod I for increased time and planning.  Pt left in WC in the care of daughter w/ all needs met.    Therapy Documentation Precautions:  Precautions Precautions: Fall Precaution Comments: watch HR, HOH, R-hemi, Restrictions Weight Bearing Restrictions: No General:  Pain: pt denies any pain         Therapy/Group: Individual Therapy  Clella Mckeel 04/04/2022, 12:48 PM

## 2022-04-04 NOTE — Progress Notes (Signed)
Inpatient Rehabilitation Care Coordinator Discharge Note   Patient Details  Name: Bridget [name] MRN: [id number] Date of Birth: 1921-07-15   Discharge location: Home with daughter and granddaughter  Length of Stay: 16 Days  Discharge activity level: MOD A  Home/community participation: Daughter and granddaughter  Patient response [id number] Literacy - How often do you need to have someone help you when you read instructions, pamphlets, or other written material from your doctor or pharmacy?: Always  Patient response [id number] Isolation - How often do you feel lonely or isolated from those around you?: Patient unable to respond  Services provided included: MD, PT, RD, OT, SLP, RN, CM, TR, Pharmacy, SW  Financial Services:  Financial Services Utilized: Medicare    Choices offered to/list presented to: Daughter, Photographer  Follow-up services arranged:  Home Health, DME Home Health Agency: [facility] PT OT    DME : Wheelchair, Lap tray, hospital bed, hoyer, transger board, bariatic drop arm BSC    Patient response to transportation need: Is the patient able to respond to transportation needs?: Yes In the past 12 months, has lack of transportation kept you from medical appointments or from getting medications?: No In the past 12 months, has lack of transportation kept you from meetings, work, or from getting things needed for daily living?: No    Comments (or additional information):  Patient/Family verbalized understanding of follow-up arrangements:  Yes  Individual responsible for coordination of the follow-up plan: Elsie Amis [phone]  Confirmed correct DME delivered: [name] 04/04/2022    [name]

## 2022-04-04 NOTE — Progress Notes (Signed)
Patient ID: Bridget [name], female   DOB: [birth year], 89 y.o.   MRN: [id number]  Sw unable to arrange [facility] transport for the weekend. Please call [facility] to arrange transportation for d/c on 3/30.   Detailed instructions to arrange [facility] (inside packet) and discharge packet for [facility] left at nursing station.  Please arrange at [phone].

## 2022-04-04 NOTE — Progress Notes (Signed)
Patient ID: CHEYENE MANYGOATS, female   DOB: 1921/09/27, 89 y.o.   MRN: [id number]  Lap trays OOS at [facility]. Will be delivered to patient's home per [employer].

## 2022-04-04 NOTE — Progress Notes (Signed)
Occupational Therapy Discharge Summary  Patient Details  Name: Bridget [name] MRN: [id number] Date of Birth: [birth year]  Date of Discharge from [facility] service:April 04, 2022   Patient has met 6 of 8 long term goals due to ability to compensate for deficits and caregiver trained on how to assist patient .  Patient to discharge at [facility] Max Assist level.  Patient's care partner is independent to provide the necessary physical and cognitive assistance at discharge.  Pt's daughter and great granddaughter participated in numerous therapy sessions to learn how to use both slide board and hoyer transfers along with how to facilitate self care skills.   Reasons goals not met: pt did not reach min A with dynamic sit balance as she needs mod A to help her with lateral leans and scooting, she is not fully mod A with UB dressing as she often needs max A.   Recommendation:  Patient will benefit from ongoing skilled OT services in home health setting to continue to advance functional skills in the area of BADL and Reduce care partner burden.  Equipment: Wide drop arm BSC, slide board, hoyer lift, hospital bed, lap tray for w/c  Reasons for discharge: treatment goals met  Patient/family agrees with progress made and goals achieved: Yes  OT Discharge Precautions/Restrictions  Precautions Precautions: Fall Restrictions Weight Bearing Restrictions: No   ADL ADL Eating: Minimal assistance, Minimal cueing Where Assessed-Eating: Bed level Grooming: Minimal assistance, Minimal cueing Where Assessed-Grooming: Wheelchair Upper Body Bathing: Moderate assistance Where Assessed-Upper Body Bathing: Edge of bed Lower Body Bathing: Maximal assistance Where Assessed-Lower Body Bathing: Bed level Upper Body Dressing: Maximal assistance Where Assessed-Upper Body Dressing: Edge of bed Lower Body Dressing: Dependent Where Assessed-Lower Body Dressing: Bed level Toileting: Dependent Where Assessed-Toileting:  Other (Comment) (bedpan or BSC) Toilet Transfer: Dependent Toilet Transfer Method: Teacher, adult education, Other (comment) (or hoyer if using BSC) Toilet Transfer Equipment: Drop arm bedside commode Tub/Shower Transfer: Not assessed Social research officer, government: Not assessed Social research officer, government Method: Education officer, environmental: Civil engineer, contracting with back Vision Baseline Vision/History: 1 Wears glasses Patient Visual Report: No change from baseline Tracking/Visual Pursuits: Decreased smoothness of horizontal tracking;Decreased smoothness of vertical tracking Saccades: Decreased speed of saccadic movement Perception  Perception: Impaired Inattention/Neglect: Does not attend to right side of body Praxis Praxis: Impaired Praxis Impairment Details: Motor planning Cognition Cognition Overall Cognitive Status: History of cognitive impairments - at baseline Arousal/Alertness: Awake/alert Orientation Level: Person Brief Interview for Mental Status (BIMS) Repetition of Three Words (First Attempt): 3 Temporal Orientation: Year: No answer Temporal Orientation: Month: Missed by more than 1 month Temporal Orientation: Day: Incorrect Recall: "Sock": No, could not recall Recall: "Blue": No, could not recall Recall: "Bed": No, could not recall BIMS Summary Score: 3 Sensation Sensation Light Touch: Impaired by gross assessment Hot/Cold: Appears Intact Proprioception: Impaired by gross assessment Stereognosis: Not tested Coordination Gross Motor Movements are Fluid and Coordinated: No Finger Nose Finger Test: able to do on right side with slow movement pattern for 3 reps Motor  Motor Motor: Hemiplegia;Abnormal tone;Motor apraxia;Ataxia Motor - Discharge Observations: pt has slightly improved R side AROM with lifting and bending arm.  has light grasp Mobility    Pt can use a slide board with mod -max A of 2 or a hoyer lift with 2 person A Trunk/Postural Assessment  Postural Control Trunk  Control: decreased Righting Reactions: decreased Protective Responses: delayed  Balance Static Sitting Balance Static Sitting - Level of Assistance: 5: Stand by assistance Dynamic Sitting  Balance Dynamic Sitting - Level of Assistance: 3: Mod assist Static Standing Balance Static Standing - Level of Assistance: Not tested (comment) Dynamic Standing Balance Dynamic Standing - Level of Assistance: Not tested (comment) Dynamic Standing - Comments: pt no longer able to stand due to severe pain in B knees Extremity/Trunk Assessment RUE Assessment Active Range of Motion (AROM) Comments: sh flex to 90, full elbow flexion, fair grasp to pick up unweighted objects.  Has intermittient wrist pain so using wrist cock up splint. Also has thumb pain.  Recommending daughter use neoprane thumb spica splint and gave her suggestions on where to purchase. LUE Assessment General Strength Comments: decreased strength d/t general deconditioning FAST-UL Outcome Measure (RUE)  Hand-to-mouth (HtM) Movement Starting Position: Participant seated on a standard chair without armrests. Trunk leaning on back support of chair. Both hands placed in pronated position on the ipsilateral middle thigh. Feet placed flat on the floor. If participants have any difficulty in understanding instructions (i.e. aphasia) a visual demonstration is suggested. For each of the 5 tasks of the FAST-UL, the subject at first performs the movement with the less affected UL and then with the affected one. The movement can be repeated 3 times and the best score of the three attempts is assigned.   Instructions: Each subject is asked to move the hand towards the mouth, touch it with fingertips and return to the thigh. Motor task occurs without moving the trunk off the back support and without moving the head toward the hand.   Scoring: Clinical score from 0 to 3 is provided by comparing affected side with less affected one as follows: 0 = no  movement at all. 1 = The movement task is not completed (less of 50% of the contralateral HtM movement). 2 = The movement task is not completed (more of 50% of the contralateral HtM movement but the mouth is not reached) or the movement task is completed with compensations. If the mouth is touched with the wrist or the palm or the movement is performed with head or trunk compensations (flexion of the head and trunk towards the hand) the score is 2.   3 = movement carried out at 100% of the contralateral HtM movement. HtM occurs with adequate shoulder flexion and abduction, elbow flexion, and forearm supination. The mouth is touched with fingertips.  Patient Score: 3   Reach to Target (RtT) Movement Starting Position: Same starting conditions of HtM movement. Instructions: Each subject is asked to move the hand toward a target (i.e. the hand of the examiner) located in front of the subject in the ipsilateral workspace at shoulder height, at a distance corresponding to 100% of the fully extended UL within arm's reach (less affected arm as reference). Participants have to reach, touch the target, and return. Motor task occurs without moving the trunk off the back support. Scoring: Clinical score from 0 to 3 is provided by comparing affected side with less affected one as follows: 0 = no movement at all. 1 = The movement task is not completed (less of 50% of the contralateral RtT movement).  2 = The movement task is not completed (more of 50% of the contralateral RtT movement but the target is not reached) or the movement task is completed with compensations (i.e. the trunk loses contact with the back support of the chair with forward displacement, shoulder flexion occurs with excessive scapular elevation, or shoulder excessive abduction). If the target is reached with trunk or shoulder compensations for inadequate elbow  and finger extension the score is 2.  3 = movement performed at 100% of the  contralateral RtT. The target is reached with adequate shoulder flexion, elbow, wrist and finger extension.  Patient Score: 2   Prono-supination (PS) Movement Starting Position: Same starting conditions of HtM movement. Instructions: Motor task occurs without moving the trunk anteriorly or laterally, the medial side of the humerus is against the body, the forearm is fully pronated with the hand resting on the thigh. Scoring: Clinical score from 0 to 3 is provided by comparing paretic side with less affected one as follows: 0 = no movement at all. 1 = The movement task is not completed (less of 50% of the contralateral PS movement).  2 = The movement task is not completed (more of 50% of the contralateral PS movement but the forearm is not fully supinated) or the movement task is completed with compensations (i.e. excessive trunk inclination, shoulder abduction). If the movement is completed with compensations at elbow, shoulder or trunk level the score is 2. 3 = movement performed at 100% of the contralateral PS (complete supination of the forearm with the dorsal part of the hand in contact with the thigh).   Patient Score: 3   Grasp and Release (GaR) Movement Starting position: Participant seated on a standard chair. Hip and knees in 90 flexion, feet flat on the floor. Upper limb (UL) resting on a table in front of the participant with approximately 90 elbow flexion, forearm pronated and fingers in a relaxed extended and adducted position.  Instructions: The subject performs a grasping movement of a cylindrical rigid glass (at least 6 cm diameter) placed proximally to an imaginary line connecting the distal joints of thumb and index finger. The subject is asked to grasp the glass, lift it at least 2 cm (elbow remains in contact with the table), and release it. Scoring:  Clinical score from 0 to 3 is provided by comparing affected side with less affected one as follows: 0 = No movement. The  grasp is not possible. 1 = The movement task is not completed (less of 50% of the task). Some prehension is possible but the grasp is not sufficiently stable to lift the object; the grasp can be performed with the use of the less affected hand only to stabilize the glass for inadequate hand/finger opening and the release is not possible. Some hand opening is required otherwise the score is 0. 2 = The movement task is not completed (more of 50% of the task). The object is grasped and lifted but it falls or the task is completed using alternative grasping strategies (i.e. multi-pulpar, palmar, digito palmar; grasping and releasing of the object is possible with abnormal orientation of the wrist and fingers toward the object and the forearm is lifted off the table). 3 = The task is completed using the expected pattern (normal orientation of fingers or wrist toward the object, the grasp occurs with thumb and fingers in opposition, forearm supination, elbow flexion; thumb abduction and finger extension to release the object).  Patient Score: 2   Pinch and Release (PaR) Movement Starting position: Same starting conditions of GaR movement The participant performs a PaR movement of a pen placed on a table in the midline of an imaginary line connecting the distal joints of thumb and index finger. The participants asked to pinch the pen with the tips of thumb and index finger, lift it at least 2 cm (elbow remains in contact with the table), and  release it. Clinical score from 0 to 3 is provided by comparing affected side with non-affected one as follows: 0 = No movement. The pinch is not possible. 1 = The movement task is not completed (less of 50% of the task). Some prehension is possible but the pinch is not sufficiently stable to lift the object; the pinch occurs with the use of the less affected hand to stabilize the object for inadequate finger opening and the release is not possible. Some fingers movement  is required otherwise the score is 0. 2 = The movement task is not completed (more of 50% of the task). The object is pinched and lifted but it falls or the task is completed using alternative pinching strategies (e.g. pinching with all the fingers, tripod pinch, pinching and releasing of the object is possible with abnormal orientation of fingers and wrist toward the object and the forearm is lifted off the table). 3 = The task is completed using the expected pattern (normal orientation of fingers or wrist toward the object, the pinch occurs with opposition of pads of index finger and thumb, and wrist extension).  Patient Score: 1  Total score: 11/15  (9/15 on admission)   [place] 04/04/2022, 12:47 PM

## 2022-04-04 NOTE — Progress Notes (Signed)
Physical Therapy Discharge Summary  Patient Details  Name: Bridget [name] MRN: [id number] Date of Birth: 07-14-1921  Date of Discharge from PT service:April 04, 2022  Today's Date: 04/04/2022 PT Individual Time: [id number] PT Individual Time Calculation (min): 76 min    Patient has met 6 of 6 long term goals due to improved activity tolerance, improved balance, improved postural control, and ability to compensate for deficits.  Patient to discharge at a wheelchair level [place].   Patient's care partner is independent to provide the necessary physical and cognitive assistance at discharge. Pt's daughter and great-granddaughter have attended multiple family educations, and have engaged in hands-on transfers to ensure their independence upon D/C. Daughter and grand-daughter have shown ability to safely transfer pt w/ Mod I for increased time and planning.  Reasons goals not met: N/A  Recommendation:  Patient will benefit from ongoing skilled PT services in home health setting to continue to advance safe functional mobility, address ongoing impairments in functional transfers, weightbearing tolerance, LE NMR, and minimize fall risk.  Equipment: Slide board, [employer] fast streamer w/ sling, [facility]   Reasons for discharge: treatment goals met and discharge from hospital  Patient/family agrees with progress made and goals achieved: Yes  Skilled PT Therapeutic Interventions  Pt received sitting upright in [facility]: [place] Rhonda W w/ daughter Kennyth Lose present and agreeable to PT services. Pt's grad day assessment focused on functional transfers. Pt's daughter expressed the need to practice transfers from bed to [facility] in order for the pt to use the restroom, using the [place] and [employer]. Pt performed L slide board transfer from the WC to EOB w/ Mod A +2. At EOB pt transferred to supine w/ Mod A for LE management. In supine pt performed a bridge w/ Mod A for cueing to lift hips and providing stability in LE to maintain position.  Pt's daughter assisted in remove LE garments. Pt rolled to the L and R w/ Mod A and mod vc for sequence. Sling placed under pt by daughter. Pt then attached to hoyer lift in order for her head to be slightly above her LE Then transferred from bed to sit at [facility]. While on [facility] pt urinated and had a bowel movement. Pt required total assist for peri-care. Verbal cueing for pt to lean shoulders forward to un-weight her backside so daughter could assist in cleaning the pt. R Lateral transfer using slide board from [facility] to EOB was performed w/ Mod A +2. Sit<>supine Mod A +1 and Mod A+2 to perform bridge in order to redress LE garments. Supine <>sit at EOB w/ Mod A +2 for trunk and LE management. R lateral transfer from EOB to [facility] w/  Mod A +2. Pt's daughter has expressed that she feels more confident w/ transfer. Daughter has shown ability to safely transfer pt w/ Mod I for increased time and planning. Pt has shown functional improvement w/ slide board transfers, requiring less verbal cues for sequencing.   Pt was left sitting in [facility] w/ daughter present and all needs met.   PT Discharge Precautions/Restrictions Precautions Precautions: Fall Restrictions Weight Bearing Restrictions: No Vital Signs Therapy Vitals Temp: 98.2 F (36.8 C) Temp Source: Oral Pulse Rate: 75 Resp: 18 BP: (!) 109/50 Patient Position (if appropriate): Sitting Oxygen Therapy SpO2: 97 % O2 Device: Room Air Pain Pain Assessment Pain Scale: 0-10 Pain Score: 0-No pain Pain Interference Pain Interference Pain Effect on Sleep: 1. Rarely or not at all Pain Interference with Therapy Activities: 1. Rarely or not at  all Pain Interference with Day-to-Day Activities: 1. Rarely or not at all Vision/Perception  Vision - History Ability to See in Adequate Light: 2 Moderately impaired Vision - Assessment Tracking/Visual Pursuits: Decreased smoothness of horizontal tracking;Decreased smoothness of vertical tracking Saccades: Decreased  speed of saccadic movement Perception Perception: Impaired Inattention/Neglect: Does not attend to right side of body Praxis Praxis: Impaired Praxis Impairment Details: Motor planning  Cognition Overall Cognitive Status: History of cognitive impairments - at baseline Arousal/Alertness: Awake/alert Orientation Level: Oriented to person Sensation Sensation Light Touch: Impaired by gross assessment Hot/Cold: Appears Intact Proprioception: Impaired by gross assessment Stereognosis: Not tested Coordination Gross Motor Movements are Fluid and Coordinated: No Fine Motor Movements are Fluid and Coordinated: No Coordination and Movement Description: increased motor initiation time Finger Nose Finger Test: able to do on right side with slow movement pattern for 3 reps Motor  Motor Motor: Hemiplegia;Abnormal tone;Motor apraxia;Ataxia Motor - Discharge Observations: Still does not tolerate standing due to B knee pain, transfers have improved functionally  Mobility Bed Mobility Bed Mobility: Rolling Right;Rolling Left Rolling Right: Moderate Assistance - Patient 50-74% Rolling Left: Maximal Assistance - Patient 25-49% Supine to Sit: Moderate Assistance - Patient 50-74% Transfers Transfers: Lateral/Scoot Transfers Sit to Stand: Moderate Assistance - Patient 50-74% (+2) Stand to Sit: Maximal Assistance - Patient 25-49% Squat Pivot Transfers: Dependent - Patient 0% Lateral/Scoot Transfers: Moderate Assistance - Patient 50-74% Transfer via Lift Equipment:  Optician, dispensing) Locomotion  Gait Ambulation: No Gait Gait: No Stairs / Additional Locomotion Stairs: No Pick up small object from the floor (from standing position) activity did not occur: Safety/medical Producer, television/film/video Mobility: Yes Wheelchair Assistance: Dependent - Patient 0% Wheelchair Parts Management: Needs assistance  Trunk/Postural Assessment  Cervical Assessment Cervical Assessment: Exceptions to  [facility] (forward head) Thoracic Assessment Thoracic Assessment: Exceptions to [facility] (kyphotic posture) Lumbar Assessment Lumbar Assessment: Exceptions to [facility] (posterior pelvic tilt) Postural Control Postural Control: Deficits on evaluation Trunk Control: decreased Righting Reactions: decreased Protective Responses: delayed  Balance Balance Balance Assessed: (P) Yes Static Sitting Balance Static Sitting - Balance Support: (P) Feet supported Static Sitting - Level of Assistance: (P) 5: Stand by assistance Dynamic Sitting Balance Dynamic Sitting - Balance Support: (P) Feet supported Dynamic Sitting - Level of Assistance: (P) 3: Mod assist;4: Min assist Static Standing Balance Static Standing - Level of Assistance: (P) Not tested (comment) Dynamic Standing Balance Dynamic Standing - Level of Assistance: (P) Not tested (comment) Dynamic Standing - Comments: pt no longer able to stand due to severe pain in B knees Extremity Assessment  RUE Assessment Active Range of Motion (AROM) Comments: sh flex to 90, full elbow flexion, fair grasp to pick up unweighted objects.  Has intermittient wrist pain so using wrist cock up splint. Also has thumb pain.  Recommending daughter use neoprane thumb spica splint and gave her suggestions on where to purchase. LUE Assessment General Strength Comments: decreased strength d/t general deconditioning RLE Assessment RLE Assessment: Exceptions to [facility] RLE Strength RLE Overall Strength: Deficits Right Hip Flexion: 3-/5 Right Hip ABduction: 3/5 Right Hip ADduction: 4-/5 Right Knee Flexion: 3/5 Right Knee Extension: 3+/5 Right Ankle Dorsiflexion: 3/5 LLE Assessment LLE Assessment: Exceptions to [facility] LLE Strength LLE Overall Strength: Deficits Left Hip Flexion: 3+/5 Left Hip ABduction: 4/5 Left Hip ADduction: 4/5 Left Knee Flexion: 4-/5 Left Knee Extension: 4-/5 Left Ankle Dorsiflexion: 4-/5   Jamarrius Salay 04/04/2022, 3:46 PM

## 2022-04-04 NOTE — Plan of Care (Cosign Needed)
  Problem: RH Balance Goal: LTG Patient will maintain dynamic sitting balance (PT) Description: LTG:  Patient will maintain dynamic sitting balance with assistance during mobility activities (PT) Outcome: Completed/Met   Problem: RH Bed Mobility Goal: LTG Patient will perform bed mobility with assist (PT) Description: LTG: Patient will perform bed mobility with assistance, with/without cues (PT). Outcome: Completed/Met   Problem: RH Bed to Chair Transfers Goal: LTG Patient will perform bed/chair transfers w/assist (PT) Description: LTG: Patient will perform bed to chair transfers with assistance (PT). Outcome: Completed/Met   Problem: RH Wheelchair Mobility Goal: LTG Patient will propel w/c in controlled environment (PT) Description: LTG: Patient will propel wheelchair in controlled environment, # of feet with assist (PT) Outcome: Completed/Met Flowsheets (Taken 04/04/2022 1536) LTG: Pt will propel w/c in controlled environ  assist needed:: Minimal Assistance - Patient > 75% Goal: LTG Patient will propel w/c in home environment (PT) Description: LTG: Patient will propel wheelchair in home environment, # of feet with assistance (PT). Outcome: Completed/Met Flowsheets (Taken 04/04/2022 1536) LTG: Pt will propel w/c in home environ  assist needed:: Minimal Assistance - Patient > 75%   Problem: RH Pre-functional/Other (Specify) Goal: RH LTG PT (Specify) 1 Description:  RH LTG PT (Specify) 1 Outcome: Completed/Met

## 2022-04-04 NOTE — Progress Notes (Signed)
Occupational Therapy Session Note  Patient Details  Name: Bridget [name] MRN: [id number] Date of Birth: Aug 31, 1921  Today's Date: 04/04/2022 OT Individual Time: [id number] OT Individual Time Calculation (min): 75 min    Short Term Goals: Week 2:  OT Short Term Goal 1 (Week 2): STGs = LTGs  Skilled Therapeutic Interventions/Progress Updates: Pain: intermittent c/o pain with being touched   Pt received in bed with daughter present. Great granddtr had to leave to go to work.   Had pt work on rolling in bed with daughter actively involved. Removed purewick and pt had small amount of bowel.  Pt was cleansed.  Pt had new brief and pants donned from bed.  Pt felt she had to toilet more, so gave her time to go but unable to void more.  Moved pt to EOB showing daughter easiest way to manage her legs.  Pt sat up and worked on UB self care.  Used slide board to transfer to wc with only mod A using her arm well to help push her hips along board as daughter helped to stabilize the board.  Pt settled in wc with all needs met. See ADL documentation below.   Therapy Documentation Precautions:  Precautions Precautions: Fall Precaution Comments: watch HR, HOH, R-hemi, Restrictions Weight Bearing Restrictions: No     ADL: ADL Eating: Minimal assistance, Minimal cueing Where Assessed-Eating: Bed level Grooming: Minimal assistance, Minimal cueing Where Assessed-Grooming: Wheelchair Upper Body Bathing: Moderate assistance Where Assessed-Upper Body Bathing: Edge of bed Lower Body Bathing: Maximal assistance Where Assessed-Lower Body Bathing: Bed level Upper Body Dressing: Maximal assistance Where Assessed-Upper Body Dressing: Edge of bed Lower Body Dressing: Dependent Where Assessed-Lower Body Dressing: Bed level Toileting: Dependent Where Assessed-Toileting: Other (Comment) (bedpan or BSC) Toilet Transfer: Dependent Toilet Transfer Method: Teacher, adult education, Other (comment) (or hoyer if using  BSC) Science writer: Drop arm bedside commode Tub/Shower Transfer: Not assessed Social research officer, government: Not assessed Social research officer, government Method: Education officer, environmental: Shower seat with back   Therapy/Group: Individual Therapy  [place] 04/04/2022, 12:29 PM

## 2022-04-04 NOTE — Plan of Care (Signed)
  Problem: RH Balance Goal: LTG: Patient will maintain dynamic sitting balance (OT) Description: LTG:  Patient will maintain dynamic sitting balance with assistance during activities of daily living (OT) Outcome: Adequate for Discharge   Problem: RH Dressing Goal: LTG Patient will perform upper body dressing (OT) Description: LTG Patient will perform upper body dressing with assist, with/without cues (OT). Outcome: Adequate for Discharge   Problem: RH Grooming Goal: LTG Patient will perform grooming w/assist,cues/equip (OT) Description: LTG: Patient will perform grooming with assist, with/without cues using equipment (OT) Outcome: Completed/Met   Problem: RH Bathing Goal: LTG Patient will bathe all body parts with assist levels (OT) Description: LTG: Patient will bathe all body parts with assist levels (OT) Outcome: Completed/Met   Problem: RH Dressing Goal: LTG Patient will perform lower body dressing w/assist (OT) Description: LTG: Patient will perform lower body dressing with assist, with/without cues in positioning using equipment (OT) Outcome: Completed/Met   Problem: RH Toileting Goal: LTG Patient will perform toileting task (3/3 steps) with assistance level (OT) Description: LTG: Patient will perform toileting task (3/3 steps) with assistance level (OT)  Outcome: Completed/Met   Problem: RH Functional Use of Upper Extremity Goal: LTG Patient will use RT/LT upper extremity as a (OT) Description: LTG: Patient will use right/left upper extremity as a stabilizer/gross assist/diminished/nondominant/dominant level with assist, with/without cues during functional activity (OT) Outcome: Completed/Met   Problem: RH Toilet Transfers Goal: LTG Patient will perform toilet transfers w/assist (OT) Description: LTG: Patient will perform toilet transfers with assist, with/without cues using equipment (OT) Outcome: Completed/Met

## 2022-04-05 NOTE — Progress Notes (Signed)
PROGRESS NOTE   Subjective/Complaints:  No concerns today, ready to go home. Slept well. LBM was 2 days ago. Urinating well. Denies pain. No other complaints or concerns.   ROS- limited by cognition  Objective:   No results found. No results for input(s): "WBC", "HGB", "HCT", "PLT" in the last 72 hours.  No results for input(s): "NA", "K", "CL", "CO2", "GLUCOSE", "BUN", "CREATININE", "CALCIUM" in the last 72 hours.    Intake/Output Summary (Last 24 hours) at 04/05/2022 1127 Last data filed at 04/05/2022 0500 Gross per 24 hour  Intake 140 ml  Output 1000 ml  Net -860 ml        Physical Exam: Vital Signs Blood pressure (!) 144/77, pulse 73, temperature 98.8 F (37.1 C), temperature source Oral, resp. rate 16, height 5\' 5"  (1.651 m), weight 58.5 kg, SpO2 96 %.   General: No acute distress, sitting in bed, a little irritable this morning but less than prior days, mostly in better spirits Heart: Regular rate and rhythm no rubs murmurs or extra sounds Lungs: Clear to auscultation, breathing unlabored, no rales or wheezes Abdomen: Positive bowel sounds, soft, very mildly tender to palpation throughout but no significant or focal tenderness and seems to resolve with distraction similar to prior exams, nondistended, no rebound/guarding/rigidity Extremities: No clubbing, cyanosis, or edema Skin: No evidence of breakdown, no evidence of rash  PRIOR EXAM: Neurologic: Cranial nerves II through XII intact, motor strength is 5/5 in left  deltoid, bicep, tricep, grip, hip flexor, knee extensors, ankle dorsiflexor and plantar flexor 3- Right delt, biceps, 3- wrist and finger flexors and extensor 3- Right HF, KE, ADF  Musculoskeletal: Full range of motion in all 4 extremities. Right wrist drop no pain with ROM , no wrist effusion mild pain with sup/pronation, mild pain to palp over R ulnar styloid    Assessment/Plan: 1. Functional  deficits which require 3+ hours per day of interdisciplinary therapy in a comprehensive inpatient rehab setting. Physiatrist is providing close team supervision and 24 hour management of active medical problems listed below. Physiatrist and rehab team continue to assess barriers to discharge/monitor patient progress toward functional and medical goals  Care Tool:  Bathing    Body parts bathed by patient: Chest, Right arm, Face, Front perineal area, Abdomen   Body parts bathed by helper: Left arm, Buttocks, Right upper leg, Left upper leg, Right lower leg, Left lower leg     Bathing assist Assist Level: Maximal Assistance - Patient 24 - 49%     Upper Body Dressing/Undressing Upper body dressing   What is the patient wearing?: Pull over shirt, Button up shirt    Upper body assist Assist Level: Maximal Assistance - Patient 25 - 49%    Lower Body Dressing/Undressing Lower body dressing      What is the patient wearing?: Pants, Underwear/pull up     Lower body assist Assist for lower body dressing: Total Assistance - Patient < 25%     Toileting Toileting Toileting Activity did not occur (Clothing management and hygiene only): N/A (no void or bm)  Toileting assist Assist for toileting: Total Assistance - Patient < 25%     Transfers Chair/bed transfer  Transfers assist     Chair/bed transfer assist level: Moderate Assistance - Patient 50 - 74%     Locomotion Ambulation   Ambulation assist   Ambulation activity did not occur: Safety/medical concerns          Walk 10 feet activity   Assist  Walk 10 feet activity did not occur: Safety/medical concerns        Walk 50 feet activity   Assist Walk 50 feet with 2 turns activity did not occur: Safety/medical concerns         Walk 150 feet activity   Assist Walk 150 feet activity did not occur: Safety/medical concerns         Walk 10 feet on uneven surface  activity   Assist Walk 10 feet on  uneven surfaces activity did not occur: Safety/medical concerns         Wheelchair     Assist Is the patient using a wheelchair?: Yes Type of Wheelchair: Manual    Wheelchair assist level: Minimal Assistance - Patient > 75% Max wheelchair distance: 34ft    Wheelchair 50 feet with 2 turns activity    Assist        Assist Level: Minimal Assistance - Patient > 75%   Wheelchair 150 feet activity     Assist      Assist Level: Dependent - Patient 0%   Blood pressure (!) 144/77, pulse 73, temperature 98.8 F (37.1 C), temperature source Oral, resp. rate 16, height 5\' 5"  (1.651 m), weight 58.5 kg, SpO2 96 %.  Medical Problem List and Plan: 1. Functional deficits secondary to acute infarcts in the left precentral gyrus and left posterior frontal lobe likely embolic in the setting of A-fib -Right wrist pain due to CVA related weakness, discussed with OT order RIght wrist cock up splint , less pain as motor strength improves              -patient may  shower             -d/c today 04/05/22  -Continue CIR, PT OT, family training in progress - plan is to do both Engineer, drilling and [employer] transfers depending on pt alertness that day   2.  Antithrombotics: -DVT/anticoagulation:  Pharmaceutical: Eliquis according to pharmacy given age and GFR, will use 2.5mg  BID              -antiplatelet therapy: N/A 3. Pain Management: Tylenol as needed, muscle rub cream PRN -Hx of endstage OA of knees gets gel and cortisone injections last performed ~3wks ago per daughter 4. Mood/Behavior/Sleep: Namenda 10 mg twice daily, melatonin 3mg  QHS PRN             -antipsychotic agents: N/A  -04/05/22 sleeping well  5. Neuropsych/cognition: This patient is not capable of making decisions on her own behalf. Has moderate dementia- needs to be reminded everyday that she has suffered a stroke , appears surprised everyday  6. Skin/Wound Care: Routine skin checks 7. Fluids/Electrolytes/Nutrition: Routine  in and outs with follow-up chemistries   -Cont Vit D3 400IU daily 8.  Atrial fibrillation with RVR LAA thrombus.  Continue Eliquis, Cardizem 360 mg daily.  Cardiac rate controlled 04/05/22 9.  UTI/Proteus Mirabilis.  Completed  course of Rocephin -03/23/22 family concerned with urine still being amber; encourage PO fluids, monitor for cloudiness or UTI symptoms/behavior changes -04/05/22 no mention of concerns of urine color or UTI symptoms, continue monitoring at home, continue Nitrofurantoin 50mg  QHS 10.  Hyperlipidemia.  Lipitor  80mg  QD 11.  Essential hypertension.  Continue Cardizem as directed  -04/05/22 adequate control Vitals:   04/01/22 0642 04/01/22 1300 04/01/22 1951 04/02/22 0529  BP: (!) 159/74 130/65 132/87 (!) 142/68   04/02/22 1358 04/02/22 1942 04/03/22 0518 04/03/22 1712  BP: 122/60 (!) 143/73 134/61 124/64   04/03/22 1957 04/04/22 1428 04/04/22 1948 04/05/22 0709  BP: (!) 137/59 (!) 109/50 118/65 (!) 144/77    12.  Diabetes mellitus.  Diet controlled at baseline.  Hemoglobin A1c 5.6. 13.  Chronic bronchitis.  Continue Dulera and Incruse inhalers as indicated 14. Urinary incontinence: This is chronic, daughter has purchased pure wick for home use. 15. Constipation with incontinence- suggest Sorbitol once seen therapy, so can get her on [facility] -takes Colace and Senokot at home; ordered Colace 100mg  QD, Senokot 1tab QD, and Sorbitol 48ml PRN, monitor>>increased Colace to BID -04/05/22 LBM 2 days ago, cont regimen and monitor at home   LOS: 16 days A FACE TO [place] 04/05/2022, 11:27 AM

## 2022-04-07 ENCOUNTER — Telehealth: Payer: Self-pay | Admitting: *Deleted

## 2022-04-07 NOTE — Telephone Encounter (Signed)
Transition Care Management Unsuccessful Follow-up Telephone Call  Date of discharge and from where:  04/05/22  Attempts:  1st Attempt  Reason for unsuccessful TCM follow-up call:  Spoke with patient's daughter Elsie Amis who will call back.

## 2022-04-15 ENCOUNTER — Encounter: Payer: Medicare Other | Admitting: Registered Nurse

## 2022-04-15 ENCOUNTER — Encounter: Payer: Self-pay | Admitting: Registered Nurse

## 2022-04-15 ENCOUNTER — Encounter: Payer: Medicare Other | Attending: Registered Nurse | Admitting: Registered Nurse

## 2022-04-15 VITALS — BP 119/68 | HR 74 | Ht 65.0 in | Wt 138.4 lb

## 2022-04-15 DIAGNOSIS — I4891 Unspecified atrial fibrillation: Secondary | ICD-10-CM | POA: Insufficient documentation

## 2022-04-15 DIAGNOSIS — I10 Essential (primary) hypertension: Secondary | ICD-10-CM

## 2022-04-15 DIAGNOSIS — I639 Cerebral infarction, unspecified: Secondary | ICD-10-CM | POA: Diagnosis present

## 2022-04-15 NOTE — Progress Notes (Signed)
Subjective:    Patient ID: Bridget [name], female    DOB: [birth year], 89 y.o.   MRN: [id number]  HPI: Bridget [name] is a 89 y.o. female who is here for HFU appointment for F/U of her Ischemic Stroke of frontal lobe, Atrial Fibrillation with RVR . She presented to [facility] ED on 03/15/2022 via EMS.  H&P Dr. Jannifer Hick is a 89 y.o. female with medical history significant for dementia requiring 24/7 assistance, permanent atrial fibrillation on Eliquis, thrombus of atrial appendage, prior history of stroke with no residual deficits, history of breast cancer status post lumpectomy, osteoporosis and bronchiectasis, who presented to [facility] ED from home as a code stroke.  The patient was noted to have right facial drooling and dysarthria.  Reportedly onset of symptoms about 4 to 5 hours prior to arrival.  Last known well was 2130.  The patient is anticoagulated on Eliquis.  EMS was activated.  Upon EMS arrival, reported right-sided droop initially.  Associated with slurred speech.    CT Head: WO Contrast:  IMPRESSION: 1. No acute intracranial process. 2. ASPECTS is 10.  CTA:  IMPRESSION: 1. No intracranial large vessel occlusion. Severe stenosis in the left A2 segment. 2. No hemodynamically significant stenosis in the neck.  MR: Brain: WO Contrast IMPRESSION: Acute infarcts in the left posterior frontal cortex, including the precentral gyrus.   Additional smaller subacute infarcts in the more anterior left frontal lobe.    Bridget [name] was admitted to inpatient rehabilitation on 03/20/2022 and discharged home on 04/05/2022. She is receiving Home Health Therapy with [facility]. Bridget [name] shakes her head no for any pain. Her pain is rated 0.   Daughter In room.     Pain Inventory Average Pain 0 Pain Right Now 0 My pain is intermittent  LOCATION OF PAIN  neck  BOWEL Number of stools per week: 3-4 Oral laxative use Yes  Type of laxative senna Enema or suppository use No  History  of colostomy No  Incontinent Yes   BLADDER Normal and Pads  Pure Wick at night  Mobility how many minutes can you walk? Goal is to get her to stand and take a few steps with rollator ability to climb steps?  no do you drive?  no use a wheelchair needs help with transfers  Function retired I need assistance with the following:  dressing, bathing, toileting, meal prep, household duties, and shopping  Neuro/Psych confusion - dementia  Prior Studies Any changes since last visit?  no will see her PCP tomorrow 04/16/22  Physicians involved in your care Any changes since last visit?  no   Family History  Problem Relation Age of Onset   Other Sister        TRAUMA TO HEAD AFTER A FALL   Social History   Socioeconomic History   Marital status: Widowed    Spouse name: Not on file   Number of children: 3   Years of education: Not on file   Highest education level: Not on file  Occupational History   Not on file  Tobacco Use   Smoking status: Former    Packs/day: 1.50    Years: 7.00    Additional pack years: 0.00    Total pack years: 10.50    Types: Cigarettes    Quit date: 52    Years since quitting: 74.3   Smokeless tobacco: Never  Vaping Use   Vaping Use: Never used  Substance and Sexual Activity  Alcohol use: Never   Drug use: Never   Sexual activity: Not Currently  Other Topics Concern   Not on file  Social History Narrative   Daughter [name] accompanies patient to her appointments.   Social Determinants of Health   Financial Resource Strain: Not on file  Food Insecurity: Not on file  Transportation Needs: Not on file  Physical Activity: Not on file  Stress: Not on file  Social Connections: Not on file   Past Surgical History:  Procedure Laterality Date   BREAST LUMPECTOMY Left 2001   FEMUR FRACTURE SURGERY Right 2019   RIGHT - 2015   TOTAL ABDOMINAL HYSTERECTOMY  1967   Past Medical History:  Diagnosis Date   Atherosclerosis of aorta     Atrial fibrillation    Breast cancer    Chronic Bronchitis    Coronary atherosclerosis due to calcified coronary lesion    Nocturnal polyuria    Stroke    Thrombus of left atrial appendage    BP 119/68   Pulse 74   Ht 5\' 5"  (1.651 m)   Wt 138 lb 7.2 oz (62.8 kg) Comment: last recorded  SpO2 93%   BMI 23.04 kg/m   Opioid Risk Score:   Fall Risk Score:  `1  Depression screen [facility] 2/9     04/15/2022   12:57 PM  Depression screen PHQ 2/9  Decreased Interest 0  Down, Depressed, Hopeless 0  PHQ - 2 Score 0  Altered sleeping 0  Tired, decreased energy 0  Change in appetite 0  Feeling bad or failure about yourself  0  Trouble concentrating 0  Moving slowly or fidgety/restless 0  Suicidal thoughts 0  PHQ-9 Score 0    Review of Systems  Constitutional: Negative.   HENT: Negative.         HOH. Has hearing aid in R ear left one is broken  Eyes: Negative.   Respiratory: Negative.    Cardiovascular:  Positive for leg swelling.  Gastrointestinal: Negative.   Endocrine: Negative.   Genitourinary:  Positive for enuresis.  Musculoskeletal:  Positive for gait problem.  Skin: Negative.   Neurological:  Positive for weakness.  Hematological:  Bruises/bleeds easily.       Eliquis  Psychiatric/Behavioral:  Positive for confusion.        Dementia  All other systems reviewed and are negative.      Objective:   Physical Exam Vitals and nursing note reviewed.  Constitutional:      Comments: Bridget [name] : Alert x 1: History Dementia  Cardiovascular:     Rate and Rhythm: Normal rate and regular rhythm.     Pulses: Normal pulses.     Heart sounds: Normal heart sounds.  Pulmonary:     Effort: Pulmonary effort is normal.     Breath sounds: Normal breath sounds.  Musculoskeletal:     Cervical back: Normal range of motion and neck supple.     Comments: Normal Muscle Bulk and Muscle Testing Reveals:  Upper Extremities: Full ROM and Muscle Strength on Right 4/5 and Left 5/5  Lower  Extremities: Full ROM and Muscle Strength 5/5 Arrived in Wheelchair      Skin:    General: Skin is warm and dry.  Neurological:     Mental Status: She is oriented to person, place, and time.  Psychiatric:        Mood and Affect: Mood normal.        Behavior: Behavior normal.  Assessment & Plan:  Ischemic Stroke of frontal lobe: Continue Home Health Therapy with [facility]. She has a Neurology Appointment. Continue to monitor.   Atrial Fibrillation with RVR : Continue Eliquis. Cardiology following. Continue current medication regimen. Continue to monitor.  History Dementia: Neurology Following. Continue current medication regimen.  Continue to Monitor.   F/U with Dr Wynn BankerKirsteins in 4- 6 weeks

## 2022-04-21 ENCOUNTER — Ambulatory Visit: Payer: Medicare Other | Admitting: Cardiology

## 2022-04-21 ENCOUNTER — Encounter: Payer: Self-pay | Admitting: Cardiology

## 2022-04-21 VITALS — BP 115/52 | HR 71 | Resp 16 | Ht 65.0 in

## 2022-04-21 DIAGNOSIS — I4821 Permanent atrial fibrillation: Secondary | ICD-10-CM

## 2022-04-21 DIAGNOSIS — Z7901 Long term (current) use of anticoagulants: Secondary | ICD-10-CM

## 2022-04-21 DIAGNOSIS — I639 Cerebral infarction, unspecified: Secondary | ICD-10-CM

## 2022-04-21 DIAGNOSIS — I2510 Atherosclerotic heart disease of native coronary artery without angina pectoris: Secondary | ICD-10-CM

## 2022-04-21 DIAGNOSIS — Z87891 Personal history of nicotine dependence: Secondary | ICD-10-CM

## 2022-04-21 DIAGNOSIS — I700 Atherosclerosis of aorta: Secondary | ICD-10-CM

## 2022-04-21 DIAGNOSIS — I513 Intracardiac thrombosis, not elsewhere classified: Secondary | ICD-10-CM

## 2022-04-21 DIAGNOSIS — E7800 Pure hypercholesterolemia, unspecified: Secondary | ICD-10-CM

## 2022-04-21 MED ORDER — APIXABAN 5 MG PO TABS: 5.0000 mg | ORAL_TABLET | Freq: Two times a day (BID) | ORAL | 0 refills | Status: DC

## 2022-04-21 NOTE — Progress Notes (Signed)
ID:  Bridget [name], DOB Jul 07, 1921, MRN [id number]  PCP:  Gaspar Garbe, MD  Cardiologist:  Tessa Lerner, DO, [facility]  (established care 08/28/2020)  Date: 04/21/22 Last Office Visit: 12/10/2021  Chief Complaint  Patient presents with   Permanent atrial fibrillation [facility])   Follow-up    HPI  Bridget [name] is a 89 y.o. female whose past medical history and cardiovascular risk factors include: Atrial fibrillation (permanent, per daughter), hx of stroke (2017), s/p stroke Left posterior frontal cortex, including the precentral gyrus & smaller subacute infarcts in the more anterior left frontal lobe (march 2024), severe coronary calcification (1809, 94th percentile, CT pulmonary vein protocol June 2023), history of breast cancer, osteoporosis bilateral hip fractures, dementia (per daughter), thrombus of the left atrial appendage (CT scan ), pulmonary nodule, advanced age, post menopausal female.   Patient is accompanied by her daughter Bridget [name] at today's visit who provides collateral history as part of today's office visit.  Initially referred to the practice for evaluation of atrial fibrillation and abnormal CT of the chest concern for left atrial appendage thrombus.  Prior to establishing care patient was on Pradaxa for her permanent atrial fibrillation.  She had a chronic cough for which she underwent CT of the chest which noted concerns for left atrial appendage thrombus.  She was recommended to undergo transesophageal echocardiogram for confirmation of diagnosis; however, given her advanced age family chose not to proceed with invasive testing.  Pradaxa was transitioned to Eliquis and she had a repeat CT scan which noted improvement contrast/thrombus burden.  Since last office visit she was hospitalized in March 2024 when she presented with right sided facial droop/slurred speech and imaging study confirmed acute infarction in the left posterior frontal cortex.  She was not a tPA candidate  since she was already on Eliquis.  She was also treated for UTI secondary to Proteus organism during her recent hospitalization.  At the last office visit her weight was less than 60 kg and therefore Eliquis was reduced to 2.5 twice daily.  However in the interim she had gained weight and I suspect that one of the contributing factors to her stroke could be suboptimal anticoagulation dosing.  She also has hyperlipidemia with a indexed LDL of approximately 154 mg/dL.  She now presents for follow-up.  She completed inpatient rehab and did well.  However was discharged home on 2.5 mg p.o. twice daily of Eliquis.  Her overall functional capacity has reduced when compared to the past.  However denies anginal discomfort or heart failure symptoms.  She does not endorse evidence of bleeding   ALLERGIES: Allergies  Allergen Reactions   Pulmicort [Budesonide] Swelling    Eye swelling, ankle and knee swelling   Erythromycin Other (See Comments)    Unknown, but "all the mycins bother me"   Roxicodone [Oxycodone] Other (See Comments)    Confusion    Ultram [Tramadol] Other (See Comments)    Confusion     MEDICATION LIST PRIOR TO VISIT: Current Meds  Medication Sig   acetaminophen (TYLENOL) 325 MG tablet Take 2 tablets (650 mg total) by mouth every 6 (six) hours as needed for mild pain, fever or headache.   albuterol (VENTOLIN HFA) 108 (90 Base) MCG/ACT inhaler Inhale 2 puffs into the lungs every 6 (six) hours as needed for shortness of breath or wheezing.   apixaban (ELIQUIS) 5 MG TABS tablet Take 1 tablet (5 mg total) by mouth 2 (two) times daily.   atorvastatin (LIPITOR) 80  MG tablet Take 1 tablet (80 mg total) by mouth daily.   B Complex Vitamins (B COMPLEX PO) Take 1 capsule by mouth daily.   Budeson-Glycopyrrol-Formoterol (BREZTRI AEROSPHERE) 160-9-4.8 MCG/ACT AERO Inhale 2 puffs into the lungs in the morning and at bedtime.   cholecalciferol (VITAMIN D3) 10 MCG (400 UNIT) TABS tablet Take 1  tablet (400 Units total) by mouth daily.   denosumab (PROLIA) 60 MG/ML SOSY injection Inject 60 mg into the skin See admin instructions. Every 6 months   diltiazem (TIAZAC) 180 MG 24 hr capsule Take 2 capsules (360 mg total) by mouth daily.   melatonin 3 MG TABS tablet Take 1 tablet (3 mg total) by mouth at bedtime as needed.   memantine (NAMENDA) 10 MG tablet Take 1 tablet (10 mg total) by mouth 2 (two) times daily.   nitrofurantoin (MACRODANTIN) 50 MG capsule Take 1 capsule (50 mg total) by mouth at bedtime.   [DISCONTINUED] apixaban (ELIQUIS) 2.5 MG TABS tablet Take 1 tablet (2.5 mg total) by mouth 2 (two) times daily.     PAST MEDICAL HISTORY: Past Medical History:  Diagnosis Date   Atherosclerosis of aorta    Atrial fibrillation    Breast cancer    Chronic Bronchitis    Coronary atherosclerosis due to calcified coronary lesion    Nocturnal polyuria    Stroke    Thrombus of left atrial appendage     PAST SURGICAL HISTORY: Past Surgical History:  Procedure Laterality Date   BREAST LUMPECTOMY Left 2001   FEMUR FRACTURE SURGERY Right 2019   RIGHT - 2015   TOTAL ABDOMINAL HYSTERECTOMY  1967    FAMILY HISTORY: The patient family history includes Other in her sister.  SOCIAL HISTORY:  The patient  reports that she quit smoking about 74 years ago. Her smoking use included cigarettes. She has a 10.50 pack-year smoking history. She has never used smokeless tobacco. She reports that she does not drink alcohol and does not use drugs.  REVIEW OF SYSTEMS: Review of Systems  Cardiovascular:  Negative for chest pain, claudication, dyspnea on exertion, leg swelling, orthopnea, palpitations, paroxysmal nocturnal dyspnea and syncope.  Respiratory:  Negative for shortness of breath.   Musculoskeletal:  Positive for joint pain.  Psychiatric/Behavioral:         Cognitive impairment    PHYSICAL EXAM:    04/21/2022    2:15 PM 04/15/2022   12:55 PM 04/05/2022    7:09 AM  Vitals with BMI   Height 5\' 5"  5\' 5"    Weight  138 lbs 7 oz   BMI  23.04   Systolic 115 119 161  Diastolic 52 68 77  Pulse 71 74 73   Physical Exam  Constitutional: No distress. She appears chronically ill.  hemodynamically stable, presents in wheelchair, frail  Neck: No JVD present.  Cardiovascular: Normal rate, S1 normal, S2 normal, intact distal pulses and normal pulses. An irregularly irregular rhythm present. Exam reveals no gallop, no S3 and no S4.  No murmur heard. Pulmonary/Chest: Effort normal and breath sounds normal. No stridor. She has no wheezes. She has no rales.  Abdominal: Soft. Bowel sounds are normal. She exhibits no distension. There is no abdominal tenderness.  Musculoskeletal:        General: No edema.     Cervical back: Neck supple.  Neurological: She is alert.  Moves all 4 extremities against gravity. Strength right hand 2 out of 5, strength left hand 4 out of 5, strength bilateral lower extremities 4  out of 5   Skin: Skin is warm and moist.    RADIOLOGY: CT chest with contrast: 08/17/2020 IMPRESSION: 1. Scattered areas of bronchiectasis are noted in the lungs bilaterally with associated chronic scarring/atelectasis in the right middle lobe. Notably, there also 2 large pulmonary nodules in the posterior aspect of the left lower lobe. These are favored to be of infectious or inflammatory etiology, but underlying neoplasm is difficult to entirely exclude. Accordingly, short-term repeat chest CT is recommended 1-2 months to ensure the stability or regression of these findings. 2. Cardiomegaly with biatrial dilatation. Notably, there is a large filling defect in the tip of the left atrial appendage highly concerning for left atrial appendage thrombus. If present, this places the patient at high risk for systemic embolization. Further evaluation with transesophageal echocardiography is recommended if clinically appropriate. 3. Trace left pleural effusion lying dependently. 4. Aortic  atherosclerosis, in addition to left main and 3 vessel coronary artery disease. 5. There are calcifications of the aortic valve and mitral annulus. Echocardiographic correlation for evaluation of potential valvular dysfunction may be warranted if clinically indicated.  CT chest without contrast 12/21/2020: 1. Pulmonary nodules in the posterior left lower lobe have resolved since 08/17/2020 and compatible with resolved infection or inflammation. However, there are multiple new areas of airspace disease throughout both lungs, most prominent in the upper lobes. Findings are most compatible with infectious or inflammatory changes. In addition, there are scattered areas of bronchiectasis with areas of mucoid impaction. 2. Chronic collapse of the right middle lobe. 3. Cardiomegaly. 4.  Aortic Atherosclerosis (ICD10-I70.0).  MRI brain without contrast 03/16/2022 Acute infarcts in the left posterior frontal cortex, including the precentral gyrus.   Additional smaller subacute infarcts in the more anterior left frontal lobe.   CARDIAC DATABASE: EKG: April 21, 2022: Atrial fibrillation, 82 bpm, RBBB.  Echocardiogram: 09/13/2020: LVEF 50-55%, no regional wall motion abnormalities, diastolic dysfunction not evaluated due to underlying rhythm being atrial fibrillation, biatrial dilatation, moderate MR, moderate TR, no pulmonary hypertension, aortic plaque within the descending aorta.  03/16/2022:  1. Left ventricular ejection fraction, by estimation, is 55 to 60%. The  left ventricle has normal function. The left ventricle has no regional  wall motion abnormalities. Left ventricular diastolic parameters are  indeterminate.   2. Right ventricular systolic function is normal. The right ventricular  size is mildly enlarged. There is mildly elevated pulmonary artery  systolic pressure.   3. Left atrial size was severely dilated.   4. Right atrial size was severely dilated.   5. The mitral valve is  abnormal. Mild mitral valve regurgitation. No  evidence of mitral stenosis.   6. The tricuspid valve is abnormal. Tricuspid valve regurgitation is  moderate to severe.   7. The aortic valve was not well visualized. There is severe calcifcation  of the aortic valve. There is severe thickening of the aortic valve.  Aortic valve regurgitation is moderate. No aortic stenosis is present.   8. Agitated saline contrast bubble study was negative, with no evidence  of any interatrial shunt.     LABORATORY DATA: External Labs: Collected: 05/02/2020 Creatinine 0.8 mg/dL. eGFR: 47mL/min per 1.73 m AST 18, ALT 10, alkaline phosphatase 61 Hemoglobin 13.3 g/dL, hematocrit 16.1  Collected: 10/31/2019: Hemoglobin 14.6 g/dL, hematocrit 09.6%  LABORATORY DATA:    Latest Ref Rng & Units 03/31/2022    7:54 AM 03/28/2022    9:14 AM 03/24/2022    7:10 AM  CBC  WBC 4.0 - 10.5 K/uL 3.8  4.1  4.3   Hemoglobin 12.0 - 15.0 g/dL 16.1  09.6  04.5   Hematocrit 36.0 - 46.0 % 39.5  40.0  37.4   Platelets 150 - 400 K/uL 232  223  201        Latest Ref Rng & Units 03/31/2022    7:54 AM 03/24/2022    7:10 AM 03/21/2022    7:16 AM  CMP  Glucose 70 - 99 mg/dL 409  97  96   BUN 8 - 23 mg/dL 14  14  13    Creatinine 0.44 - 1.00 mg/dL 8.11  9.14  7.82   Sodium 135 - 145 mmol/L 135  136  138   Potassium 3.5 - 5.1 mmol/L 4.2  3.8  3.8   Chloride 98 - 111 mmol/L 104  106  108   CO2 22 - 32 mmol/L 21  24  21    Calcium 8.9 - 10.3 mg/dL 8.9  8.2  8.3   Total Protein 6.5 - 8.1 g/dL   5.7   Total Bilirubin 0.3 - 1.2 mg/dL   0.7   Alkaline Phos 38 - 126 U/L   50   AST 15 - 41 U/L   19   ALT 0 - 44 U/L   15     Lipid Panel     Component Value Date/Time   CHOL 252 (H) 03/16/2022 0144   TRIG 68 03/16/2022 0144   HDL 84 03/16/2022 0144   CHOLHDL 3.0 03/16/2022 0144   VLDL 14 03/16/2022 0144   LDLCALC 154 (H) 03/16/2022 0144    No components found for: "NTPROBNP" No results for input(s): "PROBNP" in the last  8760 hours. No results for input(s): "TSH" in the last 8760 hours.  BMP Recent Labs    03/21/22 0716 03/24/22 0710 03/31/22 0754  NA 138 136 135  K 3.8 3.8 4.2  CL 108 106 104  CO2 21* 24 21*  GLUCOSE 96 97 179*  BUN 13 14 14   CREATININE 0.63 0.63 0.78  CALCIUM 8.3* 8.2* 8.9  GFRNONAA >60 >60 >60    HEMOGLOBIN A1C Lab Results  Component Value Date   HGBA1C 5.6 03/16/2022   MPG 114 03/16/2022    Cardiac Panel (last 3 results) No results for input(s): "CKTOTAL", "CKMB", "TROPONINIHS", "RELINDX" in the last 72 hours.  CHOLESTEROL Recent Labs    03/16/22 0144  CHOL 252*    Hepatic Function Panel Recent Labs    09/16/21 0705 03/15/22 2220 03/21/22 0716  PROT 5.5* 6.1* 5.7*  ALBUMIN 2.8* 3.4* 3.0*  AST 19 21 19   ALT 22 13 15   ALKPHOS 44 55 50  BILITOT 1.0 0.8 0.7     IMPRESSION:    ICD-10-CM   1. Permanent atrial fibrillation  I48.21 EKG 12-Lead    apixaban (ELIQUIS) 5 MG TABS tablet    2. Long term (current) use of anticoagulants  Z79.01     3. Ischemic stroke of frontal lobe  I63.9     4. Pure hypercholesterolemia  E78.00     5. Thrombus of left atrial appendage  I51.3     6. Coronary atherosclerosis due to calcified coronary lesion  I25.10    I25.84     7. Atherosclerosis of aorta  I70.0     8. Former smoker  Z87.891       RECOMMENDATIONS: TINA GRUNER is a 89 y.o. female whose past medical history and cardiac risk factors include: Atrial fibrillation (permanent, per daughter), hx of stroke (  2017), s/p stroke Left posterior frontal cortex, including the precentral gyrus & smaller subacute infarcts in the more anterior left frontal lobe (march 2024), severe coronary calcification (1809, 94th percentile, CT pulmonary vein protocol June 2023), history of breast cancer, osteoporosis bilateral hip fractures, dementia (per daughter), thrombus of the left atrial appendage (CT scan ), pulmonary nodule, advanced age, post menopausal female.    Permanent atrial fibrillation Rate control: Diltiazem. Rhythm control: N/A. Thromboembolic prophylaxis: Eliquis EKG illustrates atrial fibrillation with controlled ventricular rate. Does not endorse evidence of falls or bleeding. Most recent labs from March 2024 independently reviewed and noted above for further reference. Recently had a hospitalization in March 2024 due to acute stroke likely secondary to inadequate anticoagulation. Given her current weight recommend Eliquis 5 mg p.o. twice daily.  If her weight is less than 132 pounds I have asked her to reduce the dose of Eliquis to 2.5 mg p.o. twice daily.  Long term (current) use of anticoagulants Indication: Permanent atrial fibrillation and presumed left atrial appendage thrombus In the past transition from Pradaxa to Eliquis Click Here to Calculate/Change CHADS2VASc Score The patient's CHADS2-VASc score is 7, indicating a 11.2% annual risk of stroke.  Therefore, anticoagulation is recommended.   CHF History: No HTN History: Yes Diabetes History: No Stroke History: Yes Vascular Disease History: Yes  Ischemic stroke of frontal lobe Occurred in March 2024. Educated her on the importance of improving her modifiable cardiovascular risk factors for secondary prevention  Pure hypercholesterolemia Initially shared decision was to forego statin therapy for hyperlipidemia management given her advanced age/dementia. However, during her hospitalization in March 2024 when she presented with acute stroke her indexed LDL level calculated 154 mg/dL.  She was placed on high intensity statin and has done well.  Will continue therapy.  Thrombus of left atrial appendage Presumed based on CT scan. She has refused transesophageal echocardiogram in the past to confirm the diagnosis given her advanced age. Monitor for now.  Coronary atherosclerosis due to calcified coronary lesion Atherosclerosis of aorta Denies anginal chest pain or heart  failure symptoms. Currently on anticoagulation for A-fib. Continue statin therapy. Given her advanced age family has chosen to continue with conservative management.  Which is very reasonable.  Monitor clinically  FINAL MEDICATION LIST END OF ENCOUNTER: Meds ordered this encounter  Medications   apixaban (ELIQUIS) 5 MG TABS tablet    Sig: Take 1 tablet (5 mg total) by mouth 2 (two) times daily.    Dispense:  180 tablet    Refill:  0     Medications Discontinued During This Encounter  Medication Reason   apixaban (ELIQUIS) 2.5 MG TABS tablet Dose change      Current Outpatient Medications:    acetaminophen (TYLENOL) 325 MG tablet, Take 2 tablets (650 mg total) by mouth every 6 (six) hours as needed for mild pain, fever or headache., Disp: , Rfl:    albuterol (VENTOLIN HFA) 108 (90 Base) MCG/ACT inhaler, Inhale 2 puffs into the lungs every 6 (six) hours as needed for shortness of breath or wheezing., Disp: 8 g, Rfl: 0   apixaban (ELIQUIS) 5 MG TABS tablet, Take 1 tablet (5 mg total) by mouth 2 (two) times daily., Disp: 180 tablet, Rfl: 0   atorvastatin (LIPITOR) 80 MG tablet, Take 1 tablet (80 mg total) by mouth daily., Disp: 30 tablet, Rfl: 0   B Complex Vitamins (B COMPLEX PO), Take 1 capsule by mouth daily., Disp: , Rfl:    Budeson-Glycopyrrol-Formoterol (BREZTRI AEROSPHERE) 160-9-4.8 MCG/ACT  AERO, Inhale 2 puffs into the lungs in the morning and at bedtime., Disp: 10.7 g, Rfl: 5   cholecalciferol (VITAMIN D3) 10 MCG (400 UNIT) TABS tablet, Take 1 tablet (400 Units total) by mouth daily., Disp: 30 tablet, Rfl: 0   denosumab (PROLIA) 60 MG/ML SOSY injection, Inject 60 mg into the skin See admin instructions. Every 6 months, Disp: , Rfl:    diltiazem (TIAZAC) 180 MG 24 hr capsule, Take 2 capsules (360 mg total) by mouth daily., Disp: 60 capsule, Rfl: 0   melatonin 3 MG TABS tablet, Take 1 tablet (3 mg total) by mouth at bedtime as needed., Disp: 30 tablet, Rfl: 0   memantine (NAMENDA)  10 MG tablet, Take 1 tablet (10 mg total) by mouth 2 (two) times daily., Disp: 30 tablet, Rfl: 0   nitrofurantoin (MACRODANTIN) 50 MG capsule, Take 1 capsule (50 mg total) by mouth at bedtime., Disp: 30 capsule, Rfl: 0  Orders Placed This Encounter  Procedures   EKG 12-Lead    There are no Patient Instructions on file for this visit.   --Continue cardiac medications as reconciled in final medication list. --Return in about 3 months (around 07/21/2022) for Follow up, A. fib. Or sooner if needed. --Continue follow-up with your primary care physician regarding the management of your other chronic comorbid conditions.  Patient's questions and concerns were addressed to her satisfaction. She voices understanding of the instructions provided during this encounter.   This note was created using a voice recognition software as a result there may be grammatical errors inadvertently enclosed that do not reflect the nature of this encounter. Every attempt is made to correct such errors.  Tessa Lerner, [place], [facility]  Pager:  [phone] Office: [phone]

## 2022-04-26 ENCOUNTER — Encounter: Payer: Self-pay | Admitting: Cardiology

## 2022-04-29 ENCOUNTER — Other Ambulatory Visit: Payer: Self-pay

## 2022-04-29 ENCOUNTER — Telehealth: Payer: Self-pay | Admitting: Diagnostic Neuroimaging

## 2022-04-29 ENCOUNTER — Other Ambulatory Visit: Payer: Self-pay | Admitting: Cardiology

## 2022-04-29 ENCOUNTER — Telehealth: Payer: Self-pay

## 2022-04-29 ENCOUNTER — Encounter: Payer: Self-pay | Admitting: Diagnostic Neuroimaging

## 2022-04-29 DIAGNOSIS — I4821 Permanent atrial fibrillation: Secondary | ICD-10-CM

## 2022-04-29 MED ORDER — DILTIAZEM HCL ER BEADS 180 MG PO CP24: 360.0000 mg | ORAL_CAPSULE | Freq: Every day | ORAL | 0 refills | Status: DC

## 2022-04-29 MED ORDER — APIXABAN 2.5 MG PO TABS: 2.5000 mg | ORAL_TABLET | Freq: Two times a day (BID) | ORAL | 3 refills | Status: DC

## 2022-04-29 NOTE — Telephone Encounter (Signed)
Spoke with patients daughter, she called to let us know that the patients weight is down to 120 lb. He changed her Eliuis dosage to 2.5 mg. I let her know not to cut the 5 mg in half as well.

## 2022-04-29 NOTE — Telephone Encounter (Signed)
Sent letter in mail informing pt of appointment change due to provider template change

## 2022-05-07 ENCOUNTER — Encounter: Payer: Self-pay | Admitting: Podiatry

## 2022-05-07 ENCOUNTER — Ambulatory Visit (INDEPENDENT_AMBULATORY_CARE_PROVIDER_SITE_OTHER): Payer: Medicare Other | Admitting: Podiatry

## 2022-05-07 VITALS — BP 120/62

## 2022-05-07 DIAGNOSIS — B351 Tinea unguium: Secondary | ICD-10-CM

## 2022-05-07 DIAGNOSIS — M79675 Pain in left toe(s): Secondary | ICD-10-CM | POA: Diagnosis not present

## 2022-05-07 DIAGNOSIS — M79674 Pain in right toe(s): Secondary | ICD-10-CM | POA: Diagnosis not present

## 2022-05-07 DIAGNOSIS — G629 Polyneuropathy, unspecified: Secondary | ICD-10-CM | POA: Diagnosis not present

## 2022-05-07 NOTE — Progress Notes (Signed)
  Subjective:  Patient ID: Bridget [name], female    DOB: December 14, 1921,  MRN: [id number]  Bridget [name] presents to clinic today for at risk foot care with history of diabetic neuropathy  Chief Complaint  Patient presents with   Nail Problem    R[facility] PCP-Tisovec PCP VST-3 weeks ago   New problem(s): None.   PCP is Tisovec, Adelfa Koh, MD.  Allergies  Allergen Reactions   Pulmicort [Budesonide] Swelling    Eye swelling, ankle and knee swelling   Erythromycin Other (See Comments)    Unknown, but "all the mycins bother me"   Roxicodone [Oxycodone] Other (See Comments)    Confusion    Ultram [Tramadol] Other (See Comments)    Confusion     Review of Systems: Negative except as noted in the HPI.  Objective: No changes noted in today's physical examination. There were no vitals filed for this visit. Bridget [name] is a pleasant 89 y.o. female thin build in NAD. AAO x 3.  Vascular CFT <3 seconds b/l LE. Palpable DP pulse(s) b/l LE. Palpable PT pulse(s) b/l LE. Pedal hair absent. No pain with calf compression b/l. Lower extremity skin temperature gradient within normal limits. No edema noted b/l LE. No cyanosis or clubbing noted b/l LE.   Neurologic Normal speech. Pt has subjective symptoms of neuropathy. Protective sensation intact 5/5 intact bilaterally with 10g monofilament b/l. Vibratory sensation intact b/l.  Dermatologic Pedal skin thin and atrophic b/l LE. No open wounds b/l LE. No interdigital macerations noted b/l LE.   Toenails 1-5 b/l elongated, discolored, dystrophic, thickened, crumbly with subungual debris and tenderness to dorsal palpation.   Incurvated nailplate right 2nd toe medial border(s) with tenderness to palpation. No erythema, no edema, no drainage noted.    No hyperkeratotic nor porokeratotic lesions present on today's visit.  Orthopedic: Muscle strength 5/5 to all lower extremity muscle groups bilaterally. Hammertoe deformity noted 2-5 b/l.   Assessment/Plan: 1.  Pain due to onychomycosis of toenails of both feet   2. Polyneuropathy    -Patient was evaluated and treated. All patient's and/or POA's questions/concerns answered on today's visit. -Toenails were debrided in length and girth bilateral great toes, 3-5 bilaterally, and L 2nd toe with sterile nail nippers and dremel without iatrogenic bleeding.  -No invasive procedure(s) performed. Offending nail border debrided and curretaged R 2nd toe utilizing sterile nail nipper and currette. Border(s) cleansed with alcohol and triple antibiotic ointment applied. Patient/POA/Caregiver/Facility instructed to apply Neosporin Cream  to R 2nd toe once daily for 7 days. Call office if there are any concerns. -Patient/POA to call should there be question/concern in the interim.   Return in about 3 months (around 08/07/2022).  Freddie Breech, DPM

## 2022-05-08 ENCOUNTER — Encounter (HOSPITAL_BASED_OUTPATIENT_CLINIC_OR_DEPARTMENT_OTHER): Payer: Self-pay | Admitting: Pulmonary Disease

## 2022-05-08 ENCOUNTER — Ambulatory Visit (INDEPENDENT_AMBULATORY_CARE_PROVIDER_SITE_OTHER): Payer: Medicare Other | Admitting: Pulmonary Disease

## 2022-05-08 VITALS — BP 110/60 | HR 71 | Temp 97.6°F | Ht 65.0 in | Wt 120.0 lb

## 2022-05-08 DIAGNOSIS — J479 Bronchiectasis, uncomplicated: Secondary | ICD-10-CM | POA: Diagnosis not present

## 2022-05-08 MED ORDER — BREZTRI AEROSPHERE 160-9-4.8 MCG/ACT IN AERO: 2.0000 | INHALATION_SPRAY | Freq: Two times a day (BID) | RESPIRATORY_TRACT | 5 refills | Status: DC

## 2022-05-08 NOTE — Progress Notes (Signed)
Subjective:   PATIENT ID: Bridget Lauth GENDER: female DOB: [birth year], MRN: [id number]   HPI  Chief Complaint  Patient presents with   Follow-up    Follow up. Patient has no complaints.     Reason for Visit: Follow-up bronchiectasis  Bridget [name] is a 89 year old female former smoker with dementia, atrial fibrillation, left thrombus of atrial appendage, hx breast cancer s/p lumpectomy, osteoporosis and bronchiectasis.  Synopsis: She has previously seen a Pulmonologist 38 years ago for hemoptysis that resolved after initial presentation. Attributed to bronchiectasis and she was treated with a slant board. She has a chronic cough associated with white sputum that equates to at least 2-3 tablespoons. Denies hemoptysis. Denies wheezing or shortness of breath. She has a history of whooping cough as a child. 2022 - Covid in October. Treated for exacerbation in Dec. 2023 - On Advair. Outpatient exacerbation in Feb. Trialed nebulizers but returned to Advair with spacer. Hospitalized in Sept for CAP. Started on Truman in Nov. Ordered smart vest for significant mucous production that has worsened since her hospitalization. Difficulty handling inhalers alone and needing assistance to use spacer due to inconsistent timing.  01/27/22 Daughter and granddaughter provides history. Since our last visit patient/family called in regarding appropriate settings for chest vest. Currently on 6-7-8 and limiting to daily use. She was also treated for a bronchiectasis exacerbation with prednisone and azithro in early January however returned to clinic for persistent symptoms but noted to be overall improving so no antibiotics/steroids given. Patient at baseline with shortness of breath, wheezing, produces significant mucous twice a day. Still having chest congestion.  05/08/22 Since our last visit she had a stroke in March with residual right sided weakness. Currently receiving PT/OT at home. No speech  indicated. Compliant with her Breztri. Has not been able to comply with smart vest due to fighting due dementia. Vest has been returned. Patient denies shortness of breath. Daughter provides majority of history.   Social History: Former smoker. Smoked 1/2 ppd x 15 years. Quit in 1950   Past Medical History:  Diagnosis Date   Atherosclerosis of aorta (HCC)    Atrial fibrillation (HCC)    Breast cancer (HCC)    Chronic Bronchitis    Coronary atherosclerosis due to calcified coronary lesion    Nocturnal polyuria    Stroke (HCC)    Thrombus of left atrial appendage      Family History  Problem Relation Age of Onset   Other Sister        TRAUMA TO HEAD AFTER A FALL     Social History   Occupational History   Not on file  Tobacco Use   Smoking status: Former    Packs/day: 1.50    Years: 7.00    Additional pack years: 0.00    Total pack years: 10.50    Types: Cigarettes    Quit date: 1950    Years since quitting: 74.3   Smokeless tobacco: Never  Vaping Use   Vaping Use: Never used  Substance and Sexual Activity   Alcohol use: Never   Drug use: Never   Sexual activity: Not Currently    Allergies  Allergen Reactions   Pulmicort [Budesonide] Swelling    Eye swelling, ankle and knee swelling   Erythromycin Other (See Comments)    Unknown, but "all the mycins bother me"   Roxicodone [Oxycodone] Other (See Comments)    Confusion    Ultram [Tramadol] Other (See Comments)  Confusion      Outpatient Medications Prior to Visit  Medication Sig Dispense Refill   acetaminophen (TYLENOL) 325 MG tablet Take 2 tablets (650 mg total) by mouth every 6 (six) hours as needed for mild pain, fever or headache.     albuterol (VENTOLIN HFA) 108 (90 Base) MCG/ACT inhaler Inhale 2 puffs into the lungs every 6 (six) hours as needed for shortness of breath or wheezing. 8 [name] 0   apixaban (ELIQUIS) 2.5 MG TABS tablet Take 1 tablet (2.5 mg total) by mouth 2 (two) times daily. 60 tablet 3    atorvastatin (LIPITOR) 80 MG tablet Take 1 tablet (80 mg total) by mouth daily. 30 tablet 0   B Complex Vitamins (B COMPLEX PO) Take 1 capsule by mouth daily.     cholecalciferol (VITAMIN D3) 10 MCG (400 UNIT) TABS tablet Take 1 tablet (400 Units total) by mouth daily. 30 tablet 0   denosumab (PROLIA) 60 MG/ML SOSY injection Inject 60 mg into the skin See admin instructions. Every 6 months     diltiazem (TIAZAC) 180 MG 24 hr capsule TAKE 2 CAPSULES(360 MG) BY MOUTH DAILY 180 capsule 3   melatonin 3 MG TABS tablet Take 1 tablet (3 mg total) by mouth at bedtime as needed. 30 tablet 0   memantine (NAMENDA) 10 MG tablet Take 1 tablet (10 mg total) by mouth 2 (two) times daily. 30 tablet 0   nitrofurantoin (MACRODANTIN) 50 MG capsule Take 1 capsule (50 mg total) by mouth at bedtime. 30 capsule 0   Budeson-Glycopyrrol-Formoterol (BREZTRI AEROSPHERE) 160-9-4.8 MCG/ACT AERO Inhale 2 puffs into the lungs in the morning and at bedtime. 10.7 [name] 5   No facility-administered medications prior to visit.    Review of Systems  Constitutional:  Negative for chills, diaphoresis, fever, malaise/fatigue and weight loss.  HENT:  Positive for congestion.   Respiratory:  Positive for cough, sputum production, shortness of breath and wheezing. Negative for hemoptysis.   Cardiovascular:  Negative for chest pain, palpitations and leg swelling.     Objective:   Vitals:   05/08/22 1431  BP: 110/60  Pulse: 71  Temp: 97.6 F (36.4 C)  TempSrc: Oral  SpO2: 97%  Weight: 120 lb (54.4 kg)  Height: 5\' 5"  (1.651 m)   SpO2: 97 % O2 Device: None (Room air)  Physical Exam: General: Elderly, frail-appearing, no acute distress HENT: [place], AT Eyes: EOMI, no scleral icterus Respiratory: Diminished breath sounds to auscultation bilaterally.  No crackles, wheezing or rales Cardiovascular: RRR, -M/R/[name], no JVD Extremities:-Edema,-tenderness Neuro: AAO x4, CNII-XII grossly intact Psych: Normal mood, normal affect  Data  Reviewed:  Imaging: CT Chest 08/17/20 - Scattered cylindrical bronchiectasis with interstitium and peribronchovascular ground glass attenuation and micro-nodularity. Complete atelectasis in the RML. LLL pleural nodules measuring 9 x 11mm and 11 x 8 mm nodules CT Chest 12/19/20 - Resolution of pleural nodules in left lower lobe. Interval development of nodular opacities in upper lobes bilaterally. Unchanged atelectasis of RML. CT Chest 02/18/21 - Chronic RML atelectasis. Resolution of upper lobes bilaterally. Of note, improved left atrial thrombus noted. CXR 09/25/21 - Persistent LUL opacity CXR 11/20/21 - Resolved LUL infiltrate, bibasilar atelectasis. Unchanged RML collapse CXR 01/16/22 - Resolved infiltrate. Chronic interstitial changes. Unchanged RML collapse CXR 03/15/22 - Unchanged RML collapse, chronic insterstitial changes  PFT: None on file  Echo: 09/2020 - EF 50-55%. TV systolic function is reduced. Severely biatrial dilation. Moderate MV, TR  Labs: CBC    Component Value Date/Time  WBC 3.8 (L) 03/31/2022 0754   RBC 4.07 03/31/2022 0754   HGB 13.3 03/31/2022 0754   HGB 14.2 12/03/2021 1304   HCT 39.5 03/31/2022 0754   HCT 42.2 12/03/2021 1304   PLT 232 03/31/2022 0754   MCV 97.1 03/31/2022 0754   MCH 32.7 03/31/2022 0754   MCHC 33.7 03/31/2022 0754   RDW 14.0 03/31/2022 0754   LYMPHSABS 1.2 03/21/2022 0716   MONOABS 0.4 03/21/2022 0716   EOSABS 0.1 03/21/2022 0716   BASOSABS 0.0 03/21/2022 0716    Assessment & Plan:   Discussion: 89 year old female former smoker with dementia, atrial fibrillation, left thrombus of atrial appendage, hx breast cancer s/p lumpectomy, osteoporosis who presents for follow-up. Discussed clinical course and management of bronchiectasis including bronchodilator regimen and action plan for exacerbation.  Bronchiectasis with chronic bronchitis   Chronic RML atelectasis --CONTINUE Breztri TWO puffs TWICE a day --CONTINUE Albuterol as needed for  shortness of breath or wheezing --Encourage mucinex twice a day as needed. Directions per package  Health Maintenance Immunization History  Administered Date(s) Administered   Fluad Quad(high Dose 65+) 10/04/2021   Influenza, High Dose Seasonal PF 09/06/2017   Influenza, Quadrivalent, Recombinant, Inj, Pf 09/30/2018, 10/31/2019, 09/12/2020   Moderna Sars-Covid-2 Vaccination 01/07/2019, 02/04/2019, 11/18/2019   Pneumococcal Polysaccharide-23 12/17/2021   CT Lung Screen - Not qualified. >15 years after tobacco cessation  No orders of the defined types were placed in this encounter.  Meds ordered this encounter  Medications   Budeson-Glycopyrrol-Formoterol (BREZTRI AEROSPHERE) 160-9-4.8 MCG/ACT AERO    Sig: Inhale 2 puffs into the lungs in the morning and at bedtime.    Dispense:  10.7 [name]    Refill:  5   Return in about 6 months (around 11/08/2022).   I have spent a total time of 38-minutes on the day of the appointment including chart review, data review, collecting history, coordinating care and discussing medical diagnosis and plan with the patient/family. Past medical history, allergies, medications were reviewed. Pertinent imaging, labs and tests included in this note have been reviewed and interpreted independently by me.  Jakira Mcfadden Mechele Collin, MD [facility] 05/08/2022 3:18 PM  Office Number [phone]

## 2022-05-08 NOTE — Patient Instructions (Signed)
Bronchiectasis with chronic bronchitis   Chronic RML atelectasis --CONTINUE Breztri TWO puffs TWICE a day. REFILLED --CONTINUE Albuterol as needed for shortness of breath or wheezing --Encourage mucinex twice a day as needed. Directions per package

## 2022-05-09 ENCOUNTER — Encounter (INDEPENDENT_AMBULATORY_CARE_PROVIDER_SITE_OTHER): Payer: Medicare Other | Admitting: Ophthalmology

## 2022-05-09 DIAGNOSIS — I10 Essential (primary) hypertension: Secondary | ICD-10-CM | POA: Diagnosis not present

## 2022-05-09 DIAGNOSIS — H353231 Exudative age-related macular degeneration, bilateral, with active choroidal neovascularization: Secondary | ICD-10-CM | POA: Diagnosis not present

## 2022-05-09 DIAGNOSIS — H35033 Hypertensive retinopathy, bilateral: Secondary | ICD-10-CM

## 2022-05-09 DIAGNOSIS — H43813 Vitreous degeneration, bilateral: Secondary | ICD-10-CM

## 2022-05-12 ENCOUNTER — Encounter (HOSPITAL_BASED_OUTPATIENT_CLINIC_OR_DEPARTMENT_OTHER): Payer: Self-pay | Admitting: Pulmonary Disease

## 2022-05-27 ENCOUNTER — Encounter: Payer: Medicare Other | Admitting: Physical Medicine & Rehabilitation

## 2022-06-03 ENCOUNTER — Encounter: Payer: Medicare Other | Attending: Registered Nurse | Admitting: Physical Medicine & Rehabilitation

## 2022-06-03 ENCOUNTER — Encounter: Payer: Self-pay | Admitting: Physical Medicine & Rehabilitation

## 2022-06-03 VITALS — BP 131/88 | HR 93 | Ht 65.0 in

## 2022-06-03 DIAGNOSIS — I4891 Unspecified atrial fibrillation: Secondary | ICD-10-CM | POA: Insufficient documentation

## 2022-06-03 DIAGNOSIS — I639 Cerebral infarction, unspecified: Secondary | ICD-10-CM | POA: Insufficient documentation

## 2022-06-03 NOTE — Progress Notes (Signed)
Subjective:    Patient ID: Bridget [name], female    DOB: 09-11-1921, 89 y.o.   MRN: [id number] admitted to rehab 03/20/2022 for inpatient therapies to consist of PT, ST and OT at least three hours five days a week. Past admission physiatrist, therapy team and rehab RN have worked together to provide customized collaborative inpatient rehab.  Pertaining to patient's left precentral gyrus and left posterior frontal lobe infarction likely embolic in the setting of atrial fibrillation.  Patient remain on Eliquis as prior to admission and would follow-up neurology services.  Namenda ongoing for history of dementia with mood stabilization she was using some melatonin as needed for sleep.  History of atrial fibrillation with RVR Cardizem as directed with rate controlled follow-up outpatient.  She completed course of Rocephin for UTI/Proteus no dysuria but she did have some frequency and remained on prophylactic Macrodantin as prior to admission.  Lipitor for hyperlipidemia.  Diet controlled diabetes mellitus hemoglobin A1c 5.6  89 y.o. right-handed female with history of moderate dementia maintained on Namenda requiring 24/7 assistance, diet-controlled diabetes mellitus atrial fibrillation maintained on Eliquis as well as history of CVA with no residual weakness, history of breast cancer with lumpectomy.  Per chart review lives with granddaughter and 24-hour assistance.  Used a rollator at baseline for mobility.  Presented 03/15/2022 with right-sided facial droop and slurred speech of acute onset.  CT/MRI showed acute infarction in the left posterior frontal cortex including the precentral gyrus.  Additional smaller subacute infarcts in the more anterior left frontal lobe.  Patient did not receive tPA and maintained on Eliquis.  CT angiogram head and neck with no intracranial large vessel occlusion.  No significant stenosis in the neck.  CT of the abdomen and pelvis due to abdominal pain showed no acute abdominal  pathology.  Admission chemistries unremarkable except sodium 130 urine culture 100,000 Proteus.  Echocardiogram ejection fraction of 55 to 60% no wall motion abnormalities.  Neurology follow-up remained on Eliquis as prior to admission.  Rocephin added for UTI.  Therapy evaluations completed due to patient decreased functional mobility and dysarthria was admitted for a comprehensive rehab program.  Admit date: 03/20/2022 Discharge date: 04/05/2022 HPI  Living with daughter ( pt not aware of this)  ,  Pt confabulates says she gets in shower however daughter states that she gets a sponge bath.  The patient uses a pure wick at night for continence.  She requires assistance with standing using a walker and a gait belt.  She does not drive.  She walks with her walker with assistance from physical therapy but not from the daughter who does not feel like she has the strength to provide the needed support.  She has a ramp entry at her home. Pain Inventory Average Pain 0 Pain Right Now 0 My pain is  pain in knees and legs when standing and walking  LOCATION OF PAIN  knees, legs  BOWEL Number of stools per week: 4 Oral laxative use Yes    BLADDER Normal and Pads  Bladder incontinence Yes     Mobility walk with assistance use a walker ability to climb steps?  no do you drive?  no use a wheelchair Do you have any goals in this area?  yes  Function retired I need assistance with the following:  bathing and toileting Do you have any goals in this area?  yes  Neuro/Psych bladder control problems  Prior Studies Any changes since last visit?  no  Physicians  involved in your care Any changes since last visit?  no   Family History  Problem Relation Age of Onset   Other Sister        TRAUMA TO HEAD AFTER A FALL   Social History   Socioeconomic History   Marital status: Widowed    Spouse name: Not on file   Number of children: 3   Years of education: Not on file   Highest  education level: Not on file  Occupational History   Not on file  Tobacco Use   Smoking status: Former    Packs/day: 1.50    Years: 7.00    Additional pack years: 0.00    Total pack years: 10.50    Types: Cigarettes    Quit date: 54    Years since quitting: 74.4   Smokeless tobacco: Never  Vaping Use   Vaping Use: Never used  Substance and Sexual Activity   Alcohol use: Never   Drug use: Never   Sexual activity: Not Currently  Other Topics Concern   Not on file  Social History Narrative   Daughter Bridget [name] accompanies patient to her appointments.   Social Determinants of Health   Financial Resource Strain: Not on file  Food Insecurity: Not on file  Transportation Needs: Not on file  Physical Activity: Not on file  Stress: Not on file  Social Connections: Not on file   Past Surgical History:  Procedure Laterality Date   BREAST LUMPECTOMY Left 2001   FEMUR FRACTURE SURGERY Right 2019   RIGHT - 2015   TOTAL ABDOMINAL HYSTERECTOMY  1967   Past Medical History:  Diagnosis Date   Atherosclerosis of aorta (HCC)    Atrial fibrillation (HCC)    Breast cancer (HCC)    Chronic Bronchitis    Coronary atherosclerosis due to calcified coronary lesion    Nocturnal polyuria    Stroke (HCC)    Thrombus of left atrial appendage    BP 131/88   Pulse 93   Ht 5\' 5"  (1.651 m)   SpO2 95%   BMI 19.97 kg/m   Opioid Risk Score:   Fall Risk Score:  `1  Depression screen [facility] 2/9     06/03/2022    2:17 PM 04/15/2022   12:57 PM  Depression screen PHQ 2/9  Decreased Interest 0 0  Down, Depressed, Hopeless 0 0  PHQ - 2 Score 0 0  Altered sleeping  0  Tired, decreased energy  0  Change in appetite  0  Feeling bad or failure about yourself   0  Trouble concentrating  0  Moving slowly or fidgety/restless  0  Suicidal thoughts  0  PHQ-9 Score  0     Review of Systems  Musculoskeletal:  Positive for gait problem.  All other systems reviewed and are negative.      Objective:   Physical Exam  Oriented to doctors office but not time Can answer simple questions but tends to confabulate. She is able to follow simple commands. Manual muscle testing 4/5 right deltoid by stress of grip hip flexor knee extensor ankle dorsiflexor 5/5 to the left deltoid bicep tricep grip hip flexor knee extensor ankle dorsiflexor Mild ataxia right finger-nose-finger Sensation to light touch is reported is equal bilateral upper and lower limbs Speech without evidence of dysarthria or aphasia.      Assessment & Plan:   1.  Left preCentral gyrus infarct with right hemiparesis.  She has had worsening of her mobility issues following  stroke according to daughter.  Cognitively she also declined although she did have a moderate dementia prior to the stroke. Continue home health therapy, since transportation is an issue and requires wheelchair Zenaida Niece will likely not refer for outpatient therapy. Physical medicine rehab follow-up on as-needed basis Follow-up with PCP Follow-up with neurology Follow-up cardiology

## 2022-06-12 ENCOUNTER — Inpatient Hospital Stay: Payer: Medicare Other | Admitting: Diagnostic Neuroimaging

## 2022-06-13 ENCOUNTER — Ambulatory Visit: Payer: Medicare Other | Admitting: Cardiology

## 2022-06-24 ENCOUNTER — Ambulatory Visit (INDEPENDENT_AMBULATORY_CARE_PROVIDER_SITE_OTHER): Payer: Medicare Other | Admitting: Diagnostic Neuroimaging

## 2022-06-24 ENCOUNTER — Encounter: Payer: Self-pay | Admitting: Diagnostic Neuroimaging

## 2022-06-24 VITALS — BP 135/74 | HR 77 | Ht 65.0 in | Wt 125.0 lb

## 2022-06-24 DIAGNOSIS — I63412 Cerebral infarction due to embolism of left middle cerebral artery: Secondary | ICD-10-CM | POA: Diagnosis not present

## 2022-06-24 NOTE — Progress Notes (Signed)
[facility]  PATIENT: Bridget [name] DOB: Jun 25, 1921  REFERRING CLINICIAN: Ghimire, Werner Lean, MD HISTORY FROM: patient REASON FOR VISIT: new consult   HISTORICAL  CHIEF COMPLAINT:  Chief Complaint  Patient presents with   New Patient (Initial Visit)    Patient in room #7 with her daughter. Patient states she here to f/u with her hospital visit from a stroke.    HISTORY OF PRESENT ILLNESS:   UPDATE (06/24/22, VRP): 89 year old female where for hospital stroke follow up. Since hospital discharge, doing better. Still with right sided weakness. Was living in [place] ~5 years ago, but due to dementia, moved to [place] to live with daughter. They have aids helping at home. Tolerating medications.   PRIOR HPI (03/15/22, Bridget [name]): " 89 y.o. female past medical history of atrial fibrillation, left atrial appendage thrombus, on Eliquis, history of breast cancer, prior history of stroke with no residual deficits, dementia, general debility with using walker to walk, sequel of dementia requiring 24/7 help with ADLs, presented to the emergency room via EMS for concern for strokelike symptoms Last known well was 2130, was being helped to go to bed when she had a sudden onset of garbled speech and the liquid in her mouth started to drool from 1 side.  Daughter is unclear which side the droop was.  EMTs reported right-sided droop initially.  Patient was having a hard time formulating her sentences and her speech was extremely garbled.  Her speech started to become somewhat more comprehensible on the way.  When I examined her, she was severely dysarthric with no evidence of aphasia at this time.   Currently on Eliquis. Was on pradaxa at one point but was changed to Eliquis.   LKW: 2130 IV thrombolysis given?: no, on Eliquis EVT: No LVO, poor MR S Premorbid modified Rankin scale (mRS): 4"  REVIEW OF SYSTEMS: Full 14 system review of systems performed and negative with exception of:  as per HPI.  ALLERGIES: Allergies  Allergen Reactions   Pulmicort [Budesonide] Swelling    Eye swelling, ankle and knee swelling   Erythromycin Other (See Comments)    Unknown, but "all the mycins bother me"   Roxicodone [Oxycodone] Other (See Comments)    Confusion    Ultram [Tramadol] Other (See Comments)    Confusion     HOME MEDICATIONS: Outpatient Medications Prior to Visit  Medication Sig Dispense Refill   acetaminophen (TYLENOL) 325 MG tablet Take 2 tablets (650 mg total) by mouth every 6 (six) hours as needed for mild pain, fever or headache.     albuterol (VENTOLIN HFA) 108 (90 Base) MCG/ACT inhaler Inhale 2 puffs into the lungs every 6 (six) hours as needed for shortness of breath or wheezing. 8 g 0   apixaban (ELIQUIS) 2.5 MG TABS tablet Take 1 tablet (2.5 mg total) by mouth 2 (two) times daily. 60 tablet 3   atorvastatin (LIPITOR) 80 MG tablet Take 1 tablet (80 mg total) by mouth daily. 30 tablet 0   B Complex Vitamins (B COMPLEX PO) Take 1 capsule by mouth daily.     Budeson-Glycopyrrol-Formoterol (BREZTRI AEROSPHERE) 160-9-4.8 MCG/ACT AERO Inhale 2 puffs into the lungs in the morning and at bedtime. 10.7 g 5   cephALEXin (KEFLEX) 250 MG capsule Take 250 mg by mouth daily.     cholecalciferol (VITAMIN D3) 10 MCG (400 UNIT) TABS tablet Take 1 tablet (400 Units total) by mouth daily. 30 tablet 0   denosumab (PROLIA) 60  MG/ML SOSY injection Inject 60 mg into the skin See admin instructions. Every 6 months     diltiazem (TIAZAC) 180 MG 24 hr capsule TAKE 2 CAPSULES(360 MG) BY MOUTH DAILY 180 capsule 3   melatonin 3 MG TABS tablet Take 1 tablet (3 mg total) by mouth at bedtime as needed. 30 tablet 0   memantine (NAMENDA) 10 MG tablet Take 1 tablet (10 mg total) by mouth 2 (two) times daily. 30 tablet 0   nitrofurantoin (MACRODANTIN) 50 MG capsule Take 1 capsule (50 mg total) by mouth at bedtime. 30 capsule 0   No facility-administered medications prior to visit.    PAST  MEDICAL HISTORY: Past Medical History:  Diagnosis Date   Atherosclerosis of aorta (HCC)    Atrial fibrillation (HCC)    Breast cancer (HCC)    Chronic Bronchitis    Coronary atherosclerosis due to calcified coronary lesion    Nocturnal polyuria    Stroke (HCC)    Thrombus of left atrial appendage     PAST SURGICAL HISTORY: Past Surgical History:  Procedure Laterality Date   BREAST LUMPECTOMY Left 2001   FEMUR FRACTURE SURGERY Right 2019   RIGHT - 2015   TOTAL ABDOMINAL HYSTERECTOMY  1967    FAMILY HISTORY: Family History  Problem Relation Age of Onset   Other Sister        TRAUMA TO HEAD AFTER A FALL    SOCIAL HISTORY: Social History   Socioeconomic History   Marital status: Widowed    Spouse name: Not on file   Number of children: 3   Years of education: Not on file   Highest education level: Not on file  Occupational History   Not on file  Tobacco Use   Smoking status: Former    Packs/day: 1.50    Years: 7.00    Additional pack years: 0.00    Total pack years: 10.50    Types: Cigarettes    Quit date: 24    Years since quitting: 74.5   Smokeless tobacco: Never  Vaping Use   Vaping Use: Never used  Substance and Sexual Activity   Alcohol use: Never   Drug use: Never   Sexual activity: Not Currently  Other Topics Concern   Not on file  Social History Narrative   Daughter Bridget [name] accompanies patient to her appointments.   Social Determinants of Health   Financial Resource Strain: Not on file  Food Insecurity: Not on file  Transportation Needs: Not on file  Physical Activity: Not on file  Stress: Not on file  Social Connections: Not on file  Intimate Partner Violence: Not on file     PHYSICAL EXAM  GENERAL EXAM/CONSTITUTIONAL: Vitals:  Vitals:   06/24/22 1326  BP: 135/74  Pulse: 77  Weight: 125 lb (56.7 kg)  Height: 5\' 5"  (1.651 m)   Body mass index is 20.8 kg/m. Wt Readings from Last 3 Encounters:  06/24/22 125 lb (56.7 kg)   05/08/22 120 lb (54.4 kg)  04/15/22 138 lb 7.2 oz (62.8 kg)   Patient is in no distress; well developed, nourished and groomed; neck is supple  CARDIOVASCULAR: Examination of carotid arteries is normal; no carotid bruits Regular rate and rhythm, no murmurs Examination of peripheral vascular system by observation and palpation is normal  EYES: Ophthalmoscopic exam of optic discs and posterior segments is normal; no papilledema or hemorrhages No results found.  MUSCULOSKELETAL: Gait, strength, tone, movements noted in Neurologic exam below  NEUROLOGIC: MENTAL STATUS:  No data to display         awake, alert, oriented to person DECR FLUENCY AND COMPREHENSION; DECR FUND OF KNOWLEDGE; DECR INSIGHT  CRANIAL NERVE:  2nd - no papilledema on fundoscopic exam 2nd, 3rd, 4th, 6th - pupils equal and reactive to light, visual fields full to confrontation, extraocular muscles intact, no nystagmus 5th - facial sensation symmetric 7th - facial strength --> DECR LEFT LOWER FACIAL STRENGTH 8th - hearing intact 9th - palate elevates symmetrically, uvula midline 11th - shoulder shrug symmetric 12th - tongue protrusion midline  MOTOR:  RUE 3-4; LUE 4 RLE 3; LLE 4  SENSORY:  normal and symmetric to light touch  COORDINATION:  finger-nose-finger, fine finger movements SLOW  REFLEXES:  deep tendon reflexes TRACE and symmetric  GAIT/STATION:  IN WHEELCHAIR; NEEDS ASSISTANCE TO STAND     DIAGNOSTIC DATA (LABS, IMAGING, TESTING) - I reviewed patient records, labs, notes, testing and imaging myself where available.  Lab Results  Component Value Date   WBC 3.8 (L) 03/31/2022   HGB 13.3 03/31/2022   HCT 39.5 03/31/2022   MCV 97.1 03/31/2022   PLT 232 03/31/2022      Component Value Date/Time   NA 135 03/31/2022 0754   NA 137 12/03/2021 1304   K 4.2 03/31/2022 0754   CL 104 03/31/2022 0754   CO2 21 (L) 03/31/2022 0754   GLUCOSE 179 (H) 03/31/2022 0754   BUN 14  03/31/2022 0754   BUN 15 12/03/2021 1304   CREATININE 0.78 03/31/2022 0754   CALCIUM 8.9 03/31/2022 0754   PROT 5.7 (L) 03/21/2022 0716   PROT 6.0 08/28/2020 1402   ALBUMIN 3.0 (L) 03/21/2022 0716   ALBUMIN 3.8 08/28/2020 1402   AST 19 03/21/2022 0716   ALT 15 03/21/2022 0716   ALKPHOS 50 03/21/2022 0716   BILITOT 0.7 03/21/2022 0716   BILITOT 0.6 08/28/2020 1402   GFRNONAA >60 03/31/2022 0754   Lab Results  Component Value Date   CHOL 252 (H) 03/16/2022   HDL 84 03/16/2022   LDLCALC 154 (H) 03/16/2022   TRIG 68 03/16/2022   CHOLHDL 3.0 03/16/2022   Lab Results  Component Value Date   HGBA1C 5.6 03/16/2022   No results found for: "VITAMINB12" No results found for: "TSH"   03/16/22 MRI brain [I reviewed images myself and agree with interpretation. -VRP]  - Encephalomalacia in the right greater than left frontal lobes, consistent with remote infarcts. Age related cerebral atrophy, with slightly more pronounced atrophy in the bilateral medial temporal lobes. T2 hyperintense signal in the periventricular white matter, likely the sequela of moderate chronic small vessel ischemic disease.  - Acute infarcts in the left posterior frontal cortex, including the precentral gyrus. - Additional smaller subacute infarcts in the more anterior left frontal lobe.   March 2024 HOSPITAL WORKUP Code Stroke CT head No acute abnormality. ASPECTS 10.    CTA head & neck No intracranial large vessel occlusion. Severe stenosis in the left A2 segment. 2. No hemodynamically significant stenosis in the neck. MRI  Acute infarcts in the left posterior frontal cortex, including the precentral gyrus. Additional smaller subacute infarcts in the more anterior left frontal lobe. 2D Echo EF 55 to 60%.  Left atrial size is severely dilated.   LDL 154   ASSESSMENT AND PLAN  89 y.o. year old female here with:  Dx:  1. Cerebrovascular accident (CVA) due to embolism of left middle cerebral artery  (HCC)     PLAN:  Stroke:  acute  infarcts in the left precentral cyrus and left posterior frontal lobe Etiology:  likely embolic in the setting of atrial fibrillation on eliquis   Eliquis (apixaban) daily prior to admission, now on Eliquis (apixaban) daily.  Continue supportive care at home (via daughter and aids)   Atrial Fibrillation LA thrombus Home meds: Eliquis 2.5mg  BID, diltiazem   Hyperlipidemia LDL 154, goal < 70 Continue atorvastatin 80mg   Return for pending if symptoms worsen or fail to improve, return to PCP.    Suanne Marker, MD 06/24/2022, 2:10 PM Certified in Neurology, Neurophysiology and Neuroimaging  [facility] [address], [address] [place], [place] [address] [phone]

## 2022-07-14 ENCOUNTER — Ambulatory Visit (INDEPENDENT_AMBULATORY_CARE_PROVIDER_SITE_OTHER): Payer: Medicare Other | Admitting: Podiatry

## 2022-07-14 ENCOUNTER — Ambulatory Visit: Payer: Medicare Other | Admitting: Podiatry

## 2022-07-14 DIAGNOSIS — Z91199 Patient's noncompliance with other medical treatment and regimen due to unspecified reason: Secondary | ICD-10-CM

## 2022-07-14 NOTE — Progress Notes (Signed)
No show

## 2022-07-24 ENCOUNTER — Ambulatory Visit: Payer: Medicare Other | Admitting: Cardiology

## 2022-08-04 ENCOUNTER — Other Ambulatory Visit: Payer: Self-pay

## 2022-08-07 ENCOUNTER — Ambulatory Visit: Payer: Medicare Other | Admitting: Cardiology

## 2022-08-14 ENCOUNTER — Other Ambulatory Visit: Payer: Self-pay

## 2022-08-14 NOTE — Progress Notes (Unsigned)
  08/14/2022   Bridget [name] is a 89 y.o. female who presents for the GUIDE Program.  Patient Information:  Bridget [name] [address] [place] [address] [email or web address] [phone]  GUIDE Program Information:    Clinical Dementia Rating (CDR) Scale: Clinical Dementia Rating (CDR) Memory: 1  Functional Assessment Staging Tool (FAST) Scale:    PROMIS-10:    Caregiver Information:    Zarit Burden Interview:    In my clinical judgment, the assessed patient meets the [facility] on Aging-Alzheimer's Association diagnostic guidelines for dementia and/or the DSM-5 diagnostic guidelines for major neurocognitive disorder, or I have received a written report (electronic or hard-copy) of a documented dementia diagnosis from another [employer] qualified health professional.  {GUIDE PROGRAM ATTESTATION:29809}     Jonette Mate, RN

## 2022-08-21 ENCOUNTER — Encounter (INDEPENDENT_AMBULATORY_CARE_PROVIDER_SITE_OTHER): Payer: Medicare Other | Admitting: Ophthalmology

## 2022-08-21 DIAGNOSIS — I10 Essential (primary) hypertension: Secondary | ICD-10-CM | POA: Diagnosis not present

## 2022-08-21 DIAGNOSIS — H43813 Vitreous degeneration, bilateral: Secondary | ICD-10-CM | POA: Diagnosis not present

## 2022-08-21 DIAGNOSIS — H35033 Hypertensive retinopathy, bilateral: Secondary | ICD-10-CM | POA: Diagnosis not present

## 2022-08-21 DIAGNOSIS — H353231 Exudative age-related macular degeneration, bilateral, with active choroidal neovascularization: Secondary | ICD-10-CM

## 2022-08-22 ENCOUNTER — Ambulatory Visit: Payer: Medicare Other | Admitting: Cardiology

## 2022-08-25 ENCOUNTER — Other Ambulatory Visit (HOSPITAL_BASED_OUTPATIENT_CLINIC_OR_DEPARTMENT_OTHER): Payer: Self-pay | Admitting: Pulmonary Disease

## 2022-08-25 ENCOUNTER — Telehealth: Payer: Self-pay | Admitting: Pulmonary Disease

## 2022-08-25 NOTE — Telephone Encounter (Signed)
Called and spoke with Pharmacist at [facility] to confirm refills pt does have 5 refills  left on her inhaler . Pt insurance  will not cover this medication due patient being on hospice. Called and inform patient daughter of this, advised her to call the pharmacy regarding this. She said that her insurance should cover this medication

## 2022-08-25 NOTE — Telephone Encounter (Signed)
Pt calling in to get a refill for [facility]    [facility] [address]

## 2022-08-25 NOTE — Telephone Encounter (Signed)
Patients daughter states hospice will not cover Breztri but [employer] should cover [employer]. Per daughter pharmacy tech let her know Dr. Everardo All needs to provide proof as to why she needs this medication. Patients daughter states mother is doing very well with Clinical cytogeneticist.   Patients daughter states even though she has been accepted for Hospice care she could still keep [employer] for all medications Hospice does not cover which includes Breztri.   Can keep her [employer] for all medications including breztri.  Please advise of what steps are next for patient.

## 2022-08-25 NOTE — Telephone Encounter (Signed)
Contacted patient's daughter. She needs proof for [employer]. Recent notes confirm this inhaler is for bronchiectasis management. Reset patient's username and password to access notes to provide proof to hospice. Daughter will let us know if anything additional is needed.  Closing encounter.

## 2022-09-01 NOTE — Telephone Encounter (Signed)
Patient's Bridget [name] is requiring a PA.  Thank you.

## 2022-09-02 ENCOUNTER — Other Ambulatory Visit: Payer: Self-pay

## 2022-09-02 NOTE — Telephone Encounter (Signed)
PA not needed per test claim- last filled 08/22

## 2022-09-15 ENCOUNTER — Ambulatory Visit (INDEPENDENT_AMBULATORY_CARE_PROVIDER_SITE_OTHER): Payer: Medicare Other | Admitting: Podiatry

## 2022-09-15 DIAGNOSIS — B351 Tinea unguium: Secondary | ICD-10-CM | POA: Diagnosis not present

## 2022-09-15 DIAGNOSIS — M79674 Pain in right toe(s): Secondary | ICD-10-CM | POA: Diagnosis not present

## 2022-09-15 DIAGNOSIS — M79675 Pain in left toe(s): Secondary | ICD-10-CM | POA: Diagnosis not present

## 2022-09-15 NOTE — Progress Notes (Signed)
       Subjective:  Patient ID: Bridget [name], female    DOB: 02-Oct-1921,  MRN: [id number]  Patient notes nails are thick, discolored, elongated and painful in shoegear when trying to ambulate.  A caregiver is with her today.  She is in a wheelchair and cannot transfer to exam chair.    PCP is Tisovec, Adelfa Koh, MD.  Past Medical History:  Diagnosis Date   Atherosclerosis of aorta (HCC)    Atrial fibrillation (HCC)    Breast cancer (HCC)    Chronic Bronchitis    Coronary atherosclerosis due to calcified coronary lesion    Nocturnal polyuria    Stroke (HCC)    Thrombus of left atrial appendage     Past Surgical History:  Procedure Laterality Date   BREAST LUMPECTOMY Left 2001   FEMUR FRACTURE SURGERY Right 2019   RIGHT - 2015   TOTAL ABDOMINAL HYSTERECTOMY  1967    Allergies  Allergen Reactions   Pulmicort [Budesonide] Swelling    Eye swelling, ankle and knee swelling   Erythromycin Other (See Comments)    Unknown, but "all the mycins bother me"   Roxicodone [Oxycodone] Other (See Comments)    Confusion    Ultram [Tramadol] Other (See Comments)    Confusion     Review of Systems: Negative except as noted in the HPI.  Objective:  There were no vitals filed for this visit.  Bridget [name] is a pleasant 89 y.o. female in NAD. AAO x 3.  Vascular Examination: Capillary refill time is 3-5 seconds to toes bilateral. Palpable pedal pulses b/l LE. Digital hair present b/l.  Skin temperature gradient WNL b/l. No varicosities b/l. No cyanosis noted b/l.   Dermatological Examination: Pedal skin with normal turgor, texture and tone b/l. No open wounds. No interdigital macerations b/l. Toenails x10 are 3mm thick, discolored, dystrophic with subungual debris. There is pain with compression of the nail plates.  They are elongated x10     Latest Ref Rng & Units 03/16/2022    1:44 [name]  Hemoglobin A1C  Hemoglobin-A1c 4.8 - 5.6 % 5.6    Assessment/Plan: 1. Pain due to onychomycosis  of toenails of both feet     The mycotic toenails were sharply debrided x10 with sterile nail nippers and a power debriding burr to decrease bulk/thickness and length.  The assistant had to smooth her nails for her since I had just sprained my back 20 minutes prior to her arrival and could not bend or twist well.  Apologized for the inconvenience, since she was unable to transfer to exam chair for the care to be performed in a better manner today.    Return in about 3 months (around 12/15/2022) for RFC with Dr. Eloy End .   Clerance Lav, DPM, FACFAS [facility]     [address] [place], [place] [address]                Office [phone]  Fax [phone]

## 2022-11-13 ENCOUNTER — Encounter (INDEPENDENT_AMBULATORY_CARE_PROVIDER_SITE_OTHER): Payer: Medicare Other | Admitting: Ophthalmology

## 2022-11-13 DIAGNOSIS — I10 Essential (primary) hypertension: Secondary | ICD-10-CM | POA: Diagnosis not present

## 2022-11-13 DIAGNOSIS — H35033 Hypertensive retinopathy, bilateral: Secondary | ICD-10-CM

## 2022-11-13 DIAGNOSIS — H43813 Vitreous degeneration, bilateral: Secondary | ICD-10-CM

## 2022-11-13 DIAGNOSIS — H353231 Exudative age-related macular degeneration, bilateral, with active choroidal neovascularization: Secondary | ICD-10-CM | POA: Diagnosis not present

## 2022-11-18 ENCOUNTER — Encounter: Payer: Self-pay | Admitting: Podiatry

## 2022-11-18 ENCOUNTER — Ambulatory Visit (INDEPENDENT_AMBULATORY_CARE_PROVIDER_SITE_OTHER): Payer: Medicare Other | Admitting: Podiatry

## 2022-11-18 DIAGNOSIS — B351 Tinea unguium: Secondary | ICD-10-CM | POA: Diagnosis not present

## 2022-11-18 DIAGNOSIS — M79674 Pain in right toe(s): Secondary | ICD-10-CM | POA: Diagnosis not present

## 2022-11-18 DIAGNOSIS — M79675 Pain in left toe(s): Secondary | ICD-10-CM | POA: Diagnosis not present

## 2022-11-18 DIAGNOSIS — G629 Polyneuropathy, unspecified: Secondary | ICD-10-CM | POA: Diagnosis not present

## 2022-11-23 NOTE — Progress Notes (Signed)
  Subjective:  Patient ID: Bridget [name], female    DOB: 09-15-1921,  MRN: [id number]  Bridget [name] presents to clinic today for at risk foot care with history of peripheral neuropathy and painful thick toenails that are difficult to trim. Pain interferes with ambulation. Aggravating factors include wearing enclosed shoe gear. Pain is relieved with periodic professional debridement.  Chief Complaint  Patient presents with   Routine Post Op    RFC PATIENT SAW HER PCP ABOUT 2 MONTHS AGO    New problem(s): None.  PCP is Tisovec, Adelfa Koh, MD.  Allergies  Allergen Reactions   Cetylpyridinium     Other Reaction(s): Mental Status Changes   Pulmicort [Budesonide] Swelling    Eye swelling, ankle and knee swelling   Erythromycin Other (See Comments)    Unknown, but "all the mycins bother me"   Roxicodone [Oxycodone] Other (See Comments)    Confusion    Ultram [Tramadol] Other (See Comments)    Confusion     Review of Systems: Negative except as noted in the HPI.  Objective: No changes noted in today's physical examination. There were no vitals filed for this visit. Bridget [name] is a pleasant 89 y.o. female frail, in NAD. AAO x 3.  Vascular Examination: Capillary refill time immediate b/l. Vascular status intact b/l with palpable pedal pulses. Pedal hair absent b/l. No pain with calf compression b/l. Skin temperature gradient WNL b/l. No cyanosis or clubbing b/l. No ischemia or gangrene noted b/l.   Neurological Examination: Sensation grossly intact b/l with 10 gram monofilament. Vibratory sensation intact b/l.   Dermatological Examination: Pedal skin thin, shiny and atrophic b/l.  No open wounds. No interdigital macerations.   Toenails 1-5 b/l thick, discolored, elongated with subungual debris and pain on dorsal palpation.   No corns, calluses nor porokeratotic lesions noted.  Musculoskeletal Examination: Noted disuse atrophy bilaterally. No pain, crepitus or joint limitation  noted with ROM bilateral LE. Utilizes wheelchair for mobility assistance.  Radiographs: None  Last A1c:      Latest Ref Rng & Units 03/16/2022    1:44 AM  Hemoglobin A1C  Hemoglobin-A1c 4.8 - 5.6 % 5.6    Assessment/Plan: 1. Pain due to onychomycosis of toenails of both feet   2. Polyneuropathy     -Consent given for treatment as described below: -Examined patient. -Patient to continue soft, supportive shoe gear daily. -Continue supportive shoe gear daily. -Mycotic toenails 1-5 bilaterally were debrided in length and girth with sterile nail nippers and dremel without incident. -Patient/POA to call should there be question/concern in the interim.   Return in about 3 months (around 02/18/2023).  Freddie Breech, DPM      [facility] LOCATION: [address] [place] [address]                   Office [phone]   [facility] LOCATION: [address] [place], [place] [address] Office [phone]

## 2023-01-25 ENCOUNTER — Other Ambulatory Visit: Payer: Self-pay | Admitting: Cardiology

## 2023-02-05 ENCOUNTER — Encounter (INDEPENDENT_AMBULATORY_CARE_PROVIDER_SITE_OTHER): Payer: Medicare Other | Admitting: Ophthalmology

## 2023-02-17 ENCOUNTER — Ambulatory Visit (INDEPENDENT_AMBULATORY_CARE_PROVIDER_SITE_OTHER): Payer: Medicare Other | Admitting: Podiatry

## 2023-02-17 ENCOUNTER — Encounter: Payer: Self-pay | Admitting: Podiatry

## 2023-02-17 DIAGNOSIS — G629 Polyneuropathy, unspecified: Secondary | ICD-10-CM

## 2023-02-17 DIAGNOSIS — M79675 Pain in left toe(s): Secondary | ICD-10-CM | POA: Diagnosis not present

## 2023-02-17 DIAGNOSIS — M79674 Pain in right toe(s): Secondary | ICD-10-CM | POA: Diagnosis not present

## 2023-02-17 DIAGNOSIS — B351 Tinea unguium: Secondary | ICD-10-CM | POA: Diagnosis not present

## 2023-02-24 NOTE — Progress Notes (Signed)
  Subjective:  Patient ID: Bridget [name], female    DOB: [birth year],  MRN: [id number]  Bridget [name] presents to clinic today for: at risk foot care with history of peripheral neuropathy and painful mycotic toenails x 10 which interfere with daily activities. Pain is relieved with periodic professional debridement.  Chief Complaint  Patient presents with   RFC    She is here for a nail trim. PCP is Dr. Wylene Simmer and last seen 3-4 months ago.     PCP is Tisovec, Adelfa Koh, MD.  Allergies  Allergen Reactions   Cetylpyridinium     Other Reaction(s): Mental Status Changes   Pulmicort [Budesonide] Swelling    Eye swelling, ankle and knee swelling   Erythromycin Other (See Comments)    Unknown, but "all the mycins bother me"   Roxicodone [Oxycodone] Other (See Comments)    Confusion    Ultram [Tramadol] Other (See Comments)    Confusion     Review of Systems: Negative except as noted in the HPI.  Objective: No changes noted in today's physical examination. There were no vitals filed for this visit.  Bridget [name] is a pleasant 89 y.o. female WD, WN in NAD. AAO x 3.  Vascular Examination: Capillary refill time <3 seconds b/l LE. Palpable pedal pulses b/l LE. Digital hair present b/l. No pedal edema b/l. Skin temperature gradient WNL b/l. No varicosities b/l. No cyanosis or clubbing noted b/l LE.Marland Kitchen  Dermatological Examination: Pedal skin with normal turgor, texture and tone b/l. No open wounds. No interdigital macerations b/l. Toenails 1-5 b/l thickened, discolored, dystrophic with subungual debris. There is pain on palpation to dorsal aspect of nailplates. No hyperkeratotic nor porokeratotic lesions present on today's visit.Marland Kitchen  Neurological Examination: Pt has subjective symptoms of neuropathy. Protective sensation intact with 10 gram monofilament b/l LE. Vibratory sensation intact b/l LE.   Musculoskeletal Examination: Noted disuse atrophy bilaterally. No pain, crepitus or joint  limitation noted with ROM bilateral LE.     Latest Ref Rng & Units 03/16/2022    1:44 AM  Hemoglobin A1C  Hemoglobin-A1c 4.8 - 5.6 % 5.6    Assessment/Plan: 1. Pain due to onychomycosis of toenails of both feet   2. Polyneuropathy      Patient was evaluated and treated. All patient's and/or POA's questions/concerns addressed on today's visit. Toenails 1-5 debrided in length and girth without incident. Continue soft, supportive shoe gear daily. Report any pedal injuries to medical professional. Call office if there are any questions/concerns. -Patient/POA to call should there be question/concern in the interim.   No follow-ups on file.  Freddie Breech, DPM      [place] LOCATION: [address], [place] [address]                   Office [phone]   [facility] LOCATION: [address] [place], [place] [address] Office [phone]

## 2023-03-20 ENCOUNTER — Other Ambulatory Visit (HOSPITAL_BASED_OUTPATIENT_CLINIC_OR_DEPARTMENT_OTHER): Payer: Self-pay | Admitting: Pulmonary Disease

## 2023-05-07 DEATH — deceased

## 2023-05-20 ENCOUNTER — Ambulatory Visit: Payer: Medicare Other | Admitting: Podiatry

## 2023-06-07 DEATH — deceased

## 2023-08-19 ENCOUNTER — Ambulatory Visit: Payer: Medicare Other | Admitting: Podiatry

## 2023-09-11 IMAGING — CT CT ANGIO CHEST
3 of 6 series · 17 of 46 positions shown · IV contrast (APPLIED)
Comparison: 12/19/2020, 08/17/2020

CLINICAL DATA: Follow-up left atrial appendage thrombus

EXAM:
CT ANGIOGRAPHY CHEST WITH CONTRAST
TECHNIQUE: Multidetector CT imaging of the chest was performed using the
standard protocol during bolus administration of intravenous
contrast. Multiplanar CT image reconstructions and MIPs were
obtained to evaluate the vascular anatomy.

[Series 5: axial arterial · axial · arterial · 0.72mm/px · z∈[-374,-122]mm · 11 of 102 slices shown]
[im 9/102  lung]
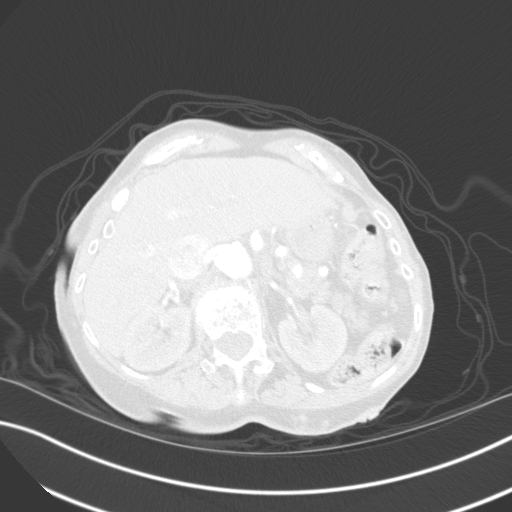
[im 17/102  soft-tissue]
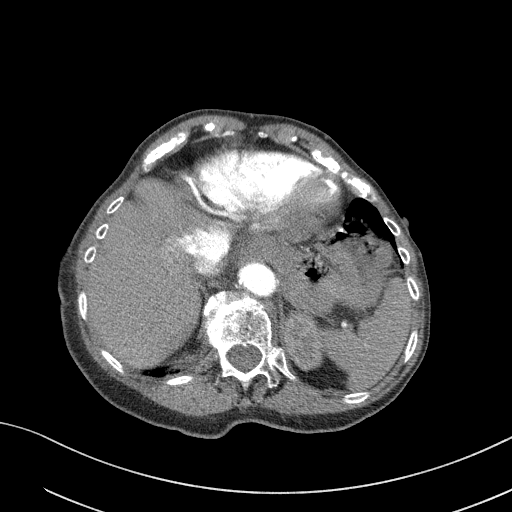
[im 26/102  lung]
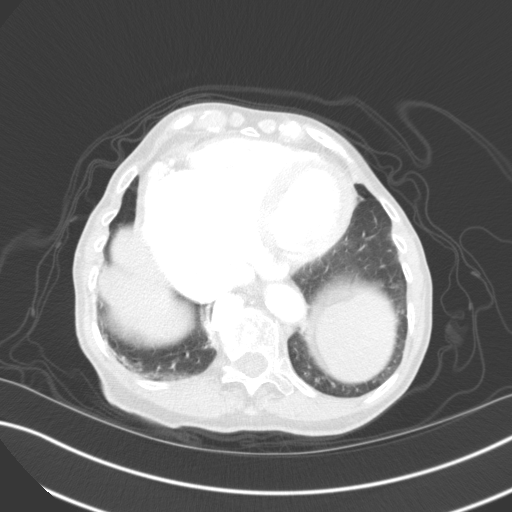
[im 34/102  soft-tissue]
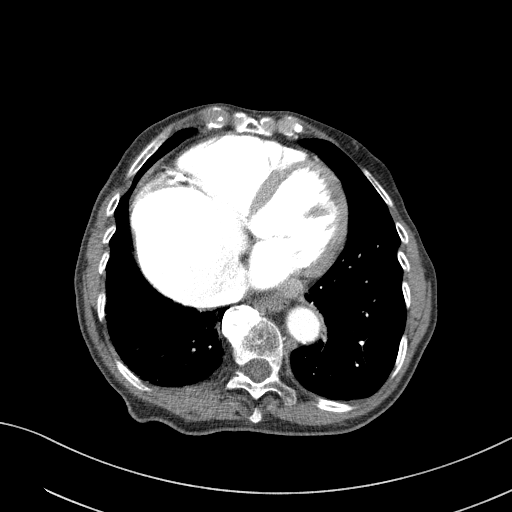
[im 43/102  lung]
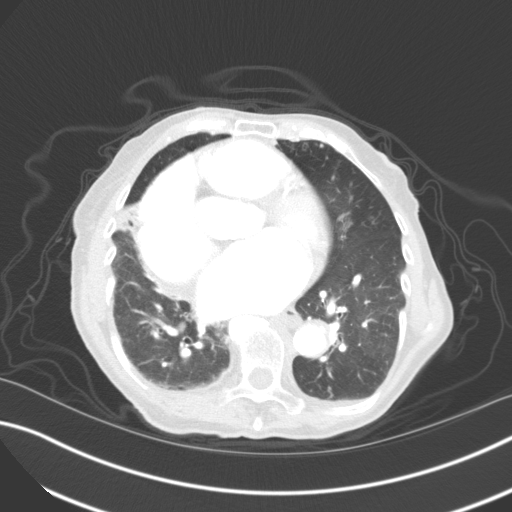
[im 51/102  soft-tissue]
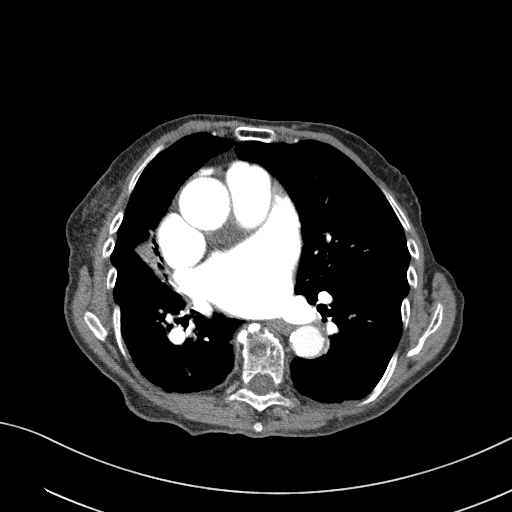
[im 59/102  lung]
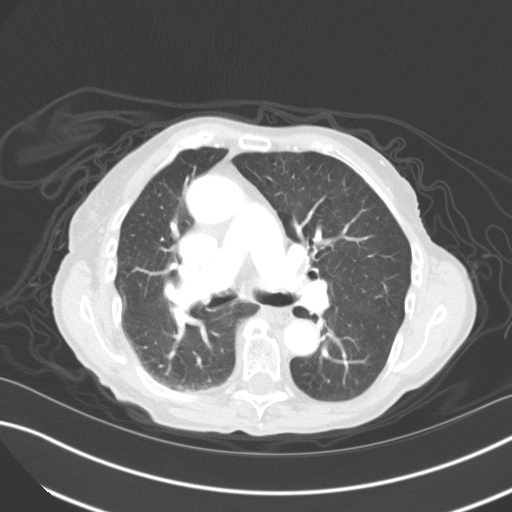
[im 68/102  soft-tissue]
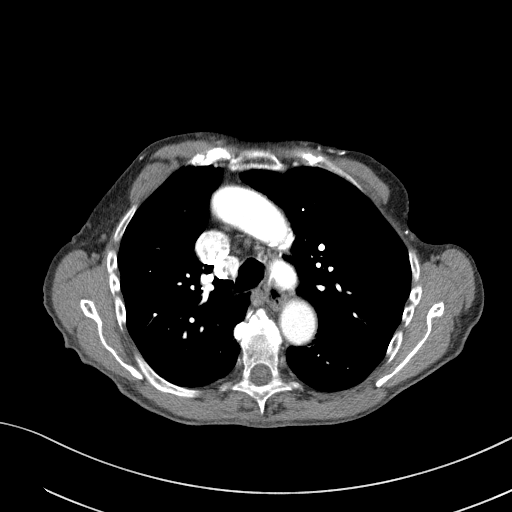
[im 76/102  lung]
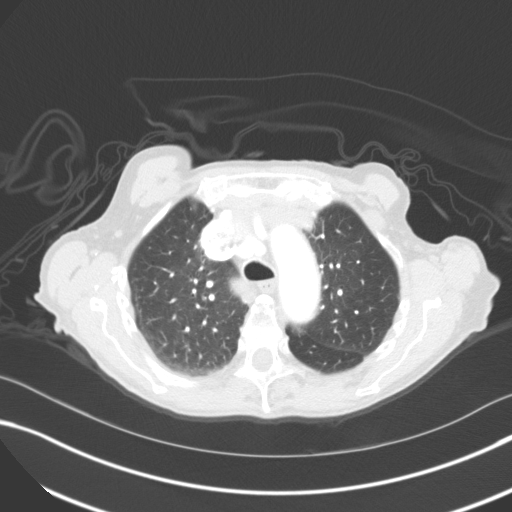
[im 85/102  soft-tissue]
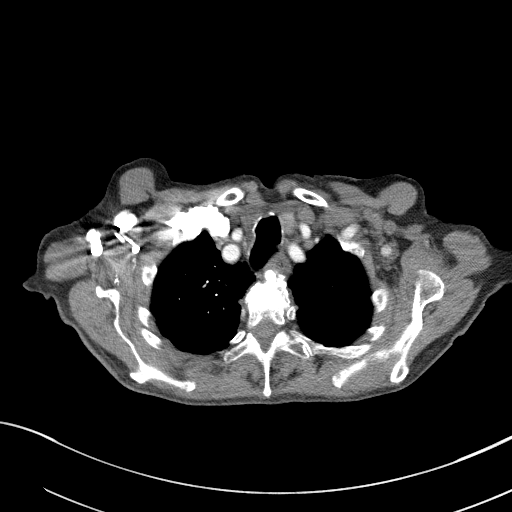
[im 93/102  lung]
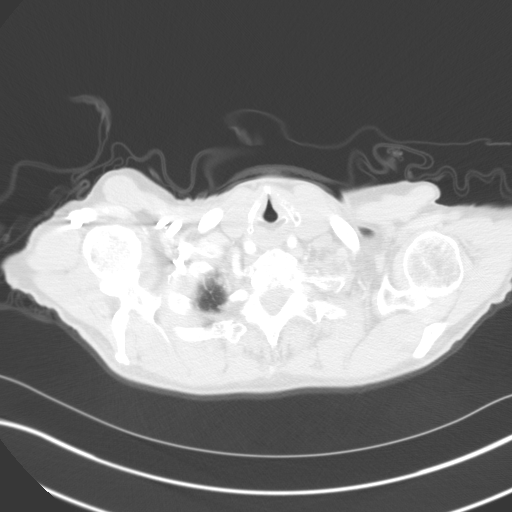

[Series 7: lung · axial · 0.72mm/px · z∈[-366,-298]mm · 3 of 153 slices shown]
[im 17/153  soft-tissue]
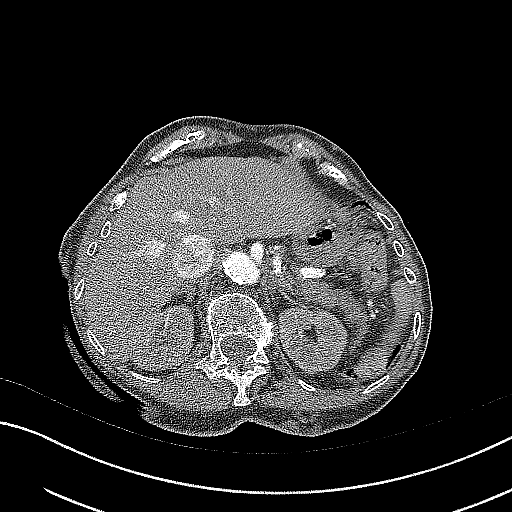
[im 34/153  soft-tissue]
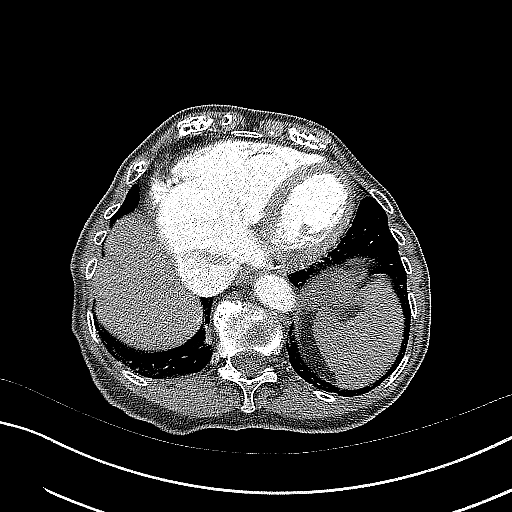
[im 51/153  soft-tissue]
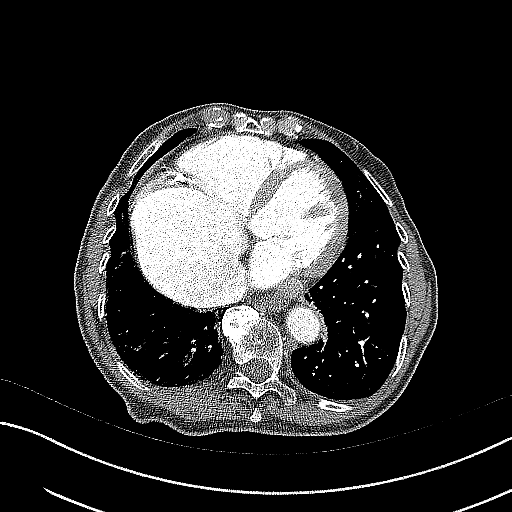

[Series 8: coronal · coronal · 0.56mm/px · 3 of 82 slices shown]
[im 21/82  soft-tissue]
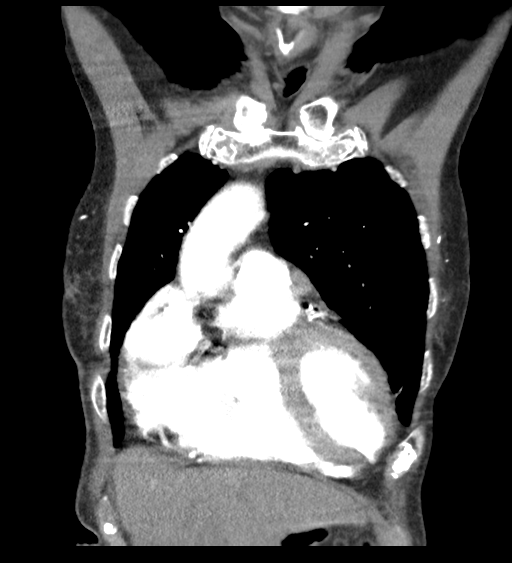
[im 41/82  soft-tissue]
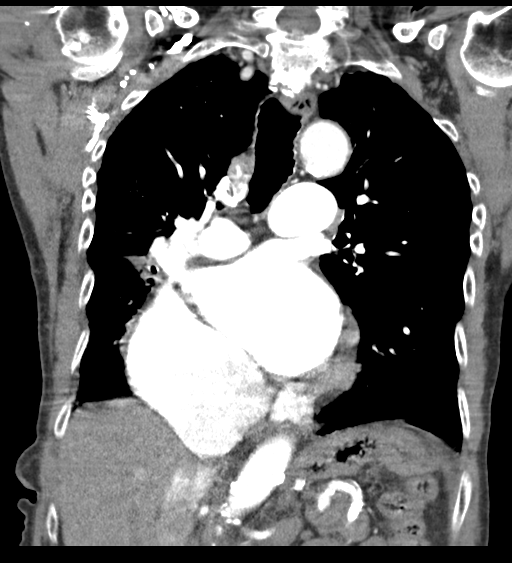
[im 61/82  soft-tissue]
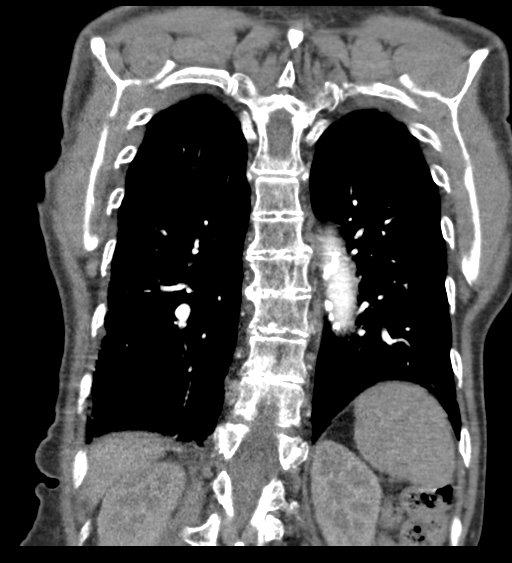

[17 of 46 positions shown; findings below may reference images not displayed]

RADIATION DOSE REDUCTION: This exam was performed according to the
departmental dose-optimization program which includes automated
exposure control, adjustment of the mA and/or kV according to
patient size and/or use of iterative reconstruction technique.

CONTRAST:  80mL OMNIPAQUE IOHEXOL 350 MG/ML SOLN
FINDINGS: Cardiovascular: Satisfactory opacification of the left atrium.
Cardiomegaly with gross enlargement of both the left and right
atria. Apparent thrombus in the tip of the enlarged left atrial
appendage is significantly diminished comparison to contrast
enhanced examination dated 08/17/2020, now measuring 1.3 x 0.9 cm,
previously 3.3 x 2.0 cm (series 5, image 54). Three-vessel coronary
artery calcifications. No pericardial effusion. No evidence of
pulmonary embolism on this non tailored examination.

Mediastinum/Nodes: No enlarged mediastinal, hilar, or axillary lymph
nodes. Thyroid gland, trachea, and esophagus demonstrate no
significant findings.

Lungs/Pleura: Redemonstrated complete fibrosis and atelectasis of
the right middle lobe (series 7, image 82). Previously noted
heterogeneous and ground-glass airspace opacity in the bilateral
upper lobes is almost completely resolved, with minimal residual
opacity present in the left pulmonary apex (series 7, image 27). No
pleural effusion or pneumothorax.

Upper Abdomen: No acute abnormality.

Musculoskeletal: No chest wall abnormality. No acute osseous
findings.

Review of the MIP images confirms the above findings.
IMPRESSION: 1. Satisfactory opacification of the left atrium. Apparent thrombus
in the tip of the enlarged left atrial appendage is significantly
diminished comparison to contrast enhanced examination dated
08/17/2020, consistent with partial resolution. Please note that
cardiac gated CT coronary angiogram, contrast enhanced cardiac MR,
and transesophageal echocardiography are the tests of choice for the
imaging evaluation of left atrial thrombus, particularly in grossly
enlarged left atria in which contrast mixing artifact can
significantly limit assessment.
2. Previously noted heterogeneous and ground-glass airspace opacity
in the bilateral upper lobes is almost completely resolved, with
minimal residual infectious or inflammatory opacity present in the
left pulmonary apex.
3. Redemonstrated complete fibrosis and atelectasis of the right
middle lobe.
4. Cardiomegaly and coronary artery disease.

## 2023-11-18 ENCOUNTER — Ambulatory Visit: Payer: Medicare Other | Admitting: Podiatry
